# Patient Record
Sex: Female | Born: 1980
Health system: Southern US, Community
[De-identification: ages and names within clinical notes are randomized; demographics above are authoritative.]

## PROBLEM LIST (undated history)

## (undated) DIAGNOSIS — N83202 Unspecified ovarian cyst, left side: Secondary | ICD-10-CM

## (undated) DIAGNOSIS — F32A Depression, unspecified: Secondary | ICD-10-CM

## (undated) DIAGNOSIS — F4310 Post-traumatic stress disorder, unspecified: Secondary | ICD-10-CM

## (undated) DIAGNOSIS — R519 Headache, unspecified: Secondary | ICD-10-CM

## (undated) DIAGNOSIS — N83201 Unspecified ovarian cyst, right side: Secondary | ICD-10-CM

## (undated) DIAGNOSIS — G8929 Other chronic pain: Secondary | ICD-10-CM

## (undated) DIAGNOSIS — F329 Major depressive disorder, single episode, unspecified: Secondary | ICD-10-CM

## (undated) DIAGNOSIS — K859 Acute pancreatitis without necrosis or infection, unspecified: Secondary | ICD-10-CM

## (undated) DIAGNOSIS — R51 Headache: Secondary | ICD-10-CM

## (undated) DIAGNOSIS — F419 Anxiety disorder, unspecified: Secondary | ICD-10-CM

## (undated) DIAGNOSIS — J45909 Unspecified asthma, uncomplicated: Secondary | ICD-10-CM

## (undated) DIAGNOSIS — Z973 Presence of spectacles and contact lenses: Secondary | ICD-10-CM

## (undated) DIAGNOSIS — E039 Hypothyroidism, unspecified: Secondary | ICD-10-CM

## (undated) HISTORY — DX: Hypothyroidism, unspecified: E03.9

## (undated) HISTORY — DX: Anxiety disorder, unspecified: F41.9

## (undated) HISTORY — PX: ABDOMINAL SURGERY: SHX537

## (undated) HISTORY — PX: DECUBITUS ULCER EXCISION: SHX1443

## (undated) HISTORY — DX: Unspecified ovarian cyst, right side: N83.202

## (undated) HISTORY — DX: Headache: R51

## (undated) HISTORY — DX: Unspecified ovarian cyst, right side: N83.201

## (undated) HISTORY — DX: Depression, unspecified: F32.A

## (undated) HISTORY — DX: Acute pancreatitis without necrosis or infection, unspecified: K85.90

## (undated) HISTORY — PX: DILATION AND CURETTAGE OF UTERUS: SHX78

## (undated) HISTORY — DX: Other chronic pain: G89.29

## (undated) HISTORY — PX: REVISION GASTRIC RESTRICTIVE PROCEDURE FOR MORBID OBESITY: SUR1273

## (undated) HISTORY — DX: Post-traumatic stress disorder, unspecified: F43.10

## (undated) HISTORY — PX: GASTRIC BYPASS: SHX52

## (undated) HISTORY — PX: CHOLECYSTECTOMY: SHX55

## (undated) HISTORY — DX: Major depressive disorder, single episode, unspecified: F32.9

## (undated) HISTORY — DX: Headache, unspecified: R51.9

---

## 2011-02-18 DIAGNOSIS — R69 Illness, unspecified: Secondary | ICD-10-CM | POA: Insufficient documentation

## 2011-02-18 DIAGNOSIS — Z833 Family history of diabetes mellitus: Secondary | ICD-10-CM | POA: Insufficient documentation

## 2011-02-18 DIAGNOSIS — E668 Other obesity: Secondary | ICD-10-CM | POA: Insufficient documentation

## 2011-02-18 DIAGNOSIS — Z8249 Family history of ischemic heart disease and other diseases of the circulatory system: Secondary | ICD-10-CM | POA: Insufficient documentation

## 2011-02-18 DIAGNOSIS — N949 Unspecified condition associated with female genital organs and menstrual cycle: Secondary | ICD-10-CM | POA: Insufficient documentation

## 2011-02-18 DIAGNOSIS — I10 Essential (primary) hypertension: Secondary | ICD-10-CM | POA: Insufficient documentation

## 2011-02-18 DIAGNOSIS — F17200 Nicotine dependence, unspecified, uncomplicated: Secondary | ICD-10-CM | POA: Insufficient documentation

## 2011-02-18 DIAGNOSIS — Z803 Family history of malignant neoplasm of breast: Secondary | ICD-10-CM | POA: Insufficient documentation

## 2011-02-18 DIAGNOSIS — F32A Depression, unspecified: Secondary | ICD-10-CM | POA: Insufficient documentation

## 2011-02-18 DIAGNOSIS — Z720 Tobacco use: Secondary | ICD-10-CM | POA: Insufficient documentation

## 2011-02-18 DIAGNOSIS — IMO0002 Reserved for concepts with insufficient information to code with codable children: Secondary | ICD-10-CM | POA: Insufficient documentation

## 2011-02-18 DIAGNOSIS — F329 Major depressive disorder, single episode, unspecified: Secondary | ICD-10-CM | POA: Insufficient documentation

## 2011-06-19 DIAGNOSIS — R509 Fever, unspecified: Secondary | ICD-10-CM | POA: Insufficient documentation

## 2011-06-19 DIAGNOSIS — J159 Unspecified bacterial pneumonia: Secondary | ICD-10-CM | POA: Insufficient documentation

## 2011-10-17 DIAGNOSIS — Z9889 Other specified postprocedural states: Secondary | ICD-10-CM | POA: Insufficient documentation

## 2011-12-23 DIAGNOSIS — K859 Acute pancreatitis without necrosis or infection, unspecified: Secondary | ICD-10-CM | POA: Insufficient documentation

## 2012-01-15 DIAGNOSIS — R110 Nausea: Secondary | ICD-10-CM | POA: Insufficient documentation

## 2012-02-18 DIAGNOSIS — R1013 Epigastric pain: Secondary | ICD-10-CM | POA: Insufficient documentation

## 2012-04-06 DIAGNOSIS — F411 Generalized anxiety disorder: Secondary | ICD-10-CM | POA: Insufficient documentation

## 2012-04-06 DIAGNOSIS — Z9889 Other specified postprocedural states: Secondary | ICD-10-CM | POA: Insufficient documentation

## 2012-04-29 DIAGNOSIS — K289 Gastrojejunal ulcer, unspecified as acute or chronic, without hemorrhage or perforation: Secondary | ICD-10-CM | POA: Insufficient documentation

## 2013-10-13 ENCOUNTER — Emergency Department: Payer: Self-pay | Admitting: Emergency Medicine

## 2013-10-13 LAB — CBC
HCT: 34.5 % — ABNORMAL LOW (ref 35.0–47.0)
HGB: 10.9 g/dL — AB (ref 12.0–16.0)
MCH: 21.8 pg — AB (ref 26.0–34.0)
MCHC: 31.6 g/dL — ABNORMAL LOW (ref 32.0–36.0)
MCV: 69 fL — ABNORMAL LOW (ref 80–100)
Platelet: 193 10*3/uL (ref 150–440)
RBC: 5.01 10*6/uL (ref 3.80–5.20)
RDW: 17.2 % — AB (ref 11.5–14.5)
WBC: 6.7 10*3/uL (ref 3.6–11.0)

## 2013-10-13 LAB — URINALYSIS, COMPLETE
BACTERIA: NONE SEEN
Bilirubin,UR: NEGATIVE
Blood: NEGATIVE
GLUCOSE, UR: NEGATIVE mg/dL (ref 0–75)
Leukocyte Esterase: NEGATIVE
Nitrite: NEGATIVE
Ph: 5 (ref 4.5–8.0)
Protein: 30
RBC,UR: 1 /HPF (ref 0–5)
SPECIFIC GRAVITY: 1.035 (ref 1.003–1.030)
Squamous Epithelial: 6

## 2013-10-13 LAB — COMPREHENSIVE METABOLIC PANEL
ALBUMIN: 4.1 g/dL (ref 3.4–5.0)
ALT: 81 U/L — AB (ref 12–78)
AST: 38 U/L — AB (ref 15–37)
Alkaline Phosphatase: 124 U/L — ABNORMAL HIGH
Anion Gap: 5 — ABNORMAL LOW (ref 7–16)
BILIRUBIN TOTAL: 0.4 mg/dL (ref 0.2–1.0)
BUN: 9 mg/dL (ref 7–18)
CHLORIDE: 107 mmol/L (ref 98–107)
CO2: 28 mmol/L (ref 21–32)
Calcium, Total: 8.9 mg/dL (ref 8.5–10.1)
Creatinine: 0.73 mg/dL (ref 0.60–1.30)
EGFR (Non-African Amer.): >60
GLUCOSE: 81 mg/dL (ref 65–99)
Osmolality: 277 (ref 275–301)
POTASSIUM: 4.1 mmol/L (ref 3.5–5.1)
Sodium: 140 mmol/L (ref 136–145)
Total Protein: 7.5 g/dL (ref 6.4–8.2)

## 2013-10-13 LAB — LIPASE, BLOOD: Lipase: 606 U/L — ABNORMAL HIGH (ref 73–393)

## 2013-10-13 LAB — PREGNANCY, URINE: Pregnancy Test, Urine: NEGATIVE m[IU]/mL

## 2013-10-31 ENCOUNTER — Emergency Department: Payer: Self-pay | Admitting: Emergency Medicine

## 2013-10-31 LAB — URINALYSIS, COMPLETE
BACTERIA: NONE SEEN
Bilirubin,UR: NEGATIVE
Blood: NEGATIVE
GLUCOSE, UR: NEGATIVE mg/dL (ref 0–75)
Ketone: NEGATIVE
Leukocyte Esterase: NEGATIVE
Nitrite: NEGATIVE
Ph: 5 (ref 4.5–8.0)
Protein: NEGATIVE
RBC, UR: NONE SEEN /HPF (ref 0–5)
Specific Gravity: 1.017 (ref 1.003–1.030)
Squamous Epithelial: 8
WBC UR: 2 /HPF (ref 0–5)

## 2013-10-31 LAB — COMPREHENSIVE METABOLIC PANEL
ALK PHOS: 88 U/L
ANION GAP: 4 — AB (ref 7–16)
AST: 26 U/L (ref 15–37)
Albumin: 3.9 g/dL (ref 3.4–5.0)
BILIRUBIN TOTAL: 0.3 mg/dL (ref 0.2–1.0)
BUN: 8 mg/dL (ref 7–18)
CALCIUM: 9.1 mg/dL (ref 8.5–10.1)
CHLORIDE: 108 mmol/L — AB (ref 98–107)
CO2: 25 mmol/L (ref 21–32)
Creatinine: 0.84 mg/dL (ref 0.60–1.30)
EGFR (African American): >60
Glucose: 87 mg/dL (ref 65–99)
Osmolality: 272 (ref 275–301)
Potassium: 4.5 mmol/L (ref 3.5–5.1)
SGPT (ALT): 41 U/L (ref 12–78)
Sodium: 137 mmol/L (ref 136–145)
Total Protein: 7.8 g/dL (ref 6.4–8.2)

## 2013-10-31 LAB — CBC WITH DIFFERENTIAL/PLATELET
Basophil #: 0.1 10*3/uL (ref 0.0–0.1)
Basophil %: 1.2 %
Eosinophil #: 0.1 10*3/uL (ref 0.0–0.7)
Eosinophil %: 1.7 %
HCT: 38.6 % (ref 35.0–47.0)
HGB: 12 g/dL (ref 12.0–16.0)
Lymphocyte #: 2.5 10*3/uL (ref 1.0–3.6)
Lymphocyte %: 37 %
MCH: 21.4 pg — ABNORMAL LOW (ref 26.0–34.0)
MCHC: 31.1 g/dL — ABNORMAL LOW (ref 32.0–36.0)
MCV: 69 fL — ABNORMAL LOW (ref 80–100)
Monocyte #: 0.3 x10 3/mm (ref 0.2–0.9)
Monocyte %: 5 %
Neutrophil #: 3.7 10*3/uL (ref 1.4–6.5)
Neutrophil %: 55.1 %
Platelet: 256 10*3/uL (ref 150–440)
RBC: 5.6 10*6/uL — AB (ref 3.80–5.20)
RDW: 16.8 % — AB (ref 11.5–14.5)
WBC: 6.6 10*3/uL (ref 3.6–11.0)

## 2013-10-31 LAB — LIPASE, BLOOD: LIPASE: 434 U/L — AB (ref 73–393)

## 2013-10-31 LAB — HCG, QUANTITATIVE, PREGNANCY: Beta Hcg, Quant.: <1 m[IU]/mL — ABNORMAL LOW

## 2013-11-30 ENCOUNTER — Ambulatory Visit: Payer: Self-pay | Admitting: Gastroenterology

## 2014-02-13 DIAGNOSIS — K30 Functional dyspepsia: Secondary | ICD-10-CM | POA: Insufficient documentation

## 2014-02-13 DIAGNOSIS — R1084 Generalized abdominal pain: Secondary | ICD-10-CM | POA: Diagnosis not present

## 2014-02-13 DIAGNOSIS — R197 Diarrhea, unspecified: Secondary | ICD-10-CM | POA: Diagnosis not present

## 2014-02-13 DIAGNOSIS — K8689 Other specified diseases of pancreas: Secondary | ICD-10-CM | POA: Diagnosis not present

## 2014-02-13 DIAGNOSIS — R109 Unspecified abdominal pain: Secondary | ICD-10-CM

## 2014-02-13 DIAGNOSIS — K3189 Other diseases of stomach and duodenum: Secondary | ICD-10-CM | POA: Diagnosis not present

## 2014-02-13 DIAGNOSIS — D649 Anemia, unspecified: Secondary | ICD-10-CM | POA: Diagnosis not present

## 2014-02-13 DIAGNOSIS — G8929 Other chronic pain: Secondary | ICD-10-CM | POA: Insufficient documentation

## 2014-02-13 DIAGNOSIS — K529 Noninfective gastroenteritis and colitis, unspecified: Secondary | ICD-10-CM | POA: Insufficient documentation

## 2014-02-13 DIAGNOSIS — K869 Disease of pancreas, unspecified: Secondary | ICD-10-CM | POA: Insufficient documentation

## 2014-02-16 DIAGNOSIS — D649 Anemia, unspecified: Secondary | ICD-10-CM | POA: Diagnosis not present

## 2014-02-16 DIAGNOSIS — K8689 Other specified diseases of pancreas: Secondary | ICD-10-CM | POA: Diagnosis not present

## 2014-02-16 DIAGNOSIS — R1084 Generalized abdominal pain: Secondary | ICD-10-CM | POA: Diagnosis not present

## 2014-02-16 DIAGNOSIS — R197 Diarrhea, unspecified: Secondary | ICD-10-CM | POA: Diagnosis not present

## 2014-02-16 DIAGNOSIS — K3189 Other diseases of stomach and duodenum: Secondary | ICD-10-CM | POA: Diagnosis not present

## 2014-02-23 ENCOUNTER — Ambulatory Visit: Payer: Self-pay | Admitting: Gastroenterology

## 2014-02-23 DIAGNOSIS — R197 Diarrhea, unspecified: Secondary | ICD-10-CM | POA: Diagnosis not present

## 2014-02-23 DIAGNOSIS — E278 Other specified disorders of adrenal gland: Secondary | ICD-10-CM | POA: Diagnosis not present

## 2014-02-23 DIAGNOSIS — R109 Unspecified abdominal pain: Secondary | ICD-10-CM | POA: Diagnosis not present

## 2014-02-23 DIAGNOSIS — D3500 Benign neoplasm of unspecified adrenal gland: Secondary | ICD-10-CM | POA: Diagnosis not present

## 2014-02-27 ENCOUNTER — Ambulatory Visit: Payer: Self-pay | Admitting: Gastroenterology

## 2014-02-27 DIAGNOSIS — Z9884 Bariatric surgery status: Secondary | ICD-10-CM | POA: Diagnosis not present

## 2014-02-27 DIAGNOSIS — F341 Dysthymic disorder: Secondary | ICD-10-CM | POA: Diagnosis not present

## 2014-02-27 DIAGNOSIS — K5730 Diverticulosis of large intestine without perforation or abscess without bleeding: Secondary | ICD-10-CM | POA: Diagnosis not present

## 2014-02-27 DIAGNOSIS — R197 Diarrhea, unspecified: Secondary | ICD-10-CM | POA: Diagnosis not present

## 2014-02-27 DIAGNOSIS — K289 Gastrojejunal ulcer, unspecified as acute or chronic, without hemorrhage or perforation: Secondary | ICD-10-CM | POA: Diagnosis not present

## 2014-02-27 DIAGNOSIS — R1084 Generalized abdominal pain: Secondary | ICD-10-CM | POA: Diagnosis not present

## 2014-02-27 DIAGNOSIS — K208 Other esophagitis without bleeding: Secondary | ICD-10-CM | POA: Diagnosis not present

## 2014-02-27 DIAGNOSIS — Z8711 Personal history of peptic ulcer disease: Secondary | ICD-10-CM | POA: Diagnosis not present

## 2014-02-27 DIAGNOSIS — Q438 Other specified congenital malformations of intestine: Secondary | ICD-10-CM | POA: Diagnosis not present

## 2014-02-27 DIAGNOSIS — K209 Esophagitis, unspecified without bleeding: Secondary | ICD-10-CM | POA: Diagnosis not present

## 2014-02-27 DIAGNOSIS — Z79899 Other long term (current) drug therapy: Secondary | ICD-10-CM | POA: Diagnosis not present

## 2014-02-27 DIAGNOSIS — K3189 Other diseases of stomach and duodenum: Secondary | ICD-10-CM | POA: Diagnosis not present

## 2014-02-27 DIAGNOSIS — Z8371 Family history of colonic polyps: Secondary | ICD-10-CM | POA: Diagnosis not present

## 2014-02-27 DIAGNOSIS — Z87891 Personal history of nicotine dependence: Secondary | ICD-10-CM | POA: Diagnosis not present

## 2014-02-27 DIAGNOSIS — Z885 Allergy status to narcotic agent status: Secondary | ICD-10-CM | POA: Diagnosis not present

## 2014-02-27 DIAGNOSIS — R1013 Epigastric pain: Secondary | ICD-10-CM | POA: Diagnosis not present

## 2014-03-01 LAB — PATHOLOGY REPORT

## 2014-03-15 DIAGNOSIS — M549 Dorsalgia, unspecified: Secondary | ICD-10-CM | POA: Diagnosis not present

## 2014-03-15 DIAGNOSIS — K861 Other chronic pancreatitis: Secondary | ICD-10-CM | POA: Diagnosis not present

## 2014-03-15 DIAGNOSIS — F17200 Nicotine dependence, unspecified, uncomplicated: Secondary | ICD-10-CM | POA: Diagnosis not present

## 2014-03-15 DIAGNOSIS — K279 Peptic ulcer, site unspecified, unspecified as acute or chronic, without hemorrhage or perforation: Secondary | ICD-10-CM | POA: Diagnosis not present

## 2014-03-15 DIAGNOSIS — F4310 Post-traumatic stress disorder, unspecified: Secondary | ICD-10-CM | POA: Diagnosis not present

## 2014-03-15 DIAGNOSIS — R5381 Other malaise: Secondary | ICD-10-CM | POA: Diagnosis not present

## 2014-03-15 DIAGNOSIS — R109 Unspecified abdominal pain: Secondary | ICD-10-CM | POA: Diagnosis not present

## 2014-03-15 DIAGNOSIS — R5383 Other fatigue: Secondary | ICD-10-CM | POA: Diagnosis not present

## 2014-03-17 DIAGNOSIS — D518 Other vitamin B12 deficiency anemias: Secondary | ICD-10-CM | POA: Diagnosis not present

## 2014-03-17 DIAGNOSIS — E559 Vitamin D deficiency, unspecified: Secondary | ICD-10-CM | POA: Diagnosis not present

## 2014-03-17 DIAGNOSIS — R5383 Other fatigue: Secondary | ICD-10-CM | POA: Diagnosis not present

## 2014-03-17 DIAGNOSIS — R5381 Other malaise: Secondary | ICD-10-CM | POA: Diagnosis not present

## 2014-03-17 DIAGNOSIS — Z7721 Contact with and (suspected) exposure to potentially hazardous body fluids: Secondary | ICD-10-CM | POA: Diagnosis not present

## 2014-04-10 DIAGNOSIS — Z7721 Contact with and (suspected) exposure to potentially hazardous body fluids: Secondary | ICD-10-CM | POA: Diagnosis not present

## 2014-04-10 DIAGNOSIS — Z0000 Encounter for general adult medical examination without abnormal findings: Secondary | ICD-10-CM | POA: Diagnosis not present

## 2014-04-10 DIAGNOSIS — R5381 Other malaise: Secondary | ICD-10-CM | POA: Diagnosis not present

## 2014-04-10 DIAGNOSIS — F17200 Nicotine dependence, unspecified, uncomplicated: Secondary | ICD-10-CM | POA: Diagnosis not present

## 2014-04-10 DIAGNOSIS — R300 Dysuria: Secondary | ICD-10-CM | POA: Diagnosis not present

## 2014-04-10 DIAGNOSIS — R5383 Other fatigue: Secondary | ICD-10-CM | POA: Diagnosis not present

## 2014-04-10 DIAGNOSIS — F4310 Post-traumatic stress disorder, unspecified: Secondary | ICD-10-CM | POA: Diagnosis not present

## 2014-04-10 DIAGNOSIS — K279 Peptic ulcer, site unspecified, unspecified as acute or chronic, without hemorrhage or perforation: Secondary | ICD-10-CM | POA: Diagnosis not present

## 2014-04-10 DIAGNOSIS — K861 Other chronic pancreatitis: Secondary | ICD-10-CM | POA: Diagnosis not present

## 2014-04-10 DIAGNOSIS — R109 Unspecified abdominal pain: Secondary | ICD-10-CM | POA: Diagnosis not present

## 2014-04-13 DIAGNOSIS — K283 Acute gastrojejunal ulcer without hemorrhage or perforation: Secondary | ICD-10-CM | POA: Diagnosis not present

## 2014-04-19 ENCOUNTER — Ambulatory Visit: Payer: Self-pay | Admitting: Pain Medicine

## 2014-04-19 DIAGNOSIS — IMO0002 Reserved for concepts with insufficient information to code with codable children: Secondary | ICD-10-CM | POA: Diagnosis not present

## 2014-04-19 DIAGNOSIS — R109 Unspecified abdominal pain: Secondary | ICD-10-CM | POA: Diagnosis not present

## 2014-04-19 DIAGNOSIS — M549 Dorsalgia, unspecified: Secondary | ICD-10-CM | POA: Diagnosis not present

## 2014-04-19 DIAGNOSIS — R079 Chest pain, unspecified: Secondary | ICD-10-CM | POA: Diagnosis not present

## 2014-04-19 DIAGNOSIS — Z79899 Other long term (current) drug therapy: Secondary | ICD-10-CM | POA: Diagnosis not present

## 2014-04-19 DIAGNOSIS — G894 Chronic pain syndrome: Secondary | ICD-10-CM | POA: Diagnosis not present

## 2014-04-25 DIAGNOSIS — K283 Acute gastrojejunal ulcer without hemorrhage or perforation: Secondary | ICD-10-CM | POA: Diagnosis not present

## 2014-05-09 DIAGNOSIS — F4310 Post-traumatic stress disorder, unspecified: Secondary | ICD-10-CM | POA: Diagnosis not present

## 2014-05-10 DIAGNOSIS — Z01818 Encounter for other preprocedural examination: Secondary | ICD-10-CM | POA: Diagnosis not present

## 2014-05-17 DIAGNOSIS — Z8711 Personal history of peptic ulcer disease: Secondary | ICD-10-CM | POA: Diagnosis not present

## 2014-05-17 DIAGNOSIS — K259 Gastric ulcer, unspecified as acute or chronic, without hemorrhage or perforation: Secondary | ICD-10-CM | POA: Diagnosis not present

## 2014-05-17 DIAGNOSIS — K922 Gastrointestinal hemorrhage, unspecified: Secondary | ICD-10-CM | POA: Diagnosis not present

## 2014-05-17 DIAGNOSIS — K287 Chronic gastrojejunal ulcer without hemorrhage or perforation: Secondary | ICD-10-CM | POA: Diagnosis not present

## 2014-05-17 DIAGNOSIS — R000 Tachycardia, unspecified: Secondary | ICD-10-CM | POA: Diagnosis not present

## 2014-05-17 DIAGNOSIS — Z9884 Bariatric surgery status: Secondary | ICD-10-CM | POA: Diagnosis not present

## 2014-05-17 DIAGNOSIS — K909 Intestinal malabsorption, unspecified: Secondary | ICD-10-CM | POA: Diagnosis present

## 2014-05-17 DIAGNOSIS — K284 Chronic or unspecified gastrojejunal ulcer with hemorrhage: Secondary | ICD-10-CM | POA: Diagnosis present

## 2014-05-17 DIAGNOSIS — Z9049 Acquired absence of other specified parts of digestive tract: Secondary | ICD-10-CM | POA: Diagnosis present

## 2014-05-17 DIAGNOSIS — Z8701 Personal history of pneumonia (recurrent): Secondary | ICD-10-CM | POA: Diagnosis not present

## 2014-05-17 DIAGNOSIS — K920 Hematemesis: Secondary | ICD-10-CM | POA: Diagnosis not present

## 2014-05-17 DIAGNOSIS — Z818 Family history of other mental and behavioral disorders: Secondary | ICD-10-CM | POA: Diagnosis not present

## 2014-05-17 DIAGNOSIS — Z87891 Personal history of nicotine dependence: Secondary | ICD-10-CM | POA: Diagnosis not present

## 2014-05-17 DIAGNOSIS — F4310 Post-traumatic stress disorder, unspecified: Secondary | ICD-10-CM | POA: Diagnosis present

## 2014-05-17 DIAGNOSIS — D62 Acute posthemorrhagic anemia: Secondary | ICD-10-CM | POA: Diagnosis not present

## 2014-05-17 DIAGNOSIS — D509 Iron deficiency anemia, unspecified: Secondary | ICD-10-CM | POA: Diagnosis not present

## 2014-05-17 DIAGNOSIS — D500 Iron deficiency anemia secondary to blood loss (chronic): Secondary | ICD-10-CM | POA: Diagnosis not present

## 2014-05-17 DIAGNOSIS — K219 Gastro-esophageal reflux disease without esophagitis: Secondary | ICD-10-CM | POA: Diagnosis not present

## 2014-05-17 DIAGNOSIS — K9161 Intraoperative hemorrhage and hematoma of a digestive system organ or structure complicating a digestive sytem procedure: Secondary | ICD-10-CM | POA: Diagnosis not present

## 2014-05-30 DIAGNOSIS — F4310 Post-traumatic stress disorder, unspecified: Secondary | ICD-10-CM | POA: Diagnosis not present

## 2014-06-13 DIAGNOSIS — F4310 Post-traumatic stress disorder, unspecified: Secondary | ICD-10-CM | POA: Diagnosis not present

## 2014-06-22 DIAGNOSIS — Z9884 Bariatric surgery status: Secondary | ICD-10-CM | POA: Diagnosis not present

## 2014-06-23 DIAGNOSIS — R1084 Generalized abdominal pain: Secondary | ICD-10-CM | POA: Diagnosis not present

## 2014-06-23 DIAGNOSIS — K859 Acute pancreatitis, unspecified: Secondary | ICD-10-CM | POA: Diagnosis not present

## 2014-06-23 DIAGNOSIS — R109 Unspecified abdominal pain: Secondary | ICD-10-CM | POA: Diagnosis not present

## 2014-06-23 DIAGNOSIS — F4310 Post-traumatic stress disorder, unspecified: Secondary | ICD-10-CM | POA: Diagnosis present

## 2014-06-23 DIAGNOSIS — K861 Other chronic pancreatitis: Secondary | ICD-10-CM | POA: Diagnosis not present

## 2014-06-23 DIAGNOSIS — R1310 Dysphagia, unspecified: Secondary | ICD-10-CM | POA: Diagnosis not present

## 2014-06-23 DIAGNOSIS — K219 Gastro-esophageal reflux disease without esophagitis: Secondary | ICD-10-CM | POA: Diagnosis present

## 2014-06-23 DIAGNOSIS — Z87891 Personal history of nicotine dependence: Secondary | ICD-10-CM | POA: Diagnosis not present

## 2014-06-23 DIAGNOSIS — R627 Adult failure to thrive: Secondary | ICD-10-CM | POA: Diagnosis present

## 2014-06-23 DIAGNOSIS — Z9884 Bariatric surgery status: Secondary | ICD-10-CM | POA: Diagnosis not present

## 2014-06-23 DIAGNOSIS — Z8701 Personal history of pneumonia (recurrent): Secondary | ICD-10-CM | POA: Diagnosis not present

## 2014-06-23 DIAGNOSIS — Z6820 Body mass index (BMI) 20.0-20.9, adult: Secondary | ICD-10-CM | POA: Diagnosis not present

## 2014-06-23 DIAGNOSIS — Z9049 Acquired absence of other specified parts of digestive tract: Secondary | ICD-10-CM | POA: Diagnosis present

## 2014-06-23 DIAGNOSIS — E860 Dehydration: Secondary | ICD-10-CM | POA: Diagnosis not present

## 2014-06-23 DIAGNOSIS — Z8711 Personal history of peptic ulcer disease: Secondary | ICD-10-CM | POA: Diagnosis not present

## 2014-06-23 DIAGNOSIS — K9589 Other complications of other bariatric procedure: Secondary | ICD-10-CM | POA: Diagnosis present

## 2014-06-23 DIAGNOSIS — R1012 Left upper quadrant pain: Secondary | ICD-10-CM | POA: Diagnosis not present

## 2014-06-23 DIAGNOSIS — R110 Nausea: Secondary | ICD-10-CM | POA: Diagnosis not present

## 2014-06-23 DIAGNOSIS — E43 Unspecified severe protein-calorie malnutrition: Secondary | ICD-10-CM | POA: Diagnosis present

## 2014-06-23 DIAGNOSIS — R197 Diarrhea, unspecified: Secondary | ICD-10-CM | POA: Diagnosis not present

## 2014-06-23 DIAGNOSIS — R1110 Vomiting, unspecified: Secondary | ICD-10-CM | POA: Diagnosis not present

## 2014-06-23 DIAGNOSIS — K9189 Other postprocedural complications and disorders of digestive system: Secondary | ICD-10-CM | POA: Diagnosis not present

## 2014-06-24 DIAGNOSIS — R1310 Dysphagia, unspecified: Secondary | ICD-10-CM | POA: Diagnosis not present

## 2014-07-07 ENCOUNTER — Emergency Department: Payer: Self-pay | Admitting: Emergency Medicine

## 2014-07-07 DIAGNOSIS — Z0389 Encounter for observation for other suspected diseases and conditions ruled out: Secondary | ICD-10-CM | POA: Diagnosis not present

## 2014-07-07 DIAGNOSIS — R1013 Epigastric pain: Secondary | ICD-10-CM | POA: Diagnosis not present

## 2014-07-07 DIAGNOSIS — R109 Unspecified abdominal pain: Secondary | ICD-10-CM | POA: Diagnosis not present

## 2014-07-07 DIAGNOSIS — Z434 Encounter for attention to other artificial openings of digestive tract: Secondary | ICD-10-CM | POA: Diagnosis not present

## 2014-07-07 DIAGNOSIS — F410 Panic disorder [episodic paroxysmal anxiety] without agoraphobia: Secondary | ICD-10-CM | POA: Diagnosis present

## 2014-07-07 DIAGNOSIS — Z87891 Personal history of nicotine dependence: Secondary | ICD-10-CM | POA: Diagnosis not present

## 2014-07-07 DIAGNOSIS — R112 Nausea with vomiting, unspecified: Secondary | ICD-10-CM | POA: Diagnosis not present

## 2014-07-07 DIAGNOSIS — Z8711 Personal history of peptic ulcer disease: Secondary | ICD-10-CM | POA: Diagnosis not present

## 2014-07-07 DIAGNOSIS — Z9884 Bariatric surgery status: Secondary | ICD-10-CM | POA: Diagnosis not present

## 2014-07-07 DIAGNOSIS — K861 Other chronic pancreatitis: Secondary | ICD-10-CM | POA: Diagnosis present

## 2014-07-07 DIAGNOSIS — I82A11 Acute embolism and thrombosis of right axillary vein: Secondary | ICD-10-CM | POA: Diagnosis not present

## 2014-07-07 DIAGNOSIS — D649 Anemia, unspecified: Secondary | ICD-10-CM | POA: Diagnosis not present

## 2014-07-07 DIAGNOSIS — Z9049 Acquired absence of other specified parts of digestive tract: Secondary | ICD-10-CM | POA: Diagnosis present

## 2014-07-07 DIAGNOSIS — K858 Other acute pancreatitis: Secondary | ICD-10-CM | POA: Diagnosis not present

## 2014-07-07 DIAGNOSIS — I82621 Acute embolism and thrombosis of deep veins of right upper extremity: Secondary | ICD-10-CM | POA: Diagnosis present

## 2014-07-07 DIAGNOSIS — R1012 Left upper quadrant pain: Secondary | ICD-10-CM | POA: Diagnosis not present

## 2014-07-07 DIAGNOSIS — E869 Volume depletion, unspecified: Secondary | ICD-10-CM | POA: Diagnosis not present

## 2014-07-07 DIAGNOSIS — E860 Dehydration: Secondary | ICD-10-CM | POA: Diagnosis present

## 2014-07-07 DIAGNOSIS — R197 Diarrhea, unspecified: Secondary | ICD-10-CM | POA: Diagnosis not present

## 2014-07-07 DIAGNOSIS — K859 Acute pancreatitis, unspecified: Secondary | ICD-10-CM | POA: Diagnosis not present

## 2014-07-07 DIAGNOSIS — Z818 Family history of other mental and behavioral disorders: Secondary | ICD-10-CM | POA: Diagnosis not present

## 2014-07-07 DIAGNOSIS — R1110 Vomiting, unspecified: Secondary | ICD-10-CM | POA: Diagnosis not present

## 2014-07-07 DIAGNOSIS — E43 Unspecified severe protein-calorie malnutrition: Secondary | ICD-10-CM | POA: Diagnosis present

## 2014-07-07 DIAGNOSIS — R1084 Generalized abdominal pain: Secondary | ICD-10-CM | POA: Diagnosis not present

## 2014-07-07 DIAGNOSIS — K219 Gastro-esophageal reflux disease without esophagitis: Secondary | ICD-10-CM | POA: Diagnosis present

## 2014-07-07 DIAGNOSIS — Z8701 Personal history of pneumonia (recurrent): Secondary | ICD-10-CM | POA: Diagnosis not present

## 2014-07-07 DIAGNOSIS — F4310 Post-traumatic stress disorder, unspecified: Secondary | ICD-10-CM | POA: Diagnosis present

## 2014-07-07 DIAGNOSIS — R633 Feeding difficulties: Secondary | ICD-10-CM | POA: Diagnosis not present

## 2014-07-07 LAB — CBC WITH DIFFERENTIAL/PLATELET
Basophil #: 0 10*3/uL (ref 0.0–0.1)
Basophil %: 0.4 %
Eosinophil #: 0 10*3/uL (ref 0.0–0.7)
Eosinophil %: 0 %
HCT: 33.2 % — ABNORMAL LOW (ref 35.0–47.0)
HGB: 10.1 g/dL — ABNORMAL LOW (ref 12.0–16.0)
Lymphocyte #: 0.8 10*3/uL — ABNORMAL LOW (ref 1.0–3.6)
Lymphocyte %: 9.1 %
MCH: 19.7 pg — AB (ref 26.0–34.0)
MCHC: 30.5 g/dL — AB (ref 32.0–36.0)
MCV: 64 fL — ABNORMAL LOW (ref 80–100)
Monocyte #: 0.2 x10 3/mm (ref 0.2–0.9)
Monocyte %: 2.2 %
NEUTROS PCT: 88.3 %
Neutrophil #: 8.2 10*3/uL — ABNORMAL HIGH (ref 1.4–6.5)
Platelet: 249 10*3/uL (ref 150–440)
RBC: 5.15 10*6/uL (ref 3.80–5.20)
RDW: 22 % — AB (ref 11.5–14.5)
WBC: 9.2 10*3/uL (ref 3.6–11.0)

## 2014-07-07 LAB — COMPREHENSIVE METABOLIC PANEL
Albumin: 3.9 g/dL (ref 3.4–5.0)
Alkaline Phosphatase: 113 U/L
Anion Gap: 8 (ref 7–16)
BILIRUBIN TOTAL: 0.5 mg/dL (ref 0.2–1.0)
BUN: 10 mg/dL (ref 7–18)
CALCIUM: 9.5 mg/dL (ref 8.5–10.1)
CO2: 28 mmol/L (ref 21–32)
Chloride: 102 mmol/L (ref 98–107)
Creatinine: 0.72 mg/dL (ref 0.60–1.30)
EGFR (African American): >60
EGFR (Non-African Amer.): >60
Glucose: 156 mg/dL — ABNORMAL HIGH (ref 65–99)
Osmolality: 278 (ref 275–301)
POTASSIUM: 3.6 mmol/L (ref 3.5–5.1)
SGOT(AST): 13 U/L — ABNORMAL LOW (ref 15–37)
SGPT (ALT): 34 U/L
SODIUM: 138 mmol/L (ref 136–145)
Total Protein: 8.4 g/dL — ABNORMAL HIGH (ref 6.4–8.2)

## 2014-07-07 LAB — MAGNESIUM: Magnesium: 1.9 mg/dL

## 2014-07-07 LAB — LIPASE, BLOOD: Lipase: 2499 U/L — ABNORMAL HIGH (ref 73–393)

## 2014-07-07 LAB — HCG, QUANTITATIVE, PREGNANCY: Beta Hcg, Quant.: <1 m[IU]/mL — ABNORMAL LOW

## 2014-07-11 DIAGNOSIS — I82409 Acute embolism and thrombosis of unspecified deep veins of unspecified lower extremity: Secondary | ICD-10-CM

## 2014-07-11 HISTORY — DX: Acute embolism and thrombosis of unspecified deep veins of unspecified lower extremity: I82.409

## 2014-07-18 DIAGNOSIS — F4310 Post-traumatic stress disorder, unspecified: Secondary | ICD-10-CM | POA: Diagnosis not present

## 2014-07-20 DIAGNOSIS — I82401 Acute embolism and thrombosis of unspecified deep veins of right lower extremity: Secondary | ICD-10-CM | POA: Diagnosis not present

## 2014-07-20 DIAGNOSIS — I82409 Acute embolism and thrombosis of unspecified deep veins of unspecified lower extremity: Secondary | ICD-10-CM | POA: Insufficient documentation

## 2014-07-20 DIAGNOSIS — I82621 Acute embolism and thrombosis of deep veins of right upper extremity: Secondary | ICD-10-CM | POA: Diagnosis not present

## 2014-07-24 DIAGNOSIS — I82621 Acute embolism and thrombosis of deep veins of right upper extremity: Secondary | ICD-10-CM | POA: Diagnosis not present

## 2014-07-24 DIAGNOSIS — I82409 Acute embolism and thrombosis of unspecified deep veins of unspecified lower extremity: Secondary | ICD-10-CM | POA: Diagnosis not present

## 2014-07-27 DIAGNOSIS — I82621 Acute embolism and thrombosis of deep veins of right upper extremity: Secondary | ICD-10-CM | POA: Diagnosis not present

## 2014-07-27 DIAGNOSIS — I82409 Acute embolism and thrombosis of unspecified deep veins of unspecified lower extremity: Secondary | ICD-10-CM | POA: Diagnosis not present

## 2014-08-01 DIAGNOSIS — I82409 Acute embolism and thrombosis of unspecified deep veins of unspecified lower extremity: Secondary | ICD-10-CM | POA: Diagnosis not present

## 2014-08-07 DIAGNOSIS — I82409 Acute embolism and thrombosis of unspecified deep veins of unspecified lower extremity: Secondary | ICD-10-CM | POA: Diagnosis not present

## 2014-09-13 DIAGNOSIS — Z9884 Bariatric surgery status: Secondary | ICD-10-CM | POA: Diagnosis not present

## 2014-09-13 DIAGNOSIS — R1013 Epigastric pain: Secondary | ICD-10-CM | POA: Diagnosis not present

## 2014-09-13 DIAGNOSIS — K9413 Enterostomy malfunction: Secondary | ICD-10-CM | POA: Diagnosis not present

## 2014-09-13 DIAGNOSIS — Z87891 Personal history of nicotine dependence: Secondary | ICD-10-CM | POA: Diagnosis not present

## 2014-09-13 DIAGNOSIS — R112 Nausea with vomiting, unspecified: Secondary | ICD-10-CM | POA: Diagnosis not present

## 2014-09-13 DIAGNOSIS — K9403 Colostomy malfunction: Secondary | ICD-10-CM | POA: Diagnosis not present

## 2014-09-13 DIAGNOSIS — K861 Other chronic pancreatitis: Secondary | ICD-10-CM | POA: Diagnosis not present

## 2014-09-13 DIAGNOSIS — R630 Anorexia: Secondary | ICD-10-CM | POA: Diagnosis not present

## 2014-10-09 DIAGNOSIS — R1013 Epigastric pain: Secondary | ICD-10-CM | POA: Diagnosis not present

## 2014-10-09 DIAGNOSIS — M549 Dorsalgia, unspecified: Secondary | ICD-10-CM | POA: Diagnosis not present

## 2014-10-09 DIAGNOSIS — G894 Chronic pain syndrome: Secondary | ICD-10-CM | POA: Diagnosis not present

## 2014-10-09 DIAGNOSIS — K861 Other chronic pancreatitis: Secondary | ICD-10-CM | POA: Diagnosis not present

## 2014-10-18 DIAGNOSIS — M5417 Radiculopathy, lumbosacral region: Secondary | ICD-10-CM | POA: Diagnosis not present

## 2014-10-18 DIAGNOSIS — K861 Other chronic pancreatitis: Secondary | ICD-10-CM | POA: Diagnosis not present

## 2014-10-18 DIAGNOSIS — M5414 Radiculopathy, thoracic region: Secondary | ICD-10-CM | POA: Diagnosis not present

## 2014-10-18 DIAGNOSIS — R109 Unspecified abdominal pain: Secondary | ICD-10-CM | POA: Diagnosis not present

## 2014-10-18 DIAGNOSIS — G8929 Other chronic pain: Secondary | ICD-10-CM | POA: Diagnosis not present

## 2014-10-19 DIAGNOSIS — K861 Other chronic pancreatitis: Secondary | ICD-10-CM | POA: Diagnosis not present

## 2014-11-23 DIAGNOSIS — R1013 Epigastric pain: Secondary | ICD-10-CM | POA: Diagnosis not present

## 2014-11-23 DIAGNOSIS — R112 Nausea with vomiting, unspecified: Secondary | ICD-10-CM | POA: Diagnosis not present

## 2014-11-29 DIAGNOSIS — M5417 Radiculopathy, lumbosacral region: Secondary | ICD-10-CM | POA: Diagnosis not present

## 2014-11-29 DIAGNOSIS — R109 Unspecified abdominal pain: Secondary | ICD-10-CM | POA: Diagnosis not present

## 2014-11-29 DIAGNOSIS — K861 Other chronic pancreatitis: Secondary | ICD-10-CM | POA: Diagnosis not present

## 2014-11-29 DIAGNOSIS — M5414 Radiculopathy, thoracic region: Secondary | ICD-10-CM | POA: Diagnosis not present

## 2014-11-29 DIAGNOSIS — G8929 Other chronic pain: Secondary | ICD-10-CM | POA: Diagnosis not present

## 2014-12-07 DIAGNOSIS — K219 Gastro-esophageal reflux disease without esophagitis: Secondary | ICD-10-CM | POA: Diagnosis not present

## 2014-12-07 DIAGNOSIS — R110 Nausea: Secondary | ICD-10-CM | POA: Diagnosis not present

## 2014-12-07 DIAGNOSIS — R630 Anorexia: Secondary | ICD-10-CM | POA: Diagnosis not present

## 2014-12-07 DIAGNOSIS — R109 Unspecified abdominal pain: Secondary | ICD-10-CM | POA: Diagnosis not present

## 2014-12-07 DIAGNOSIS — K110 Atrophy of salivary gland: Secondary | ICD-10-CM | POA: Diagnosis not present

## 2014-12-28 DIAGNOSIS — K861 Other chronic pancreatitis: Secondary | ICD-10-CM | POA: Diagnosis not present

## 2015-01-01 DIAGNOSIS — G8929 Other chronic pain: Secondary | ICD-10-CM | POA: Diagnosis not present

## 2015-01-01 DIAGNOSIS — F329 Major depressive disorder, single episode, unspecified: Secondary | ICD-10-CM | POA: Diagnosis not present

## 2015-01-01 DIAGNOSIS — F411 Generalized anxiety disorder: Secondary | ICD-10-CM | POA: Diagnosis not present

## 2015-01-01 DIAGNOSIS — R109 Unspecified abdominal pain: Secondary | ICD-10-CM | POA: Diagnosis not present

## 2015-01-01 DIAGNOSIS — K861 Other chronic pancreatitis: Secondary | ICD-10-CM | POA: Diagnosis not present

## 2015-01-01 DIAGNOSIS — Z9884 Bariatric surgery status: Secondary | ICD-10-CM | POA: Diagnosis not present

## 2015-01-01 DIAGNOSIS — K869 Disease of pancreas, unspecified: Secondary | ICD-10-CM | POA: Diagnosis not present

## 2015-01-01 DIAGNOSIS — K529 Noninfective gastroenteritis and colitis, unspecified: Secondary | ICD-10-CM | POA: Diagnosis not present

## 2015-01-18 DIAGNOSIS — G8929 Other chronic pain: Secondary | ICD-10-CM | POA: Diagnosis not present

## 2015-01-18 DIAGNOSIS — R1013 Epigastric pain: Secondary | ICD-10-CM | POA: Diagnosis not present

## 2015-01-25 DIAGNOSIS — R109 Unspecified abdominal pain: Secondary | ICD-10-CM | POA: Diagnosis not present

## 2015-01-25 DIAGNOSIS — K861 Other chronic pancreatitis: Secondary | ICD-10-CM | POA: Diagnosis not present

## 2015-01-25 DIAGNOSIS — G8929 Other chronic pain: Secondary | ICD-10-CM | POA: Diagnosis not present

## 2015-01-25 DIAGNOSIS — Z79899 Other long term (current) drug therapy: Secondary | ICD-10-CM | POA: Diagnosis not present

## 2015-02-06 DIAGNOSIS — D509 Iron deficiency anemia, unspecified: Secondary | ICD-10-CM | POA: Diagnosis not present

## 2015-02-06 DIAGNOSIS — N921 Excessive and frequent menstruation with irregular cycle: Secondary | ICD-10-CM | POA: Diagnosis not present

## 2015-02-06 DIAGNOSIS — R51 Headache: Secondary | ICD-10-CM | POA: Diagnosis not present

## 2015-02-06 DIAGNOSIS — F331 Major depressive disorder, recurrent, moderate: Secondary | ICD-10-CM | POA: Diagnosis not present

## 2015-02-06 DIAGNOSIS — D259 Leiomyoma of uterus, unspecified: Secondary | ICD-10-CM | POA: Diagnosis not present

## 2015-02-08 DIAGNOSIS — N925 Other specified irregular menstruation: Secondary | ICD-10-CM | POA: Diagnosis not present

## 2015-02-08 DIAGNOSIS — N921 Excessive and frequent menstruation with irregular cycle: Secondary | ICD-10-CM | POA: Diagnosis not present

## 2015-02-08 DIAGNOSIS — R5383 Other fatigue: Secondary | ICD-10-CM | POA: Diagnosis not present

## 2015-03-08 DIAGNOSIS — N921 Excessive and frequent menstruation with irregular cycle: Secondary | ICD-10-CM | POA: Diagnosis not present

## 2015-03-09 DIAGNOSIS — D259 Leiomyoma of uterus, unspecified: Secondary | ICD-10-CM | POA: Diagnosis not present

## 2015-03-09 DIAGNOSIS — N921 Excessive and frequent menstruation with irregular cycle: Secondary | ICD-10-CM | POA: Diagnosis not present

## 2015-03-09 DIAGNOSIS — D509 Iron deficiency anemia, unspecified: Secondary | ICD-10-CM | POA: Diagnosis not present

## 2015-03-09 DIAGNOSIS — R5383 Other fatigue: Secondary | ICD-10-CM | POA: Diagnosis not present

## 2015-03-13 DIAGNOSIS — E278 Other specified disorders of adrenal gland: Secondary | ICD-10-CM | POA: Diagnosis not present

## 2015-03-13 DIAGNOSIS — E221 Hyperprolactinemia: Secondary | ICD-10-CM | POA: Diagnosis not present

## 2015-03-13 DIAGNOSIS — N8320 Unspecified ovarian cysts: Secondary | ICD-10-CM | POA: Diagnosis not present

## 2015-03-13 DIAGNOSIS — D509 Iron deficiency anemia, unspecified: Secondary | ICD-10-CM | POA: Diagnosis not present

## 2015-03-13 DIAGNOSIS — R51 Headache: Secondary | ICD-10-CM | POA: Diagnosis not present

## 2015-03-13 DIAGNOSIS — N921 Excessive and frequent menstruation with irregular cycle: Secondary | ICD-10-CM | POA: Diagnosis not present

## 2015-03-13 DIAGNOSIS — D519 Vitamin B12 deficiency anemia, unspecified: Secondary | ICD-10-CM | POA: Diagnosis not present

## 2015-03-15 ENCOUNTER — Other Ambulatory Visit: Payer: Self-pay | Admitting: Nurse Practitioner

## 2015-03-15 DIAGNOSIS — G8929 Other chronic pain: Secondary | ICD-10-CM | POA: Diagnosis not present

## 2015-03-15 DIAGNOSIS — R1084 Generalized abdominal pain: Secondary | ICD-10-CM

## 2015-03-15 DIAGNOSIS — R1013 Epigastric pain: Secondary | ICD-10-CM | POA: Diagnosis not present

## 2015-03-15 DIAGNOSIS — E221 Hyperprolactinemia: Secondary | ICD-10-CM

## 2015-03-23 ENCOUNTER — Ambulatory Visit
Admission: RE | Admit: 2015-03-23 | Discharge: 2015-03-23 | Disposition: A | Discharge status: Home / Self Care | Payer: Medicare Other | Source: Ambulatory Visit | Attending: Nurse Practitioner | Admitting: Nurse Practitioner

## 2015-03-23 DIAGNOSIS — I771 Stricture of artery: Secondary | ICD-10-CM | POA: Diagnosis not present

## 2015-03-23 DIAGNOSIS — E221 Hyperprolactinemia: Secondary | ICD-10-CM

## 2015-03-23 DIAGNOSIS — R51 Headache: Secondary | ICD-10-CM | POA: Insufficient documentation

## 2015-03-23 DIAGNOSIS — R42 Dizziness and giddiness: Secondary | ICD-10-CM | POA: Diagnosis not present

## 2015-03-23 MED ORDER — GADOBENATE DIMEGLUMINE 529 MG/ML IV SOLN
10.0000 mL | Freq: Once | INTRAVENOUS | Status: AC | PRN
  Administered 2015-03-23: 7 mL via INTRAVENOUS

## 2015-03-27 ENCOUNTER — Encounter: Payer: Self-pay | Admitting: Psychiatry

## 2015-03-27 ENCOUNTER — Ambulatory Visit (INDEPENDENT_AMBULATORY_CARE_PROVIDER_SITE_OTHER): Payer: Medicare Other | Admitting: Psychiatry

## 2015-03-27 ENCOUNTER — Ambulatory Visit
Admission: RE | Admit: 2015-03-27 | Discharge: 2015-03-27 | Disposition: A | Discharge status: Home / Self Care | Payer: Medicare Other | Source: Ambulatory Visit | Attending: Nurse Practitioner | Admitting: Nurse Practitioner

## 2015-03-27 DIAGNOSIS — Z9049 Acquired absence of other specified parts of digestive tract: Secondary | ICD-10-CM | POA: Insufficient documentation

## 2015-03-27 DIAGNOSIS — E278 Other specified disorders of adrenal gland: Secondary | ICD-10-CM | POA: Diagnosis not present

## 2015-03-27 DIAGNOSIS — R1084 Generalized abdominal pain: Secondary | ICD-10-CM | POA: Insufficient documentation

## 2015-03-27 DIAGNOSIS — R911 Solitary pulmonary nodule: Secondary | ICD-10-CM | POA: Diagnosis not present

## 2015-03-27 DIAGNOSIS — F4312 Post-traumatic stress disorder, chronic: Secondary | ICD-10-CM

## 2015-03-27 DIAGNOSIS — K6389 Other specified diseases of intestine: Secondary | ICD-10-CM | POA: Diagnosis not present

## 2015-03-27 DIAGNOSIS — F331 Major depressive disorder, recurrent, moderate: Secondary | ICD-10-CM | POA: Diagnosis not present

## 2015-03-27 MED ORDER — ESCITALOPRAM OXALATE 10 MG PO TABS: 10.0000 mg | ORAL_TABLET | Freq: Every day | ORAL | Status: DC

## 2015-03-27 MED ORDER — BUSPIRONE HCL 10 MG PO TABS: 10.0000 mg | ORAL_TABLET | Freq: Two times a day (BID) | ORAL | Status: DC

## 2015-03-27 MED ORDER — IOHEXOL 300 MG/ML  SOLN
100.0000 mL | Freq: Once | INTRAMUSCULAR | Status: AC | PRN
  Administered 2015-03-27: 100 mL via INTRAVENOUS
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
## 2015-03-29 ENCOUNTER — Encounter: Payer: Self-pay | Admitting: Oncology

## 2015-03-29 ENCOUNTER — Inpatient Hospital Stay: Payer: Medicare Other | Attending: Oncology | Admitting: Oncology

## 2015-03-29 VITALS — BP 131/89 | Wt 159.6 lb

## 2015-03-29 DIAGNOSIS — D509 Iron deficiency anemia, unspecified: Secondary | ICD-10-CM | POA: Insufficient documentation

## 2015-03-29 DIAGNOSIS — F418 Other specified anxiety disorders: Secondary | ICD-10-CM | POA: Diagnosis not present

## 2015-03-29 DIAGNOSIS — Z9884 Bariatric surgery status: Secondary | ICD-10-CM | POA: Diagnosis not present

## 2015-03-29 DIAGNOSIS — Z79899 Other long term (current) drug therapy: Secondary | ICD-10-CM

## 2015-03-29 DIAGNOSIS — E221 Hyperprolactinemia: Secondary | ICD-10-CM

## 2015-03-29 DIAGNOSIS — R5383 Other fatigue: Secondary | ICD-10-CM

## 2015-03-29 DIAGNOSIS — R531 Weakness: Secondary | ICD-10-CM | POA: Insufficient documentation

## 2015-03-29 DIAGNOSIS — N920 Excessive and frequent menstruation with regular cycle: Secondary | ICD-10-CM | POA: Insufficient documentation

## 2015-03-29 DIAGNOSIS — R1012 Left upper quadrant pain: Secondary | ICD-10-CM | POA: Diagnosis not present

## 2015-03-29 DIAGNOSIS — R51 Headache: Secondary | ICD-10-CM | POA: Diagnosis not present

## 2015-03-29 DIAGNOSIS — I771 Stricture of artery: Secondary | ICD-10-CM | POA: Insufficient documentation

## 2015-03-29 DIAGNOSIS — F17210 Nicotine dependence, cigarettes, uncomplicated: Secondary | ICD-10-CM | POA: Diagnosis not present

## 2015-03-30 ENCOUNTER — Ambulatory Visit (INDEPENDENT_AMBULATORY_CARE_PROVIDER_SITE_OTHER): Payer: Medicare Other | Admitting: Obstetrics and Gynecology

## 2015-03-30 ENCOUNTER — Other Ambulatory Visit: Payer: Self-pay | Admitting: Obstetrics and Gynecology

## 2015-03-30 ENCOUNTER — Encounter: Payer: Self-pay | Admitting: Obstetrics and Gynecology

## 2015-03-30 VITALS — BP 136/84 | HR 85 | Ht 72.0 in | Wt 161.8 lb

## 2015-03-30 DIAGNOSIS — N946 Dysmenorrhea, unspecified: Secondary | ICD-10-CM | POA: Diagnosis not present

## 2015-03-30 DIAGNOSIS — N921 Excessive and frequent menstruation with irregular cycle: Secondary | ICD-10-CM | POA: Diagnosis not present

## 2015-03-30 DIAGNOSIS — Z720 Tobacco use: Secondary | ICD-10-CM

## 2015-03-30 DIAGNOSIS — N8320 Unspecified ovarian cysts: Secondary | ICD-10-CM

## 2015-03-30 DIAGNOSIS — R102 Pelvic and perineal pain: Secondary | ICD-10-CM

## 2015-03-30 DIAGNOSIS — N83202 Unspecified ovarian cyst, left side: Secondary | ICD-10-CM

## 2015-03-30 DIAGNOSIS — R1010 Upper abdominal pain, unspecified: Secondary | ICD-10-CM | POA: Diagnosis not present

## 2015-03-30 DIAGNOSIS — F1220 Cannabis dependence, uncomplicated: Secondary | ICD-10-CM | POA: Diagnosis not present

## 2015-03-30 DIAGNOSIS — F1290 Cannabis use, unspecified, uncomplicated: Secondary | ICD-10-CM

## 2015-03-30 MED ORDER — MEDROXYPROGESTERONE ACETATE 150 MG/ML IM SUSP
150.0000 mg | Freq: Once | INTRAMUSCULAR | Status: AC
  Administered 2015-03-30: 150 mg via INTRAMUSCULAR

## 2015-03-30 MED ORDER — MEDROXYPROGESTERONE ACETATE 150 MG/ML IM SUSP: 150.0000 mg | INTRAMUSCULAR | Status: DC

## 2015-03-30 NOTE — Progress Notes (Signed)
Subjective:     Patient ID: Stacy Pineda, female   DOB: [birth year], 34 y.o.   MRN: [id number]  HPI Referred by PCP for follow-up and evaluation of left ovarian cyst found on CT scan and pelvic pain with menorrhagia and irregular cycles.  Review of Systems Review of Systems - General ROS: positive for  - fatigue Breast ROS: negative for breast lumps Gastrointestinal ROS: positive for - abdominal pain and gas/bloating Genito-Urinary ROS: positive for - dysmenorrhea, irregular/heavy menses and pelvic pain    Objective:   Physical Exam A&O x4 Well groomed female in mild distress Abdomen soft and nontender with normal BS x 4 quadrants Pelvic exam: normal external genitalia, vulva, vagina, cervix, uterus and adnexa, VULVA: normal appearing vulva with no masses, tenderness or lesions, VAGINA: normal appearing vagina with normal color and discharge, no lesions, CERVIX: normal appearing cervix without discharge or lesions, UTERUS: uterus is normal size, shape, consistency and nontender, ADNEXA: tenderness left, no masses, PAP: Pap smear done today.    Assessment:     Left ovarian cyst Menorrhagia with irregular menses pelvic pain Tobacco abuse     Plan:     Pap obtained- will repeat as findings indicated. Repeat pelvic ultrasound in 5-6 weeks, and to see me at same time.  counseled on all forms of hormonal suppression of ovarian cyst and regulate menstrual bleeding, not a good candidate for estrogen products due to smoking and age >82; Desires Depo- rx given and first injection given today.  RTC 12 weeks for next Depo.  Melody Trudee Kuster, CNM

## 2015-03-30 NOTE — Patient Instructions (Signed)
Thank you for enrolling in [facility]. Please follow the instructions below to securely access your online medical record. MyChart allows you to send messages to your doctor, view your test results, renew your prescriptions, schedule appointments, and more.  How Do I Sign Up? 1. In your Internet browser, go to [email or web address]. 2. Click on the New  User? link in the Sign In box.  3. Enter your MyChart Access Code exactly as it appears below. You will not need to use this code after you have completed the sign-up process. If you do not sign up before the expiration date, you must request a new code. MyChart Access Code: [email or web address] Expires: 05/14/2015  4:31 PM  4. Enter the last four digits of your Social Security Number (xxxx) and Date of Birth (mm/dd/yyyy) as indicated and click Next. You will be taken to the next sign-up page. 5. Create a MyChart ID. This will be your MyChart login ID and cannot be changed, so think of one that is secure and easy to remember. 6. Create a MyChart password. You can change your password at any time. 7. Enter your Password Reset Question and Answer and click Next. This can be used at a later time if you forget your password.  8. Select your communication preference, and if applicable enter your e-mail address. You will receive e-mail notification when new information is available in MyChart by choosing to receive e-mail notifications and filling in your e-mail. 9. Click Sign In. You can now view your medical record.   Additional Information If you have questions, you can email [email or web address] or call [phone] to talk to our [facility] staff. Remember, MyChart is NOT to be used for urgent needs. For medical emergencies, dial 911. Ovarian Cyst An ovarian cyst is a fluid-filled sac that forms on an ovary. The ovaries are small organs that produce eggs in women. Various types of cysts can form on the ovaries. Most are not  cancerous. Many do not cause problems, and they often go away on their own. Some may cause symptoms and require treatment. Common types of ovarian cysts include:  Functional cysts--These cysts may occur every month during the menstrual cycle. This is normal. The cysts usually go away with the next menstrual cycle if the woman does not get pregnant. Usually, there are no symptoms with a functional cyst.  Endometrioma cysts--These cysts form from the tissue that lines the uterus. They are also called "chocolate cysts" because they become filled with blood that turns brown. This type of cyst can cause pain in the lower abdomen during intercourse and with your menstrual period.  Cystadenoma cysts--This type develops from the cells on the outside of the ovary. These cysts can get very big and cause lower abdomen pain and pain with intercourse. This type of cyst can twist on itself, cut off its blood supply, and cause severe pain. It can also easily rupture and cause a lot of pain.  Dermoid cysts--This type of cyst is sometimes found in both ovaries. These cysts may contain different kinds of body tissue, such as skin, teeth, hair, or cartilage. They usually do not cause symptoms unless they get very big.  Theca lutein cysts--These cysts occur when too much of a certain hormone (human chorionic gonadotropin) is produced and overstimulates the ovaries to produce an egg. This is most common after procedures used to assist with the conception of a baby (in vitro fertilization). CAUSES   Fertility drugs can cause a  condition in which multiple large cysts are formed on the ovaries. This is called ovarian hyperstimulation syndrome.  A condition called polycystic ovary syndrome can cause hormonal imbalances that can lead to nonfunctional ovarian cysts. SIGNS AND SYMPTOMS  Many ovarian cysts do not cause symptoms. If symptoms are present, they may include:  Pelvic pain or pressure.  Pain in the lower  abdomen.  Pain during sexual intercourse.  Increasing girth (swelling) of the abdomen.  Abnormal menstrual periods.  Increasing pain with menstrual periods.  Stopping having menstrual periods without being pregnant. DIAGNOSIS  These cysts are commonly found during a routine or annual pelvic exam. Tests may be ordered to find out more about the cyst. These tests may include:  Ultrasound.  X-ray of the pelvis.  CT scan.  MRI.  Blood tests. TREATMENT  Many ovarian cysts go away on their own without treatment. Your health care provider may want to check your cyst regularly for 2-3 months to see if it changes. For women in menopause, it is particularly important to monitor a cyst closely because of the higher rate of ovarian cancer in menopausal women. When treatment is needed, it may include any of the following:  A procedure to drain the cyst (aspiration). This may be done using a long needle and ultrasound. It can also be done through a laparoscopic procedure. This involves using a thin, lighted tube with a tiny camera on the end (laparoscope) inserted through a small incision.  Surgery to remove the whole cyst. This may be done using laparoscopic surgery or an open surgery involving a larger incision in the lower abdomen.  Hormone treatment or birth control pills. These methods are sometimes used to help dissolve a cyst. HOME CARE INSTRUCTIONS   Only take over-the-counter or prescription medicines as directed by your health care provider.  Follow up with your health care provider as directed.  Get regular pelvic exams and Pap tests. SEEK MEDICAL CARE IF:   Your periods are late, irregular, or painful, or they stop.  Your pelvic pain or abdominal pain does not go away.  Your abdomen becomes larger or swollen.  You have pressure on your bladder or trouble emptying your bladder completely.  You have pain during sexual intercourse.  You have feelings of fullness,  pressure, or discomfort in your stomach.  You lose weight for no apparent reason.  You feel generally ill.  You become constipated.  You lose your appetite.  You develop acne.  You have an increase in body and facial hair.  You are gaining weight, without changing your exercise and eating habits.  You think you are pregnant. SEEK IMMEDIATE MEDICAL CARE IF:   You have increasing abdominal pain.  You feel sick to your stomach (nauseous), and you throw up (vomit).  You develop a fever that comes on suddenly.  You have abdominal pain during a bowel movement.  Your menstrual periods become heavier than usual. MAKE SURE YOU:  Understand these instructions.  Will watch your condition.  Will get help right away if you are not doing well or get worse. Document Released: 07/28/2005 Document Revised: 08/02/2013 Document Reviewed: 04/04/2013 [facility] Patient Information 2015 [place], [place]. This information is not intended to replace advice given to you by your health care provider. Make sure you discuss any questions you have with your health care provider.    Medroxyprogesterone injection [Contraceptive] What is this medicine? MEDROXYPROGESTERONE (me DROX ee proe JES te rone) contraceptive injections prevent pregnancy. They provide effective birth  control for 3 months. Depo-subQ Provera 104 is also used for treating pain related to endometriosis. This medicine may be used for other purposes; ask your health care provider or pharmacist if you have questions. COMMON BRAND NAME(S): Depo-Provera, Depo-subQ Provera 104 What should I tell my health care provider before I take this medicine? They need to know if you have any of these conditions: -frequently drink alcohol -asthma -blood vessel disease or a history of a blood clot in the lungs or legs -bone disease such as osteoporosis -breast cancer -diabetes -eating disorder (anorexia nervosa or bulimia) -high blood  pressure -HIV infection or AIDS -kidney disease -liver disease -mental depression -migraine -seizures (convulsions) -stroke -tobacco smoker -vaginal bleeding -an unusual or allergic reaction to medroxyprogesterone, other hormones, medicines, foods, dyes, or preservatives -pregnant or trying to get pregnant -breast-feeding How should I use this medicine? Depo-Provera Contraceptive injection is given into a muscle. Depo-subQ Provera 104 injection is given under the skin. These injections are given by a health care professional. You must not be pregnant before getting an injection. The injection is usually given during the first 5 days after the start of a menstrual period or 6 weeks after delivery of a baby. Talk to your pediatrician regarding the use of this medicine in children. Special care may be needed. These injections have been used in female children who have started having menstrual periods. Overdosage: If you think you have taken too much of this medicine contact a poison control center or emergency room at once. NOTE: This medicine is only for you. Do not share this medicine with others. What if I miss a dose? Try not to miss a dose. You must get an injection once every 3 months to maintain birth control. If you cannot keep an appointment, call and reschedule it. If you wait longer than 13 weeks between Depo-Provera contraceptive injections or longer than 14 weeks between Depo-subQ Provera 104 injections, you could get pregnant. Use another method for birth control if you miss your appointment. You may also need a pregnancy test before receiving another injection. What may interact with this medicine? Do not take this medicine with any of the following medications: -bosentan This medicine may also interact with the following medications: -aminoglutethimide -antibiotics or medicines for infections, especially rifampin, rifabutin, rifapentine, and  griseofulvin -aprepitant -barbiturate medicines such as phenobarbital or primidone -bexarotene -carbamazepine -medicines for seizures like ethotoin, felbamate, oxcarbazepine, phenytoin, topiramate -modafinil -St. John's wort This list may not describe all possible interactions. Give your health care provider a list of all the medicines, herbs, non-prescription drugs, or dietary supplements you use. Also tell them if you smoke, drink alcohol, or use illegal drugs. Some items may interact with your medicine. What should I watch for while using this medicine? This drug does not protect you against HIV infection (AIDS) or other sexually transmitted diseases. Use of this product may cause you to lose calcium from your bones. Loss of calcium may cause weak bones (osteoporosis). Only use this product for more than 2 years if other forms of birth control are not right for you. The longer you use this product for birth control the more likely you will be at risk for weak bones. Ask your health care professional how you can keep strong bones. You may have a change in bleeding pattern or irregular periods. Many females stop having periods while taking this drug. If you have received your injections on time, your chance of being pregnant is very low. If you  think you may be pregnant, see your health care professional as soon as possible. Tell your health care professional if you want to get pregnant within the next year. The effect of this medicine may last a long time after you get your last injection. What side effects may I notice from receiving this medicine? Side effects that you should report to your doctor or health care professional as soon as possible: -allergic reactions like skin rash, itching or hives, swelling of the face, lips, or tongue -breast tenderness or discharge -breathing problems -changes in vision -depression -feeling faint or lightheaded, falls -fever -pain in the abdomen, chest,  groin, or leg -problems with balance, talking, walking -unusually weak or tired -yellowing of the eyes or skin Side effects that usually do not require medical attention (report to your doctor or health care professional if they continue or are bothersome): -acne -fluid retention and swelling -headache -irregular periods, spotting, or absent periods -temporary pain, itching, or skin reaction at site where injected -weight gain This list may not describe all possible side effects. Call your doctor for medical advice about side effects. You may report side effects to FDA at 1-800-FDA-1088. Where should I keep my medicine? This does not apply. The injection will be given to you by a health care professional. NOTE: This sheet is a summary. It may not cover all possible information. If you have questions about this medicine, talk to your doctor, pharmacist, or health care provider.  2015, [employer]vier/[employer] Standard. (2008-08-18 18:37:56)

## 2015-04-03 LAB — CYTOLOGY - PAP

## 2015-04-03 NOTE — Progress Notes (Signed)
[place]  [phone] [phone]  ID: Stacy Pineda OB: Aug 13, 1980  MR#: [id number]  YCX#:[id number]  Patient Care Team: Ronnell Freshwater, NP as PCP - General (Family Medicine)  CHIEF COMPLAINT:  Chief Complaint  Patient presents with  . Anemia    INTERVAL HISTORY: Patient is a 34 year old female who complained of heavy extensive menses lasting up to a week and was found to have a decreased hemoglobin and iron stores. She feels weak and fatigued, but otherwise well. She has no neurologic complaints. She has a good appetite and denies weight loss. She denies any fevers. She denies any chest pain or shortness of breath. She denies any nausea, vomiting, constipation, or diarrhea. She has no urinary complaints. Patient otherwise feels well and offers no further specific complaints.  REVIEW OF SYSTEMS:   Review of Systems  Constitutional: Negative for fever.  Respiratory: Negative.   Cardiovascular: Negative.   Gastrointestinal: Negative.   Neurological: Positive for weakness.    As per HPI. Otherwise, a complete review of systems is negatve.  PAST MEDICAL HISTORY: Past Medical History  Diagnosis Date  . Depression   . PTSD (post-traumatic stress disorder)   . Anxiety   . Pancreatitis   . Chronic headaches     PAST SURGICAL HISTORY: Past Surgical History  Procedure Laterality Date  . Abdominal surgery    . Cholecystectomy    . Decubitus ulcer excision    . Gastric bypass      FAMILY HISTORY Family History  Problem Relation Age of Onset  . Graves' disease Mother   . Hypertension Mother   . Hyperlipidemia Mother   . Anxiety disorder Father   . Prostate cancer Father   . Depression Brother   . Anxiety disorder Brother   . Anxiety disorder Maternal Aunt   . Depression Maternal Aunt   . Anxiety disorder Paternal Aunt   . Depression Paternal Aunt   . Anxiety disorder Maternal Uncle   . Depression Maternal Uncle   . Anxiety  disorder Paternal Uncle   . Depression Paternal Uncle   . Anxiety disorder Maternal Grandfather   . Depression Maternal Grandfather   . Anxiety disorder Maternal Grandmother   . Depression Maternal Grandmother        ADVANCED DIRECTIVES:    HEALTH MAINTENANCE: Social History  Substance Use Topics  . Smoking status: Current Some Day Smoker    Types: Cigarettes, Cigars, E-cigarettes    Start date: 03/26/2005  . Smokeless tobacco: Never Used  . Alcohol Use: 0.0 oz/week    0 Standard drinks or equivalent per week     Comment: social     Colonoscopy:  PAP:  Bone density:  Lipid panel:  Allergies  Allergen Reactions  . Fentanyl Itching  . Foeniculum Vulgare   . Morphine And Related Itching    Itchy.   . Vicodin [Hydrocodone-Acetaminophen] Itching    Current Outpatient Prescriptions  Medication Sig Dispense Refill  . busPIRone (BUSPAR) 10 MG tablet Take 1 tablet (10 mg total) by mouth 2 (two) times daily. 60 tablet 1  . butalbital-acetaminophen-caffeine (FIORICET WITH CODEINE) 50-325-40-30 MG per capsule     . escitalopram (LEXAPRO) 10 MG tablet Take 1 tablet (10 mg total) by mouth daily. 30 tablet 1  . ondansetron (ZOFRAN) 4 MG tablet Take 4 mg by mouth.    . pregabalin (LYRICA) 100 MG capsule Take 100 mg by mouth.    . medroxyPROGESTERone (DEPO-PROVERA) 150 MG/ML injection Inject 1 mL (150 mg  total) into the muscle every 3 (three) months. 1 mL 0   No current facility-administered medications for this visit.    OBJECTIVE: Filed Vitals:   03/29/15 1045  BP: 131/89     There is no height on file to calculate BMI.    ECOG FS:0 - Asymptomatic  General: Well-developed, well-nourished, no acute distress. Eyes: Pink conjunctiva, anicteric sclera. HEENT: Normocephalic, moist mucous membranes, clear oropharnyx. Lungs: Clear to auscultation bilaterally. Heart: Regular rate and rhythm. No rubs, murmurs, or gallops. Abdomen: Soft, nontender, nondistended. No organomegaly  noted, normoactive bowel sounds. Musculoskeletal: No edema, cyanosis, or clubbing. Neuro: Alert, answering all questions appropriately. Cranial nerves grossly intact. Skin: No rashes or petechiae noted. Psych: Normal affect. Lymphatics: No cervical, calvicular, axillary or inguinal LAD.   LAB RESULTS:  Lab Results  Component Value Date   NA 138 07/07/2014   K 3.6 07/07/2014   CL 102 07/07/2014   CO2 28 07/07/2014   GLUCOSE 156* 07/07/2014   BUN 10 07/07/2014   CREATININE 0.72 07/07/2014   CALCIUM 9.5 07/07/2014   PROT 8.4* 07/07/2014   ALBUMIN 3.9 07/07/2014   AST 13* 07/07/2014   ALT 34 07/07/2014   ALKPHOS 113 07/07/2014   BILITOT 0.5 07/07/2014   GFRNONAA >60 07/07/2014   GFRAA >60 07/07/2014    Lab Results  Component Value Date   WBC 9.2 07/07/2014   NEUTROABS 8.2* 07/07/2014   HGB 10.1* 07/07/2014   HCT 33.2* 07/07/2014   MCV 64* 07/07/2014   PLT 249 07/07/2014     STUDIES: Mr Jeri Cos Wo Contrast  20-Apr-2015   CLINICAL DATA:  34 year old female with a pulsating headache, dizziness and fatigue. Hyperprolactinemia. Initial encounter.  EXAM: MRI HEAD WITHOUT AND WITH CONTRAST  TECHNIQUE: Multiplanar, multiecho pulse sequences of the brain and surrounding structures were obtained without and with intravenous contrast.  CONTRAST:  75mL MULTIHANCE GADOBENATE DIMEGLUMINE 529 MG/ML IV SOLN  COMPARISON:  None.  FINDINGS: No restricted diffusion to suggest acute infarction. No midline shift, mass effect, evidence of mass lesion, ventriculomegaly, extra-axial collection or acute intracranial hemorrhage. Cervicomedullary junction within normal limits. Cerebral volume is normal.  Major intracranial vascular flow voids are preserved. There is moderate to severe tortuosity of the distal vertebral arteries and vertebrobasilar junction in the ventral basilar cisterns. There is comparatively mild tortuosity of the ICA siphons.  Pearline Cables and white matter signal is within normal limits  throughout the brain. No abnormal gray or white matter enhancement.  Negative visualized cervical spine. Visualized bone marrow signal is within normal limits. Orbits soft tissues appear normal. Visible internal auditory structures appear normal. Trace paranasal sinus mucosal thickening. Mastoids are clear. Scalp soft tissues appear normal.  Dedicated pituitary imaging. Overall pituitary size is normal for age and gender. The infundibulum appears within normal limits. There is no suprasellar mass or mass effect. The cavernous sinuses appear within normal limits. The hypothalamus appears normal. Pre and post-contrast T1 signal within the pituitary gland is homogeneous. No abnormal pituitary enhancement or discrete lesion identified. No abnormal enhancement elsewhere.  IMPRESSION: 1. MRI appearance of the pituitary is within normal limits. 2. Tortuous intracranial arteries, especially the distal vertebral arteries, is uncommon in a patient this age. Query chronic hypertension. 3. Otherwise normal MRI appearance of the brain.   Electronically Signed   By: Genevie Ann M.D.   On: 04/20/2015 16:11   Ct Abdomen Pelvis W Contrast  03/27/2015   CLINICAL DATA:  Diffuse abdominal pain, history of prior gastric bypass, pancreatic stent  placed and removed in 2014, now with gastric sleeve, left upper quadrant pain  EXAM: CT ABDOMEN AND PELVIS WITH CONTRAST  TECHNIQUE: Multidetector CT imaging of the abdomen and pelvis was performed using the standard protocol following bolus administration of intravenous contrast.  CONTRAST:  186mL OMNIPAQUE IOHEXOL 300 MG/ML  SOLN  COMPARISON:  Abdomen films of 07/07/2014 and CT abdomen pelvis of 10/13/2013  FINDINGS: On lung window images, there is a 4 mm noncalcified nodule in the left lower lobe posterior laterally that is not seen with certainty on images through the lung bases on the prior CT. If the patient is considered high risk, then CT of the chest may be warranted.  On soft tissue  window images, the liver enhances with no focal abnormality and no ductal dilatation is seen. Surgical clips are present from prior cholecystectomy. The pancreas is normal in size and the pancreatic duct is not dilated. The right adrenal gland is unremarkable. There is a left adrenal mass of 2.5 x 2.3 cm with attenuation of 29 HU. This was present previously and is unchanged. An unenhanced CT of the abdomen may be helpful to assess the probable benign nature of this left adrenal lesion. The spleen is unremarkable. Multiple sutures and clips are present in the left upper quadrant in this patient who has undergone prior gastric bypass and subsequent gastric sleeve surgery. The kidneys enhance with no calculus or mass and no hydronephrosis is seen. The abdominal aorta is normal in caliber.  The urinary bladder is decompressed. The uterus is normal in size. There is a septated low-attenuation left adrenal lesion most consistent with left ovarian cysts measuring 4.4 cm. No free fluid is seen within the pelvis. However, although the colon is largely decompressed, there is suspicion of diffuse colonic edema and pericolonic strandiness. These findings may indicate diffuse colitis. Clinical correlation is recommended. The lumbar vertebrae are in normal alignment and intervertebral disc spaces appear normal.  IMPRESSION: 1. Although the colon is largely decompressed, there is suspicion of diffuse colonic edema and pericolonic strandiness, which may indicate diffuse colitis. Clinical correlation is recommended. 2. 2.5 cm left adrenal lesion most likely benign, being stable compared to the prior CT from March of 2015. 3. Probable septated left ovarian cyst. Pelvic ultrasound may be helpful if warranted clinically. 4. 4 mm noncalcified left lower lobe lung nodule, not seen on images through the lung bases on the prior CT of 2015. If the patient is considered high risk, CT of the chest would be recommended.   Electronically  Signed   By: Ivar Drape M.D.   On: 03/27/2015 10:02    ASSESSMENT: Iron deficiency anemia.  PLAN:    1. Iron deficiency anemia: Patient's hemoglobin is 9.6. Total iron 21, iron session 5%, ferritin 6. The remainder of her laboratory work was either negative or within normal limits. She will benefit from IV iron. Return to clinic in 1 and then again in 2 weeks to receive 510 mg IV Feraheme. Patient will then return to clinic in 3 months with repeat laboratory work and further evaluation. 2. Heavy menses: Patient was given a referral to gynecology by her primary care physician.   Patient expressed understanding and was in agreement with this plan. She also understands that She can call clinic at any time with any questions, concerns, or complaints.   Lloyd Huger, MD   04/03/2015 8:58 AM

## 2015-04-05 ENCOUNTER — Inpatient Hospital Stay: Payer: Medicare Other

## 2015-04-05 DIAGNOSIS — Z9884 Bariatric surgery status: Secondary | ICD-10-CM | POA: Diagnosis not present

## 2015-04-05 DIAGNOSIS — R51 Headache: Secondary | ICD-10-CM | POA: Diagnosis not present

## 2015-04-05 DIAGNOSIS — N920 Excessive and frequent menstruation with regular cycle: Secondary | ICD-10-CM | POA: Diagnosis not present

## 2015-04-05 DIAGNOSIS — I771 Stricture of artery: Secondary | ICD-10-CM | POA: Diagnosis not present

## 2015-04-05 DIAGNOSIS — D509 Iron deficiency anemia, unspecified: Secondary | ICD-10-CM | POA: Diagnosis not present

## 2015-04-05 DIAGNOSIS — E221 Hyperprolactinemia: Secondary | ICD-10-CM | POA: Diagnosis not present

## 2015-04-05 MED ORDER — FERUMOXYTOL INJECTION 510 MG/17 ML
510.0000 mg | Freq: Once | INTRAVENOUS | Status: AC
  Administered 2015-04-05: 510 mg via INTRAVENOUS
  Filled 2015-04-05: qty 17

## 2015-04-05 MED ORDER — SODIUM CHLORIDE 0.9 % IV SOLN
Freq: Once | INTRAVENOUS | Status: AC
  Administered 2015-04-05: 10 mL/h via INTRAVENOUS
  Filled 2015-04-05: qty 1000

## 2015-04-12 ENCOUNTER — Inpatient Hospital Stay: Payer: Medicare Other | Attending: Oncology

## 2015-04-12 VITALS — BP 116/75 | HR 84 | Resp 20

## 2015-04-12 DIAGNOSIS — Z9884 Bariatric surgery status: Secondary | ICD-10-CM | POA: Diagnosis not present

## 2015-04-12 DIAGNOSIS — N920 Excessive and frequent menstruation with regular cycle: Secondary | ICD-10-CM | POA: Insufficient documentation

## 2015-04-12 DIAGNOSIS — D509 Iron deficiency anemia, unspecified: Secondary | ICD-10-CM

## 2015-04-12 MED ORDER — SODIUM CHLORIDE 0.9 % IV SOLN
Freq: Once | INTRAVENOUS | Status: AC
  Administered 2015-04-12: 10 mL/h via INTRAVENOUS
  Filled 2015-04-12: qty 1000

## 2015-04-12 MED ORDER — SODIUM CHLORIDE 0.9 % IV SOLN
510.0000 mg | Freq: Once | INTRAVENOUS | Status: AC
  Administered 2015-04-12: 510 mg via INTRAVENOUS
  Filled 2015-04-12: qty 17

## 2015-04-17 ENCOUNTER — Other Ambulatory Visit: Payer: Self-pay | Admitting: Nurse Practitioner

## 2015-04-17 DIAGNOSIS — D381 Neoplasm of uncertain behavior of trachea, bronchus and lung: Secondary | ICD-10-CM | POA: Diagnosis not present

## 2015-04-17 DIAGNOSIS — R911 Solitary pulmonary nodule: Secondary | ICD-10-CM

## 2015-04-17 DIAGNOSIS — D509 Iron deficiency anemia, unspecified: Secondary | ICD-10-CM | POA: Diagnosis not present

## 2015-04-17 DIAGNOSIS — R109 Unspecified abdominal pain: Secondary | ICD-10-CM | POA: Diagnosis not present

## 2015-04-17 DIAGNOSIS — N8320 Unspecified ovarian cysts: Secondary | ICD-10-CM | POA: Diagnosis not present

## 2015-04-17 DIAGNOSIS — R51 Headache: Secondary | ICD-10-CM | POA: Diagnosis not present

## 2015-04-17 DIAGNOSIS — E221 Hyperprolactinemia: Secondary | ICD-10-CM | POA: Diagnosis not present

## 2015-04-20 ENCOUNTER — Ambulatory Visit
Admission: RE | Admit: 2015-04-20 | Discharge: 2015-04-20 | Disposition: A | Discharge status: Home / Self Care | Payer: Medicare Other | Source: Ambulatory Visit | Attending: Nurse Practitioner | Admitting: Nurse Practitioner

## 2015-04-20 DIAGNOSIS — R0789 Other chest pain: Secondary | ICD-10-CM | POA: Diagnosis not present

## 2015-04-20 DIAGNOSIS — R911 Solitary pulmonary nodule: Secondary | ICD-10-CM | POA: Diagnosis not present

## 2015-04-20 MED ORDER — IOHEXOL 350 MG/ML SOLN
75.0000 mL | Freq: Once | INTRAVENOUS | Status: AC | PRN
  Administered 2015-04-20: 75 mL via INTRAVENOUS

## 2015-04-25 ENCOUNTER — Encounter: Payer: Self-pay | Admitting: Internal Medicine

## 2015-04-30 ENCOUNTER — Ambulatory Visit: Payer: Self-pay | Admitting: Psychiatry

## 2015-05-03 ENCOUNTER — Ambulatory Visit (INDEPENDENT_AMBULATORY_CARE_PROVIDER_SITE_OTHER): Payer: Medicare Other | Admitting: Psychiatry

## 2015-05-03 ENCOUNTER — Encounter: Payer: Self-pay | Admitting: Psychiatry

## 2015-05-03 VITALS — BP 118/70 | HR 80 | Temp 97.9°F | Ht 72.0 in | Wt 154.4 lb

## 2015-05-03 DIAGNOSIS — F450 Somatization disorder: Secondary | ICD-10-CM | POA: Diagnosis not present

## 2015-05-03 DIAGNOSIS — F331 Major depressive disorder, recurrent, moderate: Secondary | ICD-10-CM

## 2015-05-03 MED ORDER — HYDROXYZINE PAMOATE 50 MG PO CAPS: 50.0000 mg | ORAL_CAPSULE | Freq: Every day | ORAL | Status: DC

## 2015-05-03 MED ORDER — ESCITALOPRAM OXALATE 10 MG PO TABS: 10.0000 mg | ORAL_TABLET | Freq: Every day | ORAL | Status: DC

## 2015-05-03 MED ORDER — BUSPIRONE HCL 15 MG PO TABS: 15.0000 mg | ORAL_TABLET | Freq: Two times a day (BID) | ORAL | Status: DC
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
## 2015-05-07 ENCOUNTER — Other Ambulatory Visit: Payer: Self-pay | Admitting: Internal Medicine

## 2015-05-07 DIAGNOSIS — F17210 Nicotine dependence, cigarettes, uncomplicated: Secondary | ICD-10-CM | POA: Diagnosis not present

## 2015-05-07 DIAGNOSIS — R911 Solitary pulmonary nodule: Secondary | ICD-10-CM

## 2015-05-07 DIAGNOSIS — R0602 Shortness of breath: Secondary | ICD-10-CM | POA: Diagnosis not present

## 2015-05-09 ENCOUNTER — Other Ambulatory Visit: Payer: Self-pay | Admitting: Obstetrics and Gynecology

## 2015-05-09 ENCOUNTER — Ambulatory Visit: Payer: Self-pay | Admitting: Obstetrics and Gynecology

## 2015-05-09 ENCOUNTER — Ambulatory Visit: Payer: Medicare Other

## 2015-05-09 DIAGNOSIS — N8320 Unspecified ovarian cysts: Secondary | ICD-10-CM

## 2015-05-09 DIAGNOSIS — N83202 Unspecified ovarian cyst, left side: Secondary | ICD-10-CM

## 2015-05-14 DIAGNOSIS — F331 Major depressive disorder, recurrent, moderate: Secondary | ICD-10-CM | POA: Diagnosis not present

## 2015-05-14 DIAGNOSIS — F4310 Post-traumatic stress disorder, unspecified: Secondary | ICD-10-CM | POA: Diagnosis not present

## 2015-05-22 DIAGNOSIS — R51 Headache: Secondary | ICD-10-CM | POA: Diagnosis not present

## 2015-05-22 DIAGNOSIS — N921 Excessive and frequent menstruation with irregular cycle: Secondary | ICD-10-CM | POA: Diagnosis not present

## 2015-05-22 DIAGNOSIS — D509 Iron deficiency anemia, unspecified: Secondary | ICD-10-CM | POA: Diagnosis not present

## 2015-05-22 DIAGNOSIS — Z0001 Encounter for general adult medical examination with abnormal findings: Secondary | ICD-10-CM | POA: Diagnosis not present

## 2015-05-22 DIAGNOSIS — F331 Major depressive disorder, recurrent, moderate: Secondary | ICD-10-CM | POA: Diagnosis not present

## 2015-05-22 DIAGNOSIS — E221 Hyperprolactinemia: Secondary | ICD-10-CM | POA: Diagnosis not present

## 2015-06-01 DIAGNOSIS — E538 Deficiency of other specified B group vitamins: Secondary | ICD-10-CM | POA: Diagnosis not present

## 2015-06-01 DIAGNOSIS — K861 Other chronic pancreatitis: Secondary | ICD-10-CM | POA: Diagnosis not present

## 2015-06-01 DIAGNOSIS — E221 Hyperprolactinemia: Secondary | ICD-10-CM | POA: Diagnosis not present

## 2015-06-01 DIAGNOSIS — Z202 Contact with and (suspected) exposure to infections with a predominantly sexual mode of transmission: Secondary | ICD-10-CM | POA: Diagnosis not present

## 2015-06-01 DIAGNOSIS — D509 Iron deficiency anemia, unspecified: Secondary | ICD-10-CM | POA: Diagnosis not present

## 2015-06-01 DIAGNOSIS — T8069XS Other serum reaction due to other serum, sequela: Secondary | ICD-10-CM | POA: Diagnosis not present

## 2015-06-06 DIAGNOSIS — R0602 Shortness of breath: Secondary | ICD-10-CM | POA: Diagnosis not present

## 2015-06-18 DIAGNOSIS — J4530 Mild persistent asthma, uncomplicated: Secondary | ICD-10-CM | POA: Diagnosis not present

## 2015-06-18 DIAGNOSIS — R911 Solitary pulmonary nodule: Secondary | ICD-10-CM | POA: Diagnosis not present

## 2015-06-18 DIAGNOSIS — F17210 Nicotine dependence, cigarettes, uncomplicated: Secondary | ICD-10-CM | POA: Diagnosis not present

## 2015-06-18 DIAGNOSIS — Z23 Encounter for immunization: Secondary | ICD-10-CM | POA: Diagnosis not present

## 2015-06-19 ENCOUNTER — Other Ambulatory Visit: Payer: Self-pay | Admitting: *Deleted

## 2015-06-19 ENCOUNTER — Telehealth: Payer: Self-pay | Admitting: Obstetrics and Gynecology

## 2015-06-19 DIAGNOSIS — K861 Other chronic pancreatitis: Secondary | ICD-10-CM | POA: Diagnosis not present

## 2015-06-19 DIAGNOSIS — R109 Unspecified abdominal pain: Secondary | ICD-10-CM | POA: Diagnosis not present

## 2015-06-19 DIAGNOSIS — G8929 Other chronic pain: Secondary | ICD-10-CM | POA: Diagnosis not present

## 2015-06-19 DIAGNOSIS — R197 Diarrhea, unspecified: Secondary | ICD-10-CM | POA: Diagnosis not present

## 2015-06-19 MED ORDER — MEDROXYPROGESTERONE ACETATE 150 MG/ML IM SUSP: 150.0000 mg | INTRAMUSCULAR | Status: DC

## 2015-06-19 NOTE — Telephone Encounter (Signed)
Done-ac 

## 2015-06-19 NOTE — Telephone Encounter (Signed)
Patient needs a refill on depo sent to [facility] on [address]. She has an appointment tomorrow.Thanks

## 2015-06-20 ENCOUNTER — Ambulatory Visit (INDEPENDENT_AMBULATORY_CARE_PROVIDER_SITE_OTHER): Payer: Medicare Other

## 2015-06-20 VITALS — BP 122/86 | HR 76 | Ht 72.0 in | Wt 156.3 lb

## 2015-06-20 DIAGNOSIS — N921 Excessive and frequent menstruation with irregular cycle: Secondary | ICD-10-CM

## 2015-06-20 MED ORDER — MEDROXYPROGESTERONE ACETATE 150 MG/ML IM SUSP
150.0000 mg | Freq: Once | INTRAMUSCULAR | Status: AC
  Administered 2015-06-20: 150 mg via INTRAMUSCULAR

## 2015-06-20 NOTE — Progress Notes (Signed)
Patient ID: Stacy Pineda, female   DOB: [birth year], 34 y.o.   MRN: [id number] Pt present for Depo-Provera injection for menorrhagia with irreg. Cycle. Pt states although bleeding is not as heavy, it continues and menstral cramps have not improved. Was to have a f/u appt after her ultrasound in Sept but this was canceled by office and was supposed to be called and rescheduled but pt states she did not receive call. Pt to get the next available f/u appt with [facility]. Pt has questions about plan of care.

## 2015-06-22 ENCOUNTER — Ambulatory Visit (INDEPENDENT_AMBULATORY_CARE_PROVIDER_SITE_OTHER): Payer: Medicare Other | Admitting: Obstetrics and Gynecology

## 2015-06-22 ENCOUNTER — Encounter: Payer: Self-pay | Admitting: Obstetrics and Gynecology

## 2015-06-22 VITALS — BP 113/78 | HR 87 | Ht 72.0 in | Wt 159.3 lb

## 2015-06-22 DIAGNOSIS — N921 Excessive and frequent menstruation with irregular cycle: Secondary | ICD-10-CM | POA: Diagnosis not present

## 2015-06-22 DIAGNOSIS — N926 Irregular menstruation, unspecified: Secondary | ICD-10-CM | POA: Diagnosis not present

## 2015-06-22 DIAGNOSIS — R102 Pelvic and perineal pain: Secondary | ICD-10-CM | POA: Diagnosis not present

## 2015-06-22 NOTE — Progress Notes (Signed)
Patient ID: Stacy Pineda, female   DOB: [birth year], 34 y.o.   MRN: [id number]  S: reports continued daily bleeding since first depo injection, but less flow and even spotting most days, cramping has continued at same intensity most of the time. Otherwise happy with depo results.  O: A&Ox4 Well groomed female in no distress Pelvic exam: normal external genitalia, vulva, vagina, cervix, uterus and adnexa.  A: menorrhagia improved with depo, but dysmenorrhea persist  P: desires to continue with depo as had second shot this week, hope for full resolution of symptoms.  RTC prn  Brizza Nathanson Trudee Kuster, CNM

## 2015-06-28 ENCOUNTER — Inpatient Hospital Stay: Payer: Medicare Other

## 2015-06-28 ENCOUNTER — Inpatient Hospital Stay: Payer: Medicare Other | Attending: Oncology

## 2015-06-28 ENCOUNTER — Inpatient Hospital Stay (HOSPITAL_BASED_OUTPATIENT_CLINIC_OR_DEPARTMENT_OTHER): Payer: Medicare Other | Admitting: Oncology

## 2015-06-28 DIAGNOSIS — N920 Excessive and frequent menstruation with regular cycle: Secondary | ICD-10-CM | POA: Insufficient documentation

## 2015-06-28 DIAGNOSIS — F418 Other specified anxiety disorders: Secondary | ICD-10-CM

## 2015-06-28 DIAGNOSIS — F17210 Nicotine dependence, cigarettes, uncomplicated: Secondary | ICD-10-CM

## 2015-06-28 DIAGNOSIS — R51 Headache: Secondary | ICD-10-CM | POA: Diagnosis not present

## 2015-06-28 DIAGNOSIS — E538 Deficiency of other specified B group vitamins: Secondary | ICD-10-CM

## 2015-06-28 DIAGNOSIS — F4310 Post-traumatic stress disorder, unspecified: Secondary | ICD-10-CM | POA: Diagnosis not present

## 2015-06-28 DIAGNOSIS — Z79899 Other long term (current) drug therapy: Secondary | ICD-10-CM | POA: Diagnosis not present

## 2015-06-28 DIAGNOSIS — D509 Iron deficiency anemia, unspecified: Secondary | ICD-10-CM | POA: Diagnosis not present

## 2015-06-28 LAB — CBC
HCT: 32.7 % — ABNORMAL LOW (ref 35.0–47.0)
Hemoglobin: 10.4 g/dL — ABNORMAL LOW (ref 12.0–16.0)
MCH: 22.1 pg — AB (ref 26.0–34.0)
MCHC: 31.9 g/dL — AB (ref 32.0–36.0)
MCV: 69.1 fL — AB (ref 80.0–100.0)
PLATELETS: 242 10*3/uL (ref 150–440)
RBC: 4.74 MIL/uL (ref 3.80–5.20)
RDW: 22.3 % — ABNORMAL HIGH (ref 11.5–14.5)
WBC: 6.7 10*3/uL (ref 3.6–11.0)

## 2015-06-28 LAB — FOLATE: Folate: 3 ng/mL — ABNORMAL LOW (ref >5.9)

## 2015-06-28 LAB — VITAMIN B12: VITAMIN B 12: 154 pg/mL — AB (ref 180–914)

## 2015-06-28 LAB — IRON AND TIBC
Iron: 44 ug/dL (ref 28–170)
SATURATION RATIOS: 14 % (ref 10.4–31.8)
TIBC: 305 ug/dL (ref 250–450)
UIBC: 261 ug/dL

## 2015-06-28 LAB — LACTATE DEHYDROGENASE: LDH: 136 U/L (ref 98–192)

## 2015-06-28 LAB — FERRITIN: FERRITIN: 31 ng/mL (ref 11–307)

## 2015-06-28 LAB — DAT, POLYSPECIFIC AHG (ARMC ONLY): POLYSPECIFIC AHG TEST: NEGATIVE

## 2015-06-29 ENCOUNTER — Encounter: Payer: Self-pay | Admitting: Nurse Practitioner

## 2015-06-29 LAB — HAPTOGLOBIN: Haptoglobin: 168 mg/dL (ref 34–200)

## 2015-06-29 LAB — ANA W/REFLEX: Anti Nuclear Antibody(ANA): NEGATIVE

## 2015-07-10 ENCOUNTER — Encounter: Payer: Self-pay | Admitting: Psychiatry

## 2015-07-10 ENCOUNTER — Ambulatory Visit (INDEPENDENT_AMBULATORY_CARE_PROVIDER_SITE_OTHER): Payer: Medicare Other | Admitting: Psychiatry

## 2015-07-10 VITALS — BP 122/78 | HR 83 | Temp 99.1°F | Ht 72.0 in | Wt 156.2 lb

## 2015-07-10 DIAGNOSIS — F331 Major depressive disorder, recurrent, moderate: Secondary | ICD-10-CM | POA: Diagnosis not present

## 2015-07-10 MED ORDER — ESCITALOPRAM OXALATE 10 MG PO TABS: 10.0000 mg | ORAL_TABLET | Freq: Every day | ORAL | Status: DC

## 2015-07-10 MED ORDER — BUSPIRONE HCL 10 MG PO TABS: 10.0000 mg | ORAL_TABLET | Freq: Two times a day (BID) | ORAL | Status: DC

## 2015-07-10 MED ORDER — AMITRIPTYLINE HCL 25 MG PO TABS: 25.0000 mg | ORAL_TABLET | Freq: Every day | ORAL | Status: DC

## 2015-07-10 NOTE — Progress Notes (Signed)
Psychiatric MD Progress Note  Patient Identification: Stacy Pineda MRN:  [id number] Date of Evaluation:  07/10/2015 Referral Source: [facility]  Chief Complaint:   Chief Complaint    Follow-up; Medication Refill; Medication Problem; Insomnia; Anxiety     Visit Diagnosis:    ICD-9-CM ICD-10-CM   1. MDD (major depressive disorder), recurrent episode, moderate (HCC) 296.32 F33.1    Diagnosis:   Patient Active Problem List   Diagnosis Date Noted  . Iron deficiency anemia [D50.9] 03/29/2015  . Deep vein thrombosis ([place]) [I82.409] 07/20/2014  . Deep vein thrombosis (DVT) ([place]) [I82.409] 07/20/2014  . 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 Chronic abdominal pain [R10.9, G89.29] 02/13/2014  . Chronic diarrhea [K52.9] 02/13/2014  . Acid indigestion [R10.13] 02/13/2014  . Anastomotic ulcer [K28.9] 04/29/2012  . Anxiety state [F41.1] 04/06/2012  . H/O gastrostomy ([place]) [Z93.4] 04/06/2012  . History of gastrostomy ([place]) [Z93.4] 04/06/2012  . Abdominal pain, epigastric [R10.13] 02/18/2012  . Feeling bilious [R11.0] 01/15/2012  . Acute inflammation of the pancreas [K85.9] 12/23/2011  . H/O surgical procedure [Z98.890] 10/17/2011  . History of surgical procedure [Z98.890] 10/17/2011  . Bacterial pneumonia [J15.9] 06/19/2011  . Febrile [R50.9] 06/19/2011  . Clinical depression [F32.9] 02/18/2011  . Disease of female genital organs [N94.9] 02/18/2011  . Essential (primary) hypertension [I10] 02/18/2011  . Family history of cardiovascular disease [Z82.49] 02/18/2011  . Family history of diabetes mellitus [Z83.3] 02/18/2011  . Family history of breast cancer [Z80.3] 02/18/2011  . Morbidity or mortality, unknown cause [R69] 02/18/2011  . Compulsive tobacco user syndrome [F17.200] 02/18/2011  . Extreme obesity ([place]) [E66.01] 02/18/2011  . Morbid obesity ([place]) [E66.01] 02/18/2011  . Current tobacco use [Z72.0] 02/18/2011  . Illness [R69] 02/18/2011   History of  Present Illness:   Pt is a 34 yo homosexual female who has recently relocated to [place] almost a year ago went to [place] to spend time with her family members. Patient reported that she continues to have problems with sleep and is not sleeping well with the help of Vistaril. Patient reported that she smokes marijuana on a regular basis as it helps her relax and has been taking Vistaril but it is not effective. She has tried trazodone in the past but she really hates the medication is makes her too sedated and groggy. She reported that she has also tried Seroquel in the past she does not agree with the medication as well. She reported that she wants to be prescribed "benzodiazepines".  She will also went to the pain clinic but they did not prescribe her any pain medication and located her chart and advised her that she need to discuss her medications with her other practitioners. They continue to give her liver, for chronic pain management. She is also getting Topamax from her general practitioner for chronic migraine headaches. Patient stated that she is getting iron transfusions every 3 weeks due to anemia. She stated that she has poor appetite as she cannot tolerate several different types of food.  She reported that she is trying to eat healthy and feels that she is anxious most of the time She remains focused on her medications during the interview   Past Medical History:  Past Medical History  Diagnosis Date  . Depression   . PTSD (post-traumatic stress disorder)   . Anxiety   . Pancreatitis   . Chronic headaches     Past Surgical History  Procedure Laterality Date  . Abdominal surgery    . Cholecystectomy    .  Decubitus ulcer excision    . Gastric bypass     Family History:  Family History  Problem Relation Age of Onset  . Graves' disease Mother   . Hypertension Mother   . Hyperlipidemia Mother   . Anxiety disorder Father   . Prostate cancer Father   . Depression Brother    . Anxiety disorder Brother   . Anxiety disorder Maternal Aunt   . Depression Maternal Aunt   . Anxiety disorder Paternal Aunt   . Depression Paternal Aunt   . Anxiety disorder Maternal Uncle   . Depression Maternal Uncle   . Anxiety disorder Paternal Uncle   . Depression Paternal Uncle   . Anxiety disorder Maternal Grandfather   . Depression Maternal Grandfather   . Anxiety disorder Maternal Grandmother   . Depression Maternal Grandmother    Social History:   Social History   Social History  . Marital Status: Single    Spouse Name: N/A  . Number of Children: N/A  . Years of Education: N/A   Social History Main Topics  . Smoking status: Current Some Day Smoker    Types: Cigarettes, Cigars, E-cigarettes    Start date: 03/26/2005  . Smokeless tobacco: Never Used  . Alcohol Use: No     Comment: social  . Drug Use: 3.00 per week    Special: Marijuana     Comment: last used about a week ago0  . Sexual Activity: Not Currently   Other Topics Concern  . None   Social History Narrative   Additional Social History:  Patient reported that she has never been married and does not have any children. She dropped out of school and is now trying to finish her classes.  Musculoskeletal: Strength & Muscle Tone: within normal limits Gait & Station: normal Patient leans: N/A  Psychiatric Specialty Exam: Insomnia PMH includes: depression.  Anxiety Symptoms include insomnia.            Associated symptoms include insomnia.  Past medical history includes anxiety.     Review of Systems  Psychiatric/Behavioral: Positive for depression. The patient has insomnia.     Blood pressure 122/78, pulse 83, temperature 99.1 F (37.3 C), temperature source Tympanic, height 6' (1.829 m), weight 156 lb 3.2 oz (70.852 kg), last menstrual period 05/14/2015, SpO2 96 %.Body mass index is 21.18 kg/(m^2).  General Appearance: Casual  Eye Contact:  Fair  Speech:  Clear and Coherent  Volume:   Normal  Mood:  Anxious and Depressed  Affect:  Congruent  Thought Process:  Coherent  Orientation:  Full (Time, Place, and Person)  Thought Content:  WDL  Suicidal Thoughts:  No  Homicidal Thoughts:  No  Memory:  Immediate;   Fair  Judgement:  Fair  Insight:  Fair  Psychomotor Activity:  Decreased  Concentration:  Fair  Recall:  [employer] of Knowledge:Fair  Language: Fair  Akathisia:  No  Handed:  Right  AIMS (if indicated):    Assets:  Communication Skills Desire for Improvement Physical Health Social Support  ADL's:  Intact  Cognition: WNL  Sleep:     Is the patient at risk to self?  No. Has the patient been a risk to self in the past 6 months?  No. Has the patient been a risk to self within the distant past?  No. Is the patient a risk to others?  No. Has the patient been a risk to others in the past 6 months?  No. Has the patient been  a risk to others within the distant past?  No.  Allergies:   Allergies  Allergen Reactions  . Fentanyl Itching  . Foeniculum Vulgare   . Morphine And Related Itching    Itchy.   . Vicodin [Hydrocodone-Acetaminophen] Itching  . Buprenorphine Hcl Itching    Itchy.    Current Medications: Current Outpatient Prescriptions  Medication Sig Dispense Refill  . albuterol (VENTOLIN HFA) 108 (90 BASE) MCG/ACT inhaler     . ASMANEX HFA 100 MCG/ACT AERO     . busPIRone (BUSPAR) 15 MG tablet Take 1 tablet (15 mg total) by mouth 2 (two) times daily. qam and q1 pm 60 tablet 1  . butalbital-acetaminophen-caffeine (FIORICET WITH CODEINE) 50-325-40-30 MG capsule     . CREON 36000 UNITS CPEP capsule     . escitalopram (LEXAPRO) 10 MG tablet     . hydrOXYzine (VISTARIL) 50 MG capsule Take 1 capsule (50 mg total) by mouth at bedtime. 30 capsule 1  . medroxyPROGESTERone (DEPO-PROVERA) 150 MG/ML injection Inject 1 mL (150 mg total) into the muscle every 3 (three) months. 1 mL 3  . ondansetron (ZOFRAN) 4 MG tablet Take 4 mg by mouth.    . pregabalin  (LYRICA) 100 MG capsule Take 100 mg by mouth.    . topiramate (TOPAMAX) 25 MG tablet     . MedroxyPROGESTERone Acetate 150 MG/ML SUSY      No current facility-administered medications for this visit.    Previous Psychotropic Medications:  She has tried Klonopin alprazolam Xanax Prozac Celexa and Abilify trazodone and Latuda in the past.   Substance Abuse History in the last 12 months:  No.  Consequences of Substance Abuse: Negative NA  Medical Decision Making:  Review of Psycho-Social Stressors (1) and Review or order medicine tests (1)  Treatment Plan Summary: Medication management   Anxiety She will continue on buspirone 10mg  BID  Depression She will continue on Lexapro 10 mg daily  Insomnia Discussed with patient about several medication options and she does not want to take trazodone or Seroquel. I advised her about Elavil and discussed with her about the adverse effects including the cardiac side effects in detail and she agreed to a trial of Elavil 25 mg at bedtime. She reported that she does not have any cardiac problems. She continues to smoke marijuana on a daily basis.   Follow-up 2 months or earlier depending on her symptoms  Recommended her for therapy with Elmyra Ricks and she agreed with the plan   .    More than 50% of the time spent in psychoeducation, counseling and coordination of care.  Time spent with the patient 25 minutes   This note was generated in part or whole with voice recognition software. Voice regonition is usually quite accurate but there are transcription errors that can and very often do occur. I apologize for any typographical errors that were not detected and corrected.     Rainey Pines 11/29/20169:44 AM

## 2015-07-11 NOTE — Progress Notes (Signed)
Spoke with Ebony Hail and asked her to discontinue the vistaril hydroxyzine 50mg . Pt states it not working.    The lexapro was refilled on  07-10-15

## 2015-07-13 NOTE — Progress Notes (Signed)
Stacy Pineda  Telephone:([phone]) [phone] Fax:(336) [phone]  ID: Arwilda Martinique OB: 1981-02-22  MR#: [id number]  [id number]  Patient Care Team: Ronnell Freshwater, NP as PCP - General (Family Medicine)  CHIEF COMPLAINT:  Chief Complaint  Patient presents with  . Follow-up    IDA    INTERVAL HISTORY: Patient returns to clinic today for repeat laboratory work and further evaluation. She feels improved since receiving IV Feraheme and does not complain of weakness or fatigue today. She has no neurologic complaints. She has a good appetite and denies weight loss. She denies any fevers. She denies any chest pain or shortness of breath. She denies any nausea, vomiting, constipation, or diarrhea. She has no urinary complaints. Patient offers no specific complaints today.  REVIEW OF SYSTEMS:   Review of Systems  Constitutional: Negative for fever and malaise/fatigue.  Respiratory: Negative.   Cardiovascular: Negative.   Gastrointestinal: Negative.   Musculoskeletal: Negative.   Neurological: Negative.  Negative for weakness.    As per HPI. Otherwise, a complete review of systems is negatve.  PAST MEDICAL HISTORY: Past Medical History  Diagnosis Date  . Depression   . PTSD (post-traumatic stress disorder)   . Anxiety   . Pancreatitis   . Chronic headaches     PAST SURGICAL HISTORY: Past Surgical History  Procedure Laterality Date  . Abdominal surgery    . Cholecystectomy    . Decubitus ulcer excision    . Gastric bypass      FAMILY HISTORY Family History  Problem Relation Age of Onset  . Graves' disease Mother   . Hypertension Mother   . Hyperlipidemia Mother   . Anxiety disorder Father   . Prostate cancer Father   . Depression Brother   . Anxiety disorder Brother   . Anxiety disorder Maternal Aunt   . Depression Maternal Aunt   . Anxiety disorder Paternal Aunt   . Depression Paternal Aunt   . Anxiety disorder Maternal Uncle   . Depression Maternal  Uncle   . Anxiety disorder Paternal Uncle   . Depression Paternal Uncle   . Anxiety disorder Maternal Grandfather   . Depression Maternal Grandfather   . Anxiety disorder Maternal Grandmother   . Depression Maternal Grandmother        ADVANCED DIRECTIVES:    HEALTH MAINTENANCE: Social History  Substance Use Topics  . Smoking status: Current Some Day Smoker    Types: Cigarettes, Cigars, E-cigarettes    Start date: 03/26/2005  . Smokeless tobacco: Never Used  . Alcohol Use: No     Comment: social     Colonoscopy:  PAP:  Bone density:  Lipid panel:  Allergies  Allergen Reactions  . Fentanyl Itching  . Foeniculum Vulgare   . Morphine And Related Itching    Itchy.   . Vicodin [Hydrocodone-Acetaminophen] Itching  . Buprenorphine Hcl Itching    Itchy.     Current Outpatient Prescriptions  Medication Sig Dispense Refill  . albuterol (VENTOLIN HFA) 108 (90 BASE) MCG/ACT inhaler     . ASMANEX HFA 100 MCG/ACT AERO     . butalbital-acetaminophen-caffeine (FIORICET WITH CODEINE) 50-325-40-30 MG capsule     . CREON 36000 UNITS CPEP capsule     . medroxyPROGESTERone (DEPO-PROVERA) 150 MG/ML injection Inject 1 mL (150 mg total) into the muscle every 3 (three) months. 1 mL 3  . MedroxyPROGESTERone Acetate 150 MG/ML SUSY     . ondansetron (ZOFRAN) 4 MG tablet Take 4 mg by mouth.    Marland Kitchen  pregabalin (LYRICA) 100 MG capsule Take 100 mg by mouth.    . topiramate (TOPAMAX) 25 MG tablet     . amitriptyline (ELAVIL) 25 MG tablet Take 1 tablet (25 mg total) by mouth at bedtime. 30 tablet 1  . busPIRone (BUSPAR) 10 MG tablet Take 1 tablet (10 mg total) by mouth 2 (two) times daily. qam and q1 pm 60 tablet 1  . escitalopram (LEXAPRO) 10 MG tablet Take 1 tablet (10 mg total) by mouth daily. 30 tablet 1   No current facility-administered medications for this visit.    OBJECTIVE: There were no vitals filed for this visit.   There is no weight on file to calculate BMI.    ECOG FS:0 -  Asymptomatic  General: Well-developed, well-nourished, no acute distress. Eyes: Pink conjunctiva, anicteric sclera. Lungs: Clear to auscultation bilaterally. Heart: Regular rate and rhythm. No rubs, murmurs, or gallops. Abdomen: Soft, nontender, nondistended. No organomegaly noted, normoactive bowel sounds. Musculoskeletal: No edema, cyanosis, or clubbing. Neuro: Alert, answering all questions appropriately. Cranial nerves grossly intact. Skin: No rashes or petechiae noted. Psych: Normal affect.   LAB RESULTS:  Lab Results  Component Value Date   NA 138 07/07/2014   K 3.6 07/07/2014   CL 102 07/07/2014   CO2 28 07/07/2014   GLUCOSE 156* 07/07/2014   BUN 10 07/07/2014   CREATININE 0.72 07/07/2014   CALCIUM 9.5 07/07/2014   PROT 8.4* 07/07/2014   ALBUMIN 3.9 07/07/2014   AST 13* 07/07/2014   ALT 34 07/07/2014   ALKPHOS 113 07/07/2014   BILITOT 0.5 07/07/2014   GFRNONAA >60 07/07/2014   GFRAA >60 07/07/2014    Lab Results  Component Value Date   WBC 6.7 06/28/2015   NEUTROABS 8.2* 07/07/2014   HGB 10.4* 06/28/2015   HCT 32.7* 06/28/2015   MCV 69.1* 06/28/2015   PLT 242 06/28/2015     STUDIES: No results found.  ASSESSMENT: Iron deficiency anemia.  PLAN:    1. Iron deficiency anemia: Patient's hemoglobin has improved to 10.4 and her iron stores are now within normal limits. She has a mildly decreased folate and B-12, but the remainder of her laboratory work is within normal limits. She does not require IV iron at this time. Return to clinic in 3 months with repeat laboratory work and further evaluation.  2. Heavy menses: Patient was previously given a referral to gynecology by her primary care physician.  Patient expressed understanding and was in agreement with this plan. She also understands that She can call clinic at any time with any questions, concerns, or complaints.   Lloyd Huger, MD   07/13/2015 5:55 PM

## 2015-08-08 DIAGNOSIS — K253 Acute gastric ulcer without hemorrhage or perforation: Secondary | ICD-10-CM | POA: Diagnosis not present

## 2015-08-08 DIAGNOSIS — R1013 Epigastric pain: Secondary | ICD-10-CM | POA: Diagnosis not present

## 2015-08-08 DIAGNOSIS — Z9884 Bariatric surgery status: Secondary | ICD-10-CM | POA: Diagnosis not present

## 2015-08-16 DIAGNOSIS — K861 Other chronic pancreatitis: Secondary | ICD-10-CM | POA: Diagnosis not present

## 2015-08-16 DIAGNOSIS — R51 Headache: Secondary | ICD-10-CM | POA: Diagnosis not present

## 2015-08-16 DIAGNOSIS — Z9884 Bariatric surgery status: Secondary | ICD-10-CM | POA: Diagnosis not present

## 2015-08-16 DIAGNOSIS — G8929 Other chronic pain: Secondary | ICD-10-CM | POA: Diagnosis not present

## 2015-08-16 DIAGNOSIS — F411 Generalized anxiety disorder: Secondary | ICD-10-CM | POA: Diagnosis not present

## 2015-08-16 DIAGNOSIS — Z9889 Other specified postprocedural states: Secondary | ICD-10-CM | POA: Diagnosis not present

## 2015-08-16 DIAGNOSIS — R1013 Epigastric pain: Secondary | ICD-10-CM | POA: Diagnosis not present

## 2015-08-16 DIAGNOSIS — R109 Unspecified abdominal pain: Secondary | ICD-10-CM | POA: Diagnosis not present

## 2015-08-28 DIAGNOSIS — F331 Major depressive disorder, recurrent, moderate: Secondary | ICD-10-CM | POA: Diagnosis not present

## 2015-08-28 DIAGNOSIS — D519 Vitamin B12 deficiency anemia, unspecified: Secondary | ICD-10-CM | POA: Diagnosis not present

## 2015-08-28 DIAGNOSIS — D529 Folate deficiency anemia, unspecified: Secondary | ICD-10-CM | POA: Diagnosis not present

## 2015-08-28 DIAGNOSIS — R51 Headache: Secondary | ICD-10-CM | POA: Diagnosis not present

## 2015-08-28 DIAGNOSIS — D509 Iron deficiency anemia, unspecified: Secondary | ICD-10-CM | POA: Diagnosis not present

## 2015-08-29 DIAGNOSIS — M792 Neuralgia and neuritis, unspecified: Secondary | ICD-10-CM | POA: Diagnosis not present

## 2015-08-29 DIAGNOSIS — K861 Other chronic pancreatitis: Secondary | ICD-10-CM | POA: Diagnosis not present

## 2015-08-29 DIAGNOSIS — R109 Unspecified abdominal pain: Secondary | ICD-10-CM | POA: Diagnosis not present

## 2015-08-29 DIAGNOSIS — G8929 Other chronic pain: Secondary | ICD-10-CM | POA: Diagnosis not present

## 2015-09-05 ENCOUNTER — Ambulatory Visit: Payer: Self-pay

## 2015-09-05 ENCOUNTER — Ambulatory Visit (INDEPENDENT_AMBULATORY_CARE_PROVIDER_SITE_OTHER): Payer: Medicare Other | Admitting: Obstetrics and Gynecology

## 2015-09-05 VITALS — BP 118/78 | HR 86 | Wt 165.1 lb

## 2015-09-05 DIAGNOSIS — N926 Irregular menstruation, unspecified: Secondary | ICD-10-CM

## 2015-09-05 MED ORDER — MEDROXYPROGESTERONE ACETATE 150 MG/ML IM SUSP
150.0000 mg | Freq: Once | INTRAMUSCULAR | Status: AC
  Administered 2015-09-05: 150 mg via INTRAMUSCULAR

## 2015-09-05 NOTE — Progress Notes (Cosign Needed)
Pt is here for her depo provera inj Denies any s/e, she is doing well

## 2015-09-06 ENCOUNTER — Encounter: Payer: Self-pay | Admitting: Psychiatry

## 2015-09-06 ENCOUNTER — Ambulatory Visit (INDEPENDENT_AMBULATORY_CARE_PROVIDER_SITE_OTHER): Payer: Medicare Other | Admitting: Psychiatry

## 2015-09-06 VITALS — BP 122/84 | HR 90 | Temp 98.3°F | Ht 72.0 in | Wt 165.4 lb

## 2015-09-06 DIAGNOSIS — F450 Somatization disorder: Secondary | ICD-10-CM

## 2015-09-06 DIAGNOSIS — R519 Headache, unspecified: Secondary | ICD-10-CM | POA: Insufficient documentation

## 2015-09-06 DIAGNOSIS — J4520 Mild intermittent asthma, uncomplicated: Secondary | ICD-10-CM | POA: Insufficient documentation

## 2015-09-06 DIAGNOSIS — F331 Major depressive disorder, recurrent, moderate: Secondary | ICD-10-CM | POA: Diagnosis not present

## 2015-09-06 DIAGNOSIS — R51 Headache: Secondary | ICD-10-CM

## 2015-09-06 DIAGNOSIS — K861 Other chronic pancreatitis: Secondary | ICD-10-CM | POA: Insufficient documentation

## 2015-09-06 MED ORDER — ESCITALOPRAM OXALATE 10 MG PO TABS: 10.0000 mg | ORAL_TABLET | Freq: Every day | ORAL | Status: DC

## 2015-09-06 MED ORDER — AMITRIPTYLINE HCL 25 MG PO TABS
25.0000 mg | ORAL_TABLET | Freq: Every day | ORAL | Status: DC
Start: 2015-09-06 — End: 2015-10-30

## 2015-09-06 MED ORDER — PRAZOSIN HCL 1 MG PO CAPS: 1.0000 mg | ORAL_CAPSULE | Freq: Every day | ORAL | Status: DC

## 2015-09-06 MED ORDER — BUSPIRONE HCL 10 MG PO TABS: 10.0000 mg | ORAL_TABLET | Freq: Three times a day (TID) | ORAL | Status: DC

## 2015-09-06 NOTE — Progress Notes (Signed)
Psychiatric MD Progress Note  Patient Identification: Stacy Pineda MRN:  [id number] Date of Evaluation:  09/06/2015 Referral Source: [facility]  Chief Complaint:   Chief Complaint    Follow-up; Medication Refill; Drug Problem     Visit Diagnosis:    ICD-9-CM ICD-10-CM   1. MDD (major depressive disorder), recurrent episode, moderate (HCC) 296.32 F33.1   2. Somatization disorder 300.81 F45.0    Diagnosis:   Patient Active Problem List   Diagnosis Date Noted  . Chronic pancreatitis ([place]) [K86.1] 09/06/2015  . Headache [R51] 09/06/2015  . Asthma, mild intermittent [J45.20] 09/06/2015  . Iron deficiency anemia [D50.9] 03/29/2015  . Deep vein thrombosis ([place]) [I82.409] 07/20/2014  . Deep vein thrombosis (DVT) ([place]) [I82.409] 07/20/2014  . 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 Chronic abdominal pain [R10.9, G89.29] 02/13/2014  . Chronic diarrhea [K52.9] 02/13/2014  . Acid indigestion [R10.13] 02/13/2014  . Anastomotic ulcer [K28.9] 04/29/2012  . Anxiety state [F41.1] 04/06/2012  . H/O gastrostomy ([place]) [Z93.4] 04/06/2012  . History of gastrostomy ([place]) [Z93.4] 04/06/2012  . Abdominal pain, epigastric [R10.13] 02/18/2012  . Feeling bilious [R11.0] 01/15/2012  . Acute inflammation of the pancreas [K85.9] 12/23/2011  . H/O surgical procedure [Z98.890] 10/17/2011  . History of surgical procedure [Z98.890] 10/17/2011  . Bacterial pneumonia [J15.9] 06/19/2011  . Febrile [R50.9] 06/19/2011  . Clinical depression [F32.9] 02/18/2011  . Disease of female genital organs [N94.9] 02/18/2011  . Essential (primary) hypertension [I10] 02/18/2011  . Family history of cardiovascular disease [Z82.49] 02/18/2011  . Family history of diabetes mellitus [Z83.3] 02/18/2011  . Family history of breast cancer [Z80.3] 02/18/2011  . Morbidity or mortality, unknown cause [R69] 02/18/2011  . Compulsive tobacco user syndrome [F17.200] 02/18/2011  . Extreme obesity ([place]) [E66.01]  02/18/2011  . Morbid obesity ([place]) [E66.01] 02/18/2011  . Current tobacco use [Z72.0] 02/18/2011  . Illness [R69] 02/18/2011   History of Present Illness:   Pt is a 35 yo  Female cousin did for follow-up appointment. She reported that she spent the holidays with her family who lives in [place]. She reported that she continues to have problems with sleep and is not sleeping well. She reported that she wakes up often at night. She has been taking Elavil at bedtime. Patient reported that she has frequent nightmares and she wakes up due to the same. Patient has tried prazosin in the past and would like to try the same medication. She reported that she has been on Topamax to help with her migraine headaches. She is interested in trying the prazosin again. She reported that she is anxious about her mother who will ask her about her anxiety symptoms and she does not like going to [place] on a regular basis. Patient currently denied having any depressive symptoms. She reported that she also takes Lyrica on a regular basis. She is attending online classes and stays at home. She reported that it is difficult for her to leave the house on a regular basis.We discusses at length about the different techniques for her anxiety including going to [facility] on a regular basis and she demonstrated understanding.    Past Medical History:  Past Medical History  Diagnosis Date  . Depression   . PTSD (post-traumatic stress disorder)   . Anxiety   . Pancreatitis   . Chronic headaches     Past Surgical History  Procedure Laterality Date  . Abdominal surgery    . Cholecystectomy    . Decubitus ulcer excision    .  Gastric bypass     Family History:  Family History  Problem Relation Age of Onset  . Graves' disease Mother   . Hypertension Mother   . Hyperlipidemia Mother   . Anxiety disorder Father   . Prostate cancer Father   . Depression Brother   . Anxiety disorder Brother   . Anxiety disorder Maternal  Aunt   . Depression Maternal Aunt   . Anxiety disorder Paternal Aunt   . Depression Paternal Aunt   . Anxiety disorder Maternal Uncle   . Depression Maternal Uncle   . Anxiety disorder Paternal Uncle   . Depression Paternal Uncle   . Anxiety disorder Maternal Grandfather   . Depression Maternal Grandfather   . Anxiety disorder Maternal Grandmother   . Depression Maternal Grandmother    Social History:   Social History   Social History  . Marital Status: Single    Spouse Name: N/A  . Number of Children: N/A  . Years of Education: N/A   Social History Main Topics  . Smoking status: Current Some Day Smoker    Types: Cigarettes, Cigars, E-cigarettes    Start date: 03/26/2005  . Smokeless tobacco: Never Used  . Alcohol Use: No     Comment: social  . Drug Use: 3.00 per week    Special: Marijuana     Comment: last used about 3 days ago  . Sexual Activity: Not Currently   Other Topics Concern  . None   Social History Narrative   Additional Social History:  Patient reported that she has never been married and does not have any children. She dropped out of school and is now trying to finish her classes.  Musculoskeletal: Strength & Muscle Tone: within normal limits Gait & Station: normal Patient leans: N/A  Psychiatric Specialty Exam: Drug Problem  Insomnia PMH includes: depression.  Anxiety Symptoms include insomnia.            Associated symptoms include insomnia.  Past medical history includes anxiety.     Review of Systems  Psychiatric/Behavioral: Positive for depression. The patient has insomnia.     Blood pressure 122/84, pulse 90, temperature 98.3 F (36.8 C), temperature source Tympanic, height 6' (1.829 m), weight 165 lb 6.4 oz (75.025 kg), last menstrual period 08/21/2015, SpO2 94 %.Body mass index is 22.43 kg/(m^2).  General Appearance: Casual  Eye Contact:  Fair  Speech:  Clear and Coherent  Volume:  Normal  Mood:  Anxious and Depressed  Affect:   Congruent  Thought Process:  Coherent  Orientation:  Full (Time, Place, and Person)  Thought Content:  WDL  Suicidal Thoughts:  No  Homicidal Thoughts:  No  Memory:  Immediate;   Fair  Judgement:  Fair  Insight:  Fair  Psychomotor Activity:  Decreased  Concentration:  Fair  Recall:  [employer] of Knowledge:Fair  Language: Fair  Akathisia:  No  Handed:  Right  AIMS (if indicated):    Assets:  Communication Skills Desire for Improvement Physical Health Social Support  ADL's:  Intact  Cognition: WNL  Sleep:     Is the patient at risk to self?  No. Has the patient been a risk to self in the past 6 months?  No. Has the patient been a risk to self within the distant past?  No. Is the patient a risk to others?  No. Has the patient been a risk to others in the past 6 months?  No. Has the patient been a risk to others  within the distant past?  No.  Allergies:   Allergies  Allergen Reactions  . Fentanyl Itching  . Foeniculum Vulgare   . Morphine And Related Itching    Itchy.   . Vicodin [Hydrocodone-Acetaminophen] Itching  . Buprenorphine Hcl Itching    Itchy.    Current Medications: Current Outpatient Prescriptions  Medication Sig Dispense Refill  . albuterol (VENTOLIN HFA) 108 (90 BASE) MCG/ACT inhaler     . amitriptyline (ELAVIL) 25 MG tablet Take 1 tablet (25 mg total) by mouth at bedtime. 30 tablet 1  . ASMANEX HFA 100 MCG/ACT AERO     . busPIRone (BUSPAR) 10 MG tablet Take 1 tablet (10 mg total) by mouth 2 (two) times daily. qam and q1 pm 60 tablet 1  . butalbital-acetaminophen-caffeine (FIORICET WITH CODEINE) 50-325-40-30 MG capsule     . CREON 36000 UNITS CPEP capsule     . escitalopram (LEXAPRO) 10 MG tablet Take 1 tablet (10 mg total) by mouth daily. 30 tablet 1  . LYRICA 100 MG capsule Take 100 mg by mouth daily.    Marland Kitchen LYRICA 25 MG capsule Take 25 mg by mouth daily.    . medroxyPROGESTERone (DEPO-PROVERA) 150 MG/ML injection Inject 1 mL (150 mg total) into the  muscle every 3 (three) months. 1 mL 3  . MedroxyPROGESTERone Acetate 150 MG/ML SUSY     . ondansetron (ZOFRAN) 4 MG tablet Take 4 mg by mouth.    . topiramate (TOPAMAX) 25 MG tablet Take 25 mg by mouth.    . pregabalin (LYRICA) 100 MG capsule Take 100 mg by mouth.     No current facility-administered medications for this visit.    Previous Psychotropic Medications:  She has tried Klonopin alprazolam Xanax Prozac Celexa and Abilify trazodone and Latuda in the past.   Substance Abuse History in the last 12 months:  No.  Consequences of Substance Abuse: Negative NA  Medical Decision Making:  Review of Psycho-Social Stressors (1) and Review or order medicine tests (1)  Treatment Plan Summary: Medication management   Anxiety She will continue on buspirone 10mg  TID help with her anxiety.  Depression She will continue on Lexapro 10 mg daily  Insomnia She will continue on Elavil 25 mg at bedtime. She is also on Topamax 25 mg in the morning and 50 mg at bedtime.  Follow-up 2 months or earlier depending on her symptoms  Recommended her for therapy with Elmyra Ricks and she agreed with the plan   .    More than 50% of the time spent in psychoeducation, counseling and coordination of care.  Time spent with the patient 25 minutes   This note was generated in part or whole with voice recognition software. Voice regonition is usually quite accurate but there are transcription errors that can and very often do occur. I apologize for any typographical errors that were not detected and corrected.     Rainey Pines, MD    1/26/201710:13 AM

## 2015-09-21 DIAGNOSIS — R109 Unspecified abdominal pain: Secondary | ICD-10-CM | POA: Diagnosis not present

## 2015-09-21 DIAGNOSIS — M792 Neuralgia and neuritis, unspecified: Secondary | ICD-10-CM | POA: Diagnosis not present

## 2015-09-21 DIAGNOSIS — K861 Other chronic pancreatitis: Secondary | ICD-10-CM | POA: Diagnosis not present

## 2015-09-21 DIAGNOSIS — G8929 Other chronic pain: Secondary | ICD-10-CM | POA: Diagnosis not present

## 2015-09-28 DIAGNOSIS — D519 Vitamin B12 deficiency anemia, unspecified: Secondary | ICD-10-CM | POA: Diagnosis not present

## 2015-10-01 ENCOUNTER — Inpatient Hospital Stay: Payer: Medicare Other

## 2015-10-01 ENCOUNTER — Inpatient Hospital Stay: Payer: Medicare Other | Admitting: Oncology

## 2015-10-01 DIAGNOSIS — D509 Iron deficiency anemia, unspecified: Secondary | ICD-10-CM | POA: Diagnosis not present

## 2015-10-01 DIAGNOSIS — E538 Deficiency of other specified B group vitamins: Secondary | ICD-10-CM | POA: Diagnosis not present

## 2015-10-01 LAB — FERRITIN: Ferritin: 17 ng/mL (ref 11–307)

## 2015-10-01 LAB — CBC WITH DIFFERENTIAL/PLATELET
Basophils Relative: 1 %
EOS PCT: 1 %
HEMATOCRIT: 36.5 % (ref 35.0–47.0)
Hemoglobin: 11.7 g/dL — ABNORMAL LOW (ref 12.0–16.0)
LYMPHS PCT: 40 %
MCH: 23.5 pg — ABNORMAL LOW (ref 26.0–34.0)
MCHC: 32.2 g/dL (ref 32.0–36.0)
MCV: 73 fL — AB (ref 80.0–100.0)
Monocytes Relative: 4 %
NEUTROS PCT: 54 %
Platelets: 276 10*3/uL (ref 150–440)
RBC: 5 MIL/uL (ref 3.80–5.20)
RDW: 16.6 % — AB (ref 11.5–14.5)
WBC: 7.9 10*3/uL (ref 3.6–11.0)

## 2015-10-01 LAB — IRON AND TIBC
Iron: 77 ug/dL (ref 28–170)
Saturation Ratios: 22 % (ref 10.4–31.8)
TIBC: 358 ug/dL (ref 250–450)
UIBC: 281 ug/dL

## 2015-10-02 DIAGNOSIS — E538 Deficiency of other specified B group vitamins: Secondary | ICD-10-CM | POA: Insufficient documentation

## 2015-10-02 DIAGNOSIS — D509 Iron deficiency anemia, unspecified: Secondary | ICD-10-CM | POA: Insufficient documentation

## 2015-10-08 ENCOUNTER — Other Ambulatory Visit: Payer: Medicare Other

## 2015-10-08 ENCOUNTER — Ambulatory Visit: Payer: Medicare Other | Admitting: Oncology

## 2015-10-08 ENCOUNTER — Ambulatory Visit: Payer: Medicare Other

## 2015-10-15 ENCOUNTER — Other Ambulatory Visit: Payer: Self-pay | Admitting: Physician Assistant

## 2015-10-15 DIAGNOSIS — R911 Solitary pulmonary nodule: Secondary | ICD-10-CM

## 2015-10-16 ENCOUNTER — Ambulatory Visit
Admission: RE | Admit: 2015-10-16 | Discharge: 2015-10-16 | Disposition: A | Discharge status: Home / Self Care | Payer: Medicare Other | Source: Ambulatory Visit | Attending: Internal Medicine | Admitting: Internal Medicine

## 2015-10-16 DIAGNOSIS — R911 Solitary pulmonary nodule: Secondary | ICD-10-CM | POA: Diagnosis not present

## 2015-10-16 HISTORY — DX: Unspecified asthma, uncomplicated: J45.909

## 2015-10-16 MED ORDER — IOHEXOL 350 MG/ML SOLN
75.0000 mL | Freq: Once | INTRAVENOUS | Status: AC | PRN
  Administered 2015-10-16: 75 mL via INTRAVENOUS

## 2015-10-18 DIAGNOSIS — F17210 Nicotine dependence, cigarettes, uncomplicated: Secondary | ICD-10-CM | POA: Diagnosis not present

## 2015-10-18 DIAGNOSIS — J4530 Mild persistent asthma, uncomplicated: Secondary | ICD-10-CM | POA: Diagnosis not present

## 2015-10-18 DIAGNOSIS — R0602 Shortness of breath: Secondary | ICD-10-CM | POA: Diagnosis not present

## 2015-10-18 DIAGNOSIS — D381 Neoplasm of uncertain behavior of trachea, bronchus and lung: Secondary | ICD-10-CM | POA: Diagnosis not present

## 2015-10-30 ENCOUNTER — Encounter: Payer: Self-pay | Admitting: Psychiatry

## 2015-10-30 ENCOUNTER — Ambulatory Visit (INDEPENDENT_AMBULATORY_CARE_PROVIDER_SITE_OTHER): Payer: Medicare Other | Admitting: Psychiatry

## 2015-10-30 VITALS — BP 118/82 | HR 93 | Temp 98.2°F | Ht 72.0 in | Wt 159.6 lb

## 2015-10-30 DIAGNOSIS — F450 Somatization disorder: Secondary | ICD-10-CM

## 2015-10-30 DIAGNOSIS — F331 Major depressive disorder, recurrent, moderate: Secondary | ICD-10-CM

## 2015-10-30 MED ORDER — TOPIRAMATE 25 MG PO TABS: 75.0000 mg | ORAL_TABLET | Freq: Every day | ORAL | Status: DC

## 2015-10-30 MED ORDER — ESCITALOPRAM OXALATE 20 MG PO TABS
20.0000 mg | ORAL_TABLET | Freq: Every day | ORAL | Status: DC
Start: 2015-10-30 — End: 2016-01-01

## 2015-10-30 MED ORDER — PRAZOSIN HCL 1 MG PO CAPS: 1.0000 mg | ORAL_CAPSULE | Freq: Every day | ORAL | Status: DC

## 2015-10-30 NOTE — Progress Notes (Signed)
Psychiatric MD Progress Note  Patient Identification: Stacy Pineda MRN:  [id number] Date of Evaluation:  10/30/2015 Referral Source: [facility]  Chief Complaint:   Chief Complaint    Follow-up; Medication Refill; Anxiety     Visit Diagnosis:    ICD-9-CM ICD-10-CM   1. MDD (major depressive disorder), recurrent episode, moderate (HCC) 296.32 F33.1   2. Somatization disorder 300.81 F45.0    Diagnosis:   Patient Active Problem List   Diagnosis Date Noted  . Chronic pancreatitis ([place]) [K86.1] 09/06/2015  . Headache [R51] 09/06/2015  . Asthma, mild intermittent [J45.20] 09/06/2015  . Iron deficiency anemia [D50.9] 03/29/2015  . Deep vein thrombosis ([place]) [I82.409] 07/20/2014  . Deep vein thrombosis (DVT) ([place]) [I82.409] 07/20/2014  . 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 Chronic abdominal pain [R10.9, G89.29] 02/13/2014  . Chronic diarrhea [K52.9] 02/13/2014  . Acid indigestion [R10.13] 02/13/2014  . Anastomotic ulcer [K28.9] 04/29/2012  . Anxiety state [F41.1] 04/06/2012  . H/O gastrostomy ([place]) [Z93.4] 04/06/2012  . History of gastrostomy ([place]) [Z93.4] 04/06/2012  . Abdominal pain, epigastric [R10.13] 02/18/2012  . Feeling bilious [R11.0] 01/15/2012  . Acute inflammation of the pancreas [K85.9] 12/23/2011  . H/O surgical procedure [Z98.890] 10/17/2011  . History of surgical procedure [Z98.890] 10/17/2011  . Bacterial pneumonia [J15.9] 06/19/2011  . Febrile [R50.9] 06/19/2011  . Clinical depression [F32.9] 02/18/2011  . Disease of female genital organs [N94.9] 02/18/2011  . Essential (primary) hypertension [I10] 02/18/2011  . Family history of cardiovascular disease [Z82.49] 02/18/2011  . Family history of diabetes mellitus [Z83.3] 02/18/2011  . Family history of breast cancer [Z80.3] 02/18/2011  . Morbidity or mortality, unknown cause [R69] 02/18/2011  . Compulsive tobacco user syndrome [F17.200] 02/18/2011  . Extreme obesity ([place]) [E66.01]  02/18/2011  . Morbid obesity ([place]) [E66.01] 02/18/2011  . Current tobacco use [Z72.0] 02/18/2011  . Illness [R69] 02/18/2011   History of Present Illness:   Pt is a 35 yo  Female Who presented for  follow-up appointment. She reported that she does not like the BuSpar and stated that it is not helping with her anxiety symptoms. She reported that she is currently attending school on a regular basis and has been following with a therapist. Patient reported that she is trying to find a job with the help of vocational rehabilitation as she wants to work during the summer. Patient reported that her medications are helping her. She is taking Topamax to help with her migraine headaches and it is more effective than the Elavil. She is interested in stopping the Elavil at this time. Patient stated that she is sleeping well at night. She currently denied having any nightmares. She appeared calm and cooperative during the interview. She currently denied having any suicidal homicidal ideations or plans.      Past Medical History:  Past Medical History  Diagnosis Date  . Depression   . PTSD (post-traumatic stress disorder)   . Anxiety   . Pancreatitis   . Chronic headaches   . Asthma     Past Surgical History  Procedure Laterality Date  . Abdominal surgery    . Cholecystectomy    . Decubitus ulcer excision    . Gastric bypass     Family History:  Family History  Problem Relation Age of Onset  . Graves' disease Mother   . Hypertension Mother   . Hyperlipidemia Mother   . Anxiety disorder Father   . Prostate cancer Father   . Depression Brother   . Anxiety  disorder Brother   . Anxiety disorder Maternal Aunt   . Depression Maternal Aunt   . Anxiety disorder Paternal Aunt   . Depression Paternal Aunt   . Anxiety disorder Maternal Uncle   . Depression Maternal Uncle   . Anxiety disorder Paternal Uncle   . Depression Paternal Uncle   . Anxiety disorder Maternal Grandfather   . Depression  Maternal Grandfather   . Anxiety disorder Maternal Grandmother   . Depression Maternal Grandmother    Social History:   Social History   Social History  . Marital Status: Single    Spouse Name: N/A  . Number of Children: N/A  . Years of Education: N/A   Social History Main Topics  . Smoking status: Current Some Day Smoker    Types: Cigarettes, Cigars, E-cigarettes    Start date: 03/26/2005  . Smokeless tobacco: Never Used  . Alcohol Use: No     Comment: social  . Drug Use: 3.00 per week    Special: Marijuana     Comment: last used about 3 days ago  . Sexual Activity: Not Currently   Other Topics Concern  . None   Social History Narrative   Additional Social History:  Patient reported that she has never been married and does not have any children. She dropped out of school and is now trying to finish her classes.  Musculoskeletal: Strength & Muscle Tone: within normal limits Gait & Station: normal Patient leans: N/A  Psychiatric Specialty Exam: Anxiety Symptoms include insomnia.    Drug Problem  Insomnia PMH includes: depression.          Associated symptoms include insomnia.  Past medical history includes anxiety.     Review of Systems  Psychiatric/Behavioral: Positive for depression. The patient has insomnia.     Blood pressure 118/82, pulse 93, temperature 98.2 F (36.8 C), temperature source Tympanic, height 6' (1.829 m), weight 159 lb 9.6 oz (72.394 kg), last menstrual period 10/23/2015, SpO2 98 %.Body mass index is 21.64 kg/(m^2).  General Appearance: Casual  Eye Contact:  Fair  Speech:  Clear and Coherent  Volume:  Normal  Mood:  Anxious  Affect:  Congruent  Thought Process:  Coherent  Orientation:  Full (Time, Place, and Person)  Thought Content:  WDL  Suicidal Thoughts:  No  Homicidal Thoughts:  No  Memory:  Immediate;   Fair  Judgement:  Fair  Insight:  Fair  Psychomotor Activity:  Normal  Concentration:  Fair  Recall:  [employer] of  Knowledge:Fair  Language: Fair  Akathisia:  No  Handed:  Right  AIMS (if indicated):    Assets:  Communication Skills Desire for Improvement Physical Health Social Support  ADL's:  Intact  Cognition: WNL  Sleep:     Is the patient at risk to self?  No. Has the patient been a risk to self in the past 6 months?  No. Has the patient been a risk to self within the distant past?  No. Is the patient a risk to others?  No. Has the patient been a risk to others in the past 6 months?  No. Has the patient been a risk to others within the distant past?  No.  Allergies:   Allergies  Allergen Reactions  . Fentanyl Itching  . Foeniculum Vulgare   . Morphine And Related Itching    Itchy.   . Vicodin [Hydrocodone-Acetaminophen] Itching  . Buprenorphine Hcl Itching    Itchy.    Current Medications: Current Outpatient  Prescriptions  Medication Sig Dispense Refill  . albuterol (VENTOLIN HFA) 108 (90 BASE) MCG/ACT inhaler     . amitriptyline (ELAVIL) 25 MG tablet Take 1 tablet (25 mg total) by mouth at bedtime. 30 tablet 1  . ASMANEX HFA 100 MCG/ACT AERO     . busPIRone (BUSPAR) 10 MG tablet Take 1 tablet (10 mg total) by mouth 3 (three) times daily. qam and q1 pm 90 tablet 1  . butalbital-acetaminophen-caffeine (FIORICET WITH CODEINE) 50-325-40-30 MG capsule     . CREON 36000 UNITS CPEP capsule     . escitalopram (LEXAPRO) 10 MG tablet Take 1 tablet (10 mg total) by mouth daily. 30 tablet 1  . LYRICA 100 MG capsule Take 100 mg by mouth daily.    Marland Kitchen LYRICA 25 MG capsule Take 25 mg by mouth daily.    . medroxyPROGESTERone (DEPO-PROVERA) 150 MG/ML injection Inject 1 mL (150 mg total) into the muscle every 3 (three) months. 1 mL 3  . MedroxyPROGESTERone Acetate 150 MG/ML SUSY     . ondansetron (ZOFRAN) 4 MG tablet Take 4 mg by mouth.    . prazosin (MINIPRESS) 1 MG capsule Take 1 capsule (1 mg total) by mouth at bedtime. 30 capsule 1  . topiramate (TOPAMAX) 25 MG tablet Take 25 mg by mouth. 1  pill in am and 2 pills at night     . pregabalin (LYRICA) 100 MG capsule Take 100 mg by mouth.     No current facility-administered medications for this visit.    Previous Psychotropic Medications:  She has tried Klonopin alprazolam Xanax Prozac Celexa and Abilify trazodone and Latuda in the past.   Substance Abuse History in the last 12 months:  No.  Consequences of Substance Abuse: Negative NA  Medical Decision Making:  Review of Psycho-Social Stressors (1) and Review or order medicine tests (1)  Treatment Plan Summary: Medication management     Depression She will continue on Lexapro 20 mg and will take half a pill twice daily.  Discontinue Elavil. She is also on Topamax 25 mg in the morning and 50 mg at bedtime. Discontinue BuSpar  Follow-up 2 months or earlier depending on her symptoms     More than 50% of the time spent in psychoeducation, counseling and coordination of care.  Time spent with the patient 25 minutes   This note was generated in part or whole with voice recognition software. Voice regonition is usually quite accurate but there are transcription errors that can and very often do occur. I apologize for any typographical errors that were not detected and corrected.     Rainey Pines, MD    3/21/201710:14 AM

## 2015-11-02 ENCOUNTER — Ambulatory Visit: Payer: Medicare Other

## 2015-11-05 DIAGNOSIS — G894 Chronic pain syndrome: Secondary | ICD-10-CM | POA: Diagnosis not present

## 2015-11-05 DIAGNOSIS — M792 Neuralgia and neuritis, unspecified: Secondary | ICD-10-CM | POA: Diagnosis not present

## 2015-11-05 DIAGNOSIS — R109 Unspecified abdominal pain: Secondary | ICD-10-CM | POA: Diagnosis not present

## 2015-11-05 DIAGNOSIS — R51 Headache: Secondary | ICD-10-CM | POA: Diagnosis not present

## 2015-11-05 DIAGNOSIS — G8929 Other chronic pain: Secondary | ICD-10-CM | POA: Diagnosis not present

## 2015-11-06 ENCOUNTER — Ambulatory Visit: Payer: Medicare Other | Admitting: Psychiatry

## 2015-11-07 DIAGNOSIS — K529 Noninfective gastroenteritis and colitis, unspecified: Secondary | ICD-10-CM | POA: Diagnosis not present

## 2015-11-21 ENCOUNTER — Ambulatory Visit: Payer: Self-pay

## 2015-11-27 DIAGNOSIS — R197 Diarrhea, unspecified: Secondary | ICD-10-CM | POA: Diagnosis not present

## 2015-11-27 DIAGNOSIS — K861 Other chronic pancreatitis: Secondary | ICD-10-CM | POA: Diagnosis not present

## 2015-12-11 DIAGNOSIS — R05 Cough: Secondary | ICD-10-CM | POA: Diagnosis not present

## 2015-12-11 DIAGNOSIS — R062 Wheezing: Secondary | ICD-10-CM | POA: Diagnosis not present

## 2015-12-11 DIAGNOSIS — J4530 Mild persistent asthma, uncomplicated: Secondary | ICD-10-CM | POA: Diagnosis not present

## 2015-12-11 DIAGNOSIS — J069 Acute upper respiratory infection, unspecified: Secondary | ICD-10-CM | POA: Diagnosis not present

## 2015-12-11 DIAGNOSIS — K580 Irritable bowel syndrome with diarrhea: Secondary | ICD-10-CM | POA: Diagnosis not present

## 2015-12-11 DIAGNOSIS — D519 Vitamin B12 deficiency anemia, unspecified: Secondary | ICD-10-CM | POA: Diagnosis not present

## 2015-12-11 DIAGNOSIS — R634 Abnormal weight loss: Secondary | ICD-10-CM | POA: Diagnosis not present

## 2015-12-26 ENCOUNTER — Ambulatory Visit (INDEPENDENT_AMBULATORY_CARE_PROVIDER_SITE_OTHER): Payer: Medicare Other | Admitting: Obstetrics and Gynecology

## 2015-12-26 VITALS — BP 109/71 | HR 75 | Wt 147.1 lb

## 2015-12-26 DIAGNOSIS — Z79899 Other long term (current) drug therapy: Secondary | ICD-10-CM | POA: Diagnosis not present

## 2015-12-26 DIAGNOSIS — N926 Irregular menstruation, unspecified: Secondary | ICD-10-CM | POA: Diagnosis not present

## 2015-12-26 LAB — POCT URINE PREGNANCY: Preg Test, Ur: NEGATIVE

## 2015-12-26 MED ORDER — MEDROXYPROGESTERONE ACETATE 150 MG/ML IM SUSP
150.0000 mg | Freq: Once | INTRAMUSCULAR | Status: AC
  Administered 2015-12-26: 150 mg via INTRAMUSCULAR

## 2015-12-26 NOTE — Progress Notes (Signed)
Patient ID: Stacy Pineda, female   DOB: 10-Dec-1980, 35 y.o.   MRN: [id number] Pt presents for depo-provera injection. States she is still having btb on medication and is making an appt with [facility] upon check out. Pt late for medication, it was due by 12/05/15.  UPT results: negative.

## 2015-12-28 ENCOUNTER — Ambulatory Visit: Payer: Medicare Other | Admitting: Psychiatry

## 2015-12-28 ENCOUNTER — Other Ambulatory Visit: Payer: Self-pay | Admitting: *Deleted

## 2015-12-28 DIAGNOSIS — D509 Iron deficiency anemia, unspecified: Secondary | ICD-10-CM

## 2015-12-31 ENCOUNTER — Inpatient Hospital Stay: Payer: Medicare Other | Attending: Oncology | Admitting: Oncology

## 2015-12-31 ENCOUNTER — Inpatient Hospital Stay: Payer: Medicare Other

## 2015-12-31 VITALS — BP 115/75 | HR 73 | Temp 98.3°F | Resp 14 | Wt 148.6 lb

## 2015-12-31 DIAGNOSIS — R5383 Other fatigue: Secondary | ICD-10-CM | POA: Diagnosis not present

## 2015-12-31 DIAGNOSIS — R7989 Other specified abnormal findings of blood chemistry: Secondary | ICD-10-CM | POA: Diagnosis not present

## 2015-12-31 DIAGNOSIS — Z9884 Bariatric surgery status: Secondary | ICD-10-CM | POA: Diagnosis not present

## 2015-12-31 DIAGNOSIS — R5381 Other malaise: Secondary | ICD-10-CM

## 2015-12-31 DIAGNOSIS — D509 Iron deficiency anemia, unspecified: Secondary | ICD-10-CM

## 2015-12-31 DIAGNOSIS — F17210 Nicotine dependence, cigarettes, uncomplicated: Secondary | ICD-10-CM | POA: Diagnosis not present

## 2015-12-31 DIAGNOSIS — Z79899 Other long term (current) drug therapy: Secondary | ICD-10-CM | POA: Diagnosis not present

## 2015-12-31 DIAGNOSIS — R51 Headache: Secondary | ICD-10-CM

## 2015-12-31 DIAGNOSIS — N920 Excessive and frequent menstruation with regular cycle: Secondary | ICD-10-CM | POA: Diagnosis not present

## 2015-12-31 DIAGNOSIS — F4310 Post-traumatic stress disorder, unspecified: Secondary | ICD-10-CM

## 2015-12-31 LAB — CBC WITH DIFFERENTIAL/PLATELET
Basophils Absolute: 0.1 10*3/uL (ref 0–0.1)
Basophils Relative: 1 %
EOS ABS: 0.3 10*3/uL (ref 0–0.7)
Eosinophils Relative: 4 %
HCT: 33.4 % — ABNORMAL LOW (ref 35.0–47.0)
Hemoglobin: 10.6 g/dL — ABNORMAL LOW (ref 12.0–16.0)
LYMPHS ABS: 2.8 10*3/uL (ref 1.0–3.6)
Lymphocytes Relative: 43 %
MCH: 23 pg — ABNORMAL LOW (ref 26.0–34.0)
MCHC: 31.7 g/dL — AB (ref 32.0–36.0)
MCV: 72.5 fL — ABNORMAL LOW (ref 80.0–100.0)
Monocytes Absolute: 0.1 10*3/uL — ABNORMAL LOW (ref 0.2–0.9)
Monocytes Relative: 2 %
NEUTROS ABS: 3.2 10*3/uL (ref 1.4–6.5)
Neutrophils Relative %: 50 %
Platelets: 255 10*3/uL (ref 150–440)
RBC: 4.6 MIL/uL (ref 3.80–5.20)
RDW: 17.4 % — AB (ref 11.5–14.5)
WBC: 6.5 10*3/uL (ref 3.6–11.0)
nRBC: 1 /100 WBC — ABNORMAL HIGH

## 2015-12-31 LAB — FERRITIN: Ferritin: 8 ng/mL — ABNORMAL LOW (ref 11–307)

## 2015-12-31 LAB — IRON AND TIBC
IRON: 57 ug/dL (ref 28–170)
SATURATION RATIOS: 15 % (ref 10.4–31.8)
TIBC: 377 ug/dL (ref 250–450)
UIBC: 320 ug/dL

## 2015-12-31 MED ORDER — SODIUM CHLORIDE 0.9 % IV SOLN
Freq: Once | INTRAVENOUS | Status: AC
  Administered 2015-12-31: 15:00:00 via INTRAVENOUS
  Filled 2015-12-31: qty 1000

## 2015-12-31 MED ORDER — SODIUM CHLORIDE 0.9 % IV SOLN
510.0000 mg | Freq: Once | INTRAVENOUS | Status: AC
  Administered 2015-12-31: 510 mg via INTRAVENOUS
  Filled 2015-12-31: qty 17

## 2015-12-31 NOTE — Progress Notes (Signed)
Patient is feeling fatigued.  Does feel better after receiving iron infusions.

## 2015-12-31 NOTE — Progress Notes (Signed)
[name]  [phone] [phone]) [phone]  ID: Stacy Pineda OB: 1981-04-13  MR#: [id number]  [id number]  Patient Care Team: Ronnell Freshwater, NP as PCP - General (Family Medicine)  CHIEF COMPLAINT:  Chief Complaint  Patient presents with  . Anemia    INTERVAL HISTORY: Patient returns to clinic today for repeat laboratory work and further evaluation. She feels tired but otherwise well. She has no neurologic complaints. She has a good appetite and denies weight loss. She denies any fevers. She denies any chest pain or shortness of breath. She denies any nausea, vomiting, constipation, or diarrhea. She has no urinary complaints. Patient offers no specific complaints today.  REVIEW OF SYSTEMS:   Review of Systems  Constitutional: Positive for malaise/fatigue. Negative for fever.  Respiratory: Negative.   Cardiovascular: Negative.   Gastrointestinal: Negative.   Musculoskeletal: Negative.   Neurological: Positive for weakness.    As per HPI. Otherwise, a complete review of systems is negatve.  PAST MEDICAL HISTORY: Past Medical History  Diagnosis Date  . Depression   . PTSD (post-traumatic stress disorder)   . Anxiety   . Pancreatitis   . Chronic headaches   . Asthma     PAST SURGICAL HISTORY: Past Surgical History  Procedure Laterality Date  . Abdominal surgery    . Cholecystectomy    . Decubitus ulcer excision    . Gastric bypass      FAMILY HISTORY Family History  Problem Relation Age of Onset  . Graves' disease Mother   . Hypertension Mother   . Hyperlipidemia Mother   . Anxiety disorder Father   . Prostate cancer Father   . Depression Brother   . Anxiety disorder Brother   . Anxiety disorder Maternal Aunt   . Depression Maternal Aunt   . Anxiety disorder Paternal Aunt   . Depression Paternal Aunt   . Anxiety disorder Maternal Uncle   . Depression Maternal Uncle   . Anxiety disorder Paternal Uncle   . Depression  Paternal Uncle   . Anxiety disorder Maternal Grandfather   . Depression Maternal Grandfather   . Anxiety disorder Maternal Grandmother   . Depression Maternal Grandmother        ADVANCED DIRECTIVES:    HEALTH MAINTENANCE: Social History  Substance Use Topics  . Smoking status: Current Some Day Smoker    Types: Cigarettes, Cigars, E-cigarettes    Start date: 03/26/2005  . Smokeless tobacco: Never Used  . Alcohol Use: No     Comment: social     Allergies  Allergen Reactions  . Fentanyl Itching  . Foeniculum Vulgare   . Morphine And Related Itching    Itchy.   . Vicodin [Hydrocodone-Acetaminophen] Itching  . Buprenorphine Hcl Itching    Itchy.     Current Outpatient Prescriptions  Medication Sig Dispense Refill  . albuterol (VENTOLIN HFA) 108 (90 BASE) MCG/ACT inhaler     . ASMANEX HFA 100 MCG/ACT AERO     . butalbital-acetaminophen-caffeine (FIORICET WITH CODEINE) 50-325-40-30 MG capsule     . CREON 36000 UNITS CPEP capsule     . escitalopram (LEXAPRO) 20 MG tablet Take 1 tablet (20 mg total) by mouth daily. 1/2 pill twice daily 30 tablet 1  . medroxyPROGESTERone (DEPO-PROVERA) 150 MG/ML injection Inject 1 mL (150 mg total) into the muscle every 3 (three) months. 1 mL 3  . ondansetron (ZOFRAN) 4 MG tablet Take 4 mg by mouth.    . pregabalin (LYRICA) 150 MG capsule Take 150 mg  by mouth. Reported on 12/31/2015    . topiramate (TOPAMAX) 25 MG tablet Take 3 tablets (75 mg total) by mouth daily. 1 pill in am and 2 pills at night 90 tablet 1  . pregabalin (LYRICA) 100 MG capsule Take 100 mg by mouth.     No current facility-administered medications for this visit.    OBJECTIVE: Filed Vitals:   12/31/15 1413  BP: 115/75  Pulse: 73  Temp: 98.3 F (36.8 C)  Resp: 14     Body mass index is 20.15 kg/(m^2).    ECOG FS:0 - Asymptomatic  General: Well-developed, well-nourished, no acute distress. Eyes: Pink conjunctiva, anicteric sclera. Lungs: Clear to auscultation  bilaterally. Heart: Regular rate and rhythm. No rubs, murmurs, or gallops. Abdomen: Soft, nontender, nondistended. No organomegaly noted, normoactive bowel sounds. Musculoskeletal: No edema, cyanosis, or clubbing. Neuro: Alert, answering all questions appropriately. Cranial nerves grossly intact. Skin: No rashes or petechiae noted. Psych: Normal affect.   LAB RESULTS:  Lab Results  Component Value Date   NA 138 07/07/2014   K 3.6 07/07/2014   CL 102 07/07/2014   CO2 28 07/07/2014   GLUCOSE 156* 07/07/2014   BUN 10 07/07/2014   CREATININE 0.72 07/07/2014   CALCIUM 9.5 07/07/2014   PROT 8.4* 07/07/2014   ALBUMIN 3.9 07/07/2014   AST 13* 07/07/2014   ALT 34 07/07/2014   ALKPHOS 113 07/07/2014   BILITOT 0.5 07/07/2014   GFRNONAA >60 07/07/2014   GFRAA >60 07/07/2014    Lab Results  Component Value Date   WBC 6.5 12/31/2015   NEUTROABS PENDING 12/31/2015   HGB 10.6* 12/31/2015   HCT 33.4* 12/31/2015   MCV 72.5* 12/31/2015   PLT 255 12/31/2015   Lab Results  Component Value Date   IRON 57 12/31/2015   TIBC 377 12/31/2015   IRONPCTSAT 15 12/31/2015    Lab Results  Component Value Date   FERRITIN 8* 12/31/2015     STUDIES: No results found.  ASSESSMENT: Iron deficiency anemia.  PLAN:    1. Iron deficiency anemia: Patient's hemoglobin is 10.6 today and her iron stores are decreased. She has a mildly decreased folate and B-12, but the remainder of her laboratory work is within normal limits. Proceed with 510 mg IV feraheme today and next week. Return to clinic in 3 months with repeat laboratory work and further evaluation.  2. Heavy menses: Patient was previously given a referral to gynecology by her primary care physician.  Patient expressed understanding and was in agreement with this plan. She also understands that She can call clinic at any time with any questions, concerns, or complaints.   Mayra Reel, NP   12/31/2015 3:12 PM  Patient was seen and  evaluated independently and I agree with the assessment and plan as dictated above.  Lloyd Huger, MD 01/02/2016 10:14 AM

## 2016-01-01 ENCOUNTER — Encounter: Payer: Self-pay | Admitting: Psychiatry

## 2016-01-01 ENCOUNTER — Ambulatory Visit (INDEPENDENT_AMBULATORY_CARE_PROVIDER_SITE_OTHER): Payer: Medicare Other | Admitting: Psychiatry

## 2016-01-01 VITALS — BP 108/64 | HR 84 | Temp 97.9°F | Ht 72.0 in | Wt 151.0 lb

## 2016-01-01 DIAGNOSIS — F450 Somatization disorder: Secondary | ICD-10-CM

## 2016-01-01 DIAGNOSIS — F331 Major depressive disorder, recurrent, moderate: Secondary | ICD-10-CM

## 2016-01-01 MED ORDER — TOPIRAMATE 100 MG PO TABS: 100.0000 mg | ORAL_TABLET | Freq: Every day | ORAL | Status: DC

## 2016-01-01 MED ORDER — ESCITALOPRAM OXALATE 20 MG PO TABS: 20.0000 mg | ORAL_TABLET | Freq: Every day | ORAL | Status: DC

## 2016-01-01 MED ORDER — PRAZOSIN HCL 5 MG PO CAPS: 5.0000 mg | ORAL_CAPSULE | Freq: Every day | ORAL | Status: DC

## 2016-01-01 NOTE — Progress Notes (Signed)
Psychiatric MD Progress Note  Patient Identification: Stacy Pineda MRN:  [id number] Date of Evaluation:  01/01/2016 Referral Source: [facility]  Chief Complaint:   Chief Complaint    Follow-up; Medication Refill     Visit Diagnosis:    ICD-9-CM ICD-10-CM   1. MDD (major depressive disorder), recurrent episode, moderate (HCC) 296.32 F33.1   2. Somatization disorder 300.81 F45.0    Diagnosis:   Patient Active Problem List   Diagnosis Date Noted  . Chronic pancreatitis ([place]) [K86.1] 09/06/2015  . Headache [R51] 09/06/2015  . Asthma, mild intermittent [J45.20] 09/06/2015  . Iron deficiency anemia [D50.9] 03/29/2015  . Deep vein thrombosis ([place]) [I82.409] 07/20/2014  . Deep vein thrombosis (DVT) ([place]) [I82.409] 07/20/2014  . 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 Chronic abdominal pain [R10.9, G89.29] 02/13/2014  . Chronic diarrhea [K52.9] 02/13/2014  . Acid indigestion [R10.13] 02/13/2014  . Anastomotic ulcer [K28.9] 04/29/2012  . Anxiety state [F41.1] 04/06/2012  . H/O gastrostomy ([place]) [Z93.4] 04/06/2012  . History of gastrostomy ([place]) [Z93.4] 04/06/2012  . Abdominal pain, epigastric [R10.13] 02/18/2012  . Feeling bilious [R11.0] 01/15/2012  . Acute inflammation of the pancreas [K85.9] 12/23/2011  . H/O surgical procedure [Z98.890] 10/17/2011  . History of surgical procedure [Z98.890] 10/17/2011  . Bacterial pneumonia [J15.9] 06/19/2011  . Febrile [R50.9] 06/19/2011  . Clinical depression [F32.9] 02/18/2011  . Disease of female genital organs [N94.9] 02/18/2011  . Essential (primary) hypertension [I10] 02/18/2011  . Family history of cardiovascular disease [Z82.49] 02/18/2011  . Family history of diabetes mellitus [Z83.3] 02/18/2011  . Family history of breast cancer [Z80.3] 02/18/2011  . Morbidity or mortality, unknown cause [R69] 02/18/2011  . Compulsive tobacco user syndrome [F17.200] 02/18/2011  . Extreme obesity ([place]) [E66.01] 02/18/2011  .  Morbid obesity ([place]) [E66.01] 02/18/2011  . Current tobacco use [Z72.0] 02/18/2011  . Illness [R69] 02/18/2011   History of Present Illness:   Pt is a 35 yo  AA female who presented for  follow-up appointment. She reported that she Continues to have anxiety and was shaking her leg during the interview. Patient reported that she has completed her graduation from [employer] and is going to start school at [employer]. Her major is in psychology. She is very excited about the same but reported that she has been very anxious as she has to reintegrate into the society. She was taking online classes for the past 5 years and has been living at the house. Patient reported that she is concerned about going out into the world again. She reported that her medications are helping her and she has been taking Lexapro twice daily. It has been helpful with her anxiety symptoms. Patient reported that the Topamax is also helpful. She is not experiencing any side effects. We discussed at length about her medications. She reported that the Prazosin was helpful in the past but she ran out of the medication. She wants to restart it at a higher dose as the previous dose was not effective.  Patient appeared calm and cooperative during the interview. She denied having any suicidal ideations or plans. She denied having any perceptual disturbances. She is not having any migraine headaches at this time.     Past Medical History:  Past Medical History  Diagnosis Date  . Depression   . PTSD (post-traumatic stress disorder)   . Anxiety   . Pancreatitis   . Chronic headaches   . Asthma     Past Surgical History  Procedure Laterality Date  .  Abdominal surgery    . Cholecystectomy    . Decubitus ulcer excision    . Gastric bypass     Family History:  Family History  Problem Relation Age of Onset  . Graves' disease Mother   . Hypertension Mother   . Hyperlipidemia Mother   . Anxiety disorder Father   . Prostate cancer  Father   . Depression Brother   . Anxiety disorder Brother   . Anxiety disorder Maternal Aunt   . Depression Maternal Aunt   . Anxiety disorder Paternal Aunt   . Depression Paternal Aunt   . Anxiety disorder Maternal Uncle   . Depression Maternal Uncle   . Anxiety disorder Paternal Uncle   . Depression Paternal Uncle   . Anxiety disorder Maternal Grandfather   . Depression Maternal Grandfather   . Anxiety disorder Maternal Grandmother   . Depression Maternal Grandmother    Social History:   Social History   Social History  . Marital Status: Single    Spouse Name: N/A  . Number of Children: N/A  . Years of Education: N/A   Social History Main Topics  . Smoking status: Current Some Day Smoker    Types: Cigarettes, Cigars, E-cigarettes    Start date: 03/26/2005  . Smokeless tobacco: Never Used  . Alcohol Use: No     Comment: social  . Drug Use: 3.00 per week    Special: Marijuana     Comment: last used about 3 days ago  . Sexual Activity: Not Currently   Other Topics Concern  . None   Social History Narrative   Additional Social History:  Patient reported that she has never been married and does not have any children. She dropped out of school and is now trying to finish her classes.  Musculoskeletal: Strength & Muscle Tone: within normal limits Gait & Station: normal Patient leans: N/A  Psychiatric Specialty Exam: Anxiety Symptoms include insomnia.    Drug Problem  Insomnia PMH includes: depression.          Associated symptoms include insomnia.  Past medical history includes anxiety.     Review of Systems  Psychiatric/Behavioral: Positive for depression. The patient has insomnia.     Blood pressure 108/64, pulse 84, temperature 97.9 F (36.6 C), temperature source Tympanic, height 6' (1.829 m), weight 151 lb (68.493 kg), SpO2 99 %.Body mass index is 20.47 kg/(m^2).  General Appearance: Casual  Eye Contact:  Fair  Speech:  Clear and Coherent   Volume:  Normal  Mood:  Anxious  Affect:  Congruent  Thought Process:  Coherent  Orientation:  Full (Time, Place, and Person)  Thought Content:  WDL  Suicidal Thoughts:  No  Homicidal Thoughts:  No  Memory:  Immediate;   Fair  Judgement:  Fair  Insight:  Fair  Psychomotor Activity:  Normal  Concentration:  Fair  Recall:  [employer] of Knowledge:Fair  Language: Fair  Akathisia:  No  Handed:  Right  AIMS (if indicated):    Assets:  Communication Skills Desire for Improvement Physical Health Social Support  ADL's:  Intact  Cognition: WNL  Sleep:     Is the patient at risk to self?  No. Has the patient been a risk to self in the past 6 months?  No. Has the patient been a risk to self within the distant past?  No. Is the patient a risk to others?  No. Has the patient been a risk to others in the past 6 months?  No. Has the patient been a risk to others within the distant past?  No.  Allergies:   Allergies  Allergen Reactions  . Fentanyl Itching  . Foeniculum Vulgare   . Morphine And Related Itching    Itchy.   . Vicodin [Hydrocodone-Acetaminophen] Itching  . Buprenorphine Hcl Itching    Itchy.    Current Medications: Current Outpatient Prescriptions  Medication Sig Dispense Refill  . albuterol (VENTOLIN HFA) 108 (90 BASE) MCG/ACT inhaler     . ASMANEX HFA 100 MCG/ACT AERO     . butalbital-acetaminophen-caffeine (FIORICET WITH CODEINE) 50-325-40-30 MG capsule     . CREON 36000 UNITS CPEP capsule     . escitalopram (LEXAPRO) 20 MG tablet Take 1 tablet (20 mg total) by mouth daily. 1/2 pill twice daily 30 tablet 1  . medroxyPROGESTERone (DEPO-PROVERA) 150 MG/ML injection Inject 1 mL (150 mg total) into the muscle every 3 (three) months. 1 mL 3  . ondansetron (ZOFRAN) 4 MG tablet Take 4 mg by mouth.    . pregabalin (LYRICA) 150 MG capsule Take 150 mg by mouth. Reported on 12/31/2015    . topiramate (TOPAMAX) 25 MG tablet Take 3 tablets (75 mg total) by mouth daily. 1  pill in am and 2 pills at night 90 tablet 1  . pregabalin (LYRICA) 100 MG capsule Take 100 mg by mouth.     No current facility-administered medications for this visit.    Previous Psychotropic Medications:  She has tried Klonopin alprazolam Xanax Prozac Celexa and Abilify trazodone and Latuda in the past.   Substance Abuse History in the last 12 months:  No.  Consequences of Substance Abuse: Negative NA  Medical Decision Making:  Review of Psycho-Social Stressors (1) and Review or order medicine tests (1)  Treatment Plan Summary: Medication management     Depression She will continue on Lexapro 20 mg and will take half a pill twice daily.  Continue Topamax 100 mg daily and she can divide the dose into half pill twice a day  Restart prazosin 5 mg by mouth daily at bedtime and discussed with her about the side effects in detail and she demonstrated understanding.  Advised her to start  start therapy with Elmyra Ricks on a weekly basis and she agreed with the plan.  Follow-up 1 months or earlier depending on her symptoms     More than 50% of the time spent in psychoeducation, counseling and coordination of care.  Time spent with the patient 25 minutes   This note was generated in part or whole with voice recognition software. Voice regonition is usually quite accurate but there are transcription errors that can and very often do occur. I apologize for any typographical errors that were not detected and corrected.     Rainey Pines, MD    5/23/201710:39 AM

## 2016-01-09 DIAGNOSIS — D509 Iron deficiency anemia, unspecified: Secondary | ICD-10-CM | POA: Diagnosis not present

## 2016-01-09 DIAGNOSIS — R109 Unspecified abdominal pain: Secondary | ICD-10-CM | POA: Diagnosis not present

## 2016-01-09 DIAGNOSIS — R634 Abnormal weight loss: Secondary | ICD-10-CM | POA: Diagnosis not present

## 2016-01-10 ENCOUNTER — Ambulatory Visit (INDEPENDENT_AMBULATORY_CARE_PROVIDER_SITE_OTHER): Payer: Medicare Other | Admitting: Licensed Clinical Social Worker

## 2016-01-10 ENCOUNTER — Inpatient Hospital Stay: Payer: Medicare Other | Attending: Oncology

## 2016-01-10 VITALS — BP 114/73 | HR 84 | Temp 97.6°F

## 2016-01-10 DIAGNOSIS — N920 Excessive and frequent menstruation with regular cycle: Secondary | ICD-10-CM | POA: Diagnosis not present

## 2016-01-10 DIAGNOSIS — F331 Major depressive disorder, recurrent, moderate: Secondary | ICD-10-CM | POA: Diagnosis not present

## 2016-01-10 DIAGNOSIS — Z9884 Bariatric surgery status: Secondary | ICD-10-CM | POA: Insufficient documentation

## 2016-01-10 DIAGNOSIS — F4312 Post-traumatic stress disorder, chronic: Secondary | ICD-10-CM | POA: Diagnosis not present

## 2016-01-10 DIAGNOSIS — D509 Iron deficiency anemia, unspecified: Secondary | ICD-10-CM

## 2016-01-10 DIAGNOSIS — R7989 Other specified abnormal findings of blood chemistry: Secondary | ICD-10-CM | POA: Diagnosis not present

## 2016-01-10 MED ORDER — SODIUM CHLORIDE 0.9 % IV SOLN
Freq: Once | INTRAVENOUS | Status: AC
Start: 2016-01-10 — End: 2016-01-10
  Administered 2016-01-10: 15:00:00 via INTRAVENOUS
  Filled 2016-01-10: qty 1000

## 2016-01-10 MED ORDER — SODIUM CHLORIDE 0.9 % IV SOLN
510.0000 mg | Freq: Once | INTRAVENOUS | Status: AC
  Administered 2016-01-10: 510 mg via INTRAVENOUS
  Filled 2016-01-10: qty 17

## 2016-01-11 ENCOUNTER — Ambulatory Visit (INDEPENDENT_AMBULATORY_CARE_PROVIDER_SITE_OTHER): Payer: Medicare Other | Admitting: Obstetrics and Gynecology

## 2016-01-11 ENCOUNTER — Encounter: Payer: Self-pay | Admitting: Obstetrics and Gynecology

## 2016-01-11 VITALS — BP 106/68 | HR 85 | Ht 72.0 in | Wt 146.6 lb

## 2016-01-11 DIAGNOSIS — N760 Acute vaginitis: Secondary | ICD-10-CM

## 2016-01-11 DIAGNOSIS — A499 Bacterial infection, unspecified: Secondary | ICD-10-CM

## 2016-01-11 DIAGNOSIS — B9689 Other specified bacterial agents as the cause of diseases classified elsewhere: Secondary | ICD-10-CM

## 2016-01-11 DIAGNOSIS — N921 Excessive and frequent menstruation with irregular cycle: Secondary | ICD-10-CM

## 2016-01-11 MED ORDER — NORGESTIMATE-ETH ESTRADIOL 0.25-35 MG-MCG PO TABS: 1.0000 | ORAL_TABLET | Freq: Every day | ORAL | Status: DC

## 2016-01-11 MED ORDER — METRONIDAZOLE 0.75 % VA GEL: 1.0000 | Freq: Every day | VAGINAL | Status: DC

## 2016-01-11 NOTE — Patient Instructions (Signed)

## 2016-01-11 NOTE — Progress Notes (Signed)
Subjective:     Patient ID: Stacy Pineda, female   DOB: 1981-04-29, 35 y.o.   MRN: [id number]  HPI Initially seen for pelvic pain- ovarian cysts and DUB- started on Depo and bleeding settle down, cysts resolved, but now is spotting or bleeding daily for about 2 months. Was 2 weeks late getting last Depo injection. Is still having cramping.  Review of Systems See above    Objective:   Physical Exam A&O x4  well groomed female Blood pressure 106/68, pulse 85, height 6' (1.829 m), weight 146 lb 9.6 oz (66.497 kg), last menstrual period 11/10/2015. Thyroid not enlarged Abdomen soft and non-tender Pelvic exam: normal external genitalia, vulva, vagina, cervix, uterus and adnexa, CERVIX: cervical discharge present - copious, bloody and malodorous, WET MOUNT done - results: negative for pathogens, normal epithelial cells, clue cells, excessive bacteria.    Assessment:     BTB on depo BV      Plan:     RX for flagyl sent in and instructed on use Counseled on options to treat BTB- will try OCPs daily until bleeding stops as a prn use. RTC as needed.  Zuhayr Deeney Hughes, CNM

## 2016-01-15 DIAGNOSIS — R05 Cough: Secondary | ICD-10-CM | POA: Diagnosis not present

## 2016-01-15 DIAGNOSIS — D529 Folate deficiency anemia, unspecified: Secondary | ICD-10-CM | POA: Diagnosis not present

## 2016-01-15 DIAGNOSIS — D509 Iron deficiency anemia, unspecified: Secondary | ICD-10-CM | POA: Diagnosis not present

## 2016-01-15 DIAGNOSIS — J069 Acute upper respiratory infection, unspecified: Secondary | ICD-10-CM | POA: Diagnosis not present

## 2016-01-15 DIAGNOSIS — J4530 Mild persistent asthma, uncomplicated: Secondary | ICD-10-CM | POA: Diagnosis not present
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
## 2016-01-24 DIAGNOSIS — R110 Nausea: Secondary | ICD-10-CM | POA: Diagnosis not present

## 2016-01-24 DIAGNOSIS — Z9884 Bariatric surgery status: Secondary | ICD-10-CM | POA: Diagnosis not present

## 2016-01-24 DIAGNOSIS — R634 Abnormal weight loss: Secondary | ICD-10-CM | POA: Diagnosis not present

## 2016-01-28 ENCOUNTER — Ambulatory Visit (INDEPENDENT_AMBULATORY_CARE_PROVIDER_SITE_OTHER): Payer: Medicare Other | Admitting: Licensed Clinical Social Worker

## 2016-01-28 DIAGNOSIS — F4312 Post-traumatic stress disorder, chronic: Secondary | ICD-10-CM | POA: Diagnosis not present

## 2016-01-29 DIAGNOSIS — E669 Obesity, unspecified: Secondary | ICD-10-CM | POA: Insufficient documentation

## 2016-01-29 DIAGNOSIS — K861 Other chronic pancreatitis: Secondary | ICD-10-CM | POA: Diagnosis not present

## 2016-01-29 DIAGNOSIS — F411 Generalized anxiety disorder: Secondary | ICD-10-CM | POA: Diagnosis not present

## 2016-01-29 DIAGNOSIS — Z9889 Other specified postprocedural states: Secondary | ICD-10-CM | POA: Diagnosis not present

## 2016-01-29 DIAGNOSIS — R109 Unspecified abdominal pain: Secondary | ICD-10-CM | POA: Diagnosis not present

## 2016-01-29 DIAGNOSIS — G8929 Other chronic pain: Secondary | ICD-10-CM | POA: Diagnosis not present

## 2016-01-29 DIAGNOSIS — D509 Iron deficiency anemia, unspecified: Secondary | ICD-10-CM | POA: Diagnosis not present

## 2016-01-29 DIAGNOSIS — R143 Flatulence: Secondary | ICD-10-CM

## 2016-01-29 DIAGNOSIS — R142 Eructation: Secondary | ICD-10-CM

## 2016-01-29 DIAGNOSIS — R51 Headache: Secondary | ICD-10-CM | POA: Diagnosis not present

## 2016-01-29 DIAGNOSIS — Z9884 Bariatric surgery status: Secondary | ICD-10-CM | POA: Diagnosis not present

## 2016-01-29 DIAGNOSIS — Z01818 Encounter for other preprocedural examination: Secondary | ICD-10-CM | POA: Insufficient documentation

## 2016-01-29 DIAGNOSIS — R1013 Epigastric pain: Secondary | ICD-10-CM | POA: Diagnosis not present

## 2016-01-29 DIAGNOSIS — F329 Major depressive disorder, single episode, unspecified: Secondary | ICD-10-CM | POA: Diagnosis not present

## 2016-01-29 DIAGNOSIS — K869 Disease of pancreas, unspecified: Secondary | ICD-10-CM | POA: Diagnosis not present

## 2016-01-29 DIAGNOSIS — K529 Noninfective gastroenteritis and colitis, unspecified: Secondary | ICD-10-CM | POA: Diagnosis not present

## 2016-01-29 DIAGNOSIS — R141 Gas pain: Secondary | ICD-10-CM | POA: Insufficient documentation

## 2016-01-29 DIAGNOSIS — K289 Gastrojejunal ulcer, unspecified as acute or chronic, without hemorrhage or perforation: Secondary | ICD-10-CM | POA: Diagnosis not present

## 2016-01-31 NOTE — Progress Notes (Signed)
   THERAPIST PROGRESS NOTE  Session Time: 46  Participation Level: Active  Behavioral Response: CasualAlertAnxious  Type of Therapy: Individual Therapy  Treatment Goals addressed: Coping  Interventions: CBT, Motivational Interviewing, Solution Focused, Strength-based, Supportive and Reframing  Summary: Stacy Pineda is a 35 y.o. female who presents with continued symptoms of her diagnosis.  Discussion on her current stress and worry and the effects it has on her body.  Currently she is having a flare up with her pancreas and she may have to receive a feeding tube.  Worries about others being able to notice her feeding tube.  Discussion of her admission into [facility] [employer] due to her not disclosing her grades at [employer] when she was a young adult.  Discussion on her fighting and not giving up.  Factors that contribute to client's ongoing depressive symptoms were discussed and include real and perceived feelings of isolation, criticism, rejection, shame and guilt.    Suicidal/Homicidal: Nowithout intent/plan  Therapist Response: LCSW provided Patient with ongoing emotional support and encouragement.  Normalized her feelings.  Commended Patient on her progress and reinforced the importance of client staying focused on her own strengths and resources and resiliency. Processed various strategies for dealing with stressors.    Plan: Return again in 1 weeks.  Diagnosis: Axis I: Post Traumatic Stress Disorder    Axis II: No diagnosis    Lubertha South, LCSW 01/29/2016

## 2016-02-05 DIAGNOSIS — Z9884 Bariatric surgery status: Secondary | ICD-10-CM | POA: Diagnosis not present

## 2016-02-05 DIAGNOSIS — Z79899 Other long term (current) drug therapy: Secondary | ICD-10-CM | POA: Diagnosis not present

## 2016-02-06 ENCOUNTER — Ambulatory Visit (INDEPENDENT_AMBULATORY_CARE_PROVIDER_SITE_OTHER): Payer: Medicare Other | Admitting: Licensed Clinical Social Worker

## 2016-02-06 DIAGNOSIS — F4312 Post-traumatic stress disorder, chronic: Secondary | ICD-10-CM | POA: Diagnosis not present

## 2016-02-06 DIAGNOSIS — F331 Major depressive disorder, recurrent, moderate: Secondary | ICD-10-CM

## 2016-02-06 NOTE — Progress Notes (Signed)
   THERAPIST PROGRESS NOTE  Session Time: 56  Participation Level: Active  Behavioral Response: CasualAlertDepressed  Type of Therapy: Individual Therapy  Treatment Goals addressed: Coping and Diagnosis: PTSD  Interventions: Motivational Interviewing, Solution Focused, Strength-based, Supportive, Family Systems and Reframing  Summary: Stacy Pineda is a 35 y.o. female who presents with continued symptoms of her diagnosis.  Explored stressors the past week.  Discussion of her thoughts and feelings regarding her possible feeding tube and how it may impact her life while attending college on site.  Discussion on her feelings toward not knowing if she will be able to enroll at [employer].  Discussion on her trust issues with regards to men.   Suicidal/Homicidal: Nowithout intent/plan  Therapist Response: LCSW provided Patient with ongoing emotional support and encouragement.  Normalized her feelings.  Commended Patient on her progress and reinforced the importance of client staying focused on her own strengths and resources and resiliency. Processed various strategies for dealing with stressors.   Plan: Return again in 1 weeks.  Diagnosis: Axis I: Post Traumatic Stress Disorder    Axis II: No diagnosis    Lubertha South, LCSW 02/06/2016

## 2016-02-07 DIAGNOSIS — Z9884 Bariatric surgery status: Secondary | ICD-10-CM | POA: Diagnosis not present

## 2016-02-13 ENCOUNTER — Ambulatory Visit (INDEPENDENT_AMBULATORY_CARE_PROVIDER_SITE_OTHER): Payer: Medicare Other | Admitting: Licensed Clinical Social Worker

## 2016-02-13 DIAGNOSIS — F331 Major depressive disorder, recurrent, moderate: Secondary | ICD-10-CM | POA: Diagnosis not present

## 2016-02-13 DIAGNOSIS — F4312 Post-traumatic stress disorder, chronic: Secondary | ICD-10-CM

## 2016-02-20 ENCOUNTER — Ambulatory Visit (INDEPENDENT_AMBULATORY_CARE_PROVIDER_SITE_OTHER): Payer: Medicare Other | Admitting: Licensed Clinical Social Worker

## 2016-02-20 DIAGNOSIS — F331 Major depressive disorder, recurrent, moderate: Secondary | ICD-10-CM | POA: Diagnosis not present

## 2016-02-20 DIAGNOSIS — F4312 Post-traumatic stress disorder, chronic: Secondary | ICD-10-CM | POA: Diagnosis not present

## 2016-02-21 DIAGNOSIS — Z9884 Bariatric surgery status: Secondary | ICD-10-CM | POA: Diagnosis not present

## 2016-02-21 DIAGNOSIS — K861 Other chronic pancreatitis: Secondary | ICD-10-CM | POA: Diagnosis not present

## 2016-02-21 DIAGNOSIS — R109 Unspecified abdominal pain: Secondary | ICD-10-CM | POA: Diagnosis not present

## 2016-02-21 DIAGNOSIS — R110 Nausea: Secondary | ICD-10-CM | POA: Diagnosis not present

## 2016-02-28 ENCOUNTER — Ambulatory Visit (INDEPENDENT_AMBULATORY_CARE_PROVIDER_SITE_OTHER): Payer: Medicare Other | Admitting: Licensed Clinical Social Worker

## 2016-02-28 ENCOUNTER — Encounter: Payer: Self-pay | Admitting: Psychiatry

## 2016-02-28 ENCOUNTER — Ambulatory Visit (INDEPENDENT_AMBULATORY_CARE_PROVIDER_SITE_OTHER): Payer: Medicare Other | Admitting: Psychiatry

## 2016-02-28 VITALS — BP 118/75 | HR 83 | Temp 97.7°F | Ht 72.0 in | Wt 148.2 lb

## 2016-02-28 DIAGNOSIS — F450 Somatization disorder: Secondary | ICD-10-CM

## 2016-02-28 DIAGNOSIS — F4312 Post-traumatic stress disorder, chronic: Secondary | ICD-10-CM

## 2016-02-28 MED ORDER — HYDROXYZINE HCL 10 MG PO TABS: 10.0000 mg | ORAL_TABLET | Freq: Two times a day (BID) | ORAL | Status: DC | PRN

## 2016-02-28 MED ORDER — TOPIRAMATE 100 MG PO TABS: 100.0000 mg | ORAL_TABLET | Freq: Every day | ORAL | Status: DC

## 2016-02-28 MED ORDER — ESCITALOPRAM OXALATE 20 MG PO TABS: 20.0000 mg | ORAL_TABLET | Freq: Every day | ORAL | Status: DC

## 2016-02-28 NOTE — Progress Notes (Signed)
Psychiatric MD Progress Note  Patient Identification: Stacy Pineda MRN:  [id number] Date of Evaluation:  02/28/2016 Referral Source: [facility]  Chief Complaint:   Chief Complaint    Follow-up; Medication Refill     Visit Diagnosis:  No diagnosis found. Diagnosis:   Patient Active Problem List   Diagnosis Date Noted  . Flatulence, eructation and gas pain [R14.3, R14.1, R14.2] 01/29/2016  . Adiposity [E66.9] 01/29/2016  . Encounter for other preprocedural examination [id number] 01/29/2016  . Chronic pancreatitis ([place]) [K86.1] 09/06/2015  . Headache [R51] 09/06/2015  . Asthma, mild intermittent [J45.20] 09/06/2015  . Iron deficiency anemia [D50.9] 03/29/2015  . Deep vein thrombosis ([place]) [I82.409] 07/20/2014  . Deep vein thrombosis (DVT) ([place]) [I82.409] 07/20/2014  . 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 Chronic abdominal pain [R10.9, G89.29] 02/13/2014  . Chronic diarrhea [K52.9] 02/13/2014  . Acid indigestion [R10.13] 02/13/2014  . Anastomotic ulcer [K28.9] 04/29/2012  . Anxiety state [F41.1] 04/06/2012  . H/O gastrostomy ([place]) [Z93.4] 04/06/2012  . History of gastrostomy ([place]) [Z93.4] 04/06/2012  . Abdominal pain, epigastric [R10.13] 02/18/2012  . Feeling bilious [R11.0] 01/15/2012  . Acute inflammation of the pancreas [K85.9] 12/23/2011  . H/O surgical procedure [Z98.890] 10/17/2011  . History of surgical procedure [Z98.890] 10/17/2011  . Bacterial pneumonia [J15.9] 06/19/2011  . Febrile [R50.9] 06/19/2011  . Clinical depression [F32.9] 02/18/2011  . Disease of female genital organs [N94.9] 02/18/2011  . Essential (primary) hypertension [I10] 02/18/2011  . Family history of cardiovascular disease [Z82.49] 02/18/2011  . Family history of diabetes mellitus [Z83.3] 02/18/2011  . Family history of breast cancer [Z80.3] 02/18/2011  . Morbidity or mortality, unknown cause [R69] 02/18/2011  . Compulsive tobacco user syndrome [F17.200] 02/18/2011  .  Extreme obesity ([place]) [E66.01] 02/18/2011  . Morbid obesity ([place]) [E66.01] 02/18/2011  . Current tobacco use [Z72.0] 02/18/2011  . Illness [R69] 02/18/2011   History of Present Illness:   Pt is a 35 yo  AA female who presented for  follow-up appointment. She reported that she Is looking forward to start her school at [employer] in a month. She has been doing therapy with Elmyra Ricks and is feeling better. She reported that she has never been in the school for the past 20 years and is excited about the same. Patient reported that she has noticed some improvement in her anxiety symptoms with the combination of the medications and the therapy. She has stopped taking the past as son as she reported that it was not helping with her nightmares. She is compliant with her medications and takes Lexapro twice daily. She currently denied having any suicidal homicidal ideations or plans. We discussed at length about her school preparation and she demonstrated understanding. She smells strongly of cannabis.   Patient appeared calm and cooperative during the interview.    Past Medical History:  Past Medical History  Diagnosis Date  . Depression   . PTSD (post-traumatic stress disorder)   . Anxiety   . Pancreatitis   . Chronic headaches   . Asthma     Past Surgical History  Procedure Laterality Date  . Abdominal surgery    . Cholecystectomy    . Decubitus ulcer excision    . Gastric bypass     Family History:  Family History  Problem Relation Age of Onset  . Graves' disease Mother   . Hypertension Mother   . Hyperlipidemia Mother   . Anxiety disorder Father   . Prostate cancer Father   . Depression  Brother   . Anxiety disorder Brother   . Anxiety disorder Maternal Aunt   . Depression Maternal Aunt   . Anxiety disorder Paternal Aunt   . Depression Paternal Aunt   . Anxiety disorder Maternal Uncle   . Depression Maternal Uncle   . Anxiety disorder Paternal Uncle   . Depression Paternal  Uncle   . Anxiety disorder Maternal Grandfather   . Depression Maternal Grandfather   . Anxiety disorder Maternal Grandmother   . Depression Maternal Grandmother    Social History:   Social History   Social History  . Marital Status: Single    Spouse Name: N/A  . Number of Children: N/A  . Years of Education: N/A   Social History Main Topics  . Smoking status: Current Some Day Smoker    Types: Cigarettes, Cigars, E-cigarettes    Start date: 03/26/2005  . Smokeless tobacco: Never Used  . Alcohol Use: No     Comment: social  . Drug Use: 3.00 per week    Special: Marijuana     Comment: last used about 3 days ago  . Sexual Activity: Not Currently   Other Topics Concern  . None   Social History Narrative   Additional Social History:  Patient reported that she has never been married and does not have any children. She dropped out of school and is now trying to finish her classes.  Musculoskeletal: Strength & Muscle Tone: within normal limits Gait & Station: normal Patient leans: N/A  Psychiatric Specialty Exam: Anxiety Symptoms include insomnia.    Drug Problem  Insomnia PMH includes: depression.          Associated symptoms include insomnia.  Past medical history includes anxiety.     Review of Systems  Psychiatric/Behavioral: Positive for depression. The patient has insomnia.     Blood pressure 118/75, pulse 83, temperature 97.7 F (36.5 C), temperature source Tympanic, height 6' (1.829 m), weight 148 lb 3.2 oz (67.223 kg), last menstrual period 12/29/2015, SpO2 98 %.Body mass index is 20.1 kg/(m^2).  General Appearance: Casual  Eye Contact:  Fair  Speech:  Clear and Coherent  Volume:  Normal  Mood:  Anxious  Affect:  Congruent  Thought Process:  Coherent  Orientation:  Full (Time, Place, and Person)  Thought Content:  WDL  Suicidal Thoughts:  No  Homicidal Thoughts:  No  Memory:  Immediate;   Fair  Judgement:  Fair  Insight:  Fair  Psychomotor  Activity:  Normal  Concentration:  Fair  Recall:  [employer] of Knowledge:Fair  Language: Fair  Akathisia:  No  Handed:  Right  AIMS (if indicated):    Assets:  Communication Skills Desire for Improvement Physical Health Social Support  ADL's:  Intact  Cognition: WNL  Sleep:     Is the patient at risk to self?  No. Has the patient been a risk to self in the past 6 months?  No. Has the patient been a risk to self within the distant past?  No. Is the patient a risk to others?  No. Has the patient been a risk to others in the past 6 months?  No. Has the patient been a risk to others within the distant past?  No.  Allergies:   Allergies  Allergen Reactions  . Fentanyl Itching  . Foeniculum Vulgare   . Morphine And Related Itching    Itchy.   . Vicodin [Hydrocodone-Acetaminophen] Itching  . Buprenorphine Hcl Itching    Itchy.  Current Medications: Current Outpatient Prescriptions  Medication Sig Dispense Refill  . albuterol (VENTOLIN HFA) 108 (90 BASE) MCG/ACT inhaler     . ASMANEX HFA 100 MCG/ACT AERO     . butalbital-acetaminophen-caffeine (FIORICET WITH CODEINE) 50-325-40-30 MG capsule     . CREON 36000 UNITS CPEP capsule     . escitalopram (LEXAPRO) 20 MG tablet Take 1 tablet (20 mg total) by mouth daily. 1/2 pill twice daily 30 tablet 1  . medroxyPROGESTERone (DEPO-PROVERA) 150 MG/ML injection Inject 1 mL (150 mg total) into the muscle every 3 (three) months. 1 mL 3  . metroNIDAZOLE (METROGEL) 0.75 % vaginal gel Place 1 Applicatorful vaginally at bedtime. Apply one applicatorful to vagina at bedtime for 5 days 70 g 1  . norgestimate-ethinyl estradiol (ORTHO-CYCLEN,SPRINTEC,PREVIFEM) 0.25-35 MG-MCG tablet Take 1 tablet by mouth daily. 1 Package 11  . ondansetron (ZOFRAN) 4 MG tablet Take 4 mg by mouth.    . pregabalin (LYRICA) 200 MG capsule Take 200 mg by mouth.    . promethazine (PHENERGAN) 25 MG tablet Take 25 mg by mouth.    . topiramate (TOPAMAX) 100 MG tablet  Take 1 tablet (100 mg total) by mouth daily. 30 tablet 1  . pregabalin (LYRICA) 100 MG capsule Take 100 mg by mouth.     No current facility-administered medications for this visit.    Previous Psychotropic Medications:  She has tried Klonopin alprazolam Xanax Prozac Celexa and Abilify trazodone and Latuda in the past.   Substance Abuse History in the last 12 months:  No.  Consequences of Substance Abuse: Negative NA  Medical Decision Making:  Review of Psycho-Social Stressors (1) and Review or order medicine tests (1)  Treatment Plan Summary: Medication management     Depression She will continue on Lexapro 20 mg and will take half a pill twice daily.  Continue Topamax 100 mg daily .   Follow-up 1 months or earlier depending on her symptoms     More than 50% of the time spent in psychoeducation, counseling and coordination of care.    This note was generated in part or whole with voice recognition software. Voice regonition is usually quite accurate but there are transcription errors that can and very often do occur. I apologize for any typographical errors that were not detected and corrected.     Rainey Pines, MD    7/20/201710:28 AM

## 2016-02-29 NOTE — Progress Notes (Signed)
   THERAPIST PROGRESS NOTE  Session Time: 65min  Participation Level: Active  Behavioral Response: CasualAlertAnxious  Type of Therapy: Individual Therapy  Treatment Goals addressed: Anxiety and Coping  Interventions: CBT, Motivational Interviewing, Solution Focused, Supportive and Reframing  Summary: Stacy Pineda is a 35 y.o. female who presents with continued symptoms of her diagnosis. Patient was excited to discuss that she was accepted into [employer] after a lengthy appeal process.  Patient reports that she has a list of things to accomplish before classes begin toward the end of August.  Patient reports being overwhelmed with the list.  Patient reports she is willing to take this piece by piece and accomplish at least 1 or 2 task per day.  Patient reports completing to tasks per day does not seem to be overwhelming.  Patient reports that she continues to have difficulty with gaining weight due to her having pancreatitis.  Patient reports that she does not want to have a feeding tube or bag while attending college.  Patient reports she still has nightmares and thoughts of her past trauma.  Patient continues to struggle with the relationship that she has with her parents.  Patient wants to increase her self-esteem and self-confidence.  Suicidal/Homicidal: Nowithout intent/plan  Therapist Response: LCSW provided Patient with ongoing emotional support and encouragement.  Normalized her feelings.  Commended Patient on her progress and reinforced the importance of client staying focused on her own strengths and resources and resiliency. Processed various strategies for dealing with stressors.   Plan: Return again in [place].  Diagnosis: Axis I: Post Traumatic Stress Disorder and Depression    Axis II: No diagnosis    Lubertha South, LCSW 02/20/2016

## 2016-02-29 NOTE — Progress Notes (Signed)
   THERAPIST PROGRESS NOTE  Session Time: 52min  Participation Level: Active  Behavioral Response: CasualAlertDepressed  Type of Therapy: Individual Therapy  Treatment Goals addressed: Coping and Diagnosis: Depression  Interventions: CBT, Motivational Interviewing, Solution Focused, Strength-based, Supportive, Family Systems and Reframing  Summary: Stacy Pineda is a 35 y.o. female who presents with continued symptoms of her diagnosis.  Patient was open and receptive to working on how to decrease symptoms.  Patient states that she wants to do more self affirmations and meditation.  Patient continues to have nightmares sadness and lack of motivation.  Patient continues to have medical concerns with the pancreas which is causing her to isolate herself.  Patient was accepted into [employer].  Patient is apprehensive about attending college.     Suicidal/Homicidal: Nowithout intent/plan  Therapist Response: LCSW provided Patient with ongoing emotional support and encouragement.  Normalized her feelings.  Commended Patient on her progress and reinforced the importance of client staying focused on her own strengths and resources and resiliency. Processed various strategies for dealing with stressors.    Plan: Return again in 1 weeks.  Diagnosis: Axis I: Post Traumatic Stress Disorder and Depression    Axis II: No diagnosis    Lubertha South, LCSW 02/13/2016

## 2016-03-04 NOTE — Progress Notes (Signed)
   THERAPIST PROGRESS NOTE  Session Time: 40min  Participation Level: Active  Behavioral Response: Casual and NeatAlertDepressed  Type of Therapy: Individual Therapy  Treatment Goals addressed: Coping  Interventions: CBT, Motivational Interviewing, Solution Focused and Supportive  Summary: Stacy Pineda is a 35 y.o. female who presents with continued symptoms of her diagnosis. Prompted patient to rate her symptoms on a scale of 1-10.  Patient rated her symptoms a 6.  Patient continues to be joyed about being accepted into [employer].  Although she is excited about attending a University she is overwhelmed about the task that she has to accomplished in 4 weeks.  Discussion of her interaction/relationship with her parents.  Assisted her with understanding why she and her parents have a strained relationship.  Discussed how to communicate effectively with her parents to vent her frustration about her childhood and their support for her.  Discussion of the trauma that she has experienced while growing up.  Verbal discussion of coping skills that may be useful    Suicidal/Homicidal: Nowithout intent/plan  Therapist Response: LCSW provided Patient with ongoing emotional support and encouragement.  Normalized her feelings.  Commended Patient on her progress and reinforced the importance of client staying focused on her own strengths and resources and resiliency. Processed various strategies for dealing with stressors.   Plan: Return again in 1 weeks.  Diagnosis: Axis I: Post Traumatic Stress Disorder    Axis II: No diagnosis    Lubertha South, LCSW 02/28/2016

## 2016-03-12 ENCOUNTER — Ambulatory Visit (INDEPENDENT_AMBULATORY_CARE_PROVIDER_SITE_OTHER): Payer: Medicare Other | Admitting: Obstetrics and Gynecology

## 2016-03-12 VITALS — BP 122/84 | HR 72 | Wt 149.3 lb

## 2016-03-12 DIAGNOSIS — N926 Irregular menstruation, unspecified: Secondary | ICD-10-CM | POA: Diagnosis not present

## 2016-03-12 MED ORDER — MEDROXYPROGESTERONE ACETATE 150 MG/ML IM SUSP
150.0000 mg | Freq: Once | INTRAMUSCULAR | Status: AC
  Administered 2016-03-12: 150 mg via INTRAMUSCULAR

## 2016-03-12 NOTE — Progress Notes (Signed)
Patient ID: Stacy Pineda, female   DOB: Jan 02, 1981, 35 y.o.   MRN: [id number] Pt presents for depo-provera injection for irregular menses. No c/o side effects.

## 2016-03-13 DIAGNOSIS — Z9884 Bariatric surgery status: Secondary | ICD-10-CM | POA: Diagnosis not present

## 2016-03-19 DIAGNOSIS — Z9884 Bariatric surgery status: Secondary | ICD-10-CM | POA: Diagnosis not present

## 2016-03-19 DIAGNOSIS — R1110 Vomiting, unspecified: Secondary | ICD-10-CM | POA: Diagnosis not present

## 2016-03-19 DIAGNOSIS — R112 Nausea with vomiting, unspecified: Secondary | ICD-10-CM | POA: Diagnosis not present

## 2016-03-28 DIAGNOSIS — R933 Abnormal findings on diagnostic imaging of other parts of digestive tract: Secondary | ICD-10-CM | POA: Diagnosis not present

## 2016-03-28 DIAGNOSIS — J45909 Unspecified asthma, uncomplicated: Secondary | ICD-10-CM | POA: Diagnosis not present

## 2016-03-28 DIAGNOSIS — K311 Adult hypertrophic pyloric stenosis: Secondary | ICD-10-CM | POA: Diagnosis not present

## 2016-03-28 DIAGNOSIS — I10 Essential (primary) hypertension: Secondary | ICD-10-CM | POA: Diagnosis not present

## 2016-03-28 DIAGNOSIS — R109 Unspecified abdominal pain: Secondary | ICD-10-CM | POA: Diagnosis not present

## 2016-03-28 DIAGNOSIS — K219 Gastro-esophageal reflux disease without esophagitis: Secondary | ICD-10-CM | POA: Diagnosis not present

## 2016-03-28 DIAGNOSIS — Z87891 Personal history of nicotine dependence: Secondary | ICD-10-CM | POA: Diagnosis not present

## 2016-03-28 DIAGNOSIS — R112 Nausea with vomiting, unspecified: Secondary | ICD-10-CM | POA: Diagnosis not present

## 2016-03-28 DIAGNOSIS — Z9884 Bariatric surgery status: Secondary | ICD-10-CM | POA: Diagnosis not present

## 2016-03-28 DIAGNOSIS — R634 Abnormal weight loss: Secondary | ICD-10-CM | POA: Diagnosis not present

## 2016-03-28 DIAGNOSIS — R627 Adult failure to thrive: Secondary | ICD-10-CM | POA: Diagnosis not present

## 2016-03-31 ENCOUNTER — Other Ambulatory Visit: Payer: Self-pay | Admitting: *Deleted

## 2016-03-31 DIAGNOSIS — Z23 Encounter for immunization: Secondary | ICD-10-CM | POA: Diagnosis not present

## 2016-03-31 DIAGNOSIS — D509 Iron deficiency anemia, unspecified: Secondary | ICD-10-CM

## 2016-03-31 NOTE — Progress Notes (Deleted)
[place]  Telephone:(336) [phone] Fax:(336) [phone]  ID: Yared Martinique OB: 1981-01-20  MR#: [id number]  [id number]  Patient Care Team: Ronnell Freshwater, NP as PCP - General (Family Medicine)  CHIEF COMPLAINT: Iron deficiency anemia.  INTERVAL HISTORY: Patient returns to clinic today for repeat laboratory work and further evaluation. She feels tired but otherwise well. She has no neurologic complaints. She has a good appetite and denies weight loss. She denies any fevers. She denies any chest pain or shortness of breath. She denies any nausea, vomiting, constipation, or diarrhea. She has no urinary complaints. Patient offers no specific complaints today.  REVIEW OF SYSTEMS:   Review of Systems  Constitutional: Positive for malaise/fatigue. Negative for fever.  Respiratory: Negative.   Cardiovascular: Negative.   Gastrointestinal: Negative.   Musculoskeletal: Negative.   Neurological: Positive for weakness.    As per HPI. Otherwise, a complete review of systems is negatve.  PAST MEDICAL HISTORY: Past Medical History:  Diagnosis Date  . Anxiety   . Asthma   . Chronic headaches   . Depression   . Pancreatitis   . PTSD (post-traumatic stress disorder)     PAST SURGICAL HISTORY: Past Surgical History:  Procedure Laterality Date  . ABDOMINAL SURGERY    . CHOLECYSTECTOMY    . DECUBITUS ULCER EXCISION    . GASTRIC BYPASS      FAMILY HISTORY Family History  Problem Relation Age of Onset  . Graves' disease Mother   . Hypertension Mother   . Hyperlipidemia Mother   . Anxiety disorder Father   . Prostate cancer Father   . Depression Brother   . Anxiety disorder Brother   . Anxiety disorder Maternal Aunt   . Depression Maternal Aunt   . Anxiety disorder Paternal Aunt   . Depression Paternal Aunt   . Anxiety disorder Maternal Uncle   . Depression Maternal Uncle   . Anxiety disorder Paternal Uncle   . Depression Paternal Uncle   . Anxiety  disorder Maternal Grandfather   . Depression Maternal Grandfather   . Anxiety disorder Maternal Grandmother   . Depression Maternal Grandmother        ADVANCED DIRECTIVES:    HEALTH MAINTENANCE: Social History  Substance Use Topics  . Smoking status: Current Some Day Smoker    Types: Cigarettes, Cigars, E-cigarettes    Start date: 03/26/2005  . Smokeless tobacco: Never Used  . Alcohol use No     Comment: social     Allergies  Allergen Reactions  . Fentanyl Itching  . Foeniculum Vulgare   . Morphine And Related Itching    Itchy.   . Vicodin [Hydrocodone-Acetaminophen] Itching  . Buprenorphine Hcl Itching    Itchy.     Current Outpatient Prescriptions  Medication Sig Dispense Refill  . albuterol (VENTOLIN HFA) 108 (90 BASE) MCG/ACT inhaler     . ASMANEX HFA 100 MCG/ACT AERO     . butalbital-acetaminophen-caffeine (FIORICET WITH CODEINE) 50-325-40-30 MG capsule     . CREON 36000 UNITS CPEP capsule     . escitalopram (LEXAPRO) 20 MG tablet Take 1 tablet (20 mg total) by mouth daily. 1/2 pill twice daily 30 tablet 1  . hydrOXYzine (ATARAX/VISTARIL) 10 MG tablet Take 1 tablet (10 mg total) by mouth 2 (two) times daily as needed. 60 tablet 1  . medroxyPROGESTERone (DEPO-PROVERA) 150 MG/ML injection Inject 1 mL (150 mg total) into the muscle every 3 (three) months. 1 mL 3  . metroNIDAZOLE (METROGEL) 0.75 % vaginal gel Place  1 Applicatorful vaginally at bedtime. Apply one applicatorful to vagina at bedtime for 5 days 70 g 1  . norgestimate-ethinyl estradiol (ORTHO-CYCLEN,SPRINTEC,PREVIFEM) 0.25-35 MG-MCG tablet Take 1 tablet by mouth daily. 1 Package 11  . ondansetron (ZOFRAN) 4 MG tablet Take 4 mg by mouth.    . pregabalin (LYRICA) 100 MG capsule Take 100 mg by mouth.    . pregabalin (LYRICA) 200 MG capsule Take 200 mg by mouth.    . promethazine (PHENERGAN) 25 MG tablet Take 25 mg by mouth.    . topiramate (TOPAMAX) 100 MG tablet Take 1 tablet (100 mg total) by mouth daily. 30  tablet 1   No current facility-administered medications for this visit.     OBJECTIVE: There were no vitals filed for this visit.   There is no height or weight on file to calculate BMI.    ECOG FS:0 - Asymptomatic  General: Well-developed, well-nourished, no acute distress. Eyes: Pink conjunctiva, anicteric sclera. Lungs: Clear to auscultation bilaterally. Heart: Regular rate and rhythm. No rubs, murmurs, or gallops. Abdomen: Soft, nontender, nondistended. No organomegaly noted, normoactive bowel sounds. Musculoskeletal: No edema, cyanosis, or clubbing. Neuro: Alert, answering all questions appropriately. Cranial nerves grossly intact. Skin: No rashes or petechiae noted. Psych: Normal affect.   LAB RESULTS:  Lab Results  Component Value Date   NA 138 07/07/2014   K 3.6 07/07/2014   CL 102 07/07/2014   CO2 28 07/07/2014   GLUCOSE 156 (H) 07/07/2014   BUN 10 07/07/2014   CREATININE 0.72 07/07/2014   CALCIUM 9.5 07/07/2014   PROT 8.4 (H) 07/07/2014   ALBUMIN 3.9 07/07/2014   AST 13 (L) 07/07/2014   ALT 34 07/07/2014   ALKPHOS 113 07/07/2014   BILITOT 0.5 07/07/2014   GFRNONAA >60 07/07/2014   GFRAA >60 07/07/2014    Lab Results  Component Value Date   WBC 6.5 12/31/2015   NEUTROABS 3.2 12/31/2015   HGB 10.6 (L) 12/31/2015   HCT 33.4 (L) 12/31/2015   MCV 72.5 (L) 12/31/2015   PLT 255 12/31/2015   Lab Results  Component Value Date   IRON 57 12/31/2015   TIBC 377 12/31/2015   IRONPCTSAT 15 12/31/2015    Lab Results  Component Value Date   FERRITIN 8 (L) 12/31/2015     STUDIES: No results found.  ASSESSMENT: Iron deficiency anemia.  PLAN:    1. Iron deficiency anemia: Patient's hemoglobin is 10.6 today and her iron stores are decreased. She has a mildly decreased folate and B-12, but the remainder of her laboratory work is within normal limits. Proceed with 510 mg IV feraheme today and next week. Return to clinic in 3 months with repeat laboratory work  and further evaluation.  2. Heavy menses: Patient was previously given a referral to gynecology by her primary care physician.  Patient expressed understanding and was in agreement with this plan. She also understands that She can call clinic at any time with any questions, concerns, or complaints.   Lloyd Huger, MD   03/31/2016 12:42 AM

## 2016-04-01 ENCOUNTER — Inpatient Hospital Stay: Payer: Medicare Other

## 2016-04-01 ENCOUNTER — Inpatient Hospital Stay: Payer: Medicare Other | Admitting: Oncology

## 2016-04-07 ENCOUNTER — Emergency Department
Admission: EM | Admit: 2016-04-07 | Discharge: 2016-04-07 | Disposition: A | Discharge status: Home / Self Care | Payer: Medicare Other | Attending: Emergency Medicine | Admitting: Emergency Medicine

## 2016-04-07 ENCOUNTER — Encounter: Payer: Self-pay | Admitting: Emergency Medicine

## 2016-04-07 DIAGNOSIS — R519 Headache, unspecified: Secondary | ICD-10-CM

## 2016-04-07 DIAGNOSIS — J4520 Mild intermittent asthma, uncomplicated: Secondary | ICD-10-CM | POA: Insufficient documentation

## 2016-04-07 DIAGNOSIS — D649 Anemia, unspecified: Secondary | ICD-10-CM | POA: Insufficient documentation

## 2016-04-07 DIAGNOSIS — F17210 Nicotine dependence, cigarettes, uncomplicated: Secondary | ICD-10-CM | POA: Insufficient documentation

## 2016-04-07 DIAGNOSIS — I10 Essential (primary) hypertension: Secondary | ICD-10-CM | POA: Insufficient documentation

## 2016-04-07 DIAGNOSIS — G43909 Migraine, unspecified, not intractable, without status migrainosus: Secondary | ICD-10-CM | POA: Diagnosis present

## 2016-04-07 DIAGNOSIS — D500 Iron deficiency anemia secondary to blood loss (chronic): Secondary | ICD-10-CM | POA: Diagnosis not present

## 2016-04-07 DIAGNOSIS — R51 Headache: Secondary | ICD-10-CM | POA: Diagnosis not present

## 2016-04-07 LAB — CBC WITH DIFFERENTIAL/PLATELET
Basophils Absolute: 0.1 10*3/uL (ref 0–0.1)
Basophils Relative: 1 %
EOS PCT: 3 %
Eosinophils Absolute: 0.2 10*3/uL (ref 0–0.7)
HEMATOCRIT: 30.7 % — AB (ref 35.0–47.0)
Hemoglobin: 9.9 g/dL — ABNORMAL LOW (ref 12.0–16.0)
LYMPHS ABS: 2.8 10*3/uL (ref 1.0–3.6)
LYMPHS PCT: 43 %
MCH: 23.8 pg — AB (ref 26.0–34.0)
MCHC: 32.3 g/dL (ref 32.0–36.0)
MCV: 73.8 fL — AB (ref 80.0–100.0)
Monocytes Absolute: 0.3 10*3/uL (ref 0.2–0.9)
Monocytes Relative: 5 %
NEUTROS ABS: 3.1 10*3/uL (ref 1.4–6.5)
Neutrophils Relative %: 48 %
PLATELETS: 223 10*3/uL (ref 150–440)
RBC: 4.15 MIL/uL (ref 3.80–5.20)
RDW: 16.2 % — ABNORMAL HIGH (ref 11.5–14.5)
WBC: 6.4 10*3/uL (ref 3.6–11.0)

## 2016-04-07 LAB — COMPREHENSIVE METABOLIC PANEL
ALK PHOS: 79 U/L (ref 38–126)
ALT: 14 U/L (ref 14–54)
AST: 16 U/L (ref 15–41)
Albumin: 3.6 g/dL (ref 3.5–5.0)
Anion gap: 6 (ref 5–15)
BILIRUBIN TOTAL: 0.4 mg/dL (ref 0.3–1.2)
BUN: 13 mg/dL (ref 6–20)
CALCIUM: 8.5 mg/dL — AB (ref 8.9–10.3)
CHLORIDE: 118 mmol/L — AB (ref 101–111)
CO2: 18 mmol/L — ABNORMAL LOW (ref 22–32)
CREATININE: 0.46 mg/dL (ref 0.44–1.00)
Glucose, Bld: 86 mg/dL (ref 65–99)
Potassium: 3.1 mmol/L — ABNORMAL LOW (ref 3.5–5.1)
Sodium: 142 mmol/L (ref 135–145)
TOTAL PROTEIN: 6.3 g/dL — AB (ref 6.5–8.1)

## 2016-04-07 MED ORDER — METOCLOPRAMIDE HCL 5 MG/ML IJ SOLN
10.0000 mg | Freq: Once | INTRAMUSCULAR | Status: AC
  Administered 2016-04-07: 10 mg via INTRAVENOUS
  Filled 2016-04-07: qty 2

## 2016-04-07 MED ORDER — BUTALBITAL-APAP-CAFFEINE 50-325-40 MG PO TABS: 1.0000 | ORAL_TABLET | Freq: Four times a day (QID) | ORAL | 0 refills | Status: DC | PRN

## 2016-04-07 MED ORDER — KETOROLAC TROMETHAMINE 30 MG/ML IJ SOLN
30.0000 mg | Freq: Once | INTRAMUSCULAR | Status: AC
  Administered 2016-04-07: 30 mg via INTRAVENOUS
  Filled 2016-04-07: qty 1

## 2016-04-07 MED ORDER — DIPHENHYDRAMINE HCL 50 MG/ML IJ SOLN
25.0000 mg | Freq: Once | INTRAMUSCULAR | Status: AC
  Administered 2016-04-07: 25 mg via INTRAVENOUS
  Filled 2016-04-07: qty 1

## 2016-04-07 MED ORDER — SODIUM CHLORIDE 0.9 % IV SOLN
Freq: Once | INTRAVENOUS | Status: AC
  Administered 2016-04-07: 15:00:00 via INTRAVENOUS

## 2016-04-07 MED ORDER — HYDROMORPHONE HCL 1 MG/ML IJ SOLN
1.0000 mg | Freq: Once | INTRAMUSCULAR | Status: AC
  Administered 2016-04-07: 1 mg via INTRAVENOUS
  Filled 2016-04-07: qty 1

## 2016-04-07 NOTE — ED Notes (Signed)
D/c inst to pt.   Pt signed  E signature.  Iv removed.

## 2016-04-07 NOTE — ED Notes (Signed)
States she developed a general headache last Tuesday  min relief fiorcet  Positive n/v and photosenstivity  Placed in darkened room

## 2016-04-07 NOTE — ED Provider Notes (Signed)
[facility] Emergency Department Provider Note        Time seen: ----------------------------------------- 2:30 PM on 04/07/2016 -----------------------------------------    I have reviewed the triage vital signs and the nursing notes.   HISTORY  Chief Complaint Migraine    HPI Stacy Pineda is a 35 y.o. female who presents ER being brought by private vehicle for migraine that has been going on for a week. Patient states the pain is all over her head, nothing makes it better, lights seems to make it worse. She describes as a pounding sensation. She's been avoiding NSAIDs due to history of ulcers. Pain is unrelieved with over-the-counter medicines.   Past Medical History:  Diagnosis Date  . Anxiety   . Asthma   . Chronic headaches   . Depression   . Pancreatitis   . PTSD (post-traumatic stress disorder)     Patient Active Problem List   Diagnosis Date Noted  . Flatulence, eructation and gas pain 01/29/2016  . Adiposity 01/29/2016  . Encounter for other preprocedural examination 01/29/2016  . Chronic pancreatitis ([place]) 09/06/2015  . Headache 09/06/2015  . Asthma, mild intermittent 09/06/2015  . Iron deficiency anemia 03/29/2015  . Deep vein thrombosis ([place]) 07/20/2014  . Deep vein thrombosis (DVT) ([place]) 07/20/2014  . Disorder of pancreatic duct 02/13/2014  . Chronic abdominal pain 02/13/2014  . Chronic diarrhea 02/13/2014  . Acid indigestion 02/13/2014  . Anastomotic ulcer 04/29/2012  . Anxiety state 04/06/2012  . H/O gastrostomy ([place]) 04/06/2012  . History of gastrostomy ([place]) 04/06/2012  . Abdominal pain, epigastric 02/18/2012  . Feeling bilious 01/15/2012  . Acute inflammation of the pancreas 12/23/2011  . H/O surgical procedure 10/17/2011  . History of surgical procedure 10/17/2011  . Bacterial pneumonia 06/19/2011  . Febrile 06/19/2011  . Clinical depression 02/18/2011  . Disease of female genital organs 02/18/2011  . Essential  (primary) hypertension 02/18/2011  . Family history of cardiovascular disease 02/18/2011  . Family history of diabetes mellitus 02/18/2011  . Family history of breast cancer 02/18/2011  . Morbidity or mortality, unknown cause 02/18/2011  . Compulsive tobacco user syndrome 02/18/2011  . Extreme obesity ([place]) 02/18/2011  . Morbid obesity ([place]) 02/18/2011  . Current tobacco use 02/18/2011  . Illness 02/18/2011    Past Surgical History:  Procedure Laterality Date  . ABDOMINAL SURGERY    . CHOLECYSTECTOMY    . DECUBITUS ULCER EXCISION    . GASTRIC BYPASS      Allergies Fentanyl; Foeniculum vulgare; Morphine and related; Vicodin [hydrocodone-acetaminophen]; and Buprenorphine hcl  Social History Social History  Substance Use Topics  . Smoking status: Current Some Day Smoker    Types: Cigarettes, Cigars, E-cigarettes    Start date: 03/26/2005  . Smokeless tobacco: Never Used  . Alcohol use No     Comment: social    Review of Systems Constitutional: Negative for fever. Cardiovascular: Negative for chest pain. Respiratory: Negative for shortness of breath. Gastrointestinal: Negative for abdominal pain, vomiting and diarrhea. Genitourinary: Negative for dysuria. Musculoskeletal: Negative for back pain. Skin: Negative for rash. Neurological: Positive for headache  10-point ROS otherwise negative.  ____________________________________________   PHYSICAL EXAM:  VITAL SIGNS: ED Triage Vitals [04/07/16 1207]  Enc Vitals Group     BP 120/80     Pulse Rate 73     Resp 16     Temp 98.2 F (36.8 C)     Temp Source Oral     SpO2 100 %     Weight 140 lb (  63.5 kg)     Height 6' (1.829 m)     Head Circumference      Peak Flow      Pain Score 10     Pain Loc      Pain Edu?      Excl. in [place]?     Constitutional: Alert and oriented. Well appearing and in no distress. Eyes: Conjunctivae are normal. PERRL. Normal extraocular movements. ENT   Head: Normocephalic and  atraumatic.   Nose: No congestion/rhinnorhea.   Mouth/Throat: Mucous membranes are moist.   Neck: No stridor. Cardiovascular: Normal rate, regular rhythm. No murmurs, rubs, or gallops. Respiratory: Normal respiratory effort without tachypnea nor retractions. Breath sounds are clear and equal bilaterally. No wheezes/rales/rhonchi. Gastrointestinal: Soft and nontender. Normal bowel sounds Musculoskeletal: Nontender with normal range of motion in all extremities. No lower extremity tenderness nor edema. Neurologic:  Normal speech and language. No gross focal neurologic deficits are appreciated.  Skin:  Skin is warm, dry and intact. No rash noted. Psychiatric: Mood and affect are normal. Speech and behavior are normal.  ____________________________________________  ED COURSE:  Pertinent labs & imaging results that were available during my care of the patient were reviewed by me and considered in my medical decision making (see chart for details). Clinical Course  Patient's no acute distress, we will provide IV headache cocktail and reevaluate.  Procedures ____________________________________________   LABS (pertinent positives/negatives)  Labs Reviewed  CBC WITH DIFFERENTIAL/PLATELET - Abnormal; Notable for the following:       Result Value   Hemoglobin 9.9 (*)    HCT 30.7 (*)    MCV 73.8 (*)    MCH 23.8 (*)    RDW 16.2 (*)    All other components within normal limits  COMPREHENSIVE METABOLIC PANEL - Abnormal; Notable for the following:    Potassium 3.1 (*)    Chloride 118 (*)    CO2 18 (*)    Calcium 8.5 (*)    Total Protein 6.3 (*)    All other components within normal limits  POC URINE PREG, ED    ____________________________________________  FINAL ASSESSMENT AND PLAN  Headache, Chronic anemia  Plan: Patient with labs as dictated above. Patient has been advised she is slightly more anemic than normal. She will have close outpatient follow-up with hematology.  We'll prescribe Fioricet for headaches, she is stable for outpatient follow-up.   Earleen Newport, MD   Note: This dictation was prepared with Dragon dictation. Any transcriptional errors that result from this process are unintentional    Earleen Newport, MD 04/07/16 1729

## 2016-04-07 NOTE — ED Triage Notes (Signed)
Pt comes into the ED via POV c/o migraine that has been going on since last  Tuesday.  Patient explains that the pain is all over.  PCP prescribed medication to help and it has relieved the "pounding" sensation but still has not taken all the pain away.  Patient has been avoiding NSAID due to h/o of ulcers.  Pain is being unrelieved by all methods at home.  Patient has photosensitivity and sensitive to noise.  Patient in NAD at this time with even and unlabored respirations and ambulatory to triage.  Denies chest pain, shortness of breath, or vision changes.

## 2016-04-17 DIAGNOSIS — E876 Hypokalemia: Secondary | ICD-10-CM | POA: Diagnosis not present

## 2016-04-17 DIAGNOSIS — D509 Iron deficiency anemia, unspecified: Secondary | ICD-10-CM | POA: Diagnosis not present

## 2016-04-17 DIAGNOSIS — G43011 Migraine without aura, intractable, with status migrainosus: Secondary | ICD-10-CM | POA: Diagnosis not present

## 2016-04-17 DIAGNOSIS — J4530 Mild persistent asthma, uncomplicated: Secondary | ICD-10-CM | POA: Diagnosis not present

## 2016-04-23 IMAGING — CT CT CHEST W/ CM
2 of 3 series · 15 of 36 positions shown, 18 images · IV contrast (omnipaque)
Comparison: 03/27/2015, 02/23/2014 and 10/13/2013

CLINICAL DATA: Unexplained wheezing and hoarseness with chest
tightness. History of left adrenal adenoma and small left lung
nodule.

EXAM:
CT CHEST WITH CONTRAST
TECHNIQUE: Multidetector CT imaging of the chest was performed during
intravenous contrast administration.
CONTRAST:  75mL OMNIPAQUE IOHEXOL 350 MG/ML SOLN

[Series 2: routine chest with · axial · 0.64mm/px · z∈[-567,-332]mm · 12 of 57 slices shown, 15 images]
[im 5/57  mediastinal]
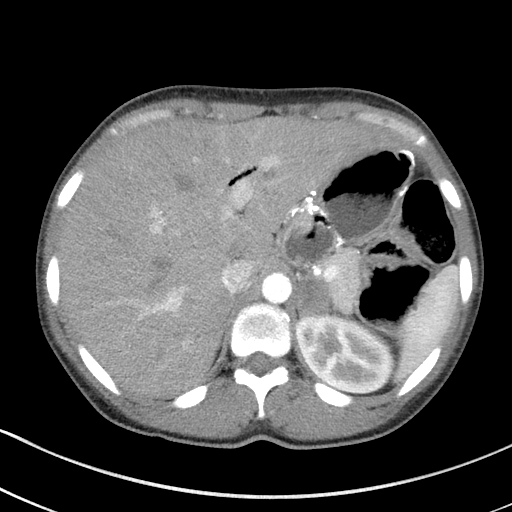
[im 5/57  lung]
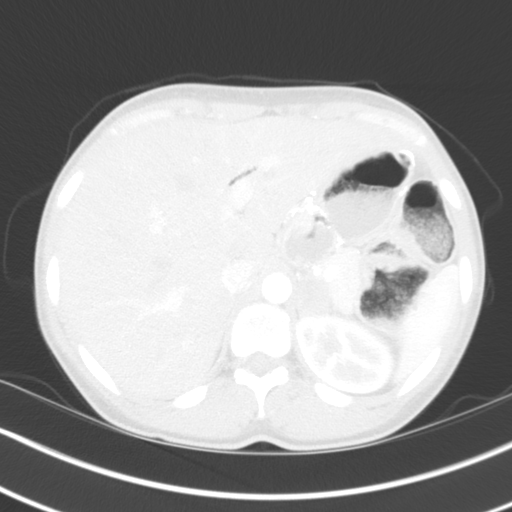
[im 9/57  lung]
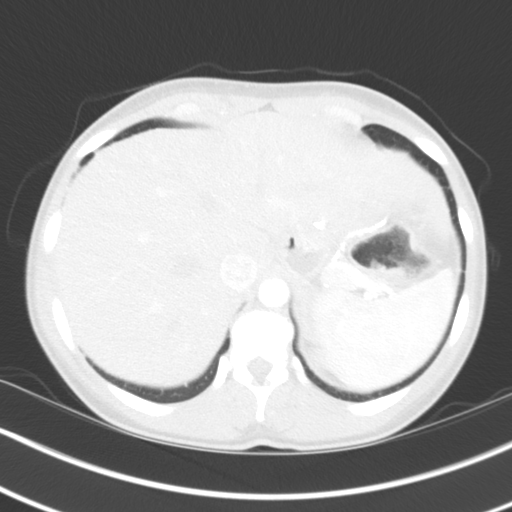
[im 13/57  lung]
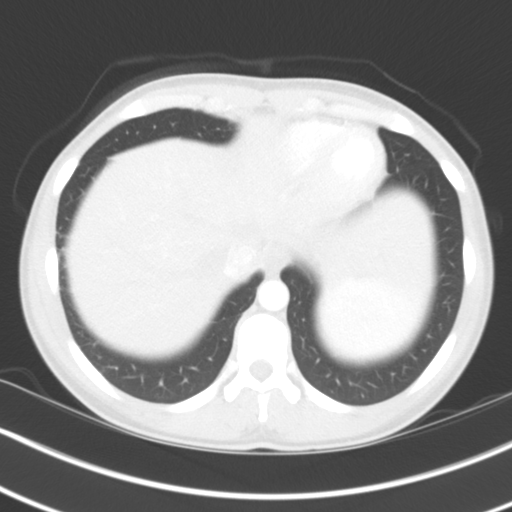
[im 17/57  lung]
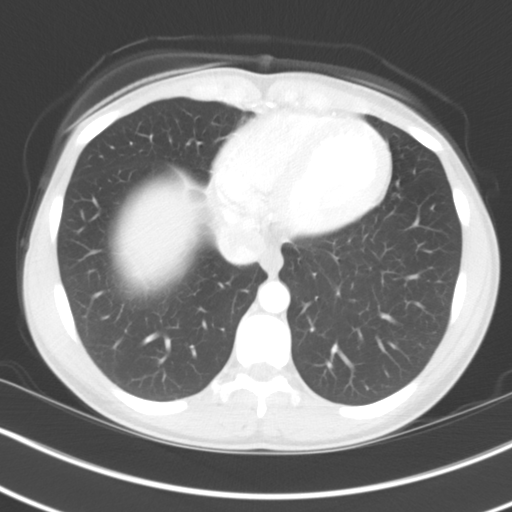
[im 21/57  mediastinal]
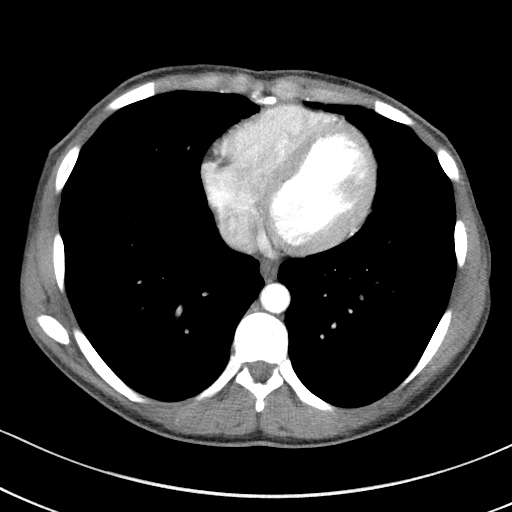
[im 21/57  lung]
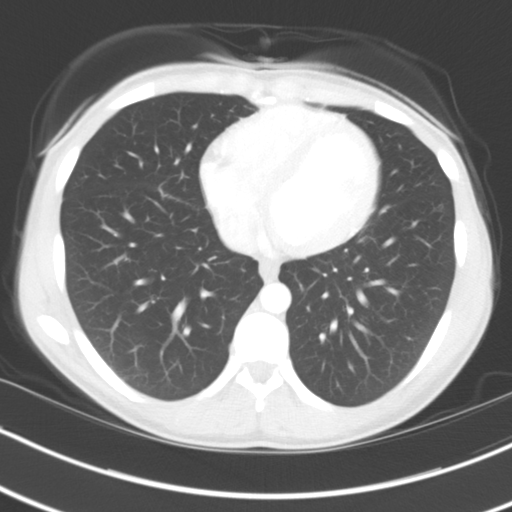
[im 25/57  lung]
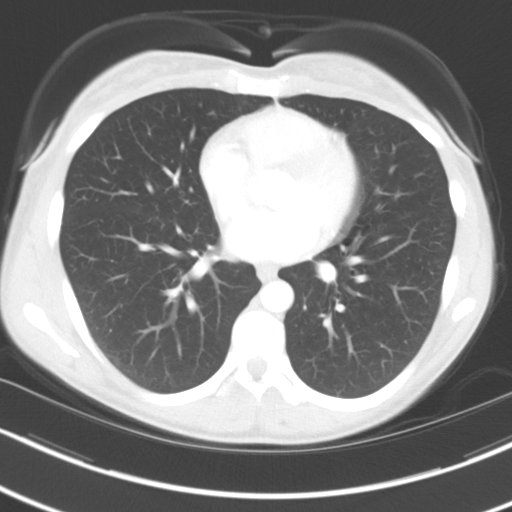
[im 32/57  lung]
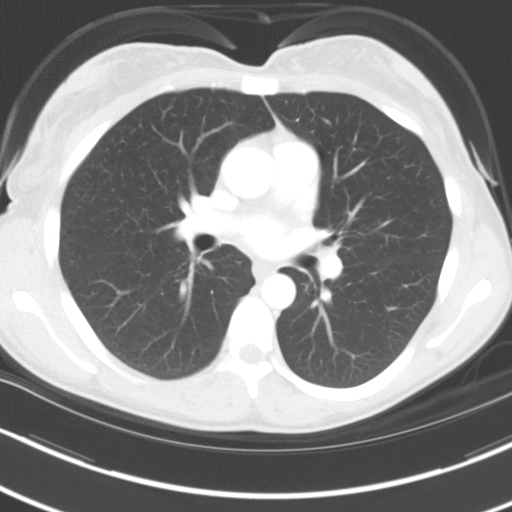
[im 36/57  lung]
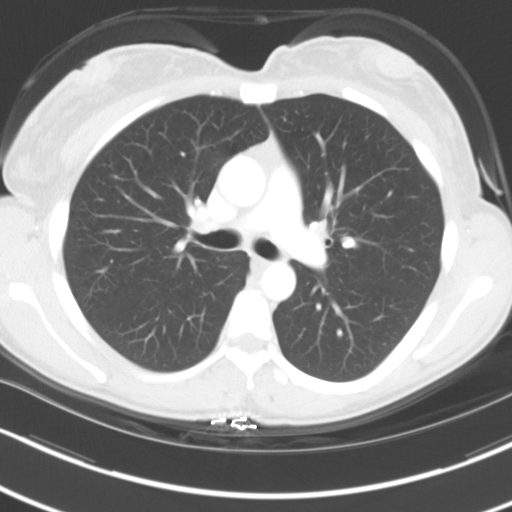
[im 40/57  mediastinal]
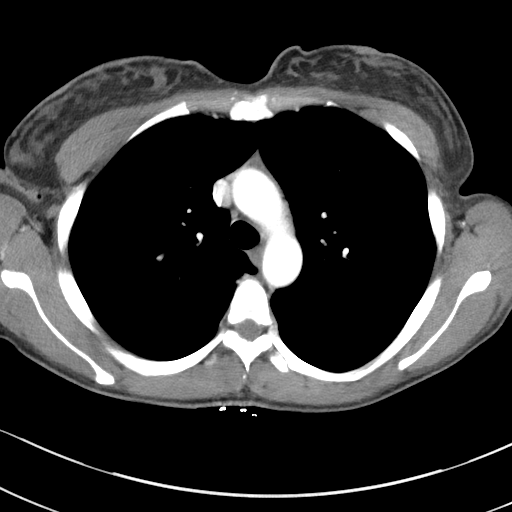
[im 40/57  lung]
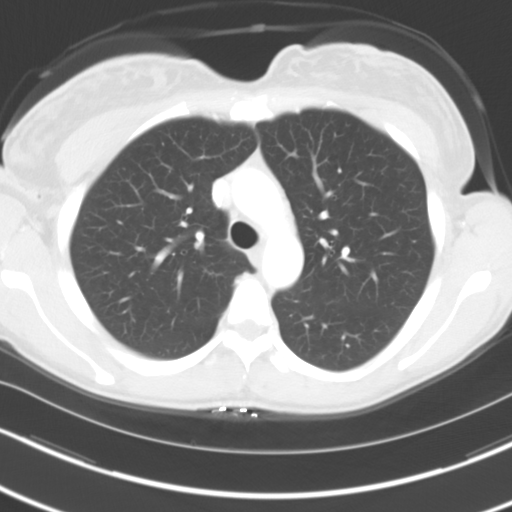
[im 44/57  lung]
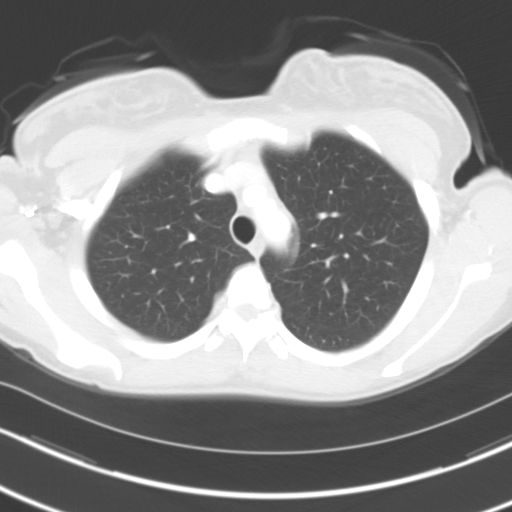
[im 48/57  lung]
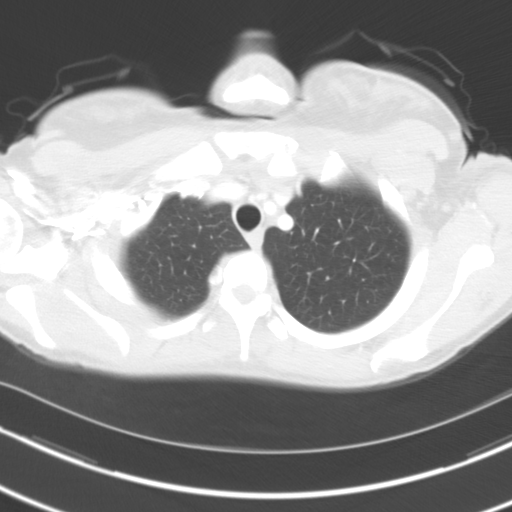
[im 52/57  lung]
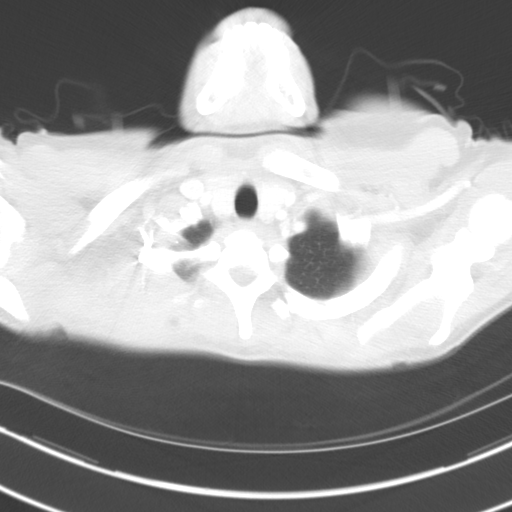

[Series 5: cor routine chest with · coronal · 0.57mm/px · 3 of 138 slices shown]
[im 28/138  lung]
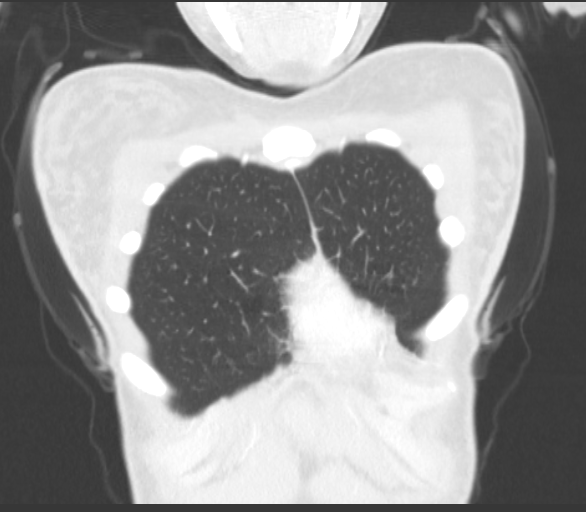
[im 55/138  lung]
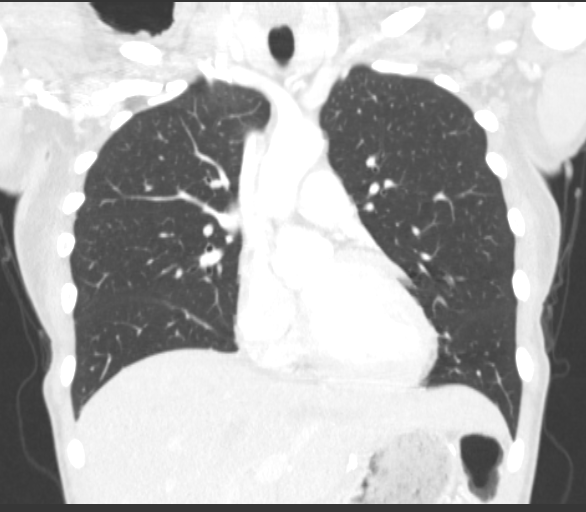
[im 83/138  lung]
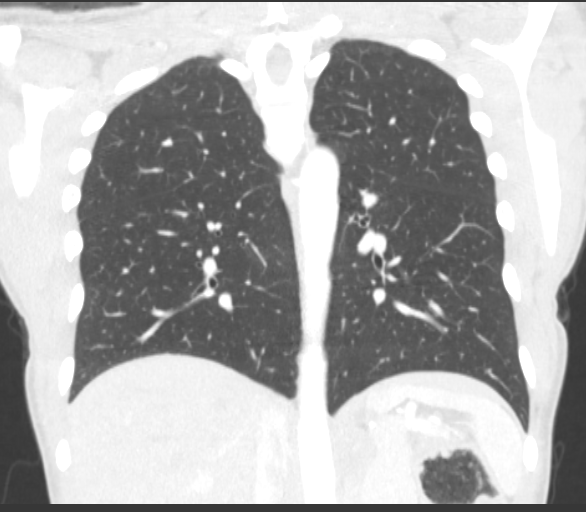

[15 of 36 positions shown; findings below may reference images not displayed]

FINDINGS: Negative for chest lymphadenopathy. No significant pericardial or
pleural fluid. Again noted is small amount of pneumobilia.
Postsurgical changes in the stomach suggestive for a bypass
procedure. Again noted is a left adrenal lesion which has been
previously described as an adenoma on MRI and measures roughly
cm. No acute abnormality in the upper abdomen.

The trachea and mainstem bronchi are patent. Again noted is a subtle
4 mm nodule in the left lower lobe on sequence 3, image 36. This
nodule may have been present on the examination from 8962 but that
examination was limited due to breathing artifact in this area. No
other pulmonary nodules. The lungs are clear bilaterally. No acute
bone abnormality.
IMPRESSION: No acute chest abnormality.

Small amount of pneumobilia. This could be related to prior biliary
intervention, such as a sphincterotomy. Recommend clinical
correlation.

4 mm nodule in the left lower lobe. Questionable stability since
8962 as described. If the patient is at high risk for bronchogenic
carcinoma, follow-up chest CT at 1 year is recommended. If the
patient is at low risk, no follow-up is needed. This recommendation
follows the consensus statement: Guidelines for Management of Small
Pulmonary Nodules Detected on CT Scans: A Statement from the

## 2016-04-25 DIAGNOSIS — R51 Headache: Secondary | ICD-10-CM | POA: Diagnosis not present

## 2016-04-25 DIAGNOSIS — G8929 Other chronic pain: Secondary | ICD-10-CM | POA: Diagnosis not present

## 2016-04-25 DIAGNOSIS — K861 Other chronic pancreatitis: Secondary | ICD-10-CM | POA: Diagnosis not present

## 2016-04-25 DIAGNOSIS — R109 Unspecified abdominal pain: Secondary | ICD-10-CM | POA: Diagnosis not present

## 2016-04-28 ENCOUNTER — Ambulatory Visit: Payer: Medicare Other | Admitting: Licensed Clinical Social Worker

## 2016-05-04 NOTE — Progress Notes (Signed)
[place]  Telephone:(336) [phone] Fax:(336) [phone]  ID: Stacy Pineda OB: 12-30-1980  MR#: [id number]  [id number]  Patient Care Team: Ronnell Freshwater, NP as PCP - General (Family Medicine)  CHIEF COMPLAINT:  Iron deficiency anemia.  INTERVAL HISTORY: Patient returns to clinic today for repeat laboratory work and further evaluation. She currently feels well and is asymptomatic. She does not complain of weakness or fatigue today. She has no neurologic complaints. She has a good appetite and denies weight loss. She denies any fevers. She denies any chest pain or shortness of breath. She denies any nausea, vomiting, constipation, or diarrhea. She has no urinary complaints. She previously had heavy menses, but these are now well controlled. Patient offers no specific complaints today.  REVIEW OF SYSTEMS:   Review of Systems  Constitutional: Negative for fever, malaise/fatigue and weight loss.  Respiratory: Negative.  Negative for cough and shortness of breath.   Cardiovascular: Negative.  Negative for chest pain and leg swelling.  Gastrointestinal: Negative for abdominal pain, blood in stool and melena.  Musculoskeletal: Negative.   Neurological: Negative.  Negative for weakness.  Psychiatric/Behavioral: Negative.  The patient is not nervous/anxious.     As per HPI. Otherwise, a complete review of systems is negative.  PAST MEDICAL HISTORY: Past Medical History:  Diagnosis Date  . Anxiety   . Asthma   . Chronic headaches   . Depression   . Pancreatitis   . PTSD (post-traumatic stress disorder)     PAST SURGICAL HISTORY: Past Surgical History:  Procedure Laterality Date  . ABDOMINAL SURGERY    . CHOLECYSTECTOMY    . DECUBITUS ULCER EXCISION    . GASTRIC BYPASS      FAMILY HISTORY Family History  Problem Relation Age of Onset  . Graves' disease Mother   . Hypertension Mother   . Hyperlipidemia Mother   . Anxiety disorder Father   . Prostate  cancer Father   . Depression Brother   . Anxiety disorder Brother   . Anxiety disorder Maternal Aunt   . Depression Maternal Aunt   . Anxiety disorder Paternal Aunt   . Depression Paternal Aunt   . Anxiety disorder Maternal Uncle   . Depression Maternal Uncle   . Anxiety disorder Paternal Uncle   . Depression Paternal Uncle   . Anxiety disorder Maternal Grandfather   . Depression Maternal Grandfather   . Anxiety disorder Maternal Grandmother   . Depression Maternal Grandmother        ADVANCED DIRECTIVES:    HEALTH MAINTENANCE: Social History  Substance Use Topics  . Smoking status: Current Some Day Smoker    Types: Cigarettes, Cigars, E-cigarettes    Start date: 03/26/2005  . Smokeless tobacco: Never Used  . Alcohol use No     Comment: social     Allergies  Allergen Reactions  . Fentanyl Itching  . Foeniculum Vulgare   . Morphine And Related Itching    Itchy.   . Vicodin [Hydrocodone-Acetaminophen] Itching  . Buprenorphine Hcl Itching    Itchy.     Current Outpatient Prescriptions  Medication Sig Dispense Refill  . albuterol (VENTOLIN HFA) 108 (90 Base) MCG/ACT inhaler     . ASMANEX HFA 100 MCG/ACT AERO     . butalbital-acetaminophen-caffeine (FIORICET) 50-325-40 MG tablet Take 1-2 tablets by mouth every 6 (six) hours as needed for headache. 20 tablet 0  . CREON 36000 UNITS CPEP capsule     . escitalopram (LEXAPRO) 20 MG tablet Take 1 tablet (20  mg total) by mouth daily. 1/2 pill twice daily 30 tablet 1  . hydrOXYzine (ATARAX/VISTARIL) 10 MG tablet Take 1 tablet (10 mg total) by mouth 2 (two) times daily as needed. 60 tablet 1  . medroxyPROGESTERone (DEPO-PROVERA) 150 MG/ML injection Inject 1 mL (150 mg total) into the muscle every 3 (three) months. 1 mL 3  . metroNIDAZOLE (METROGEL) 0.75 % vaginal gel Place 1 Applicatorful vaginally at bedtime. Apply one applicatorful to vagina at bedtime for 5 days 70 g 1  . norgestimate-ethinyl estradiol  (ORTHO-CYCLEN,SPRINTEC,PREVIFEM) 0.25-35 MG-MCG tablet Take 1 tablet by mouth daily. 1 Package 11  . ondansetron (ZOFRAN) 4 MG tablet Take 4 mg by mouth.    . pregabalin (LYRICA) 200 MG capsule Take 200 mg by mouth 3 (three) times daily.     . promethazine (PHENERGAN) 25 MG tablet Take 25 mg by mouth.    . topiramate (TOPAMAX) 100 MG tablet Take 1 tablet (100 mg total) by mouth daily. (Patient taking differently: Take 100 mg by mouth 2 (two) times daily. ) 30 tablet 1   No current facility-administered medications for this visit.     OBJECTIVE: Vitals:   05/05/16 1418  BP: 116/72  Pulse: 80  Resp: 18  Temp: 98.5 F (36.9 C)     Body mass index is 19.38 kg/m.    ECOG FS:0 - Asymptomatic  General: Well-developed, well-nourished, no acute distress. Eyes: Pink conjunctiva, anicteric sclera. Lungs: Clear to auscultation bilaterally. Heart: Regular rate and rhythm. No rubs, murmurs, or gallops. Abdomen: Soft, nontender, nondistended. No organomegaly noted, normoactive bowel sounds. Musculoskeletal: No edema, cyanosis, or clubbing. Neuro: Alert, answering all questions appropriately. Cranial nerves grossly intact. Skin: No rashes or petechiae noted. Psych: Normal affect.   LAB RESULTS:  Lab Results  Component Value Date   NA 142 04/07/2016   K 3.1 (L) 04/07/2016   CL 118 (H) 04/07/2016   CO2 18 (L) 04/07/2016   GLUCOSE 86 04/07/2016   BUN 13 04/07/2016   CREATININE 0.46 04/07/2016   CALCIUM 8.5 (L) 04/07/2016   PROT 6.3 (L) 04/07/2016   ALBUMIN 3.6 04/07/2016   AST 16 04/07/2016   ALT 14 04/07/2016   ALKPHOS 79 04/07/2016   BILITOT 0.4 04/07/2016   GFRNONAA >60 04/07/2016   GFRAA >60 04/07/2016    Lab Results  Component Value Date   WBC 8.3 05/05/2016   NEUTROABS 4.0 05/05/2016   HGB 10.8 (L) 05/05/2016   HCT 33.8 (L) 05/05/2016   MCV 74.3 (L) 05/05/2016   PLT 248 05/05/2016   Lab Results  Component Value Date   IRON 62 05/05/2016   TIBC 285 05/05/2016    IRONPCTSAT 22 05/05/2016    Lab Results  Component Value Date   FERRITIN 40 05/05/2016     STUDIES: No results found.  ASSESSMENT: Iron deficiency anemia.  PLAN:    1. Iron deficiency anemia: Patient's hemoglobin is essentially unchanged at 10.8, but her iron stores have significantly improved and she is asymptomatic. Previously, she had a mildly decreased folate and B-12, but the remainder of her laboratory work is within normal limits. She does not require additional Feraheme today. Patient last received IV Feraheme in June 2017. Return to clinic in 3 months with repeat laboratory work and further evaluation.  2. Heavy menses: Improving. Now that patient's menses are better controlled, she may require less iron infusions. Continue treatment and monitoring per gynecology.   Patient expressed understanding and was in agreement with this plan. She also understands that  She can call clinic at any time with any questions, concerns, or complaints.   Lloyd Huger, MD   05/07/2016 9:51 PM

## 2016-05-05 ENCOUNTER — Inpatient Hospital Stay: Payer: Medicare Other

## 2016-05-05 ENCOUNTER — Inpatient Hospital Stay: Payer: Medicare Other | Attending: Oncology | Admitting: Oncology

## 2016-05-05 VITALS — BP 116/72 | HR 80 | Temp 98.5°F | Resp 18 | Wt 142.9 lb

## 2016-05-05 DIAGNOSIS — D509 Iron deficiency anemia, unspecified: Secondary | ICD-10-CM

## 2016-05-05 DIAGNOSIS — Z79899 Other long term (current) drug therapy: Secondary | ICD-10-CM | POA: Diagnosis not present

## 2016-05-05 DIAGNOSIS — F17210 Nicotine dependence, cigarettes, uncomplicated: Secondary | ICD-10-CM | POA: Diagnosis not present

## 2016-05-05 DIAGNOSIS — N920 Excessive and frequent menstruation with regular cycle: Secondary | ICD-10-CM | POA: Diagnosis not present

## 2016-05-05 LAB — IRON AND TIBC
IRON: 62 ug/dL (ref 28–170)
SATURATION RATIOS: 22 % (ref 10.4–31.8)
TIBC: 285 ug/dL (ref 250–450)
UIBC: 223 ug/dL

## 2016-05-05 LAB — CBC WITH DIFFERENTIAL/PLATELET
BASOS ABS: 0.2 10*3/uL — AB (ref 0–0.1)
Basophils Relative: 2 %
EOS ABS: 0.3 10*3/uL (ref 0–0.7)
Eosinophils Relative: 3 %
HEMATOCRIT: 33.8 % — AB (ref 35.0–47.0)
HEMOGLOBIN: 10.8 g/dL — AB (ref 12.0–16.0)
LYMPHS PCT: 42 %
Lymphs Abs: 3.5 10*3/uL (ref 1.0–3.6)
MCH: 23.7 pg — ABNORMAL LOW (ref 26.0–34.0)
MCHC: 31.9 g/dL — AB (ref 32.0–36.0)
MCV: 74.3 fL — ABNORMAL LOW (ref 80.0–100.0)
MONOS PCT: 4 %
Monocytes Absolute: 0.3 10*3/uL (ref 0.2–0.9)
NEUTROS PCT: 49 %
NRBC: 3 /100{WBCs} — AB
Neutro Abs: 4 10*3/uL (ref 1.4–6.5)
Platelets: 248 10*3/uL (ref 150–440)
RBC: 4.55 MIL/uL (ref 3.80–5.20)
RDW: 17.3 % — ABNORMAL HIGH (ref 11.5–14.5)
WBC: 8.3 10*3/uL (ref 3.6–11.0)

## 2016-05-05 LAB — FERRITIN: Ferritin: 40 ng/mL (ref 11–307)

## 2016-05-05 NOTE — Progress Notes (Signed)
States is feeling well. Offers no complaints. 

## 2016-05-18 ENCOUNTER — Other Ambulatory Visit: Payer: Self-pay | Admitting: Psychiatry

## 2016-05-22 ENCOUNTER — Other Ambulatory Visit: Payer: Self-pay | Admitting: Psychiatry

## 2016-05-22 ENCOUNTER — Telehealth: Payer: Self-pay

## 2016-05-22 NOTE — Telephone Encounter (Signed)
pt states that she was told that dr. Gretel Acre was leaving so she has an appt for nov 3 with new person.  pt just needs enough medication called in until she is seen.

## 2016-05-22 NOTE — Telephone Encounter (Signed)
left message that it was ok to refill lexapro, hydroxyzine, topamax with no additional refills.

## 2016-05-27 ENCOUNTER — Other Ambulatory Visit: Payer: Self-pay | Admitting: Obstetrics and Gynecology

## 2016-05-28 ENCOUNTER — Ambulatory Visit: Payer: Self-pay

## 2016-05-30 ENCOUNTER — Ambulatory Visit (INDEPENDENT_AMBULATORY_CARE_PROVIDER_SITE_OTHER): Payer: Medicare Other | Admitting: Obstetrics and Gynecology

## 2016-05-30 VITALS — BP 95/58 | HR 91 | Ht 72.0 in | Wt 145.2 lb

## 2016-05-30 DIAGNOSIS — Z3042 Encounter for surveillance of injectable contraceptive: Secondary | ICD-10-CM | POA: Diagnosis not present

## 2016-05-30 MED ORDER — MEDROXYPROGESTERONE ACETATE 150 MG/ML IM SUSP
150.0000 mg | Freq: Once | INTRAMUSCULAR | Status: AC
  Administered 2016-05-30: 150 mg via INTRAMUSCULAR

## 2016-05-30 NOTE — Patient Instructions (Signed)

## 2016-05-30 NOTE — Progress Notes (Signed)
Date last pap: N/A Last Depo-Provera: 03/12/2016 Side Effects if any: None Serum HCG indicated? No pt within allotted window Depo-Provera 150 mg IM given by: Corliss Blacker, CMA Pt tolerated well Next appointment due: 08/05/16-08/19/16

## 2016-06-04 ENCOUNTER — Ambulatory Visit: Payer: Medicare Other | Admitting: Licensed Clinical Social Worker

## 2016-06-09 ENCOUNTER — Ambulatory Visit (HOSPITAL_COMMUNITY): Payer: Medicare Other | Admitting: Psychiatry
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
## 2016-06-23 ENCOUNTER — Encounter (HOSPITAL_COMMUNITY): Payer: Self-pay | Admitting: Psychiatry

## 2016-06-23 ENCOUNTER — Ambulatory Visit (INDEPENDENT_AMBULATORY_CARE_PROVIDER_SITE_OTHER): Payer: Medicare Other | Admitting: Psychiatry

## 2016-06-23 VITALS — BP 110/76 | HR 84 | Ht 72.0 in | Wt 146.4 lb

## 2016-06-23 DIAGNOSIS — Z8249 Family history of ischemic heart disease and other diseases of the circulatory system: Secondary | ICD-10-CM | POA: Diagnosis not present

## 2016-06-23 DIAGNOSIS — F4310 Post-traumatic stress disorder, unspecified: Secondary | ICD-10-CM

## 2016-06-23 DIAGNOSIS — F411 Generalized anxiety disorder: Secondary | ICD-10-CM

## 2016-06-23 DIAGNOSIS — Z8042 Family history of malignant neoplasm of prostate: Secondary | ICD-10-CM

## 2016-06-23 DIAGNOSIS — Z79899 Other long term (current) drug therapy: Secondary | ICD-10-CM

## 2016-06-23 DIAGNOSIS — F331 Major depressive disorder, recurrent, moderate: Secondary | ICD-10-CM | POA: Insufficient documentation

## 2016-06-23 DIAGNOSIS — F17210 Nicotine dependence, cigarettes, uncomplicated: Secondary | ICD-10-CM

## 2016-06-23 DIAGNOSIS — Z818 Family history of other mental and behavioral disorders: Secondary | ICD-10-CM

## 2016-06-23 MED ORDER — MIRTAZAPINE 7.5 MG PO TABS: ORAL_TABLET | ORAL | 0 refills | Status: DC

## 2016-06-23 MED ORDER — ESCITALOPRAM OXALATE 20 MG PO TABS: 20.0000 mg | ORAL_TABLET | Freq: Every day | ORAL | 0 refills | Status: DC

## 2016-06-23 MED ORDER — MIRTAZAPINE 15 MG PO TBDP: ORAL_TABLET | ORAL | 0 refills | Status: DC

## 2016-06-23 NOTE — Patient Instructions (Signed)
1. Continue Lexapro 20 mg daily 2. Start Mirtazapine 7.5 mg at night for two weeks, then 15 mg at night 3. Return to clinic in one month 4. Continue to see your therapist 5. Try mindfulness exercise

## 2016-07-21 DIAGNOSIS — R51 Headache: Secondary | ICD-10-CM | POA: Diagnosis not present

## 2016-07-21 DIAGNOSIS — K861 Other chronic pancreatitis: Secondary | ICD-10-CM | POA: Diagnosis not present

## 2016-07-21 DIAGNOSIS — G8929 Other chronic pain: Secondary | ICD-10-CM | POA: Diagnosis not present

## 2016-07-21 DIAGNOSIS — R109 Unspecified abdominal pain: Secondary | ICD-10-CM | POA: Diagnosis not present

## 2016-07-24 ENCOUNTER — Other Ambulatory Visit (HOSPITAL_COMMUNITY): Payer: Self-pay

## 2016-07-24 MED ORDER — ESCITALOPRAM OXALATE 20 MG PO TABS: 20.0000 mg | ORAL_TABLET | Freq: Every day | ORAL | 0 refills | Status: DC
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
## 2016-07-28 ENCOUNTER — Ambulatory Visit (INDEPENDENT_AMBULATORY_CARE_PROVIDER_SITE_OTHER): Payer: Medicare Other | Admitting: Psychiatry

## 2016-07-28 ENCOUNTER — Encounter (HOSPITAL_COMMUNITY): Payer: Self-pay | Admitting: Psychiatry

## 2016-07-28 VITALS — BP 118/68 | HR 78 | Ht 72.0 in | Wt 157.6 lb

## 2016-07-28 DIAGNOSIS — Z8249 Family history of ischemic heart disease and other diseases of the circulatory system: Secondary | ICD-10-CM

## 2016-07-28 DIAGNOSIS — F331 Major depressive disorder, recurrent, moderate: Secondary | ICD-10-CM

## 2016-07-28 DIAGNOSIS — F411 Generalized anxiety disorder: Secondary | ICD-10-CM

## 2016-07-28 DIAGNOSIS — Z888 Allergy status to other drugs, medicaments and biological substances status: Secondary | ICD-10-CM

## 2016-07-28 DIAGNOSIS — Z8042 Family history of malignant neoplasm of prostate: Secondary | ICD-10-CM

## 2016-07-28 DIAGNOSIS — F4310 Post-traumatic stress disorder, unspecified: Secondary | ICD-10-CM

## 2016-07-28 DIAGNOSIS — F17210 Nicotine dependence, cigarettes, uncomplicated: Secondary | ICD-10-CM

## 2016-07-28 DIAGNOSIS — Z79899 Other long term (current) drug therapy: Secondary | ICD-10-CM

## 2016-07-28 DIAGNOSIS — Z8489 Family history of other specified conditions: Secondary | ICD-10-CM

## 2016-07-28 DIAGNOSIS — Z818 Family history of other mental and behavioral disorders: Secondary | ICD-10-CM

## 2016-07-28 MED ORDER — ESCITALOPRAM OXALATE 20 MG PO TABS: 20.0000 mg | ORAL_TABLET | Freq: Every day | ORAL | 1 refills | Status: DC

## 2016-07-28 MED ORDER — MIRTAZAPINE 30 MG PO TABS: 30.0000 mg | ORAL_TABLET | Freq: Every day | ORAL | 1 refills | Status: DC

## 2016-07-28 NOTE — Patient Instructions (Signed)
1. Continue Lexapro 20 mg daily 2. Increase mirtazapine 30 mg at night 3. Return to clinic in February

## 2016-08-06 NOTE — Progress Notes (Signed)
[name]  Telephone:(336) [phone] [phone]) [phone]  ID: Stacy Pineda OB: 1981/03/31  MR#: [id number]  [id number]  Patient Care Team: Ronnell Freshwater, NP as PCP - General (Family Medicine)  CHIEF COMPLAINT:  Iron deficiency anemia.  INTERVAL HISTORY: Patient returns to clinic today for repeat laboratory work and further evaluation. She complains of mild fatigue but states she is a full time student and she stays tired. She also states that she is no longer having heavy menstrual cycles since she began depo provera. She also has a family history of thalassemia and would like to be screened for this. She does not complain of weakness today. She has no neurologic complaints. She has a good appetite and denies weight loss. She denies any fevers. She denies any chest pain or shortness of breath. She denies any nausea, vomiting, constipation, or diarrhea. She has no urinary complaints. She previously had heavy menses, but these are now well controlled. Patient offers no specific complaints today.  REVIEW OF SYSTEMS:   Review of Systems  Constitutional: Negative for fever, malaise/fatigue and weight loss.  Respiratory: Negative.  Negative for cough and shortness of breath.   Cardiovascular: Negative.  Negative for chest pain and leg swelling.  Gastrointestinal: Negative for abdominal pain, blood in stool and melena.  Musculoskeletal: Negative.   Neurological: Negative.  Negative for weakness.  Psychiatric/Behavioral: Negative.  The patient is not nervous/anxious.     As per HPI. Otherwise, a complete review of systems is negative.  PAST MEDICAL HISTORY: Past Medical History:  Diagnosis Date  . Anxiety   . Asthma   . Chronic headaches   . Depression   . Pancreatitis   . PTSD (post-traumatic stress disorder)     PAST SURGICAL HISTORY: Past Surgical History:  Procedure Laterality Date  . ABDOMINAL SURGERY    . CHOLECYSTECTOMY    . DECUBITUS ULCER EXCISION     . GASTRIC BYPASS      FAMILY HISTORY Family History  Problem Relation Age of Onset  . Graves' disease Mother   . Hypertension Mother   . Hyperlipidemia Mother   . Anxiety disorder Father   . Prostate cancer Father   . Depression Brother   . Anxiety disorder Brother   . Anxiety disorder Maternal Aunt   . Depression Maternal Aunt   . Anxiety disorder Paternal Aunt   . Depression Paternal Aunt   . Anxiety disorder Maternal Uncle   . Depression Maternal Uncle   . Anxiety disorder Paternal Uncle   . Depression Paternal Uncle   . Anxiety disorder Maternal Grandfather   . Depression Maternal Grandfather   . Anxiety disorder Maternal Grandmother   . Depression Maternal Grandmother        ADVANCED DIRECTIVES:    HEALTH MAINTENANCE: Social History  Substance Use Topics  . Smoking status: Current Some Day Smoker    Types: Cigarettes, Cigars, E-cigarettes    Start date: 03/26/2005  . Smokeless tobacco: Never Used  . Alcohol use No     Comment: social     Allergies  Allergen Reactions  . Fentanyl Itching  . Morphine And Related Itching    Itchy.   . Vicodin [Hydrocodone-Acetaminophen] Itching  . Buprenorphine Hcl Itching    Itchy.     Current Outpatient Prescriptions  Medication Sig Dispense Refill  . albuterol (VENTOLIN HFA) 108 (90 Base) MCG/ACT inhaler     . ASMANEX HFA 100 MCG/ACT AERO     . butalbital-acetaminophen-caffeine (FIORICET) 50-325-40 MG tablet Take  1-2 tablets by mouth every 6 (six) hours as needed for headache. 20 tablet 0  . CREON 36000 UNITS CPEP capsule     . escitalopram (LEXAPRO) 20 MG tablet Take 1 tablet (20 mg total) by mouth daily. 30 tablet 1  . medroxyPROGESTERone (DEPO-PROVERA) 150 MG/ML injection INJECT 1 ML (150 MG TOTAL) INTO THE MUSCLE EVERY 3 MONTHS 1 mL 3  . metroNIDAZOLE (METROGEL) 0.75 % vaginal gel Place 1 Applicatorful vaginally at bedtime. Apply one applicatorful to vagina at bedtime for 5 days 70 g 1  . mirtazapine (REMERON)  30 MG tablet Take 1 tablet (30 mg total) by mouth at bedtime. 30 tablet 1  . ondansetron (ZOFRAN) 4 MG tablet Take 4 mg by mouth.    . pregabalin (LYRICA) 200 MG capsule Take 200 mg by mouth 3 (three) times daily.     . promethazine (PHENERGAN) 25 MG tablet Take 25 mg by mouth.    . topiramate (TOPAMAX) 100 MG tablet Take 1 tablet (100 mg total) by mouth daily. (Patient taking differently: Take 100 mg by mouth 2 (two) times daily. ) 30 tablet 1   No current facility-administered medications for this visit.     OBJECTIVE: Vitals:   08/07/16 1427  BP: 104/70  Pulse: 74  Resp: 18  Temp: 98.7 F (37.1 C)     Body mass index is 20.93 kg/m.    ECOG FS:0 - Asymptomatic  General: Well-developed, well-nourished, no acute distress. Eyes: Pink conjunctiva, anicteric sclera. Lungs: Clear to auscultation bilaterally. Heart: Regular rate and rhythm. No rubs, murmurs, or gallops. Abdomen: Soft, nontender, nondistended. No organomegaly noted, normoactive bowel sounds. Musculoskeletal: No edema, cyanosis, or clubbing. Neuro: Alert, answering all questions appropriately. Cranial nerves grossly intact. Skin: No rashes or petechiae noted. Psych: Normal affect.   LAB RESULTS:  Lab Results  Component Value Date   NA 142 04/07/2016   K 3.1 (L) 04/07/2016   CL 118 (H) 04/07/2016   CO2 18 (L) 04/07/2016   GLUCOSE 86 04/07/2016   BUN 13 04/07/2016   CREATININE 0.46 04/07/2016   CALCIUM 8.5 (L) 04/07/2016   PROT 6.3 (L) 04/07/2016   ALBUMIN 3.6 04/07/2016   AST 16 04/07/2016   ALT 14 04/07/2016   ALKPHOS 79 04/07/2016   BILITOT 0.4 04/07/2016   GFRNONAA >60 04/07/2016   GFRAA >60 04/07/2016    Lab Results  Component Value Date   WBC 9.4 08/07/2016   NEUTROABS 4.7 08/07/2016   HGB 10.7 (L) 08/07/2016   HCT 34.1 (L) 08/07/2016   MCV 75.0 (L) 08/07/2016   PLT 211 08/07/2016   Lab Results  Component Value Date   IRON 27 (L) 08/07/2016   TIBC 352 08/07/2016   IRONPCTSAT 8 (L)  08/07/2016    Lab Results  Component Value Date   FERRITIN 24 08/07/2016     STUDIES: No results found.  ASSESSMENT: Iron deficiency anemia.  PLAN:    1. Iron deficiency anemia: Patient's hemoglobin is essentially unchanged at 10.7, and ferritin is 24.  Previously, she had a mildly decreased folate and B-12, but the remainder of her laboratory work is within normal limits. She does not require additional Feraheme today. Patient last received IV Feraheme in June 2017. Return to clinic in 3 months with repeat laboratory work and further evaluation.  2. Heavy menses: Improving. She began Depro Provera a few months ago and it seems to make her menses lighter. Now that patient's menses are better controlled, she may require less iron infusions.  Continue treatment and monitoring per gynecology.  3. Thalassemia: Hemoglobin electrophoresis ordered.   Patient expressed understanding and was in agreement with this plan. She also understands that She can call clinic at any time with any questions, concerns, or complaints.   Faythe Casa, NP 08/07/2016  Patient was seen and evaluated independently and I agree with the assessment and plan as dictated above. Although patient's hemoglobin is unchanged, her iron stores have trended down and she likely will require IV Feraheme at her next visit.  Lloyd Huger, MD 08/08/16 4:09 PM

## 2016-08-07 ENCOUNTER — Inpatient Hospital Stay: Payer: Medicare Other

## 2016-08-07 ENCOUNTER — Inpatient Hospital Stay (HOSPITAL_BASED_OUTPATIENT_CLINIC_OR_DEPARTMENT_OTHER): Payer: Medicare Other | Admitting: Oncology

## 2016-08-07 ENCOUNTER — Inpatient Hospital Stay: Payer: Medicare Other | Attending: Oncology

## 2016-08-07 VITALS — BP 104/70 | HR 74 | Temp 98.7°F | Resp 18 | Wt 154.3 lb

## 2016-08-07 DIAGNOSIS — Z9884 Bariatric surgery status: Secondary | ICD-10-CM | POA: Diagnosis not present

## 2016-08-07 DIAGNOSIS — Z79899 Other long term (current) drug therapy: Secondary | ICD-10-CM | POA: Insufficient documentation

## 2016-08-07 DIAGNOSIS — Z793 Long term (current) use of hormonal contraceptives: Secondary | ICD-10-CM | POA: Insufficient documentation

## 2016-08-07 DIAGNOSIS — D509 Iron deficiency anemia, unspecified: Secondary | ICD-10-CM

## 2016-08-07 DIAGNOSIS — E538 Deficiency of other specified B group vitamins: Secondary | ICD-10-CM | POA: Insufficient documentation

## 2016-08-07 DIAGNOSIS — D569 Thalassemia, unspecified: Secondary | ICD-10-CM | POA: Insufficient documentation

## 2016-08-07 DIAGNOSIS — R5383 Other fatigue: Secondary | ICD-10-CM

## 2016-08-07 DIAGNOSIS — F17210 Nicotine dependence, cigarettes, uncomplicated: Secondary | ICD-10-CM | POA: Diagnosis not present

## 2016-08-07 DIAGNOSIS — N920 Excessive and frequent menstruation with regular cycle: Secondary | ICD-10-CM | POA: Diagnosis not present

## 2016-08-07 DIAGNOSIS — D508 Other iron deficiency anemias: Secondary | ICD-10-CM

## 2016-08-07 LAB — CBC WITH DIFFERENTIAL/PLATELET
Band Neutrophils: 1 %
Basophils Absolute: 0.1 10*3/uL (ref 0–0.1)
Basophils Relative: 1 %
EOS ABS: 0.4 10*3/uL (ref 0–0.7)
Eosinophils Relative: 4 %
HCT: 34.1 % — ABNORMAL LOW (ref 35.0–47.0)
HEMOGLOBIN: 10.7 g/dL — AB (ref 12.0–16.0)
LYMPHS PCT: 40 %
Lymphs Abs: 3.8 10*3/uL — ABNORMAL HIGH (ref 1.0–3.6)
MCH: 23.5 pg — ABNORMAL LOW (ref 26.0–34.0)
MCHC: 31.3 g/dL — ABNORMAL LOW (ref 32.0–36.0)
MCV: 75 fL — ABNORMAL LOW (ref 80.0–100.0)
MONOS PCT: 4 %
Monocytes Absolute: 0.4 10*3/uL (ref 0.2–0.9)
Neutro Abs: 4.7 10*3/uL (ref 1.4–6.5)
Neutrophils Relative %: 50 %
Platelets: 211 10*3/uL (ref 150–440)
RBC: 4.55 MIL/uL (ref 3.80–5.20)
RDW: 18.6 % — ABNORMAL HIGH (ref 11.5–14.5)
WBC: 9.4 10*3/uL (ref 3.6–11.0)

## 2016-08-07 LAB — FERRITIN: FERRITIN: 24 ng/mL (ref 11–307)

## 2016-08-07 LAB — IRON AND TIBC
Iron: 27 ug/dL — ABNORMAL LOW (ref 28–170)
Saturation Ratios: 8 % — ABNORMAL LOW (ref 10.4–31.8)
TIBC: 352 ug/dL (ref 250–450)
UIBC: 325 ug/dL

## 2016-08-07 NOTE — Progress Notes (Signed)
Complains of feeling tired today. 

## 2016-08-18 ENCOUNTER — Ambulatory Visit (INDEPENDENT_AMBULATORY_CARE_PROVIDER_SITE_OTHER): Payer: Medicare Other | Admitting: Obstetrics and Gynecology

## 2016-08-18 VITALS — BP 115/76 | HR 76 | Ht 72.0 in | Wt 156.2 lb

## 2016-08-18 DIAGNOSIS — N926 Irregular menstruation, unspecified: Secondary | ICD-10-CM

## 2016-08-18 MED ORDER — MEDROXYPROGESTERONE ACETATE 150 MG/ML IM SUSP
150.0000 mg | Freq: Once | INTRAMUSCULAR | Status: AC
  Administered 2016-08-18: 150 mg via INTRAMUSCULAR

## 2016-08-18 NOTE — Progress Notes (Signed)
Pt is here for a depo inj. No c/o

## 2016-09-17 NOTE — Progress Notes (Signed)
[facility] MD/PA/NP OP Progress Note  09/22/2016 3:34 PM Stacy Pineda  MRN:  [id number]  Chief Complaint:  Chief Complaint    Anxiety; Depression; Follow-up     Subjective:  "I am not able to control my anxiety" HPI:  Stacy Pineda is a 36 year old female with depression, anxiety, PTSD, migraine, asthma, s/p gastric bypass, s/p chronic pancreatitis who is transferred from Dr. Gretel Acre.   Patient states that she discontinued mirtazapine as she didn't find any benefit from that medication. She feels that her anxiety is uncontrollable. She has panic attacks at school and has had difficulty in concentration. She talks about financial strain as her rent increased and is not on medicaid anymore. She is concerned that she is not able to afford seeing a therapist. She tends to isolate herself. Patient reports insomnia with racing thought. She feels more restless and has started to smoke cigar which she had been abstinent for many years. She has not used marijuana anymore, as she "don't care." She denies alcohol sue. She has passive SI but denies any plans/intent.   Visit Diagnosis:    ICD-9-CM ICD-10-CM   1. MDD (major depressive disorder), recurrent episode, moderate (HCC) 296.32 F33.1   2. PTSD (post-traumatic stress disorder) 309.81 F43.10   3. GAD (generalized anxiety disorder) 300.02 F41.1     Past Psychiatric History:  Outpatient: Used to see Dr. Gretel Acre, reports she was diagnosed depression since age 37. She sees a therapist, [facility], Elmyra Ricks.  Psychiatry admission: twice, day program, in 2009, and 2012 for depression Previous suicide attempt: denies suicide attempt, denies SIB Past trials of medication:Prozac Celexa, Buspar, mirtazapine ("stopped working"), hydroxyzine, Klonopin, alprazolam, prazosin (limited benefit), Abilify, Seroquel (lethargic), trazodone (down, depressed)  Past Medical History:  Past Medical History:  Diagnosis Date  . Anxiety   . Asthma   . Chronic  headaches   . Depression   . Pancreatitis   . PTSD (post-traumatic stress disorder)     Past Surgical History:  Procedure Laterality Date  . ABDOMINAL SURGERY    . CHOLECYSTECTOMY    . DECUBITUS ULCER EXCISION    . GASTRIC BYPASS      Family Psychiatric History: mother- (physically abused), father- anxiety,   Family History:  Family History  Problem Relation Age of Onset  . Graves' disease Mother   . Hypertension Mother   . Hyperlipidemia Mother   . Anxiety disorder Father   . Prostate cancer Father   . Depression Brother   . Anxiety disorder Brother   . Anxiety disorder Maternal Aunt   . Depression Maternal Aunt   . Anxiety disorder Paternal Aunt   . Depression Paternal Aunt   . Anxiety disorder Maternal Uncle   . Depression Maternal Uncle   . Anxiety disorder Paternal Uncle   . Depression Paternal Uncle   . Anxiety disorder Maternal Grandfather   . Depression Maternal Grandfather   . Anxiety disorder Maternal Grandmother   . Depression Maternal Grandmother     Social History:  Social History   Social History  . Marital status: Single    Spouse name: N/A  . Number of children: N/A  . Years of education: N/A   Social History Main Topics  . Smoking status: Current Some Day Smoker    Types: Cigarettes, Cigars, E-cigarettes    Start date: 03/26/2005  . Smokeless tobacco: Never Used  . Alcohol use No     Comment: social  . Drug use: Yes    Frequency: 3.0 times  per week    Types: Marijuana     Comment: last used about 3 days ago  . Sexual activity: Not Currently    Birth control/ protection: Injection   Other Topics Concern  . None   Social History Narrative  . None    Allergies:  Allergies  Allergen Reactions  . Fentanyl Itching  . Morphine And Related Itching    Itchy.   . Vicodin [Hydrocodone-Acetaminophen] Itching  . Buprenorphine Hcl Itching    Itchy.     Metabolic Disorder Labs: No results found for: HGBA1C, MPG No results found for:  PROLACTIN No results found for: CHOL, TRIG, HDL, CHOLHDL, VLDL, LDLCALC   Current Medications: Current Outpatient Prescriptions  Medication Sig Dispense Refill  . albuterol (VENTOLIN HFA) 108 (90 Base) MCG/ACT inhaler     . ASMANEX HFA 100 MCG/ACT AERO     . butalbital-acetaminophen-caffeine (FIORICET) 50-325-40 MG tablet Take 1-2 tablets by mouth every 6 (six) hours as needed for headache. 20 tablet 0  . CREON 36000 UNITS CPEP capsule     . escitalopram (LEXAPRO) 20 MG tablet Take 1 tablet (20 mg total) by mouth daily. 30 tablet 1  . medroxyPROGESTERone (DEPO-PROVERA) 150 MG/ML injection INJECT 1 ML (150 MG TOTAL) INTO THE MUSCLE EVERY 3 MONTHS 1 mL 3  . metroNIDAZOLE (METROGEL) 0.75 % vaginal gel Place 1 Applicatorful vaginally at bedtime. Apply one applicatorful to vagina at bedtime for 5 days 70 g 1  . ondansetron (ZOFRAN) 4 MG tablet Take 4 mg by mouth.    . pregabalin (LYRICA) 200 MG capsule Take 200 mg by mouth 3 (three) times daily.     Marland Kitchen topiramate (TOPAMAX) 100 MG tablet Take 1 tablet (100 mg total) by mouth daily. (Patient taking differently: Take 100 mg by mouth 2 (two) times daily. ) 30 tablet 1  . clonazePAM (KLONOPIN) 0.5 MG tablet Take 0.5 tablets (0.25 mg total) by mouth 2 (two) times daily as needed for anxiety. 60 tablet 0  . promethazine (PHENERGAN) 25 MG tablet Take 25 mg by mouth.    . ziprasidone (GEODON) 20 MG capsule Take 1 capsule (20 mg total) by mouth 2 (two) times daily with a meal. 60 capsule 1   No current facility-administered medications for this visit.     Neurologic: Headache: No Seizure: No Paresthesias: No  Musculoskeletal: Strength & Muscle Tone: within normal limits Gait & Station: normal Patient leans: N/A  Psychiatric Specialty Exam: Review of Systems  Neurological: Positive for headaches.  Psychiatric/Behavioral: Positive for depression. Negative for hallucinations, substance abuse and suicidal ideas. The patient is nervous/anxious and has  insomnia.   All other systems reviewed and are negative.   Blood pressure 122/68, pulse 78, height 6' (1.829 m), weight 156 lb 3.2 oz (70.9 kg).Body mass index is 21.18 kg/m.  General Appearance: Well Groomed  Eye Contact:  Good  Speech:  Clear and Coherent  Volume:  Normal  Mood:  Depressed  Affect:  Restricted and anxious  Thought Process:  Coherent and Goal Directed  Orientation:  Full (Time, Place, and Person)  Thought Content: Logical  Perceptions: denies AH/VH  Suicidal Thoughts:  No  Homicidal Thoughts:  No  Memory:  Immediate;   Good Recent;   Good Remote;   Good  Judgement:  Good  Insight:  Fair  Psychomotor Activity:  slightly increased, restless  Concentration:  Concentration: Good and Attention Span: Good  Recall:  Good  Fund of Knowledge: Good  Language: Good  Akathisia:  NA  Handed:  Right  AIMS (if indicated):  N/A  Assets:  Communication Skills Desire for Improvement  ADL's:  Intact  Cognition: WNL  Sleep:  insomnia   Assessment Stacy Pineda is a 36 year old female with depression, anxiety, PTSD, iron deficiency anemia, migraine, asthma, s/p gastric bypass, s/p chronic pancreatitis who is transferred from Dr. Gretel Acre. Psychosocial stressors including history of trauma and school.   # PTSD # MDD # GAD Exam is notable for her worsening significant anxiety and patient also endorses neurovegetative symptoms. Although she responded some to mirtazapine, she prefers not to be on this medication anymore, claiming that it did not help. Will add ziprasidone to target her mood. Will have clonazepam prn given her severity of anxiety. Discussed side effect of physical dependence and somnolence. Discussed cognitive distortion of mind filtering and catastrophizing; patient will greatly benefit from continuing to see her therapist as her financial issues resolve.   # r/o Marijuana use disorder Patient has been abstinent from marijuana use for the past month. Will continue  motivational interview.  Plan 1. Continue Lexapro 20 mg daily 2. Start ziprasidone 20 mg twice a day with meal 3. Start clonazepam 0.25 mg twice a day as needed for anxiety 4. Return to clinic in one month (patient is on pregabalin 200 mg TID)  The patient demonstrates the following risk factors for suicide: Chronic risk factors for suicide include: psychiatric disorder of depression, anxiety, PTSD and chronic pain. Acute risk factors for suicide include: N/A. Protective factors for this patient include: positive therapeutic relationship, coping skills and hope for the future. Considering these factors, the overall suicide risk at this point appears to be low. Patient is appropriate for outpatient follow up.  Treatment Plan Summary:Plan as above]  Norman Clay, MD 09/22/2016, 3:34 PM

## 2016-09-22 ENCOUNTER — Ambulatory Visit (INDEPENDENT_AMBULATORY_CARE_PROVIDER_SITE_OTHER): Payer: Medicare Other | Admitting: Psychiatry

## 2016-09-22 ENCOUNTER — Encounter (HOSPITAL_COMMUNITY): Payer: Self-pay | Admitting: Psychiatry

## 2016-09-22 VITALS — BP 122/68 | HR 78 | Ht 72.0 in | Wt 156.2 lb

## 2016-09-22 DIAGNOSIS — Z79899 Other long term (current) drug therapy: Secondary | ICD-10-CM

## 2016-09-22 DIAGNOSIS — Z818 Family history of other mental and behavioral disorders: Secondary | ICD-10-CM

## 2016-09-22 DIAGNOSIS — F411 Generalized anxiety disorder: Secondary | ICD-10-CM | POA: Diagnosis not present

## 2016-09-22 DIAGNOSIS — F17210 Nicotine dependence, cigarettes, uncomplicated: Secondary | ICD-10-CM

## 2016-09-22 DIAGNOSIS — Z9889 Other specified postprocedural states: Secondary | ICD-10-CM

## 2016-09-22 DIAGNOSIS — F1290 Cannabis use, unspecified, uncomplicated: Secondary | ICD-10-CM

## 2016-09-22 DIAGNOSIS — Z9049 Acquired absence of other specified parts of digestive tract: Secondary | ICD-10-CM

## 2016-09-22 DIAGNOSIS — Z8042 Family history of malignant neoplasm of prostate: Secondary | ICD-10-CM

## 2016-09-22 DIAGNOSIS — Z888 Allergy status to other drugs, medicaments and biological substances status: Secondary | ICD-10-CM

## 2016-09-22 DIAGNOSIS — Z8249 Family history of ischemic heart disease and other diseases of the circulatory system: Secondary | ICD-10-CM

## 2016-09-22 DIAGNOSIS — F4310 Post-traumatic stress disorder, unspecified: Secondary | ICD-10-CM

## 2016-09-22 DIAGNOSIS — F331 Major depressive disorder, recurrent, moderate: Secondary | ICD-10-CM

## 2016-09-22 MED ORDER — ESCITALOPRAM OXALATE 20 MG PO TABS: 20.0000 mg | ORAL_TABLET | Freq: Every day | ORAL | 1 refills | Status: DC

## 2016-09-22 MED ORDER — CLONAZEPAM 0.5 MG PO TABS: 0.2500 mg | ORAL_TABLET | Freq: Two times a day (BID) | ORAL | 0 refills | Status: DC | PRN

## 2016-09-22 MED ORDER — ZIPRASIDONE HCL 20 MG PO CAPS: 20.0000 mg | ORAL_CAPSULE | Freq: Two times a day (BID) | ORAL | 1 refills | Status: DC

## 2016-09-22 NOTE — Patient Instructions (Addendum)
1. Continue Lexapro 20 mg daily 2. Start ziprasidone 20 mg twice a day with meal 3. Start clonazepam 0.25 mg twice a day as needed for anxiety 4. Return to clinic in one month

## 2016-10-06 DIAGNOSIS — K529 Noninfective gastroenteritis and colitis, unspecified: Secondary | ICD-10-CM | POA: Diagnosis not present

## 2016-10-06 DIAGNOSIS — Z9889 Other specified postprocedural states: Secondary | ICD-10-CM | POA: Diagnosis not present

## 2016-10-06 DIAGNOSIS — G8929 Other chronic pain: Secondary | ICD-10-CM | POA: Diagnosis not present

## 2016-10-06 DIAGNOSIS — R109 Unspecified abdominal pain: Secondary | ICD-10-CM | POA: Diagnosis not present

## 2016-10-06 DIAGNOSIS — F329 Major depressive disorder, single episode, unspecified: Secondary | ICD-10-CM | POA: Diagnosis not present

## 2016-10-06 DIAGNOSIS — Z9884 Bariatric surgery status: Secondary | ICD-10-CM | POA: Diagnosis not present

## 2016-10-06 DIAGNOSIS — K861 Other chronic pancreatitis: Secondary | ICD-10-CM | POA: Diagnosis not present

## 2016-10-06 DIAGNOSIS — G894 Chronic pain syndrome: Secondary | ICD-10-CM | POA: Diagnosis not present

## 2016-10-06 DIAGNOSIS — R1013 Epigastric pain: Secondary | ICD-10-CM | POA: Diagnosis not present

## 2016-10-06 DIAGNOSIS — M792 Neuralgia and neuritis, unspecified: Secondary | ICD-10-CM | POA: Diagnosis not present

## 2016-10-06 DIAGNOSIS — F411 Generalized anxiety disorder: Secondary | ICD-10-CM | POA: Diagnosis not present

## 2016-10-06 DIAGNOSIS — F4310 Post-traumatic stress disorder, unspecified: Secondary | ICD-10-CM | POA: Diagnosis not present

## 2016-10-06 DIAGNOSIS — K289 Gastrojejunal ulcer, unspecified as acute or chronic, without hemorrhage or perforation: Secondary | ICD-10-CM | POA: Diagnosis not present

## 2016-10-06 DIAGNOSIS — R51 Headache: Secondary | ICD-10-CM | POA: Diagnosis not present

## 2016-10-06 DIAGNOSIS — Z934 Other artificial openings of gastrointestinal tract status: Secondary | ICD-10-CM | POA: Diagnosis not present

## 2016-10-24 ENCOUNTER — Ambulatory Visit: Payer: Self-pay

## 2016-10-24 ENCOUNTER — Telehealth: Payer: Self-pay | Admitting: Obstetrics and Gynecology

## 2016-10-24 NOTE — Telephone Encounter (Signed)
Patient called wondering if Stacy Pineda could call in a refill on the vaginal irritation cream that she got one time before. She uses the [facility] on [address]. Please advise.

## 2016-10-25 DIAGNOSIS — K529 Noninfective gastroenteritis and colitis, unspecified: Secondary | ICD-10-CM | POA: Diagnosis not present

## 2016-10-25 DIAGNOSIS — R112 Nausea with vomiting, unspecified: Secondary | ICD-10-CM | POA: Diagnosis not present

## 2016-10-25 DIAGNOSIS — R197 Diarrhea, unspecified: Secondary | ICD-10-CM | POA: Diagnosis not present

## 2016-10-25 DIAGNOSIS — K859 Acute pancreatitis without necrosis or infection, unspecified: Secondary | ICD-10-CM | POA: Diagnosis not present

## 2016-10-25 DIAGNOSIS — R140 Abdominal distension (gaseous): Secondary | ICD-10-CM | POA: Diagnosis not present

## 2016-10-25 DIAGNOSIS — E860 Dehydration: Secondary | ICD-10-CM | POA: Diagnosis not present

## 2016-10-25 DIAGNOSIS — R109 Unspecified abdominal pain: Secondary | ICD-10-CM | POA: Diagnosis not present

## 2016-10-25 DIAGNOSIS — K861 Other chronic pancreatitis: Secondary | ICD-10-CM | POA: Diagnosis not present

## 2016-10-25 DIAGNOSIS — G8929 Other chronic pain: Secondary | ICD-10-CM | POA: Diagnosis not present

## 2016-10-26 DIAGNOSIS — K859 Acute pancreatitis without necrosis or infection, unspecified: Secondary | ICD-10-CM | POA: Diagnosis not present

## 2016-10-26 DIAGNOSIS — K861 Other chronic pancreatitis: Secondary | ICD-10-CM | POA: Diagnosis not present

## 2016-10-26 DIAGNOSIS — R109 Unspecified abdominal pain: Secondary | ICD-10-CM | POA: Diagnosis not present

## 2016-10-26 DIAGNOSIS — I10 Essential (primary) hypertension: Secondary | ICD-10-CM | POA: Diagnosis not present

## 2016-10-26 DIAGNOSIS — G8929 Other chronic pain: Secondary | ICD-10-CM | POA: Diagnosis not present

## 2016-10-26 DIAGNOSIS — K529 Noninfective gastroenteritis and colitis, unspecified: Secondary | ICD-10-CM | POA: Diagnosis not present

## 2016-10-27 ENCOUNTER — Other Ambulatory Visit: Payer: Self-pay | Admitting: *Deleted

## 2016-10-27 ENCOUNTER — Ambulatory Visit: Payer: Self-pay

## 2016-10-27 DIAGNOSIS — G43011 Migraine without aura, intractable, with status migrainosus: Secondary | ICD-10-CM | POA: Diagnosis not present

## 2016-10-27 DIAGNOSIS — R109 Unspecified abdominal pain: Secondary | ICD-10-CM | POA: Diagnosis not present

## 2016-10-27 DIAGNOSIS — Z9884 Bariatric surgery status: Secondary | ICD-10-CM | POA: Diagnosis not present

## 2016-10-27 DIAGNOSIS — K859 Acute pancreatitis without necrosis or infection, unspecified: Secondary | ICD-10-CM | POA: Diagnosis not present

## 2016-10-27 DIAGNOSIS — R933 Abnormal findings on diagnostic imaging of other parts of digestive tract: Secondary | ICD-10-CM | POA: Diagnosis not present

## 2016-10-27 DIAGNOSIS — G8929 Other chronic pain: Secondary | ICD-10-CM | POA: Diagnosis not present

## 2016-10-27 DIAGNOSIS — R51 Headache: Secondary | ICD-10-CM | POA: Diagnosis not present

## 2016-10-27 DIAGNOSIS — I10 Essential (primary) hypertension: Secondary | ICD-10-CM | POA: Diagnosis not present

## 2016-10-27 DIAGNOSIS — K861 Other chronic pancreatitis: Secondary | ICD-10-CM | POA: Diagnosis not present

## 2016-10-27 DIAGNOSIS — K529 Noninfective gastroenteritis and colitis, unspecified: Secondary | ICD-10-CM | POA: Diagnosis not present

## 2016-10-27 MED ORDER — METRONIDAZOLE 0.75 % VA GEL: 1.0000 | Freq: Every day | VAGINAL | 1 refills | Status: DC

## 2016-10-27 NOTE — Telephone Encounter (Signed)
meds sent

## 2016-10-27 NOTE — Telephone Encounter (Signed)
Done-ac 

## 2016-10-28 ENCOUNTER — Ambulatory Visit: Payer: Self-pay

## 2016-10-28 DIAGNOSIS — I10 Essential (primary) hypertension: Secondary | ICD-10-CM | POA: Diagnosis not present

## 2016-10-28 DIAGNOSIS — R109 Unspecified abdominal pain: Secondary | ICD-10-CM | POA: Diagnosis not present

## 2016-10-28 DIAGNOSIS — K859 Acute pancreatitis without necrosis or infection, unspecified: Secondary | ICD-10-CM | POA: Diagnosis not present

## 2016-10-28 DIAGNOSIS — R1013 Epigastric pain: Secondary | ICD-10-CM | POA: Diagnosis not present

## 2016-10-28 DIAGNOSIS — G8929 Other chronic pain: Secondary | ICD-10-CM | POA: Diagnosis not present

## 2016-10-28 DIAGNOSIS — G43011 Migraine without aura, intractable, with status migrainosus: Secondary | ICD-10-CM | POA: Diagnosis not present

## 2016-10-28 DIAGNOSIS — R740 Nonspecific elevation of levels of transaminase and lactic acid dehydrogenase [LDH]: Secondary | ICD-10-CM | POA: Diagnosis not present

## 2016-10-28 DIAGNOSIS — K861 Other chronic pancreatitis: Secondary | ICD-10-CM | POA: Diagnosis not present

## 2016-10-28 DIAGNOSIS — K529 Noninfective gastroenteritis and colitis, unspecified: Secondary | ICD-10-CM | POA: Diagnosis not present

## 2016-10-29 ENCOUNTER — Ambulatory Visit (HOSPITAL_COMMUNITY): Payer: Self-pay | Admitting: Psychiatry

## 2016-10-29 DIAGNOSIS — K859 Acute pancreatitis without necrosis or infection, unspecified: Secondary | ICD-10-CM | POA: Diagnosis not present

## 2016-10-29 DIAGNOSIS — I10 Essential (primary) hypertension: Secondary | ICD-10-CM | POA: Diagnosis not present

## 2016-10-29 DIAGNOSIS — R109 Unspecified abdominal pain: Secondary | ICD-10-CM | POA: Diagnosis not present

## 2016-10-29 DIAGNOSIS — K529 Noninfective gastroenteritis and colitis, unspecified: Secondary | ICD-10-CM | POA: Diagnosis not present

## 2016-10-29 DIAGNOSIS — K861 Other chronic pancreatitis: Secondary | ICD-10-CM | POA: Diagnosis not present

## 2016-10-29 DIAGNOSIS — G8929 Other chronic pain: Secondary | ICD-10-CM | POA: Diagnosis not present

## 2016-10-30 DIAGNOSIS — K859 Acute pancreatitis without necrosis or infection, unspecified: Secondary | ICD-10-CM | POA: Diagnosis not present

## 2016-10-30 DIAGNOSIS — R109 Unspecified abdominal pain: Secondary | ICD-10-CM | POA: Diagnosis not present

## 2016-10-30 DIAGNOSIS — I10 Essential (primary) hypertension: Secondary | ICD-10-CM | POA: Diagnosis not present

## 2016-10-30 DIAGNOSIS — K861 Other chronic pancreatitis: Secondary | ICD-10-CM | POA: Diagnosis not present

## 2016-10-30 DIAGNOSIS — G8929 Other chronic pain: Secondary | ICD-10-CM | POA: Diagnosis not present

## 2016-10-30 DIAGNOSIS — K529 Noninfective gastroenteritis and colitis, unspecified: Secondary | ICD-10-CM | POA: Diagnosis not present

## 2016-11-04 ENCOUNTER — Ambulatory Visit (INDEPENDENT_AMBULATORY_CARE_PROVIDER_SITE_OTHER): Payer: Medicare Other | Admitting: Obstetrics and Gynecology

## 2016-11-04 VITALS — BP 127/80 | HR 86 | Wt 153.0 lb

## 2016-11-04 DIAGNOSIS — Z3042 Encounter for surveillance of injectable contraceptive: Secondary | ICD-10-CM | POA: Diagnosis not present

## 2016-11-04 MED ORDER — MEDROXYPROGESTERONE ACETATE 150 MG/ML IM SUSP
150.0000 mg | Freq: Once | INTRAMUSCULAR | Status: AC
  Administered 2016-11-04: 150 mg via INTRAMUSCULAR

## 2016-11-04 NOTE — Progress Notes (Signed)
Pt is here for her depo provera inj, she is doing well Denies any s/e

## 2016-11-06 DIAGNOSIS — G4452 New daily persistent headache (NDPH): Secondary | ICD-10-CM | POA: Diagnosis not present

## 2016-11-09 NOTE — Progress Notes (Deleted)
[facility]  Telephone:(336) [phone] [phone]) [phone]  ID: Stacy Pineda OB: Apr 21, 1981  MR#: [id number]  VFI#:[id number]  Patient Care Team: Ronnell Freshwater, NP as PCP - General (Family Medicine)  CHIEF COMPLAINT:  Iron deficiency anemia.  INTERVAL HISTORY: Patient returns to clinic today for repeat laboratory work and further evaluation. She complains of mild fatigue but states she is a full time student and she stays tired. She also states that she is no longer having heavy menstrual cycles since she began depo provera. She also has a family history of thalassemia and would like to be screened for this. She does not complain of weakness today. She has no neurologic complaints. She has a good appetite and denies weight loss. She denies any fevers. She denies any chest pain or shortness of breath. She denies any nausea, vomiting, constipation, or diarrhea. She has no urinary complaints. She previously had heavy menses, but these are now well controlled. Patient offers no specific complaints today.  REVIEW OF SYSTEMS:   Review of Systems  Constitutional: Negative for fever, malaise/fatigue and weight loss.  Respiratory: Negative.  Negative for cough and shortness of breath.   Cardiovascular: Negative.  Negative for chest pain and leg swelling.  Gastrointestinal: Negative for abdominal pain, blood in stool and melena.  Musculoskeletal: Negative.   Neurological: Negative.  Negative for weakness.  Psychiatric/Behavioral: Negative.  The patient is not nervous/anxious.     As per HPI. Otherwise, a complete review of systems is negative.  PAST MEDICAL HISTORY: Past Medical History:  Diagnosis Date  . Anxiety   . Asthma   . Chronic headaches   . Depression   . Pancreatitis   . PTSD (post-traumatic stress disorder)     PAST SURGICAL HISTORY: Past Surgical History:  Procedure Laterality Date  . ABDOMINAL SURGERY    . CHOLECYSTECTOMY    . DECUBITUS ULCER EXCISION     . GASTRIC BYPASS      FAMILY HISTORY Family History  Problem Relation Age of Onset  . Graves' disease Mother   . Hypertension Mother   . Hyperlipidemia Mother   . Anxiety disorder Father   . Prostate cancer Father   . Depression Brother   . Anxiety disorder Brother   . Anxiety disorder Maternal Aunt   . Depression Maternal Aunt   . Anxiety disorder Paternal Aunt   . Depression Paternal Aunt   . Anxiety disorder Maternal Uncle   . Depression Maternal Uncle   . Anxiety disorder Paternal Uncle   . Depression Paternal Uncle   . Anxiety disorder Maternal Grandfather   . Depression Maternal Grandfather   . Anxiety disorder Maternal Grandmother   . Depression Maternal Grandmother        ADVANCED DIRECTIVES:    HEALTH MAINTENANCE: Social History  Substance Use Topics  . Smoking status: Current Some Day Smoker    Types: Cigarettes, Cigars, E-cigarettes    Start date: 03/26/2005  . Smokeless tobacco: Never Used  . Alcohol use No     Comment: social     Allergies  Allergen Reactions  . Fentanyl Itching  . Morphine And Related Itching    Itchy.   . Vicodin [Hydrocodone-Acetaminophen] Itching  . Buprenorphine Hcl Itching    Itchy.     Current Outpatient Prescriptions  Medication Sig Dispense Refill  . albuterol (VENTOLIN HFA) 108 (90 Base) MCG/ACT inhaler     . ASMANEX HFA 100 MCG/ACT AERO     . clonazePAM (KLONOPIN) 0.5 MG tablet Take  0.5 tablets (0.25 mg total) by mouth 2 (two) times daily as needed for anxiety. 60 tablet 0  . CREON 36000 UNITS CPEP capsule     . escitalopram (LEXAPRO) 20 MG tablet Take 1 tablet (20 mg total) by mouth daily. 30 tablet 1  . medroxyPROGESTERone (DEPO-PROVERA) 150 MG/ML injection INJECT 1 ML (150 MG TOTAL) INTO THE MUSCLE EVERY 3 MONTHS 1 mL 3  . metroNIDAZOLE (METROGEL) 0.75 % vaginal gel Place 1 Applicatorful vaginally at bedtime. Apply one applicatorful to vagina at bedtime for 5 days (Patient not taking: Reported on 11/04/2016) 70 g  1  . ondansetron (ZOFRAN) 4 MG tablet Take 4 mg by mouth.    . pregabalin (LYRICA) 200 MG capsule Take 200 mg by mouth 3 (three) times daily.     . promethazine (PHENERGAN) 25 MG tablet Take 25 mg by mouth.    . topiramate (TOPAMAX) 100 MG tablet Take 1 tablet (100 mg total) by mouth daily. (Patient taking differently: Take 100 mg by mouth 2 (two) times daily. ) 30 tablet 1  . ziprasidone (GEODON) 20 MG capsule Take 1 capsule (20 mg total) by mouth 2 (two) times daily with a meal. 60 capsule 1   No current facility-administered medications for this visit.     OBJECTIVE: There were no vitals filed for this visit.   There is no height or weight on file to calculate BMI.    ECOG FS:0 - Asymptomatic  General: Well-developed, well-nourished, no acute distress. Eyes: Pink conjunctiva, anicteric sclera. Lungs: Clear to auscultation bilaterally. Heart: Regular rate and rhythm. No rubs, murmurs, or gallops. Abdomen: Soft, nontender, nondistended. No organomegaly noted, normoactive bowel sounds. Musculoskeletal: No edema, cyanosis, or clubbing. Neuro: Alert, answering all questions appropriately. Cranial nerves grossly intact. Skin: No rashes or petechiae noted. Psych: Normal affect.   LAB RESULTS:  Lab Results  Component Value Date   NA 142 04/07/2016   K 3.1 (L) 04/07/2016   CL 118 (H) 04/07/2016   CO2 18 (L) 04/07/2016   GLUCOSE 86 04/07/2016   BUN 13 04/07/2016   CREATININE 0.46 04/07/2016   CALCIUM 8.5 (L) 04/07/2016   PROT 6.3 (L) 04/07/2016   ALBUMIN 3.6 04/07/2016   AST 16 04/07/2016   ALT 14 04/07/2016   ALKPHOS 79 04/07/2016   BILITOT 0.4 04/07/2016   GFRNONAA >60 04/07/2016   GFRAA >60 04/07/2016    Lab Results  Component Value Date   WBC 9.4 08/07/2016   NEUTROABS 4.7 08/07/2016   HGB 10.7 (L) 08/07/2016   HCT 34.1 (L) 08/07/2016   MCV 75.0 (L) 08/07/2016   PLT 211 08/07/2016   Lab Results  Component Value Date   IRON 27 (L) 08/07/2016   TIBC 352 08/07/2016    IRONPCTSAT 8 (L) 08/07/2016    Lab Results  Component Value Date   FERRITIN 24 08/07/2016     STUDIES: No results found.  ASSESSMENT: Iron deficiency anemia.  PLAN:    1. Iron deficiency anemia: Patient's hemoglobin is essentially unchanged at 10.7, and ferritin is 24.  Previously, she had a mildly decreased folate and B-12, but the remainder of her laboratory work is within normal limits. She does not require additional Feraheme today. Patient last received IV Feraheme in June 2017. Return to clinic in 3 months with repeat laboratory work and further evaluation.  2. Heavy menses: Improving. She began Depro Provera a few months ago and it seems to make her menses lighter. Now that patient's menses are better controlled, she  may require less iron infusions. Continue treatment and monitoring per gynecology.  3. Thalassemia: Hemoglobin electrophoresis ordered.   Patient expressed understanding and was in agreement with this plan. She also understands that She can call clinic at any time with any questions, concerns, or complaints.   Faythe Casa, NP 08/07/2016  Patient was seen and evaluated independently and I agree with the assessment and plan as dictated above. Although patient's hemoglobin is unchanged, her iron stores have trended down and she likely will require IV Feraheme at her next visit.  Lloyd Huger, MD 11/09/16 10:53 PM

## 2016-11-10 ENCOUNTER — Inpatient Hospital Stay: Payer: Medicare Other

## 2016-11-10 ENCOUNTER — Inpatient Hospital Stay: Payer: Medicare Other | Admitting: Oncology

## 2016-11-11 ENCOUNTER — Encounter (HOSPITAL_COMMUNITY): Payer: Self-pay | Admitting: Psychiatry

## 2016-11-11 ENCOUNTER — Ambulatory Visit (INDEPENDENT_AMBULATORY_CARE_PROVIDER_SITE_OTHER): Payer: Medicare Other | Admitting: Psychiatry

## 2016-11-11 VITALS — BP 118/68 | HR 78 | Ht 72.0 in | Wt 150.4 lb

## 2016-11-11 DIAGNOSIS — F4001 Agoraphobia with panic disorder: Secondary | ICD-10-CM

## 2016-11-11 DIAGNOSIS — F17210 Nicotine dependence, cigarettes, uncomplicated: Secondary | ICD-10-CM

## 2016-11-11 DIAGNOSIS — F4312 Post-traumatic stress disorder, chronic: Secondary | ICD-10-CM

## 2016-11-11 DIAGNOSIS — F1290 Cannabis use, unspecified, uncomplicated: Secondary | ICD-10-CM | POA: Diagnosis not present

## 2016-11-11 DIAGNOSIS — Z818 Family history of other mental and behavioral disorders: Secondary | ICD-10-CM

## 2016-11-11 DIAGNOSIS — Z79899 Other long term (current) drug therapy: Secondary | ICD-10-CM

## 2016-11-11 MED ORDER — CLONAZEPAM 0.5 MG PO TABS: 0.2500 mg | ORAL_TABLET | Freq: Every day | ORAL | 0 refills | Status: DC

## 2016-11-11 MED ORDER — PAROXETINE HCL 20 MG PO TABS: 20.0000 mg | ORAL_TABLET | Freq: Every day | ORAL | 2 refills | Status: DC

## 2016-11-11 MED ORDER — ALPRAZOLAM 1 MG PO TABS: 1.0000 mg | ORAL_TABLET | Freq: Three times a day (TID) | ORAL | 1 refills | Status: DC | PRN

## 2016-11-11 MED ORDER — ZIPRASIDONE HCL 20 MG PO CAPS: 20.0000 mg | ORAL_CAPSULE | Freq: Every day | ORAL | 0 refills | Status: DC

## 2016-11-11 NOTE — Patient Instructions (Addendum)
STOP Lexapro  Start Paxil 10 mg for 1 week, then increase to 20 mg  Decrease clonazepam 0.25 (1/2 tablet) at night only, and in about 2 weeks, STOP the clonazepam and use as needed at night only  Decrease geodon to 20 mg at night only, and in about 2 weeks, stop geodon  Use Vistaril 50 mg or 100 mg ( 1 or 2 capsules) about 1-2 hours before bedtime for sleep  Use Xanax 1 mg 1-2 times a day as needed for panic attacks but try to use coping skills FIRST
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
## 2016-11-26 NOTE — Progress Notes (Signed)
[place]  [phone] [phone]  ID: Stacy Pineda OB: Oct 25, 1980  MR#: [id number]  YQM#:[id number]  Patient Care Team: Ronnell Freshwater, NP as PCP - General (Family Medicine)  CHIEF COMPLAINT:  Iron deficiency anemia.  INTERVAL HISTORY: Patient returns to clinic today for repeat laboratory work and further evaluation. She currently feels well and is asymptomatic. She does not complain of any weakness or fatigue. She is no longer no longer having heavy menstrual cycles since she began depo provera. She has no neurologic complaints. She has a good appetite and denies weight loss. She denies any fevers. She denies any chest pain or shortness of breath. She denies any nausea, vomiting, constipation, or diarrhea. She has no urinary complaints.  Patient offers no specific complaints today.  REVIEW OF SYSTEMS:   Review of Systems  Constitutional: Negative for fever, malaise/fatigue and weight loss.  Respiratory: Negative.  Negative for cough and shortness of breath.   Cardiovascular: Negative.  Negative for chest pain and leg swelling.  Gastrointestinal: Negative for abdominal pain, blood in stool and melena.  Genitourinary: Negative.   Musculoskeletal: Negative.   Neurological: Negative.  Negative for weakness.  Psychiatric/Behavioral: Negative.  The patient is not nervous/anxious.     As per HPI. Otherwise, a complete review of systems is negative.  PAST MEDICAL HISTORY: Past Medical History:  Diagnosis Date  . Anxiety   . Asthma   . Chronic headaches   . Depression   . Pancreatitis   . PTSD (post-traumatic stress disorder)     PAST SURGICAL HISTORY: Past Surgical History:  Procedure Laterality Date  . ABDOMINAL SURGERY    . CHOLECYSTECTOMY    . DECUBITUS ULCER EXCISION    . GASTRIC BYPASS      FAMILY HISTORY Family History  Problem Relation Age of Onset  . Graves' disease Mother   . Hypertension Mother   . Hyperlipidemia Mother    . Anxiety disorder Father   . Prostate cancer Father   . Depression Brother   . Anxiety disorder Brother   . Anxiety disorder Maternal Aunt   . Depression Maternal Aunt   . Anxiety disorder Paternal Aunt   . Depression Paternal Aunt   . Anxiety disorder Maternal Uncle   . Depression Maternal Uncle   . Anxiety disorder Paternal Uncle   . Depression Paternal Uncle   . Anxiety disorder Maternal Grandfather   . Depression Maternal Grandfather   . Anxiety disorder Maternal Grandmother   . Depression Maternal Grandmother        ADVANCED DIRECTIVES:    HEALTH MAINTENANCE: Social History  Substance Use Topics  . Smoking status: Current Some Day Smoker    Types: Cigarettes, Cigars, E-cigarettes    Start date: 03/26/2005  . Smokeless tobacco: Never Used  . Alcohol use No     Comment: social     Allergies  Allergen Reactions  . Fentanyl Itching  . Morphine And Related Itching    Itchy.   . Vicodin [Hydrocodone-Acetaminophen] Itching  . Buprenorphine Hcl Itching    Itchy.     Current Outpatient Prescriptions  Medication Sig Dispense Refill  . albuterol (VENTOLIN HFA) 108 (90 Base) MCG/ACT inhaler     . ALPRAZolam (XANAX) 1 MG tablet Take 1 tablet (1 mg total) by mouth 3 (three) times daily as needed for anxiety. 30 tablet 1  . ASMANEX HFA 100 MCG/ACT AERO     . CREON 36000 UNITS CPEP capsule     . medroxyPROGESTERone (DEPO-PROVERA)  150 MG/ML injection INJECT 1 ML (150 MG TOTAL) INTO THE MUSCLE EVERY 3 MONTHS 1 mL 3  . metroNIDAZOLE (METROGEL) 0.75 % vaginal gel Place 1 Applicatorful vaginally at bedtime. Apply one applicatorful to vagina at bedtime for 5 days 70 g 1  . ondansetron (ZOFRAN) 4 MG tablet Take 4 mg by mouth.    Marland Kitchen PARoxetine (PAXIL) 20 MG tablet Take 1 tablet (20 mg total) by mouth daily. 1/2 tablet for 1 week, then increase to the whole tablet 30 tablet 2  . pregabalin (LYRICA) 200 MG capsule Take 200 mg by mouth 3 (three) times daily.     . promethazine  (PHENERGAN) 25 MG tablet Take 25 mg by mouth.    . topiramate (TOPAMAX) 100 MG tablet Take 1 tablet (100 mg total) by mouth daily. (Patient taking differently: Take 100 mg by mouth 2 (two) times daily. ) 30 tablet 1  . traMADol (ULTRAM) 50 MG tablet Take 50 mg by mouth every 6 (six) hours as needed.     No current facility-administered medications for this visit.     OBJECTIVE: Vitals:   11/27/16 1500  BP: 110/73  Pulse: 74  Resp: 18  Temp: (!) 96.7 F (35.9 C)     Body mass index is 19.9 kg/m.    ECOG FS:0 - Asymptomatic  General: Well-developed, well-nourished, no acute distress. Eyes: Pink conjunctiva, anicteric sclera. Lungs: Clear to auscultation bilaterally. Heart: Regular rate and rhythm. No rubs, murmurs, or gallops. Abdomen: Soft, nontender, nondistended. No organomegaly noted, normoactive bowel sounds. Musculoskeletal: No edema, cyanosis, or clubbing. Neuro: Alert, answering all questions appropriately. Cranial nerves grossly intact. Skin: No rashes or petechiae noted. Psych: Normal affect.   LAB RESULTS:  Lab Results  Component Value Date   NA 142 04/07/2016   K 3.1 (L) 04/07/2016   CL 118 (H) 04/07/2016   CO2 18 (L) 04/07/2016   GLUCOSE 86 04/07/2016   BUN 13 04/07/2016   CREATININE 0.46 04/07/2016   CALCIUM 8.5 (L) 04/07/2016   PROT 6.3 (L) 04/07/2016   ALBUMIN 3.6 04/07/2016   AST 16 04/07/2016   ALT 14 04/07/2016   ALKPHOS 79 04/07/2016   BILITOT 0.4 04/07/2016   GFRNONAA >60 04/07/2016   GFRAA >60 04/07/2016    Lab Results  Component Value Date   WBC 6.9 11/27/2016   NEUTROABS 2.6 11/27/2016   HGB 11.2 (L) 11/27/2016   HCT 34.8 (L) 11/27/2016   MCV 73.0 (L) 11/27/2016   PLT 266 11/27/2016   Lab Results  Component Value Date   IRON 80 11/27/2016   TIBC 345 11/27/2016   IRONPCTSAT 23 11/27/2016    Lab Results  Component Value Date   FERRITIN 12 11/27/2016     STUDIES: No results found.  ASSESSMENT: Iron deficiency  anemia.  PLAN:    1. Iron deficiency anemia: Patient's hemoglobin is decreased, but mildly improved. Her iron stores continue to be within normal limits. Previously, the remainder of her laboratory work was within normal limits. She does not require additional Feraheme today. Patient last received IV Feraheme in June 2017. Return to clinic in 3 months with repeat laboratory work and further evaluation. If patient does not require IV iron at this time, she could possibly be discharged from clinic. 2. Heavy menses: Resolved. Continue Depo Provera per gynecology.  3. Thalassemia: Hemoglobin electrophoresis consistent with beta thalassemia minor. This may be contributing to her baseline anemia. 4. Smudge cells: Noted on peripheral smear. Will send for flow cytometry at next  clinic visit.  Patient expressed understanding and was in agreement with this plan. She also understands that She can call clinic at any time with any questions, concerns, or complaints.    Lloyd Huger, MD 11/29/16 9:21 AM

## 2016-11-27 ENCOUNTER — Inpatient Hospital Stay (HOSPITAL_BASED_OUTPATIENT_CLINIC_OR_DEPARTMENT_OTHER): Payer: Medicare Other | Admitting: Oncology

## 2016-11-27 ENCOUNTER — Inpatient Hospital Stay: Payer: Medicare Other | Attending: Oncology | Admitting: *Deleted

## 2016-11-27 ENCOUNTER — Inpatient Hospital Stay: Payer: Medicare Other

## 2016-11-27 VITALS — BP 110/73 | HR 74 | Temp 96.7°F | Resp 18 | Wt 146.7 lb

## 2016-11-27 DIAGNOSIS — D509 Iron deficiency anemia, unspecified: Secondary | ICD-10-CM

## 2016-11-27 DIAGNOSIS — D569 Thalassemia, unspecified: Secondary | ICD-10-CM

## 2016-11-27 DIAGNOSIS — F17210 Nicotine dependence, cigarettes, uncomplicated: Secondary | ICD-10-CM | POA: Insufficient documentation

## 2016-11-27 DIAGNOSIS — Z793 Long term (current) use of hormonal contraceptives: Secondary | ICD-10-CM | POA: Insufficient documentation

## 2016-11-27 DIAGNOSIS — Z885 Allergy status to narcotic agent status: Secondary | ICD-10-CM | POA: Diagnosis not present

## 2016-11-27 DIAGNOSIS — Z79899 Other long term (current) drug therapy: Secondary | ICD-10-CM

## 2016-11-27 DIAGNOSIS — G4452 New daily persistent headache (NDPH): Secondary | ICD-10-CM | POA: Diagnosis not present

## 2016-11-27 DIAGNOSIS — F418 Other specified anxiety disorders: Secondary | ICD-10-CM | POA: Diagnosis not present

## 2016-11-27 DIAGNOSIS — Z8042 Family history of malignant neoplasm of prostate: Secondary | ICD-10-CM | POA: Diagnosis not present

## 2016-11-27 DIAGNOSIS — F4310 Post-traumatic stress disorder, unspecified: Secondary | ICD-10-CM | POA: Insufficient documentation

## 2016-11-27 DIAGNOSIS — R42 Dizziness and giddiness: Secondary | ICD-10-CM | POA: Diagnosis not present

## 2016-11-27 DIAGNOSIS — E538 Deficiency of other specified B group vitamins: Secondary | ICD-10-CM

## 2016-11-27 DIAGNOSIS — D508 Other iron deficiency anemias: Secondary | ICD-10-CM

## 2016-11-27 DIAGNOSIS — J323 Chronic sphenoidal sinusitis: Secondary | ICD-10-CM | POA: Diagnosis not present

## 2016-11-27 DIAGNOSIS — G43909 Migraine, unspecified, not intractable, without status migrainosus: Secondary | ICD-10-CM | POA: Diagnosis not present

## 2016-11-27 LAB — CBC WITH DIFFERENTIAL/PLATELET
BASOS PCT: 1 %
Basophils Absolute: 0.1 10*3/uL (ref 0–0.1)
EOS PCT: 5 %
Eosinophils Absolute: 0.3 10*3/uL (ref 0–0.7)
HEMATOCRIT: 34.8 % — AB (ref 35.0–47.0)
Hemoglobin: 11.2 g/dL — ABNORMAL LOW (ref 12.0–16.0)
Lymphocytes Relative: 52 %
Lymphs Abs: 3.6 10*3/uL (ref 1.0–3.6)
MCH: 23.6 pg — ABNORMAL LOW (ref 26.0–34.0)
MCHC: 32.3 g/dL (ref 32.0–36.0)
MCV: 73 fL — AB (ref 80.0–100.0)
MONOS PCT: 5 %
Monocytes Absolute: 0.3 10*3/uL (ref 0.2–0.9)
NRBC: 1 /100{WBCs} — AB
Neutro Abs: 2.6 10*3/uL (ref 1.4–6.5)
Neutrophils Relative %: 37 %
Platelets: 266 10*3/uL (ref 150–440)
RBC: 4.77 MIL/uL (ref 3.80–5.20)
RDW: 18.4 % — ABNORMAL HIGH (ref 11.5–14.5)
SMEAR REVIEW: ADEQUATE
WBC: 6.9 10*3/uL (ref 3.6–11.0)

## 2016-11-27 LAB — IRON AND TIBC
Iron: 80 ug/dL (ref 28–170)
SATURATION RATIOS: 23 % (ref 10.4–31.8)
TIBC: 345 ug/dL (ref 250–450)
UIBC: 265 ug/dL

## 2016-11-27 LAB — FERRITIN: Ferritin: 12 ng/mL (ref 11–307)

## 2016-11-27 LAB — VITAMIN B12: VITAMIN B 12: 161 pg/mL — AB (ref 180–914)

## 2016-11-27 NOTE — Progress Notes (Signed)
Complains of chronic pain in back and upper left abdomen. Feels fatigued with decreased energy today.

## 2016-11-28 LAB — HEMOGLOBINOPATHY EVALUATION
HGB VARIANT: 0 %
Hgb A2 Quant: 4.1 % — ABNORMAL HIGH (ref 1.8–3.2)
Hgb A: 94.5 % — ABNORMAL LOW (ref 96.4–98.8)
Hgb C: 0 %
Hgb F Quant: 1.4 % (ref 0.0–2.0)
Hgb S Quant: 0 %

## 2016-12-12 ENCOUNTER — Other Ambulatory Visit (HOSPITAL_COMMUNITY): Payer: Self-pay | Admitting: Psychiatry

## 2016-12-12 DIAGNOSIS — F4312 Post-traumatic stress disorder, chronic: Secondary | ICD-10-CM

## 2016-12-29 ENCOUNTER — Encounter (HOSPITAL_COMMUNITY): Payer: Self-pay | Admitting: Psychiatry

## 2016-12-29 ENCOUNTER — Ambulatory Visit (INDEPENDENT_AMBULATORY_CARE_PROVIDER_SITE_OTHER): Payer: Medicare Other | Admitting: Psychiatry

## 2016-12-29 DIAGNOSIS — Z818 Family history of other mental and behavioral disorders: Secondary | ICD-10-CM | POA: Diagnosis not present

## 2016-12-29 DIAGNOSIS — F17210 Nicotine dependence, cigarettes, uncomplicated: Secondary | ICD-10-CM | POA: Diagnosis not present

## 2016-12-29 DIAGNOSIS — F4312 Post-traumatic stress disorder, chronic: Secondary | ICD-10-CM | POA: Diagnosis not present

## 2016-12-29 DIAGNOSIS — F4001 Agoraphobia with panic disorder: Secondary | ICD-10-CM

## 2016-12-29 MED ORDER — ALPRAZOLAM 0.5 MG PO TABS: 1.0000 mg | ORAL_TABLET | Freq: Three times a day (TID) | ORAL | 1 refills | Status: DC | PRN

## 2016-12-29 MED ORDER — PAROXETINE HCL 40 MG PO TABS
40.0000 mg | ORAL_TABLET | Freq: Every day | ORAL | 0 refills | Status: DC
Start: 2016-12-29 — End: 2017-04-02

## 2016-12-29 NOTE — Progress Notes (Signed)
[place] MD/PA/NP OP Progress Note  12/29/2016 10:19 AM Stacy Pineda  MRN:  [id number]  Chief Complaint: anxiety, panic, depression  Subjective:  Stacy Pineda reports that Paxil has been very well tolerated area she confirms that she is off of Geodon, Topamax, Lexapro, clonazepam.  She continues to have some panic episodes, approximately 1 per day. She uses Xanax about once a day, and it does the job of reducing anxiety and panic. We discussed increasing Paxil to 40 mg, and using Xanax 0.5 mg twice a day as needed.  No acute safety issues, and she agrees to follow-up in 3 months.  Spent time discussing some of the behavioral strategies she has been using, including breathing and going for walks.  Visit Diagnosis:    ICD-9-CM ICD-10-CM   1. Chronic post-traumatic stress disorder (PTSD) 309.81 F43.12 ALPRAZolam (XANAX) 0.5 MG tablet     PARoxetine (PAXIL) 40 MG tablet  2. Panic disorder with agoraphobia 300.21 F40.01 ALPRAZolam (XANAX) 0.5 MG tablet    Past Psychiatric History: She has a psychiatric history of intensive outpatient hospitalization, but no inpatient psychiatric hospitalizations. She has been tried on Prozac, Celexa, BuSpar, Remeron, Vistaril, clonazepam, Abilify, Seroquel, trazodone, Xanax, Zoloft. She presents with features of characterologic issues as well as PTSD. She has no history of suicide attempts or self-injurious behaviors  Past Medical History:  Past Medical History:  Diagnosis Date  . Anxiety   . Asthma   . Chronic headaches   . Depression   . Pancreatitis   . PTSD (post-traumatic stress disorder)     Past Surgical History:  Procedure Laterality Date  . ABDOMINAL SURGERY    . CHOLECYSTECTOMY    . DECUBITUS ULCER EXCISION    . GASTRIC BYPASS      Family Psychiatric History: Her mother has a history of PTSD, and her father has a history of anxiety  Family History:  Family History  Problem Relation Age of Onset  . Graves' disease Mother   . Hypertension  Mother   . Hyperlipidemia Mother   . Anxiety disorder Father   . Prostate cancer Father   . Depression Brother   . Anxiety disorder Brother   . Anxiety disorder Maternal Aunt   . Depression Maternal Aunt   . Anxiety disorder Paternal Aunt   . Depression Paternal Aunt   . Anxiety disorder Maternal Uncle   . Depression Maternal Uncle   . Anxiety disorder Paternal Uncle   . Depression Paternal Uncle   . Anxiety disorder Maternal Grandfather   . Depression Maternal Grandfather   . Anxiety disorder Maternal Grandmother   . Depression Maternal Grandmother     Social History:  Social History   Social History  . Marital status: Single    Spouse name: N/A  . Number of children: N/A  . Years of education: N/A   Social History Main Topics  . Smoking status: Current Some Day Smoker    Types: Cigarettes, Cigars, E-cigarettes    Start date: 03/26/2005  . Smokeless tobacco: Never Used  . Alcohol use No     Comment: social  . Drug use: Yes    Frequency: 3.0 times per week    Types: Marijuana     Comment: last used about 3 days ago  . Sexual activity: Not Currently    Birth control/ protection: Injection   Other Topics Concern  . None   Social History Narrative  . None    Allergies:  Allergies  Allergen Reactions  . Fentanyl Itching  .  Morphine And Related Itching    Itchy.   . Vicodin [Hydrocodone-Acetaminophen] Itching  . Buprenorphine Hcl Itching    Itchy.     Metabolic Disorder Labs: No results found for: HGBA1C, MPG No results found for: PROLACTIN No results found for: CHOL, TRIG, HDL, CHOLHDL, VLDL, LDLCALC   Current Medications: Current Outpatient Prescriptions  Medication Sig Dispense Refill  . albuterol (VENTOLIN HFA) 108 (90 Base) MCG/ACT inhaler     . ALPRAZolam (XANAX) 0.5 MG tablet Take 2 tablets (1 mg total) by mouth 3 (three) times daily as needed for anxiety. 90 tablet 1  . ASMANEX HFA 100 MCG/ACT AERO     . CREON 36000 UNITS CPEP capsule     .  medroxyPROGESTERone (DEPO-PROVERA) 150 MG/ML injection INJECT 1 ML (150 MG TOTAL) INTO THE MUSCLE EVERY 3 MONTHS 1 mL 3  . metroNIDAZOLE (METROGEL) 0.75 % vaginal gel Place 1 Applicatorful vaginally at bedtime. Apply one applicatorful to vagina at bedtime for 5 days 70 g 1  . ondansetron (ZOFRAN) 4 MG tablet Take 4 mg by mouth.    Marland Kitchen PARoxetine (PAXIL) 40 MG tablet Take 1 tablet (40 mg total) by mouth daily. 90 tablet 0  . pregabalin (LYRICA) 200 MG capsule Take 200 mg by mouth 3 (three) times daily.     . promethazine (PHENERGAN) 25 MG tablet Take 25 mg by mouth.    . traMADol (ULTRAM) 50 MG tablet Take 50 mg by mouth every 6 (six) hours as needed.     No current facility-administered medications for this visit.     Neurologic: Headache: Negative Seizure: Negative Paresthesias: Negative  Musculoskeletal: Strength & Muscle Tone: within normal limits Gait & Station: normal Patient leans: N/A  Psychiatric Specialty Exam: Review of Systems  Constitutional: Negative.   HENT: Negative.   Eyes: Negative.   Respiratory: Negative.   Gastrointestinal: Positive for heartburn.  Musculoskeletal: Negative.   Neurological: Negative.   Psychiatric/Behavioral: Positive for depression and memory loss. The patient is nervous/anxious.     Blood pressure 120/68, pulse 72, height 6' (1.829 m), weight 156 lb 9.6 oz (71 kg).Body mass index is 21.24 kg/m.  General Appearance: Casual and Fairly Groomed  Eye Contact:  Good  Speech:  Clear and Coherent  Volume:  Normal  Mood:  Anxious  Affect:  Appropriate  Thought Process:  Coherent  Orientation:  Full (Time, Place, and Person)  Thought Content: Logical   Suicidal Thoughts:  No  Homicidal Thoughts:  No  Memory:  Immediate;   Good  Judgement:  Fair  Insight:  Fair  Psychomotor Activity:  Normal  Concentration:  Concentration: Fair  Recall:  NA  Fund of Knowledge: Good  Language: Good  Akathisia:  Negative  Handed:  Right  AIMS (if  indicated):  na  Assets:  Communication Skills Desire for Improvement Housing Intimacy Leisure Time Social Support Transportation Vocational/Educational  ADL's:  Intact  Cognition: WNL  Sleep:  5-7 hours   Treatment Plan Summary: Stacy Pineda is a 36 year old female with a psychiatric history most consistent with PTSD and panic disorder who presents today for psychiatric follow-up care.  She has some features of personality disorder and often presents an external locus of control.  1. Chronic post-traumatic stress disorder (PTSD)   2. Panic disorder with agoraphobia    Increase Paxil to 40 mg daily alprazolam 0.5 mg twice a day for anxiety, and when necessary for sleep Return to clinic in 6-8 weeks  Aundra Dubin, MD 12/29/2016, 10:19 AM

## 2017-01-02 DIAGNOSIS — K861 Other chronic pancreatitis: Secondary | ICD-10-CM | POA: Diagnosis not present

## 2017-01-02 DIAGNOSIS — G894 Chronic pain syndrome: Secondary | ICD-10-CM | POA: Diagnosis not present

## 2017-01-02 DIAGNOSIS — G8929 Other chronic pain: Secondary | ICD-10-CM | POA: Diagnosis not present

## 2017-01-02 DIAGNOSIS — R51 Headache: Secondary | ICD-10-CM | POA: Diagnosis not present

## 2017-01-02 DIAGNOSIS — R109 Unspecified abdominal pain: Secondary | ICD-10-CM | POA: Diagnosis not present

## 2017-01-23 ENCOUNTER — Ambulatory Visit (INDEPENDENT_AMBULATORY_CARE_PROVIDER_SITE_OTHER): Payer: Medicare Other | Admitting: Obstetrics and Gynecology

## 2017-01-23 VITALS — BP 119/89 | HR 74 | Wt 163.8 lb

## 2017-01-23 DIAGNOSIS — Z3042 Encounter for surveillance of injectable contraceptive: Secondary | ICD-10-CM

## 2017-01-23 DIAGNOSIS — N926 Irregular menstruation, unspecified: Secondary | ICD-10-CM | POA: Diagnosis not present

## 2017-01-23 MED ORDER — MEDROXYPROGESTERONE ACETATE 150 MG/ML IM SUSP
150.0000 mg | Freq: Once | INTRAMUSCULAR | Status: AC
  Administered 2017-01-23: 150 mg via INTRAMUSCULAR

## 2017-01-23 NOTE — Progress Notes (Signed)
Patient ID: Stacy Pineda, female   DOB: 1980-12-02, 36 y.o.   MRN: [id number] Pt presents for depo-provera injection without any undesirable side effects.

## 2017-02-25 NOTE — Progress Notes (Signed)
[facility]  [phone] [phone]  ID: Stacy Pineda OB: 07-19-1981  MR#: [id number]  AGT#:[id number]  Patient Care Team: Ronnell Freshwater, NP as PCP - General (Family Medicine)  CHIEF COMPLAINT:  Iron deficiency anemia.  INTERVAL HISTORY: Patient returns to clinic today for repeat laboratory work and further evaluation. She currently feels well and is asymptomatic. She does not complain of any weakness or fatigue. She is no longer no longer having heavy menstrual cycles since she began depo provera. She has no neurologic complaints. She has a good appetite and denies weight loss. She denies any fevers. She denies any chest pain or shortness of breath. She denies any nausea, vomiting, constipation, or diarrhea. She has no urinary complaints.  Patient offers no specific complaints today.  REVIEW OF SYSTEMS:   Review of Systems  Constitutional: Negative for fever, malaise/fatigue and weight loss.  Respiratory: Negative.  Negative for cough and shortness of breath.   Cardiovascular: Negative.  Negative for chest pain and leg swelling.  Gastrointestinal: Negative for abdominal pain, blood in stool and melena.  Genitourinary: Negative.   Musculoskeletal: Negative.   Skin: Negative.  Negative for rash.  Neurological: Negative.  Negative for weakness.  Psychiatric/Behavioral: Negative.  The patient is not nervous/anxious.     As per HPI. Otherwise, a complete review of systems is negative.  PAST MEDICAL HISTORY: Past Medical History:  Diagnosis Date  . Anxiety   . Asthma   . Chronic headaches   . Depression   . Pancreatitis   . PTSD (post-traumatic stress disorder)     PAST SURGICAL HISTORY: Past Surgical History:  Procedure Laterality Date  . ABDOMINAL SURGERY    . CHOLECYSTECTOMY    . DECUBITUS ULCER EXCISION    . GASTRIC BYPASS      FAMILY HISTORY Family History  Problem Relation Age of Onset  . Graves' disease Mother   .  Hypertension Mother   . Hyperlipidemia Mother   . Anxiety disorder Father   . Prostate cancer Father   . Depression Brother   . Anxiety disorder Brother   . Anxiety disorder Maternal Aunt   . Depression Maternal Aunt   . Anxiety disorder Paternal Aunt   . Depression Paternal Aunt   . Anxiety disorder Maternal Uncle   . Depression Maternal Uncle   . Anxiety disorder Paternal Uncle   . Depression Paternal Uncle   . Anxiety disorder Maternal Grandfather   . Depression Maternal Grandfather   . Anxiety disorder Maternal Grandmother   . Depression Maternal Grandmother        ADVANCED DIRECTIVES:    HEALTH MAINTENANCE: Social History  Substance Use Topics  . Smoking status: Current Some Day Smoker    Types: Cigarettes, Cigars, E-cigarettes    Start date: 03/26/2005  . Smokeless tobacco: Never Used  . Alcohol use No     Comment: social     Allergies  Allergen Reactions  . Fentanyl Itching  . Morphine And Related Itching    Itchy.   . Vicodin [Hydrocodone-Acetaminophen] Itching    Current Outpatient Prescriptions  Medication Sig Dispense Refill  . albuterol (VENTOLIN HFA) 108 (90 Base) MCG/ACT inhaler     . ALPRAZolam (XANAX) 0.5 MG tablet Take 2 tablets (1 mg total) by mouth 3 (three) times daily as needed for anxiety. 90 tablet 1  . CREON 36000 UNITS CPEP capsule     . medroxyPROGESTERone (DEPO-PROVERA) 150 MG/ML injection INJECT 1 ML (150 MG TOTAL) INTO THE MUSCLE EVERY  3 MONTHS 1 mL 3  . PARoxetine (PAXIL) 40 MG tablet Take 1 tablet (40 mg total) by mouth daily. 90 tablet 0  . pregabalin (LYRICA) 200 MG capsule Take 200 mg by mouth 3 (three) times daily.     . traMADol (ULTRAM) 50 MG tablet Take 50 mg by mouth every 6 (six) hours as needed.    Darlin Priestly HFA 100 MCG/ACT AERO     . metroNIDAZOLE (METROGEL) 0.75 % vaginal gel Place 1 Applicatorful vaginally at bedtime. Apply one applicatorful to vagina at bedtime for 5 days (Patient not taking: Reported on 02/26/2017) 70 g  1  . ondansetron (ZOFRAN) 4 MG tablet Take 4 mg by mouth.    . promethazine (PHENERGAN) 25 MG tablet Take 25 mg by mouth.     No current facility-administered medications for this visit.     OBJECTIVE: Vitals:   02/26/17 1427  BP: 127/83  Pulse: 70  Resp: 18  Temp: (!) 97.2 F (36.2 C)     Body mass index is 22.05 kg/m.    ECOG FS:0 - Asymptomatic  General: Well-developed, well-nourished, no acute distress. Eyes: Pink conjunctiva, anicteric sclera. Lungs: Clear to auscultation bilaterally. Heart: Regular rate and rhythm. No rubs, murmurs, or gallops. Abdomen: Soft, nontender, nondistended. No organomegaly noted, normoactive bowel sounds. Musculoskeletal: No edema, cyanosis, or clubbing. Neuro: Alert, answering all questions appropriately. Cranial nerves grossly intact. Skin: No rashes or petechiae noted. Psych: Normal affect.   LAB RESULTS:  Lab Results  Component Value Date   NA 142 04/07/2016   K 3.1 (L) 04/07/2016   CL 118 (H) 04/07/2016   CO2 18 (L) 04/07/2016   GLUCOSE 86 04/07/2016   BUN 13 04/07/2016   CREATININE 0.46 04/07/2016   CALCIUM 8.5 (L) 04/07/2016   PROT 6.3 (L) 04/07/2016   ALBUMIN 3.6 04/07/2016   AST 16 04/07/2016   ALT 14 04/07/2016   ALKPHOS 79 04/07/2016   BILITOT 0.4 04/07/2016   GFRNONAA >60 04/07/2016   GFRAA >60 04/07/2016    Lab Results  Component Value Date   WBC 5.9 02/26/2017   NEUTROABS 3.6 02/26/2017   HGB 10.3 (L) 02/26/2017   HCT 32.0 (L) 02/26/2017   MCV 72.2 (L) 02/26/2017   PLT 240 02/26/2017   Lab Results  Component Value Date   IRON 44 02/26/2017   TIBC 406 02/26/2017   IRONPCTSAT 11 02/26/2017    Lab Results  Component Value Date   FERRITIN 19 02/26/2017     STUDIES: No results found.  ASSESSMENT: Iron deficiency anemia.  PLAN:    1. Iron deficiency anemia: Patient's hemoglobin is decreased, but her iron stores continue to be within normal limits. She is also asymptomatic. Previously, the  remainder of her laboratory work was within normal limits. She does not require additional Feraheme today. Patient last received IV Feraheme in June 2017. After discussion with the patient, it was agreed upon that no further follow-up is necessary. Please refer patient back if there are any questions or concerns.  2. Heavy menses: Resolved. Continue Depo Provera per gynecology.  3. Thalassemia: Hemoglobin electrophoresis consistent with beta thalassemia minor. This may be contributing to her baseline anemia. 4. Smudge cells: Noted on peripheral smear last clinic visit. Peripheral blood flow cytometry pending.  Patient expressed understanding and was in agreement with this plan. She also understands that She can call clinic at any time with any questions, concerns, or complaints.    Lloyd Huger, MD 03/02/17 6:50 AM

## 2017-02-26 ENCOUNTER — Inpatient Hospital Stay (HOSPITAL_BASED_OUTPATIENT_CLINIC_OR_DEPARTMENT_OTHER): Payer: Medicare Other | Admitting: Oncology

## 2017-02-26 ENCOUNTER — Inpatient Hospital Stay: Payer: Medicare Other

## 2017-02-26 ENCOUNTER — Inpatient Hospital Stay: Payer: Medicare Other | Attending: Oncology

## 2017-02-26 VITALS — BP 127/83 | HR 70 | Temp 97.2°F | Resp 18 | Wt 162.6 lb

## 2017-02-26 DIAGNOSIS — F418 Other specified anxiety disorders: Secondary | ICD-10-CM | POA: Diagnosis not present

## 2017-02-26 DIAGNOSIS — Z79899 Other long term (current) drug therapy: Secondary | ICD-10-CM | POA: Insufficient documentation

## 2017-02-26 DIAGNOSIS — D509 Iron deficiency anemia, unspecified: Secondary | ICD-10-CM

## 2017-02-26 DIAGNOSIS — D569 Thalassemia, unspecified: Secondary | ICD-10-CM | POA: Diagnosis not present

## 2017-02-26 DIAGNOSIS — Z885 Allergy status to narcotic agent status: Secondary | ICD-10-CM | POA: Diagnosis not present

## 2017-02-26 DIAGNOSIS — D508 Other iron deficiency anemias: Secondary | ICD-10-CM

## 2017-02-26 DIAGNOSIS — Z800 Family history of malignant neoplasm of digestive organs: Secondary | ICD-10-CM

## 2017-02-26 DIAGNOSIS — F17210 Nicotine dependence, cigarettes, uncomplicated: Secondary | ICD-10-CM | POA: Diagnosis not present

## 2017-02-26 LAB — CBC WITH DIFFERENTIAL/PLATELET
BASOS ABS: 0.1 10*3/uL (ref 0–0.1)
BASOS PCT: 2 %
EOS PCT: 4 %
Eosinophils Absolute: 0.2 10*3/uL (ref 0–0.7)
HEMATOCRIT: 32 % — AB (ref 35.0–47.0)
Hemoglobin: 10.3 g/dL — ABNORMAL LOW (ref 12.0–16.0)
Lymphocytes Relative: 26 %
Lymphs Abs: 1.5 10*3/uL (ref 1.0–3.6)
MCH: 23.3 pg — ABNORMAL LOW (ref 26.0–34.0)
MCHC: 32.3 g/dL (ref 32.0–36.0)
MCV: 72.2 fL — ABNORMAL LOW (ref 80.0–100.0)
MONO ABS: 0.4 10*3/uL (ref 0.2–0.9)
MONOS PCT: 6 %
Neutro Abs: 3.6 10*3/uL (ref 1.4–6.5)
Neutrophils Relative %: 62 %
PLATELETS: 240 10*3/uL (ref 150–440)
RBC: 4.43 MIL/uL (ref 3.80–5.20)
RDW: 17.8 % — AB (ref 11.5–14.5)
WBC: 5.9 10*3/uL (ref 3.6–11.0)

## 2017-02-26 LAB — IRON AND TIBC
IRON: 44 ug/dL (ref 28–170)
SATURATION RATIOS: 11 % (ref 10.4–31.8)
TIBC: 406 ug/dL (ref 250–450)
UIBC: 362 ug/dL

## 2017-02-26 LAB — FERRITIN: FERRITIN: 19 ng/mL (ref 11–307)

## 2017-03-02 ENCOUNTER — Ambulatory Visit (INDEPENDENT_AMBULATORY_CARE_PROVIDER_SITE_OTHER): Payer: Medicare Other | Admitting: Psychiatry

## 2017-03-02 ENCOUNTER — Encounter (HOSPITAL_COMMUNITY): Payer: Self-pay | Admitting: Psychiatry

## 2017-03-02 VITALS — BP 122/74 | HR 71 | Ht 72.0 in | Wt 168.4 lb

## 2017-03-02 DIAGNOSIS — F5104 Psychophysiologic insomnia: Secondary | ICD-10-CM | POA: Diagnosis not present

## 2017-03-02 DIAGNOSIS — Z818 Family history of other mental and behavioral disorders: Secondary | ICD-10-CM | POA: Diagnosis not present

## 2017-03-02 DIAGNOSIS — F1290 Cannabis use, unspecified, uncomplicated: Secondary | ICD-10-CM | POA: Diagnosis not present

## 2017-03-02 DIAGNOSIS — F410 Panic disorder [episodic paroxysmal anxiety] without agoraphobia: Secondary | ICD-10-CM

## 2017-03-02 DIAGNOSIS — F4001 Agoraphobia with panic disorder: Secondary | ICD-10-CM

## 2017-03-02 DIAGNOSIS — F17210 Nicotine dependence, cigarettes, uncomplicated: Secondary | ICD-10-CM

## 2017-03-02 DIAGNOSIS — F419 Anxiety disorder, unspecified: Secondary | ICD-10-CM | POA: Diagnosis not present

## 2017-03-02 DIAGNOSIS — F4312 Post-traumatic stress disorder, chronic: Secondary | ICD-10-CM | POA: Diagnosis not present

## 2017-03-02 MED ORDER — ZOLPIDEM TARTRATE 5 MG PO TABS: 5.0000 mg | ORAL_TABLET | Freq: Every evening | ORAL | 1 refills | Status: DC | PRN

## 2017-03-02 MED ORDER — ALPRAZOLAM 0.5 MG PO TABS: 1.0000 mg | ORAL_TABLET | Freq: Two times a day (BID) | ORAL | 0 refills | Status: DC | PRN

## 2017-03-02 NOTE — Progress Notes (Signed)
[facility] MD/PA/NP OP Progress Note  03/02/2017 10:55 AM Stacy Pineda  MRN:  [id number]  Chief Complaint: med management follow-up Subjective:  Stacy Pineda presents today for med management follow-up. She feels much more calm and her anxiety has been under good control with Paxil 40 mg. She uses Xanax about 3-5 times a week, and shows writer the rest of her bottle. She is quite proud that she has been able to avoid using Xanax daily. She reports that her sleep improved a little bit with Paxil, but she continues to wake up in the middle the night, and continues to have difficulty with sleep onset.  Spent time discussing the risks and benefits of Ambien, and the potential for parasomnia. Discussed that if she was to have any disturbance such as parasomnia or worsening mood, to discontinue the medicine. Discussed the potential for liver toxicity at doses above 5 mg and recommended that she use 2.5-5 mg nightly.  She denies any acute safety issues and agrees to follow-up in 3 months.  She reports that she is excited to be starting school in about 2 weeks, and she is also considering adopting a puppy. She reports that she had a pet fish but this passed away, and she realizes that having companionship at home is quite helpful and beneficial for her.  I expressed my support for her adopting an animal, especially considering therapy or support animal.  Visit Diagnosis:    ICD-10-CM   1. Psychophysiological insomnia F51.04 zolpidem (AMBIEN) 5 MG tablet    DISCONTINUED: zolpidem (AMBIEN) 5 MG tablet  2. Chronic post-traumatic stress disorder (PTSD) F43.12 ALPRAZolam (XANAX) 0.5 MG tablet  3. Panic disorder with agoraphobia F40.01 ALPRAZolam (XANAX) 0.5 MG tablet    Past Psychiatric History: See intake H&P for full details. Reviewed, with no updates at this time.  Past Medical History:  Past Medical History:  Diagnosis Date  . Anxiety   . Asthma   . Chronic headaches   . Depression   . Pancreatitis   . PTSD  (post-traumatic stress disorder)     Past Surgical History:  Procedure Laterality Date  . ABDOMINAL SURGERY    . CHOLECYSTECTOMY    . DECUBITUS ULCER EXCISION    . GASTRIC BYPASS      Family Psychiatric History: See intake H&P for full details. Reviewed, with no updates at this time.   Family History:  Family History  Problem Relation Age of Onset  . Graves' disease Mother   . Hypertension Mother   . Hyperlipidemia Mother   . Anxiety disorder Father   . Prostate cancer Father   . Depression Brother   . Anxiety disorder Brother   . Anxiety disorder Maternal Aunt   . Depression Maternal Aunt   . Anxiety disorder Paternal Aunt   . Depression Paternal Aunt   . Anxiety disorder Maternal Uncle   . Depression Maternal Uncle   . Anxiety disorder Paternal Uncle   . Depression Paternal Uncle   . Anxiety disorder Maternal Grandfather   . Depression Maternal Grandfather   . Anxiety disorder Maternal Grandmother   . Depression Maternal Grandmother     Social History:  Social History   Social History  . Marital status: Single    Spouse name: N/A  . Number of children: N/A  . Years of education: N/A   Social History Main Topics  . Smoking status: Current Some Day Smoker    Types: Cigarettes, Cigars, E-cigarettes    Start date: 03/26/2005  . Smokeless  tobacco: Never Used  . Alcohol use No     Comment: social  . Drug use: Yes    Frequency: 3.0 times per week    Types: Marijuana     Comment: last used about 3 days ago  . Sexual activity: Not Currently    Birth control/ protection: Injection   Other Topics Concern  . None   Social History Narrative  . None    Allergies:  Allergies  Allergen Reactions  . Fentanyl Itching  . Morphine And Related Itching    Itchy.   . Vicodin [Hydrocodone-Acetaminophen] Itching    Metabolic Disorder Labs: No results found for: HGBA1C, MPG No results found for: PROLACTIN No results found for: CHOL, TRIG, HDL, CHOLHDL, VLDL,  LDLCALC   Current Medications: Current Outpatient Prescriptions  Medication Sig Dispense Refill  . albuterol (VENTOLIN HFA) 108 (90 Base) MCG/ACT inhaler     . ALPRAZolam (XANAX) 0.5 MG tablet Take 2 tablets (1 mg total) by mouth 2 (two) times daily as needed for anxiety. 90 tablet 0  . ASMANEX HFA 100 MCG/ACT AERO     . CREON 36000 UNITS CPEP capsule     . medroxyPROGESTERone (DEPO-PROVERA) 150 MG/ML injection INJECT 1 ML (150 MG TOTAL) INTO THE MUSCLE EVERY 3 MONTHS 1 mL 3  . metroNIDAZOLE (METROGEL) 0.75 % vaginal gel Place 1 Applicatorful vaginally at bedtime. Apply one applicatorful to vagina at bedtime for 5 days (Patient not taking: Reported on 02/26/2017) 70 g 1  . ondansetron (ZOFRAN) 4 MG tablet Take 4 mg by mouth.    Marland Kitchen PARoxetine (PAXIL) 40 MG tablet Take 1 tablet (40 mg total) by mouth daily. 90 tablet 0  . pregabalin (LYRICA) 200 MG capsule Take 200 mg by mouth 3 (three) times daily.     . promethazine (PHENERGAN) 25 MG tablet Take 25 mg by mouth.    . traMADol (ULTRAM) 50 MG tablet Take 50 mg by mouth every 6 (six) hours as needed.    . zolpidem (AMBIEN) 5 MG tablet Take 1 tablet (5 mg total) by mouth at bedtime as needed for sleep. 30 tablet 1   No current facility-administered medications for this visit.     Neurologic: Headache: Negative Seizure: Negative Paresthesias: Negative  Musculoskeletal: Strength & Muscle Tone: within normal limits Gait & Station: normal Patient leans: N/A  Psychiatric Specialty Exam: ROS  Blood pressure 122/74, pulse 71, height 6' (1.829 m), weight 168 lb 6.4 oz (76.4 kg).Body mass index is 22.84 kg/m.  General Appearance: Casual and Well Groomed  Eye Contact:  Good  Speech:  Clear and Coherent  Volume:  Normal  Mood:  Euthymic  Affect:  Appropriate and Congruent  Thought Process:  Coherent and Goal Directed  Orientation:  Full (Time, Place, and Person)  Thought Content: Logical   Suicidal Thoughts:  No  Homicidal Thoughts:  No   Memory:  Immediate;   Good  Judgement:  Good  Insight:  Fair  Psychomotor Activity:  Normal  Concentration:  Concentration: Good  Recall:  Good  Fund of Knowledge: Good  Language: Good  Akathisia:  Negative  Handed:  Right  AIMS (if indicated):  0  Assets:  Communication Skills Desire for Improvement Financial Resources/Insurance Housing Leisure Time Transportation Vocational/Educational  ADL's:  Intact  Cognition: WNL  Sleep:  4-5 hours     Treatment Plan Summary: Stacy Pineda is a 36 year old female with chronic PTSD who presents for med management follow-up. She has had a excellent response to Paxil  40 mg daily, with a significant reduction in her need for Xanax.  The one area that she continues to struggle is related to psychophysiologic insomnia, with difficulties in sleep onset and maintenance. We agreed to initiate Ambien nightly, and follow-up in 2-3 months.  1. Psychophysiological insomnia   2. Chronic post-traumatic stress disorder (PTSD)   3. Panic disorder with agoraphobia    Initiate Ambien 5 mg nightly Continue Paxil 40 mg daily Xanax 0.5 mg tablet daily as needed for anxiety or panic; currently using about 3-5 times a week Return to clinic in 3 months  Aundra Dubin, MD 03/02/2017, 10:55 AM

## 2017-03-05 LAB — COMP PANEL: LEUKEMIA/LYMPHOMA

## 2017-04-02 ENCOUNTER — Other Ambulatory Visit (HOSPITAL_COMMUNITY): Payer: Self-pay | Admitting: Psychiatry

## 2017-04-02 ENCOUNTER — Other Ambulatory Visit: Payer: Self-pay | Admitting: Obstetrics and Gynecology

## 2017-04-02 DIAGNOSIS — F4312 Post-traumatic stress disorder, chronic: Secondary | ICD-10-CM

## 2017-04-03 ENCOUNTER — Ambulatory Visit (INDEPENDENT_AMBULATORY_CARE_PROVIDER_SITE_OTHER): Payer: Medicare Other | Admitting: Obstetrics and Gynecology

## 2017-04-03 VITALS — BP 121/85 | HR 86 | Wt 166.2 lb

## 2017-04-03 DIAGNOSIS — Z3042 Encounter for surveillance of injectable contraceptive: Secondary | ICD-10-CM | POA: Diagnosis not present

## 2017-04-03 MED ORDER — MEDROXYPROGESTERONE ACETATE 150 MG/ML IM SUSP
150.0000 mg | Freq: Once | INTRAMUSCULAR | Status: AC
  Administered 2017-04-03: 150 mg via INTRAMUSCULAR

## 2017-04-03 NOTE — Progress Notes (Signed)
Patient ID: Stacy Pineda, female   DOB: [birth year], 36 y.o.   MRN: [id number] Pt presents for depo for irregular menses. Pt states she continues to feel cramping but no vaginal bleeding.

## 2017-05-06 DIAGNOSIS — R109 Unspecified abdominal pain: Secondary | ICD-10-CM | POA: Diagnosis not present

## 2017-05-06 DIAGNOSIS — Z79899 Other long term (current) drug therapy: Secondary | ICD-10-CM | POA: Diagnosis not present

## 2017-05-06 DIAGNOSIS — M549 Dorsalgia, unspecified: Secondary | ICD-10-CM | POA: Diagnosis not present

## 2017-05-06 DIAGNOSIS — R6882 Decreased libido: Secondary | ICD-10-CM | POA: Diagnosis not present

## 2017-05-06 DIAGNOSIS — R631 Polydipsia: Secondary | ICD-10-CM | POA: Diagnosis not present

## 2017-05-06 DIAGNOSIS — K861 Other chronic pancreatitis: Secondary | ICD-10-CM | POA: Diagnosis not present

## 2017-05-06 DIAGNOSIS — F329 Major depressive disorder, single episode, unspecified: Secondary | ICD-10-CM | POA: Diagnosis not present

## 2017-05-06 DIAGNOSIS — R61 Generalized hyperhidrosis: Secondary | ICD-10-CM | POA: Diagnosis not present

## 2017-05-06 DIAGNOSIS — G8929 Other chronic pain: Secondary | ICD-10-CM | POA: Diagnosis not present

## 2017-05-06 DIAGNOSIS — R350 Frequency of micturition: Secondary | ICD-10-CM | POA: Diagnosis not present

## 2017-05-06 DIAGNOSIS — F410 Panic disorder [episodic paroxysmal anxiety] without agoraphobia: Secondary | ICD-10-CM | POA: Diagnosis not present

## 2017-05-06 DIAGNOSIS — Z885 Allergy status to narcotic agent status: Secondary | ICD-10-CM | POA: Diagnosis not present

## 2017-06-02 ENCOUNTER — Ambulatory Visit (HOSPITAL_COMMUNITY): Payer: Self-pay | Admitting: Psychiatry

## 2017-06-09 DIAGNOSIS — D509 Iron deficiency anemia, unspecified: Secondary | ICD-10-CM | POA: Diagnosis not present

## 2017-06-09 DIAGNOSIS — K861 Other chronic pancreatitis: Secondary | ICD-10-CM | POA: Diagnosis not present

## 2017-06-09 DIAGNOSIS — Z124 Encounter for screening for malignant neoplasm of cervix: Secondary | ICD-10-CM | POA: Diagnosis not present

## 2017-06-09 DIAGNOSIS — Z0001 Encounter for general adult medical examination with abnormal findings: Secondary | ICD-10-CM | POA: Diagnosis not present

## 2017-06-09 DIAGNOSIS — R87612 Low grade squamous intraepithelial lesion on cytologic smear of cervix (LGSIL): Secondary | ICD-10-CM | POA: Diagnosis not present

## 2017-06-09 DIAGNOSIS — R1084 Generalized abdominal pain: Secondary | ICD-10-CM | POA: Diagnosis not present

## 2017-06-12 ENCOUNTER — Ambulatory Visit: Payer: Self-pay

## 2017-06-22 ENCOUNTER — Other Ambulatory Visit: Payer: Self-pay | Admitting: *Deleted

## 2017-06-22 ENCOUNTER — Telehealth: Payer: Self-pay | Admitting: Obstetrics and Gynecology

## 2017-06-22 ENCOUNTER — Ambulatory Visit: Payer: Self-pay

## 2017-06-22 MED ORDER — MEDROXYPROGESTERONE ACETATE 150 MG/ML IM SUSY: 1.0000 mL | PREFILLED_SYRINGE | INTRAMUSCULAR | 3 refills | Status: DC

## 2017-06-22 NOTE — Telephone Encounter (Signed)
Patient has lost or misplaced her injection - she may have thrown it away and now she's not sure what to do   Please call asap

## 2017-08-03 DIAGNOSIS — R109 Unspecified abdominal pain: Secondary | ICD-10-CM | POA: Diagnosis not present

## 2017-08-03 DIAGNOSIS — G894 Chronic pain syndrome: Secondary | ICD-10-CM | POA: Diagnosis not present

## 2017-08-03 DIAGNOSIS — G8929 Other chronic pain: Secondary | ICD-10-CM | POA: Diagnosis not present

## 2017-08-25 ENCOUNTER — Ambulatory Visit (HOSPITAL_COMMUNITY): Payer: Self-pay | Admitting: Psychiatry

## 2017-08-31 ENCOUNTER — Ambulatory Visit (INDEPENDENT_AMBULATORY_CARE_PROVIDER_SITE_OTHER): Payer: Medicare Other | Admitting: Licensed Clinical Social Worker

## 2017-08-31 DIAGNOSIS — F4312 Post-traumatic stress disorder, chronic: Secondary | ICD-10-CM

## 2017-09-01 ENCOUNTER — Ambulatory Visit (HOSPITAL_COMMUNITY): Payer: Self-pay | Admitting: Psychiatry

## 2017-09-04 ENCOUNTER — Ambulatory Visit (INDEPENDENT_AMBULATORY_CARE_PROVIDER_SITE_OTHER): Payer: Medicare Other | Admitting: Psychiatry

## 2017-09-04 ENCOUNTER — Encounter (HOSPITAL_COMMUNITY): Payer: Self-pay | Admitting: Psychiatry

## 2017-09-04 DIAGNOSIS — F17290 Nicotine dependence, other tobacco product, uncomplicated: Secondary | ICD-10-CM

## 2017-09-04 DIAGNOSIS — Z79899 Other long term (current) drug therapy: Secondary | ICD-10-CM | POA: Diagnosis not present

## 2017-09-04 DIAGNOSIS — F4312 Post-traumatic stress disorder, chronic: Secondary | ICD-10-CM | POA: Diagnosis not present

## 2017-09-04 DIAGNOSIS — F4001 Agoraphobia with panic disorder: Secondary | ICD-10-CM | POA: Diagnosis not present

## 2017-09-04 DIAGNOSIS — Z818 Family history of other mental and behavioral disorders: Secondary | ICD-10-CM

## 2017-09-04 DIAGNOSIS — F5104 Psychophysiologic insomnia: Secondary | ICD-10-CM

## 2017-09-04 MED ORDER — PAROXETINE HCL 40 MG PO TABS: 40.0000 mg | ORAL_TABLET | Freq: Every day | ORAL | 1 refills | Status: DC

## 2017-09-04 MED ORDER — HYDROXYZINE PAMOATE 25 MG PO CAPS: 25.0000 mg | ORAL_CAPSULE | Freq: Three times a day (TID) | ORAL | 1 refills | Status: DC | PRN

## 2017-09-04 MED ORDER — ALPRAZOLAM 0.5 MG PO TABS: 1.0000 mg | ORAL_TABLET | Freq: Two times a day (BID) | ORAL | 1 refills | Status: DC | PRN
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
## 2017-09-07 NOTE — Progress Notes (Signed)
   THERAPIST PROGRESS NOTE  Session Time: 65min  Participation Level: Active  Behavioral Response: Neat and Well GroomedAlertAnxious  Type of Therapy: Individual Therapy  Treatment Goals addressed: Anxiety and Coping  Interventions: CBT, Motivational Interviewing and Solution Focused  Summary: Stacy Pineda is a 37 y.o. female who presents with continued symptoms of her diagnosis.  Patient reports that she has been "[place]."  She reports her struggles with attending college and having anxiety/trauma. She reports that she had to drop out one semester due to pancreatitis flare up.  She reports that she feels isolated and that she has no one.  SHe reports that her parents want her to suck it up and deal with it.  Patient currently struggling with boundaries and setting limits.    Suicidal/Homicidal: No  Therapist Response: LCSW provided Patient with ongoing emotional support and encouragement.  Normalized her feelings.  Commended Patient on her progress and reinforced the importance of client staying focused on her own strengths and resources and resiliency. Processed various strategies for dealing with stressors.    Plan: Return again in 2 weeks.  Diagnosis: Axis I: Anxiety Disorder NOS and Post Traumatic Stress Disorder    Axis II: No diagnosis    Lubertha South, LCSW 09/01/2017

## 2017-09-08 ENCOUNTER — Other Ambulatory Visit (HOSPITAL_COMMUNITY): Payer: Self-pay

## 2017-09-08 DIAGNOSIS — F5104 Psychophysiologic insomnia: Secondary | ICD-10-CM

## 2017-09-08 DIAGNOSIS — F4001 Agoraphobia with panic disorder: Secondary | ICD-10-CM

## 2017-09-08 DIAGNOSIS — F4312 Post-traumatic stress disorder, chronic: Secondary | ICD-10-CM

## 2017-09-08 MED ORDER — HYDROXYZINE PAMOATE 25 MG PO CAPS: ORAL_CAPSULE | ORAL | 1 refills | Status: DC

## 2017-09-14 ENCOUNTER — Ambulatory Visit: Payer: Medicare Other | Admitting: Licensed Clinical Social Worker

## 2017-09-28 ENCOUNTER — Ambulatory Visit (INDEPENDENT_AMBULATORY_CARE_PROVIDER_SITE_OTHER): Payer: Medicare Other | Admitting: Licensed Clinical Social Worker

## 2017-09-28 DIAGNOSIS — F331 Major depressive disorder, recurrent, moderate: Secondary | ICD-10-CM

## 2017-09-28 DIAGNOSIS — F4312 Post-traumatic stress disorder, chronic: Secondary | ICD-10-CM

## 2017-09-28 DIAGNOSIS — F411 Generalized anxiety disorder: Secondary | ICD-10-CM

## 2017-10-02 ENCOUNTER — Ambulatory Visit (INDEPENDENT_AMBULATORY_CARE_PROVIDER_SITE_OTHER): Payer: Medicare Other | Admitting: Certified Nurse Midwife

## 2017-10-02 ENCOUNTER — Encounter: Payer: Self-pay | Admitting: Certified Nurse Midwife

## 2017-10-02 VITALS — BP 117/77 | HR 77 | Ht 72.0 in | Wt 160.4 lb

## 2017-10-02 DIAGNOSIS — Z3042 Encounter for surveillance of injectable contraceptive: Secondary | ICD-10-CM

## 2017-10-02 MED ORDER — MEDROXYPROGESTERONE ACETATE 150 MG/ML IM SUSP: 150.0000 mg | INTRAMUSCULAR | 3 refills | Status: DC

## 2017-10-02 NOTE — Patient Instructions (Signed)

## 2017-10-05 ENCOUNTER — Ambulatory Visit: Payer: Self-pay | Admitting: Certified Nurse Midwife

## 2017-10-13 NOTE — Progress Notes (Signed)
GYN ENCOUNTER NOTE  Subjective:       Stacy Pineda is a 37 y.o. [id number] female here to discuss restarting Depo-Provera injections for irregular heavy periods. Last dose: 04/03/2017.   History of depo-provera use for the last three (3) years. Endorses celibacy for the last four (4) years.   Denies difficulty breathing or respiratory distress, chest pain, abdominal pain, excessive vaginal bleeding, dysuria, and leg pain or swelling.   Gynecologic History  No LMP recorded (lmp unknown). Patient has had an injection.  Contraception: abstinence  Last Pap: 03/30/2015. Results were: Negative/Negative  Obstetric History  OB History  Gravida Para Term Preterm AB Living  1       1    SAB TAB Ectopic Multiple Live Births  1            # Outcome Date GA Lbr Len/2nd Weight Sex Delivery Anes PTL Lv  1 SAB 1999              Past Medical History:  Diagnosis Date  . Anxiety   . Asthma   . Chronic headaches   . Depression   . Pancreatitis   . PTSD (post-traumatic stress disorder)     Past Surgical History:  Procedure Laterality Date  . ABDOMINAL SURGERY    . CHOLECYSTECTOMY    . DECUBITUS ULCER EXCISION    . DILATION AND CURETTAGE OF UTERUS    . GASTRIC BYPASS      Current Outpatient Medications on File Prior to Visit  Medication Sig Dispense Refill  . albuterol (VENTOLIN HFA) 108 (90 Base) MCG/ACT inhaler     . ALPRAZolam (XANAX) 0.5 MG tablet Take 2 tablets (1 mg total) by mouth 2 (two) times daily as needed for anxiety. 90 tablet 1  . ASMANEX HFA 100 MCG/ACT AERO     . CREON 36000 UNITS CPEP capsule     . ondansetron (ZOFRAN) 4 MG tablet Take 4 mg by mouth.    Marland Kitchen PARoxetine (PAXIL) 40 MG tablet Take 1 tablet (40 mg total) by mouth daily. 90 tablet 1  . pregabalin (LYRICA) 200 MG capsule Take 200 mg by mouth 3 (three) times daily.     . traMADol (ULTRAM) 50 MG tablet Take 50 mg by mouth every 6 (six) hours as needed.     No current facility-administered medications on file  prior to visit.     Allergies  Allergen Reactions  . Fentanyl Itching  . Morphine And Related Itching    Itchy.   . Vicodin [Hydrocodone-Acetaminophen] Itching    Social History   Socioeconomic History  . Marital status: Single    Spouse name: Not on file  . Number of children: Not on file  . Years of education: Not on file  . Highest education level: Not on file  Social Needs  . Financial resource strain: Not on file  . Food insecurity - worry: Not on file  . Food insecurity - inability: Not on file  . Transportation needs - medical: Not on file  . Transportation needs - non-medical: Not on file  Occupational History  . Not on file  Tobacco Use  . Smoking status: Current Some Day Smoker    Packs/day: 0.25    Types: Cigars, E-cigarettes, Cigarettes    Start date: 03/26/2005  . Smokeless tobacco: Never Used  Substance and Sexual Activity  . Alcohol use: No    Alcohol/week: 0.0 oz  . Drug use: Yes    Frequency: 3.0 times per week  Types: Marijuana    Comment: MJ use weekly  . Sexual activity: Not Currently    Birth control/protection: Injection  Other Topics Concern  . Not on file  Social History Narrative  . Not on file    Family History  Problem Relation Age of Onset  . Graves' disease Mother   . Hypertension Mother   . Hyperlipidemia Mother   . Anxiety disorder Father   . Prostate cancer Father   . Depression Brother   . Anxiety disorder Brother   . Anxiety disorder Maternal Aunt   . Depression Maternal Aunt   . Anxiety disorder Paternal Aunt   . Depression Paternal Aunt   . Anxiety disorder Maternal Uncle   . Depression Maternal Uncle   . Anxiety disorder Paternal Uncle   . Depression Paternal Uncle   . Anxiety disorder Maternal Grandfather   . Depression Maternal Grandfather   . Diabetes Maternal Grandfather   . Anxiety disorder Maternal Grandmother   . Depression Maternal Grandmother   . Breast cancer Maternal Grandmother   . Ovarian cancer  Maternal Grandmother   . Diabetes Maternal Grandmother   . Colon cancer Neg Hx     The following portions of the patient's history were reviewed and updated as appropriate: allergies, current medications, past family history, past medical history, past social history, past surgical history and problem list.  Review of Systems   Review of Systems - Negative except as noted above. History obtained from the patient.   Objective:   BP 117/77   Pulse 77   Ht 6' (1.829 m)   Wt 160 lb 6.4 oz (72.8 kg)   LMP  (LMP Unknown)   BMI 21.75 kg/m    Alert and oriented x 4, no apparent distress.   Physical exam: not indicated.   UPT negative.   Assessment:   1. Depo-Provera contraceptive status  - DG Bone Density; Future   Plan:   Rx: Medroxyprogesterone, see orders.   Encouraged Bone Scan for prolonged Depo-Provera use, see orders.   Reviewed red flag symptoms and when to call.   RTC for injection ASAP.    Diona Fanti, CNM [facility], [facility]

## 2017-10-14 ENCOUNTER — Ambulatory Visit (INDEPENDENT_AMBULATORY_CARE_PROVIDER_SITE_OTHER): Payer: Medicare Other | Admitting: Licensed Clinical Social Worker

## 2017-10-14 DIAGNOSIS — F411 Generalized anxiety disorder: Secondary | ICD-10-CM | POA: Diagnosis not present

## 2017-10-14 DIAGNOSIS — F4312 Post-traumatic stress disorder, chronic: Secondary | ICD-10-CM | POA: Diagnosis not present

## 2017-10-14 NOTE — Progress Notes (Signed)
  THERAPIST PROGRESS NOTE   Date of Service:   10/14/17  Session Time:  1 hour  Patient:   Stacy Pineda   DOB:   1980-12-01  MR Number:  [id number]  Location:  [facility] [address] [place] [address] Dept: [phone]            Provider/Observer:  Lubertha South Counselor  Risk of Suicide/Violence: virtually non-existent   Diagnosis:    Chronic post-traumatic stress disorder (PTSD)  GAD (generalized anxiety disorder)  Type of Therapy: Individual Therapy  Treatment Goals addressed: Anxiety and Coping  Participation Level: Active   Interventions: CBT and Motivational Interviewing   Behavioral Response: CasualAlertOptimistic   Summary:   Patient discussed her current mood and stressors.  Patient was able to narrate the unrealistic expectations that her parents has established/set for her. Prompted Patient for her thought's regarding her efforts to stabilize her mental health symptoms over the past week.  Encouraged Patient to build a larger support network. Engaged Patient in the development of effective relaxation techniques to better manage overwhelming feelings and decrease adverse reactive behaviors.  Provided psychoeducation on focused breathing and progressive muscle relaxation via "Topic 8a: Relaxation Techniques" (IMR: Practitioner Guides and Handouts, p.285). Modeled the aforementioned relaxation skills for Patient before engaging her in a cognitive rehearsal of the activities to ensure independent ease of use, comfort, and effectiveness.  Assessed Patient's progress toward goal attainment   Plan: Stacy Pineda will use mood tracker and meditation to reduce symptoms.  Patient will purge household items and emotional baggage.   Return again in 2 weeks.

## 2017-10-15 ENCOUNTER — Telehealth: Payer: Self-pay

## 2017-10-15 NOTE — Telephone Encounter (Signed)
Pt called at 5 wanting to be seen for broken/spranged ankle per beth I told pt to go to er

## 2017-10-16 ENCOUNTER — Encounter (HOSPITAL_COMMUNITY): Payer: Self-pay | Admitting: Psychiatry

## 2017-10-16 ENCOUNTER — Encounter: Payer: Self-pay | Admitting: Certified Nurse Midwife

## 2017-10-16 ENCOUNTER — Ambulatory Visit (INDEPENDENT_AMBULATORY_CARE_PROVIDER_SITE_OTHER): Payer: Medicare Other | Admitting: Psychiatry

## 2017-10-16 ENCOUNTER — Ambulatory Visit (INDEPENDENT_AMBULATORY_CARE_PROVIDER_SITE_OTHER): Payer: Self-pay | Admitting: Certified Nurse Midwife

## 2017-10-16 ENCOUNTER — Ambulatory Visit: Payer: Self-pay | Admitting: Certified Nurse Midwife

## 2017-10-16 VITALS — BP 106/78 | HR 90 | Ht 72.0 in | Wt 157.5 lb

## 2017-10-16 VITALS — BP 122/70 | HR 87 | Ht 72.0 in | Wt 158.0 lb

## 2017-10-16 DIAGNOSIS — Z818 Family history of other mental and behavioral disorders: Secondary | ICD-10-CM | POA: Diagnosis not present

## 2017-10-16 DIAGNOSIS — F411 Generalized anxiety disorder: Secondary | ICD-10-CM | POA: Diagnosis not present

## 2017-10-16 DIAGNOSIS — Z3042 Encounter for surveillance of injectable contraceptive: Secondary | ICD-10-CM

## 2017-10-16 DIAGNOSIS — F4312 Post-traumatic stress disorder, chronic: Secondary | ICD-10-CM | POA: Diagnosis not present

## 2017-10-16 DIAGNOSIS — F4001 Agoraphobia with panic disorder: Secondary | ICD-10-CM | POA: Diagnosis not present

## 2017-10-16 DIAGNOSIS — F5104 Psychophysiologic insomnia: Secondary | ICD-10-CM | POA: Diagnosis not present

## 2017-10-16 DIAGNOSIS — F1290 Cannabis use, unspecified, uncomplicated: Secondary | ICD-10-CM | POA: Diagnosis not present

## 2017-10-16 MED ORDER — PROPRANOLOL HCL 10 MG PO TABS: 10.0000 mg | ORAL_TABLET | Freq: Three times a day (TID) | ORAL | 1 refills | Status: DC

## 2017-10-16 MED ORDER — MEDROXYPROGESTERONE ACETATE 150 MG/ML IM SUSP
150.0000 mg | Freq: Once | INTRAMUSCULAR | Status: AC
  Administered 2017-10-16: 150 mg via INTRAMUSCULAR

## 2017-10-16 MED ORDER — HYDROXYZINE PAMOATE 50 MG PO CAPS: 50.0000 mg | ORAL_CAPSULE | Freq: Every day | ORAL | 2 refills | Status: DC

## 2017-10-16 NOTE — Patient Instructions (Signed)

## 2017-10-16 NOTE — Progress Notes (Signed)
[facility] MD/PA/NP OP Progress Note  10/16/2017 10:04 AM Stacy Pineda  MRN:  [id number]  Chief Complaint: med management  HPI: Stacy Pineda reports some breakthrough symptoms of anxiety and panic as her Paxil is taking effect.  She does not feel as depressed and feels more hopeful.  Spent time discussing some of her external stressors which she attributes much of her anxiety towards.  She is working with her therapist and that they have suggested she work on Lawyer which I agree with.  She does not present with any acute agitation or safety concerns.  She is taking Paxil as prescribed and uses Xanax on an as-needed basis.  Has had some trouble getting Vistaril so we will send in the 50 mg dose.  We discussed the use of propranolol for breakthrough physical symptoms of anxiety and panic, along with the risks of hypotension.  Visit Diagnosis:    ICD-10-CM   1. Chronic post-traumatic stress disorder (PTSD) F43.12   2. GAD (generalized anxiety disorder) F41.1   3. Panic disorder with agoraphobia F40.01   4. Psychophysiological insomnia F51.04     Past Psychiatric History: See intake H&P for full details. Reviewed, with no updates at this time.   Past Medical History:  Past Medical History:  Diagnosis Date  . Anxiety   . Asthma   . Chronic headaches   . Depression   . Pancreatitis   . PTSD (post-traumatic stress disorder)     Past Surgical History:  Procedure Laterality Date  . ABDOMINAL SURGERY    . CHOLECYSTECTOMY    . DECUBITUS ULCER EXCISION    . DILATION AND CURETTAGE OF UTERUS    . GASTRIC BYPASS      Family Psychiatric History: See intake H&P for full details. Reviewed, with no updates at this time.   Family History:  Family History  Problem Relation Age of Onset  . Graves' disease Mother   . Hypertension Mother   . Hyperlipidemia Mother   . Anxiety disorder Father   . Prostate cancer Father   . Depression Brother   . Anxiety disorder Brother   . Anxiety  disorder Maternal Aunt   . Depression Maternal Aunt   . Anxiety disorder Paternal Aunt   . Depression Paternal Aunt   . Anxiety disorder Maternal Uncle   . Depression Maternal Uncle   . Anxiety disorder Paternal Uncle   . Depression Paternal Uncle   . Anxiety disorder Maternal Grandfather   . Depression Maternal Grandfather   . Diabetes Maternal Grandfather   . Anxiety disorder Maternal Grandmother   . Depression Maternal Grandmother   . Breast cancer Maternal Grandmother   . Ovarian cancer Maternal Grandmother   . Diabetes Maternal Grandmother   . Colon cancer Neg Hx     Social History:  Social History   Socioeconomic History  . Marital status: Single    Spouse name: Not on file  . Number of children: Not on file  . Years of education: Not on file  . Highest education level: Not on file  Social Needs  . Financial resource strain: Not on file  . Food insecurity - worry: Not on file  . Food insecurity - inability: Not on file  . Transportation needs - medical: Not on file  . Transportation needs - non-medical: Not on file  Occupational History  . Not on file  Tobacco Use  . Smoking status: Current Some Day Smoker    Packs/day: 0.25    Types: Cigars, E-cigarettes,  Cigarettes    Start date: 03/26/2005  . Smokeless tobacco: Never Used  Substance and Sexual Activity  . Alcohol use: No    Alcohol/week: 0.0 oz  . Drug use: Yes    Frequency: 3.0 times per week    Types: Marijuana    Comment: MJ use weekly  . Sexual activity: Not Currently    Birth control/protection: Injection  Other Topics Concern  . Not on file  Social History Narrative  . Not on file    Allergies:  Allergies  Allergen Reactions  . Fentanyl Itching  . Morphine And Related Itching    Itchy.   . Vicodin [Hydrocodone-Acetaminophen] Itching    Metabolic Disorder Labs: No results found for: HGBA1C, MPG No results found for: PROLACTIN No results found for: CHOL, TRIG, HDL, CHOLHDL, VLDL,  LDLCALC No results found for: TSH  Therapeutic Level Labs: No results found for: LITHIUM No results found for: VALPROATE No components found for:  CBMZ  Current Medications: Current Outpatient Medications  Medication Sig Dispense Refill  . albuterol (VENTOLIN HFA) 108 (90 Base) MCG/ACT inhaler     . ALPRAZolam (XANAX) 0.5 MG tablet Take 2 tablets (1 mg total) by mouth 2 (two) times daily as needed for anxiety. 90 tablet 1  . ASMANEX HFA 100 MCG/ACT AERO     . CREON 36000 UNITS CPEP capsule     . hydrOXYzine (VISTARIL) 50 MG capsule Take 1 capsule (50 mg total) by mouth at bedtime. 30 capsule 2  . medroxyPROGESTERone (DEPO-PROVERA) 150 MG/ML injection Inject 1 mL (150 mg total) into the muscle every 3 (three) months. 1 mL 3  . ondansetron (ZOFRAN) 4 MG tablet Take 4 mg by mouth.    Marland Kitchen PARoxetine (PAXIL) 40 MG tablet Take 1 tablet (40 mg total) by mouth daily. 90 tablet 1  . pregabalin (LYRICA) 200 MG capsule Take 200 mg by mouth 3 (three) times daily.     . propranolol (INDERAL) 10 MG tablet Take 1 tablet (10 mg total) by mouth 3 (three) times daily. For physical symptoms of anxiety/panic 90 tablet 1  . traMADol (ULTRAM) 50 MG tablet Take 50 mg by mouth every 6 (six) hours as needed.     No current facility-administered medications for this visit.      Musculoskeletal: Strength & Muscle Tone: within normal limits Gait & Station: normal Patient leans: N/A  Psychiatric Specialty Exam: ROS  Blood pressure 122/70, pulse 87, height 6' (1.829 m), weight 158 lb (71.7 kg).Body mass index is 21.43 kg/m.  General Appearance: Casual and Well Groomed  Eye Contact:  Good  Speech:  Clear and Coherent and Normal Rate  Volume:  Normal  Mood:  Anxious  Affect:  Congruent  Thought Process:  Coherent, Goal Directed and Descriptions of Associations: Intact  Orientation:  Full (Time, Place, and Person)  Thought Content: Logical   Suicidal Thoughts:  No  Homicidal Thoughts:  No  Memory:   Immediate;   Good  Judgement:  Fair  Insight:  Present and Shallow  Psychomotor Activity:  Normal  Concentration:  Concentration: Good  Recall:  Good  Fund of Knowledge: Good  Language: Good  Akathisia:  Negative  Handed:  Right  AIMS (if indicated): not done  Assets:  Communication Skills Desire for Improvement Financial Resources/Insurance Housing Physical Health Transportation Vocational/Educational  ADL's:  Intact  Cognition: WNL  Sleep:  Fair   Screenings: MDI     Office Visit from 03/27/2015 in [place]  Total Score (  max 50)  44    PHQ2-9     Office Visit from 12/28/2015 in [place]  PHQ-2 Total Score  0       Assessment and Plan: Stacy Pineda presents with interval improvement of her depressive symptoms after restarting Paxil.  She has some breakthrough anxiety that I anticipate will improve as SSRI comes to full effect in the coming weeks.  We will provide propranolol for breakthrough anxiety episodes throughout the day.  She continues to take Xanax and would like to continue on Vistaril nightly for sleep.  We will follow-up in May as scheduled, and she continues in individual therapy every 2 weeks.  1. Chronic post-traumatic stress disorder (PTSD)   2. GAD (generalized anxiety disorder)   3. Panic disorder with agoraphobia   4. Psychophysiological insomnia     Status of current problems: gradually improving  Labs Ordered: No orders of the defined types were placed in this encounter.   Labs Reviewed: n/a  Collateral Obtained/Records Reviewed: n/a  Plan:  Paxil 40 mg daily Xanax 0.5 mg bid prn Vistaril 50 mg qhs Propranolol 10 mg tid prn  I spent 20 minutes with the patient in direct face-to-face clinical care.  Greater than 50% of this time was spent in counseling and coordination of care with the patient.    Aundra Dubin, MD 10/16/2017, 10:04 AM

## 2017-10-20 ENCOUNTER — Encounter: Payer: Self-pay | Admitting: Certified Nurse Midwife

## 2017-10-20 NOTE — Progress Notes (Signed)
  BP 106/78   Pulse 90   Ht 6' (1.829 m)   Wt 157 lb 8 oz (71.4 kg)   LMP 10/02/2017 (Approximate)   BMI 21.36 kg/m   Date last pap: 03/30/2015. Last Depo-Provera: 04/03/2017. Side Effects if any: N/A. Serum HCG indicated? No. Depo-Provera 150 mg IM given by: C. Sabra Heck, CMA.  Next appointment due 12/25/2017.  I have reviewed the record and concur with patient management and plan.   Diona Fanti, CNM [facility], [facility]

## 2017-10-28 ENCOUNTER — Ambulatory Visit
Admission: RE | Admit: 2017-10-28 | Discharge: 2017-10-28 | Disposition: A | Discharge status: Home / Self Care | Payer: Medicare Other | Source: Ambulatory Visit | Attending: Certified Nurse Midwife | Admitting: Certified Nurse Midwife

## 2017-10-28 DIAGNOSIS — Z780 Asymptomatic menopausal state: Secondary | ICD-10-CM | POA: Diagnosis not present

## 2017-10-28 DIAGNOSIS — Z3042 Encounter for surveillance of injectable contraceptive: Secondary | ICD-10-CM | POA: Insufficient documentation

## 2017-10-28 DIAGNOSIS — Z13820 Encounter for screening for osteoporosis: Secondary | ICD-10-CM | POA: Diagnosis not present

## 2017-11-02 NOTE — Progress Notes (Signed)
  THERAPIST PROGRESS NOTE   Date of Service:   09/28/2017  Session Time:  1 Hour  Patient:   Stacy Pineda   DOB:   08/29/1980  MR Number:  [id number]  Location:  [facility] [address] [place] [address] Dept: [phone]            Provider/Observer:  Lubertha South Counselor  Risk of Suicide/Violence: low   Diagnosis:    Chronic post-traumatic stress disorder (PTSD)  MDD (major depressive disorder), recurrent episode, moderate (HCC)  GAD (generalized anxiety disorder)  Type of Therapy: Individual Therapy  Treatment Goals addressed: Anxiety and Coping  Participation Level: Active    Behavioral Observation: Stacy Pineda  presents as a 37 y.o.-year-old  Interventions: CBT and Motivational Interviewing   Behavioral Response: CasualAlertAnxious   Summary: Discussed ct's sxs and current stressors, including concerns with peers, family relationships and interactions with mother.  Positive self talk techniques were taught to the Patient to assist in boosting her confidence and self image.  Role play was used to practice positive self talk techniques.  Education was provided on identifying, labeling and expressing feelings.  Writer assisted Patient in developing a list of positive affirmations for her.  Patient was asked to examine herself in a mirror for two minutes daily and record her responses in order to expand her acceptance of herself.    Plan: Stacy Pineda will continue to use coping skills.   Return again in 2weeks.

## 2017-11-09 ENCOUNTER — Ambulatory Visit: Payer: Medicare Other | Admitting: Licensed Clinical Social Worker

## 2017-11-26 DIAGNOSIS — G8929 Other chronic pain: Secondary | ICD-10-CM | POA: Diagnosis not present

## 2017-11-26 DIAGNOSIS — Z9884 Bariatric surgery status: Secondary | ICD-10-CM | POA: Diagnosis not present

## 2017-11-26 DIAGNOSIS — R109 Unspecified abdominal pain: Secondary | ICD-10-CM | POA: Diagnosis not present

## 2017-11-30 DIAGNOSIS — R1310 Dysphagia, unspecified: Secondary | ICD-10-CM | POA: Diagnosis not present

## 2017-11-30 DIAGNOSIS — Z79899 Other long term (current) drug therapy: Secondary | ICD-10-CM | POA: Diagnosis not present

## 2017-11-30 DIAGNOSIS — K259 Gastric ulcer, unspecified as acute or chronic, without hemorrhage or perforation: Secondary | ICD-10-CM | POA: Diagnosis not present

## 2017-11-30 DIAGNOSIS — D509 Iron deficiency anemia, unspecified: Secondary | ICD-10-CM | POA: Diagnosis not present

## 2017-11-30 DIAGNOSIS — Z87891 Personal history of nicotine dependence: Secondary | ICD-10-CM | POA: Diagnosis not present

## 2017-11-30 DIAGNOSIS — R1110 Vomiting, unspecified: Secondary | ICD-10-CM | POA: Diagnosis not present

## 2017-11-30 DIAGNOSIS — I10 Essential (primary) hypertension: Secondary | ICD-10-CM | POA: Diagnosis not present

## 2017-11-30 DIAGNOSIS — R1013 Epigastric pain: Secondary | ICD-10-CM | POA: Diagnosis not present

## 2017-11-30 DIAGNOSIS — Z9884 Bariatric surgery status: Secondary | ICD-10-CM | POA: Diagnosis not present

## 2017-11-30 DIAGNOSIS — K219 Gastro-esophageal reflux disease without esophagitis: Secondary | ICD-10-CM | POA: Diagnosis not present

## 2017-11-30 DIAGNOSIS — J189 Pneumonia, unspecified organism: Secondary | ICD-10-CM | POA: Diagnosis not present

## 2017-11-30 DIAGNOSIS — K313 Pylorospasm, not elsewhere classified: Secondary | ICD-10-CM | POA: Diagnosis not present

## 2017-11-30 DIAGNOSIS — R112 Nausea with vomiting, unspecified: Secondary | ICD-10-CM | POA: Diagnosis not present

## 2017-11-30 DIAGNOSIS — K859 Acute pancreatitis without necrosis or infection, unspecified: Secondary | ICD-10-CM | POA: Diagnosis not present

## 2017-11-30 DIAGNOSIS — R627 Adult failure to thrive: Secondary | ICD-10-CM | POA: Diagnosis not present

## 2017-11-30 DIAGNOSIS — K319 Disease of stomach and duodenum, unspecified: Secondary | ICD-10-CM | POA: Diagnosis not present

## 2017-11-30 DIAGNOSIS — Z86718 Personal history of other venous thrombosis and embolism: Secondary | ICD-10-CM | POA: Diagnosis not present

## 2017-11-30 DIAGNOSIS — J45909 Unspecified asthma, uncomplicated: Secondary | ICD-10-CM | POA: Diagnosis not present

## 2017-12-10 DIAGNOSIS — K529 Noninfective gastroenteritis and colitis, unspecified: Secondary | ICD-10-CM | POA: Diagnosis not present

## 2017-12-10 DIAGNOSIS — R109 Unspecified abdominal pain: Secondary | ICD-10-CM | POA: Diagnosis not present

## 2017-12-10 DIAGNOSIS — I10 Essential (primary) hypertension: Secondary | ICD-10-CM | POA: Diagnosis not present

## 2017-12-10 DIAGNOSIS — K859 Acute pancreatitis without necrosis or infection, unspecified: Secondary | ICD-10-CM | POA: Diagnosis not present

## 2017-12-10 DIAGNOSIS — F419 Anxiety disorder, unspecified: Secondary | ICD-10-CM | POA: Diagnosis not present

## 2017-12-10 DIAGNOSIS — Z9884 Bariatric surgery status: Secondary | ICD-10-CM | POA: Diagnosis not present

## 2017-12-10 DIAGNOSIS — Z87898 Personal history of other specified conditions: Secondary | ICD-10-CM | POA: Diagnosis not present

## 2017-12-10 DIAGNOSIS — G8929 Other chronic pain: Secondary | ICD-10-CM | POA: Diagnosis not present

## 2017-12-10 DIAGNOSIS — R1013 Epigastric pain: Secondary | ICD-10-CM | POA: Diagnosis not present

## 2017-12-10 DIAGNOSIS — K289 Gastrojejunal ulcer, unspecified as acute or chronic, without hemorrhage or perforation: Secondary | ICD-10-CM | POA: Diagnosis not present

## 2017-12-25 ENCOUNTER — Ambulatory Visit (INDEPENDENT_AMBULATORY_CARE_PROVIDER_SITE_OTHER): Payer: Medicare Other | Admitting: Certified Nurse Midwife

## 2017-12-25 VITALS — BP 122/81 | HR 90 | Ht 72.0 in | Wt 153.1 lb

## 2017-12-25 DIAGNOSIS — Z3042 Encounter for surveillance of injectable contraceptive: Secondary | ICD-10-CM

## 2017-12-25 MED ORDER — MEDROXYPROGESTERONE ACETATE 150 MG/ML IM SUSP: 150.0000 mg | INTRAMUSCULAR | 0 refills | Status: DC

## 2017-12-25 MED ORDER — MEDROXYPROGESTERONE ACETATE 150 MG/ML IM SUSP
150.0000 mg | Freq: Once | INTRAMUSCULAR | Status: AC
  Administered 2017-12-25: 150 mg via INTRAMUSCULAR

## 2017-12-25 NOTE — Progress Notes (Signed)
Date last pap:n/a Last Depo-Provera: .10/02/2017. Side Effects if any: none Serum HCG indicated? n/a Depo-Provera 150 mg IM given by: Melburn Hake Next appointment due - 03/03/2018 - pt receives depo q 10 weeks  Pt is to get AE and depo at [place]. Depo erx.   BP 122/81   Pulse 90   Ht 6' (1.829 m)   Wt 153 lb 1.6 oz (69.4 kg)   LMP  (LMP Unknown)   BMI 20.76 kg/m

## 2017-12-29 NOTE — Progress Notes (Signed)
I have reviewed the record and concur with patient management and plan of care.    Raia Amico Michelle Hiren Peplinski, CNM [facility] 

## 2018-01-06 ENCOUNTER — Encounter (HOSPITAL_COMMUNITY): Payer: Self-pay | Admitting: Psychiatry

## 2018-01-06 ENCOUNTER — Ambulatory Visit (INDEPENDENT_AMBULATORY_CARE_PROVIDER_SITE_OTHER): Payer: Medicare Other | Admitting: Psychiatry

## 2018-01-06 DIAGNOSIS — F4312 Post-traumatic stress disorder, chronic: Secondary | ICD-10-CM

## 2018-01-06 DIAGNOSIS — F4001 Agoraphobia with panic disorder: Secondary | ICD-10-CM

## 2018-01-06 DIAGNOSIS — K289 Gastrojejunal ulcer, unspecified as acute or chronic, without hemorrhage or perforation: Secondary | ICD-10-CM | POA: Diagnosis not present

## 2018-01-06 DIAGNOSIS — F17210 Nicotine dependence, cigarettes, uncomplicated: Secondary | ICD-10-CM

## 2018-01-06 DIAGNOSIS — F419 Anxiety disorder, unspecified: Secondary | ICD-10-CM

## 2018-01-06 MED ORDER — LORAZEPAM 1 MG PO TABS: 1.0000 mg | ORAL_TABLET | Freq: Three times a day (TID) | ORAL | 0 refills | Status: DC | PRN

## 2018-01-06 MED ORDER — PAROXETINE HCL 40 MG PO TABS: 40.0000 mg | ORAL_TABLET | Freq: Every day | ORAL | 1 refills | Status: DC

## 2018-01-06 MED ORDER — MIRTAZAPINE 15 MG PO TABS: 15.0000 mg | ORAL_TABLET | Freq: Every day | ORAL | 1 refills | Status: DC

## 2018-01-06 NOTE — Progress Notes (Signed)
BH MD/PA/NP OP Progress Note  01/06/2018 10:32 AM Stacy Pineda  MRN:  [id number]  Chief Complaint: medication management  HPI: Stacy Pineda has had substantial nausea and GI upset over the past 3-4 weeks, struggling with a ulcer, required an EGD, and this has affected her mood and anxiety levels.  She has taken a withdrawal from [facility] and has had some difficulty in being able to get approval of a medical withdrawal.  She is working on providing them with appropriate paperwork from her gastroenterologist.   She has required up to 3 doses of Xanax in a day because of the severe nausea interacting with her anxiety.  I spent time with her discussing a switch from Xanax to lorazepam up to 3 times a day given the antinausea properties of lorazepam.  We also discussed augmenting her current regimen with Remeron nightly instead of hydroxyzine, to further target mood, sleep, and nausea.   She does not have any suicidality or acute safety concerns.  She has good support from her aunt and her mother.  She reports that she feels pretty proud of herself that she has handled things fairly well.  We agreed to follow-up in 6-8 weeks to check on her progress.  Visit Diagnosis:    ICD-10-CM   1. Chronic post-traumatic stress disorder (PTSD) F43.12 PARoxetine (PAXIL) 40 MG tablet    LORazepam (ATIVAN) 1 MG tablet    mirtazapine (REMERON) 15 MG tablet  2. Panic disorder with agoraphobia F40.01 LORazepam (ATIVAN) 1 MG tablet    mirtazapine (REMERON) 15 MG tablet    Past Psychiatric History: See intake H&P for full details. Reviewed, with no updates at this time.   Past Medical History:  Past Medical History:  Diagnosis Date  . Anxiety   . Asthma   . Chronic headaches   . Depression   . Pancreatitis   . PTSD (post-traumatic stress disorder)     Past Surgical History:  Procedure Laterality Date  . ABDOMINAL SURGERY    . CHOLECYSTECTOMY    . DECUBITUS ULCER EXCISION    . DILATION AND CURETTAGE  OF UTERUS    . GASTRIC BYPASS      Family Psychiatric History: See intake H&P for full details. Reviewed, with no updates at this time.   Family History:  Family History  Problem Relation Age of Onset  . Graves' disease Mother   . Hypertension Mother   . Hyperlipidemia Mother   . Anxiety disorder Father   . Prostate cancer Father   . Depression Brother   . Anxiety disorder Brother   . Anxiety disorder Maternal Aunt   . Depression Maternal Aunt   . Anxiety disorder Paternal Aunt   . Depression Paternal Aunt   . Anxiety disorder Maternal Uncle   . Depression Maternal Uncle   . Anxiety disorder Paternal Uncle   . Depression Paternal Uncle   . Anxiety disorder Maternal Grandfather   . Depression Maternal Grandfather   . Diabetes Maternal Grandfather   . Anxiety disorder Maternal Grandmother   . Depression Maternal Grandmother   . Breast cancer Maternal Grandmother   . Ovarian cancer Maternal Grandmother   . Diabetes Maternal Grandmother   . Colon cancer Neg Hx     Social History:  Social History   Socioeconomic History  . Marital status: Single    Spouse name: Not on file  . Number of children: Not on file  . Years of education: Not on file  . Highest education level:  Not on file  Occupational History  . Not on file  Social Needs  . Financial resource strain: Not on file  . Food insecurity:    Worry: Not on file    Inability: Not on file  . Transportation needs:    Medical: Not on file    Non-medical: Not on file  Tobacco Use  . Smoking status: Current Some Day Smoker    Packs/day: 0.25    Types: Cigars, E-cigarettes, Cigarettes    Start date: 03/26/2005  . Smokeless tobacco: Never Used  Substance and Sexual Activity  . Alcohol use: No    Alcohol/week: 0.0 oz  . Drug use: Yes    Frequency: 3.0 times per week    Types: Marijuana    Comment: MJ use weekly  . Sexual activity: Not Currently    Birth control/protection: Injection  Lifestyle  . Physical  activity:    Days per week: Not on file    Minutes per session: Not on file  . Stress: Not on file  Relationships  . Social connections:    Talks on phone: Not on file    Gets together: Not on file    Attends religious service: Not on file    Active member of club or organization: Not on file    Attends meetings of clubs or organizations: Not on file    Relationship status: Not on file  Other Topics Concern  . Not on file  Social History Narrative  . Not on file    Allergies:  Allergies  Allergen Reactions  . Fentanyl Itching  . Morphine And Related Itching    Itchy.   . Vicodin [Hydrocodone-Acetaminophen] Itching    Metabolic Disorder Labs: No results found for: HGBA1C, MPG No results found for: PROLACTIN No results found for: CHOL, TRIG, HDL, CHOLHDL, VLDL, LDLCALC No results found for: TSH  Therapeutic Level Labs: No results found for: LITHIUM No results found for: VALPROATE No components found for:  CBMZ  Current Medications: Current Outpatient Medications  Medication Sig Dispense Refill  . albuterol (VENTOLIN HFA) 108 (90 Base) MCG/ACT inhaler     . ASMANEX HFA 100 MCG/ACT AERO     . CREON 36000 UNITS CPEP capsule     . medroxyPROGESTERone (DEPO-PROVERA) 150 MG/ML injection Inject 1 mL (150 mg total) into the muscle every 3 (three) months. 1 mL 0  . PARoxetine (PAXIL) 40 MG tablet Take 1 tablet (40 mg total) by mouth daily. 90 tablet 1  . pregabalin (LYRICA) 200 MG capsule Take 200 mg by mouth 3 (three) times daily.     . promethazine (PHENERGAN) 6.25 MG/5ML syrup Take by mouth every 6 (six) hours as needed for nausea or vomiting.    . propranolol (INDERAL) 10 MG tablet Take 1 tablet (10 mg total) by mouth 3 (three) times daily. For physical symptoms of anxiety/panic 90 tablet 1  . LORazepam (ATIVAN) 1 MG tablet Take 1 tablet (1 mg total) by mouth every 8 (eight) hours as needed for anxiety (nausea). 90 tablet 0  . mirtazapine (REMERON) 15 MG tablet Take 1  tablet (15 mg total) by mouth at bedtime. 90 tablet 1   No current facility-administered medications for this visit.      Musculoskeletal: Strength & Muscle Tone: within normal limits Gait & Station: normal Patient leans: N/A  Psychiatric Specialty Exam: ROS  Blood pressure 110/68, pulse 72, height 6' (1.829 m), weight 154 lb (69.9 kg).Body mass index is 20.89 kg/m.  General Appearance: Casual  and Fairly Groomed  Eye Contact:  Good  Speech:  Clear and Coherent and Normal Rate  Volume:  Normal  Mood:  Euthymic  Affect:  Congruent  Thought Process:  Coherent and Descriptions of Associations: Intact  Orientation:  Full (Time, Place, and Person)  Thought Content: Logical   Suicidal Thoughts:  No  Homicidal Thoughts:  No  Memory:  Immediate;   Fair  Judgement:  Fair  Insight:  Fair  Psychomotor Activity:  Normal  Concentration:  Attention Span: Good  Recall:  Good  Fund of Knowledge: Good  Language: Good  Akathisia:  Negative  Handed:  Right  AIMS (if indicated): not done  Assets:  Communication Skills Desire for Improvement Financial Resources/Insurance Housing  ADL's:  Intact  Cognition: WNL  Sleep:  Good   Screenings: MDI     Office Visit from 03/27/2015 in [place]  Total Score (max 50)  44    PHQ2-9     Office Visit from 12/28/2015 in [place]  PHQ-2 Total Score  0       Assessment and Plan: Stacy Pineda presents with a flare in anxiety and panic in the context of severe nausea and recurrence of ulcer.  She has been coordinating care with her gastroenterologist, and required a recent EGD.  She is in the process of requesting medical withdrawal from [facility].  She has been able to cope better with this than similar past medical issues.  She feels like she is using coping strategies, breathing exercises, and thinking through things better.  She uses propranolol periodically if she is having some  anxiety particularly social anxiety.  She continues on Paxil, but has required use of Xanax up to 3 times, due to severe anxiety associated with nausea.  We agreed to proceed as below to target her anxiety and nausea symptoms with use of lorazepam and Remeron.  1. Chronic post-traumatic stress disorder (PTSD)   2. Panic disorder with agoraphobia     Status of current problems: gradually worsening  Labs Ordered: No orders of the defined types were placed in this encounter.   Labs Reviewed: n/a  Collateral Obtained/Records Reviewed: n/a  Plan:  Continue paxil 40 mg, Initiate Lorazepam 1 mg up to 3 times daily Discontinue Xanax Initiate Remeron 15 mg nightly Propranolol 10 mg tid prn  Discontinue Vistaril rtc 6 weeks  Aundra Dubin, MD 01/06/2018, 10:32 AM

## 2018-01-15 DIAGNOSIS — G894 Chronic pain syndrome: Secondary | ICD-10-CM | POA: Diagnosis not present

## 2018-01-15 DIAGNOSIS — F419 Anxiety disorder, unspecified: Secondary | ICD-10-CM | POA: Diagnosis not present

## 2018-01-15 DIAGNOSIS — Z79899 Other long term (current) drug therapy: Secondary | ICD-10-CM | POA: Diagnosis not present

## 2018-01-15 DIAGNOSIS — R112 Nausea with vomiting, unspecified: Secondary | ICD-10-CM | POA: Diagnosis not present

## 2018-01-15 DIAGNOSIS — K219 Gastro-esophageal reflux disease without esophagitis: Secondary | ICD-10-CM | POA: Diagnosis not present

## 2018-01-15 DIAGNOSIS — D509 Iron deficiency anemia, unspecified: Secondary | ICD-10-CM | POA: Diagnosis not present

## 2018-01-15 DIAGNOSIS — E876 Hypokalemia: Secondary | ICD-10-CM | POA: Diagnosis not present

## 2018-01-15 DIAGNOSIS — K269 Duodenal ulcer, unspecified as acute or chronic, without hemorrhage or perforation: Secondary | ICD-10-CM | POA: Diagnosis not present

## 2018-01-15 DIAGNOSIS — R197 Diarrhea, unspecified: Secondary | ICD-10-CM | POA: Diagnosis not present

## 2018-01-15 DIAGNOSIS — Z9049 Acquired absence of other specified parts of digestive tract: Secondary | ICD-10-CM | POA: Diagnosis not present

## 2018-01-15 DIAGNOSIS — K859 Acute pancreatitis without necrosis or infection, unspecified: Secondary | ICD-10-CM | POA: Diagnosis not present

## 2018-01-15 DIAGNOSIS — G8929 Other chronic pain: Secondary | ICD-10-CM | POA: Diagnosis not present

## 2018-01-15 DIAGNOSIS — R109 Unspecified abdominal pain: Secondary | ICD-10-CM | POA: Diagnosis not present

## 2018-01-15 DIAGNOSIS — K861 Other chronic pancreatitis: Secondary | ICD-10-CM | POA: Diagnosis not present

## 2018-01-15 DIAGNOSIS — Z86718 Personal history of other venous thrombosis and embolism: Secondary | ICD-10-CM | POA: Diagnosis not present

## 2018-01-15 DIAGNOSIS — R51 Headache: Secondary | ICD-10-CM | POA: Diagnosis not present

## 2018-01-15 DIAGNOSIS — Z9884 Bariatric surgery status: Secondary | ICD-10-CM | POA: Diagnosis not present

## 2018-01-16 DIAGNOSIS — R51 Headache: Secondary | ICD-10-CM | POA: Diagnosis not present

## 2018-01-16 DIAGNOSIS — R197 Diarrhea, unspecified: Secondary | ICD-10-CM | POA: Diagnosis not present

## 2018-01-16 DIAGNOSIS — E876 Hypokalemia: Secondary | ICD-10-CM | POA: Diagnosis not present

## 2018-01-16 DIAGNOSIS — R109 Unspecified abdominal pain: Secondary | ICD-10-CM | POA: Diagnosis not present

## 2018-01-16 DIAGNOSIS — K861 Other chronic pancreatitis: Secondary | ICD-10-CM | POA: Diagnosis not present

## 2018-01-16 DIAGNOSIS — F419 Anxiety disorder, unspecified: Secondary | ICD-10-CM | POA: Diagnosis not present

## 2018-01-16 DIAGNOSIS — K921 Melena: Secondary | ICD-10-CM | POA: Diagnosis not present

## 2018-01-16 DIAGNOSIS — D509 Iron deficiency anemia, unspecified: Secondary | ICD-10-CM | POA: Diagnosis not present

## 2018-01-16 DIAGNOSIS — R112 Nausea with vomiting, unspecified: Secondary | ICD-10-CM | POA: Diagnosis not present

## 2018-01-16 DIAGNOSIS — Z9884 Bariatric surgery status: Secondary | ICD-10-CM | POA: Diagnosis not present

## 2018-01-17 DIAGNOSIS — R112 Nausea with vomiting, unspecified: Secondary | ICD-10-CM | POA: Diagnosis not present

## 2018-01-17 DIAGNOSIS — R197 Diarrhea, unspecified: Secondary | ICD-10-CM | POA: Diagnosis not present

## 2018-01-20 ENCOUNTER — Encounter: Payer: Self-pay | Admitting: Internal Medicine

## 2018-01-20 ENCOUNTER — Ambulatory Visit
Admission: RE | Admit: 2018-01-20 | Discharge: 2018-01-20 | Disposition: A | Discharge status: Home / Self Care | Payer: Medicare Other | Source: Ambulatory Visit | Attending: Internal Medicine | Admitting: Internal Medicine

## 2018-01-20 ENCOUNTER — Ambulatory Visit (INDEPENDENT_AMBULATORY_CARE_PROVIDER_SITE_OTHER): Payer: Medicare Other | Admitting: Internal Medicine

## 2018-01-20 VITALS — BP 140/80 | HR 92 | Ht 73.0 in | Wt 157.2 lb

## 2018-01-20 DIAGNOSIS — F331 Major depressive disorder, recurrent, moderate: Secondary | ICD-10-CM | POA: Diagnosis not present

## 2018-01-20 DIAGNOSIS — R51 Headache: Secondary | ICD-10-CM | POA: Diagnosis not present

## 2018-01-20 DIAGNOSIS — M79631 Pain in right forearm: Secondary | ICD-10-CM

## 2018-01-20 DIAGNOSIS — G8929 Other chronic pain: Secondary | ICD-10-CM | POA: Diagnosis not present

## 2018-01-20 DIAGNOSIS — I82611 Acute embolism and thrombosis of superficial veins of right upper extremity: Secondary | ICD-10-CM | POA: Insufficient documentation

## 2018-01-20 DIAGNOSIS — R519 Headache, unspecified: Secondary | ICD-10-CM

## 2018-01-20 DIAGNOSIS — R109 Unspecified abdominal pain: Secondary | ICD-10-CM

## 2018-01-20 DIAGNOSIS — M7989 Other specified soft tissue disorders: Secondary | ICD-10-CM | POA: Diagnosis not present

## 2018-01-20 MED ORDER — BUTALBITAL-APAP-CAFF-COD 50-300-40-30 MG PO CAPS: ORAL_CAPSULE | ORAL | 1 refills | Status: DC

## 2018-01-20 NOTE — Progress Notes (Signed)
[facility], [place] [address]  Internal MEDICINE  Office Visit Note  Patient Name: Stacy Pineda  [id number]  [id number]  Date of Service: 01/22/2018  Chief Complaint  Patient presents with  . Hospitalization Follow-up    pancreatitis    HPI  Pt is here for routine follow up. Pt has a very complicated course since 2012 after her gastric bypass. She started having abdominal pain with nausea and vomiting, had Cholecystectomy in 2013 and then multiple episodes of pancreatitis. She underwent MRCP/ERCP. Had sphincterotomy done for stenosis of sphincter of oddi. However her symptoms continued. She does require use of pancreatic enzymes.in the past she needed TPN  There is a mention of narcotic bowel syndrome and may be a functional problem. She had a revision of her gastric bypass in 2015 with no resolution in her symptoms She also had gastric ulcer, there is no h/o alcohol intake/abuse. She was recently hospitalized with same symptoms of nausea, poor po ntake and dehydration. Today she is c/o right arm pain near her elbow where IV was started, there is swelling and redness. She does have h/o DVT of the same arm, she took coumadin briefly    Current Medication: Outpatient Encounter Medications as of 01/20/2018  Medication Sig Note  . albuterol (VENTOLIN HFA) 108 (90 Base) MCG/ACT inhaler  06/28/2015: Received from: [facility]  . ASMANEX HFA 100 MCG/ACT AERO  06/28/2015: Received from: External Pharmacy  . CREON 36000 UNITS CPEP capsule  06/28/2015: Received from: External Pharmacy  . LORazepam (ATIVAN) 1 MG tablet Take 1 tablet (1 mg total) by mouth every 8 (eight) hours as needed for anxiety (nausea).   . medroxyPROGESTERone (DEPO-PROVERA) 150 MG/ML injection Inject 1 mL (150 mg total) into the muscle every 3 (three) months.   . mirtazapine (REMERON) 15 MG tablet Take 1 tablet (15 mg total) by mouth at bedtime.   . pantoprazole (PROTONIX) 40 MG tablet  Take by mouth.   Marland Kitchen PARoxetine (PAXIL) 40 MG tablet Take 1 tablet (40 mg total) by mouth daily.   . pregabalin (LYRICA) 200 MG capsule Take 200 mg by mouth 3 (three) times daily.  02/28/2016: Received from: [facility]  . promethazine (PHENERGAN) 6.25 MG/5ML syrup Take by mouth every 6 (six) hours as needed for nausea or vomiting.   . propranolol (INDERAL) 10 MG tablet Take 1 tablet (10 mg total) by mouth 3 (three) times daily. For physical symptoms of anxiety/panic   . sucralfate (CARAFATE) 1 g tablet Take by mouth.   . Butalbital-APAP-Caff-Cod 50-300-40-30 MG CAPS TAKE ONE TAB PO QD PRN FOR PAIN   . [DISCONTINUED] Butalbital-APAP-Caff-Cod 50-300-40-30 MG CAPS TAKE ONE TAB PO QD PRN FOR PAIN    No facility-administered encounter medications on file as of 01/20/2018.     Surgical History: Past Surgical History:  Procedure Laterality Date  . ABDOMINAL SURGERY    . CHOLECYSTECTOMY    . DECUBITUS ULCER EXCISION    . DILATION AND CURETTAGE OF UTERUS    . GASTRIC BYPASS    . GASTRIC BYPASS    . REVISION GASTRIC RESTRICTIVE PROCEDURE FOR MORBID OBESITY      Medical History: Past Medical History:  Diagnosis Date  . Anxiety   . Asthma   . Chronic headaches   . Depression   . Pancreatitis   . Pancreatitis   . PTSD (post-traumatic stress disorder)     Family History: Family History  Problem Relation Age of Onset  . Graves'  disease Mother   . Hypertension Mother   . Hyperlipidemia Mother   . Anxiety disorder Father   . Prostate cancer Father   . Depression Brother   . Anxiety disorder Brother   . Anxiety disorder Maternal Aunt   . Depression Maternal Aunt   . Anxiety disorder Paternal Aunt   . Depression Paternal Aunt   . Anxiety disorder Maternal Uncle   . Depression Maternal Uncle   . Anxiety disorder Paternal Uncle   . Depression Paternal Uncle   . Anxiety disorder Maternal Grandfather   . Depression Maternal Grandfather   . Diabetes Maternal Grandfather   . Anxiety  disorder Maternal Grandmother   . Depression Maternal Grandmother   . Breast cancer Maternal Grandmother   . Ovarian cancer Maternal Grandmother   . Diabetes Maternal Grandmother   . Colon cancer Neg Hx     Social History   Socioeconomic History  . Marital status: Single    Spouse name: Not on file  . Number of children: Not on file  . Years of education: Not on file  . Highest education level: Not on file  Occupational History  . Not on file  Social Needs  . Financial resource strain: Not on file  . Food insecurity:    Worry: Not on file    Inability: Not on file  . Transportation needs:    Medical: Not on file    Non-medical: Not on file  Tobacco Use  . Smoking status: Current Some Day Smoker    Packs/day: 0.25    Types: Cigars, E-cigarettes, Cigarettes    Start date: 03/26/2005  . Smokeless tobacco: Never Used  Substance and Sexual Activity  . Alcohol use: No    Alcohol/week: 0.0 oz  . Drug use: Yes    Frequency: 3.0 times per week    Types: Marijuana    Comment: MJ use weekly  . Sexual activity: Not Currently    Birth control/protection: Injection  Lifestyle  . Physical activity:    Days per week: Not on file    Minutes per session: Not on file  . Stress: Not on file  Relationships  . Social connections:    Talks on phone: Not on file    Gets together: Not on file    Attends religious service: Not on file    Active member of club or organization: Not on file    Attends meetings of clubs or organizations: Not on file    Relationship status: Not on file  . Intimate partner violence:    Fear of current or ex partner: Not on file    Emotionally abused: Not on file    Physically abused: Not on file    Forced sexual activity: Not on file  Other Topics Concern  . Not on file  Social History Narrative  . Not on file    Review of Systems  Constitutional: Negative for chills, diaphoresis and fatigue.  HENT: Negative for ear pain, postnasal drip and sinus  pressure.   Eyes: Negative for photophobia, discharge, redness, itching and visual disturbance.  Respiratory: Negative for cough, shortness of breath and wheezing.   Cardiovascular: Negative for chest pain, palpitations and leg swelling.  Gastrointestinal: Positive for nausea. Negative for abdominal pain, constipation, diarrhea and vomiting.  Genitourinary: Negative for dysuria and flank pain.  Musculoskeletal: Positive for back pain. Negative for arthralgias, gait problem and neck pain.  Skin: Positive for color change.       Right arm pain  and swelling   Allergic/Immunologic: Negative for environmental allergies and food allergies.  Neurological: Negative for dizziness and headaches.  Hematological: Bruises/bleeds easily.  Psychiatric/Behavioral: Negative for agitation, behavioral problems (depression) and hallucinations.    Vital Signs: BP 140/80   Pulse 92   Ht 6\' 1"  (1.854 m)   Wt 157 lb 3.2 oz (71.3 kg)   LMP  (LMP Unknown)   SpO2 97%   BMI 20.74 kg/m    Physical Exam  Constitutional: She is oriented to person, place, and time. She appears distressed.  Pt look anxious,   HENT:  Head: Normocephalic and atraumatic.  Mouth/Throat: Oropharynx is clear and moist. No oropharyngeal exudate.  Eyes: Pupils are equal, round, and reactive to light. EOM are normal.  Neck: Normal range of motion. Neck supple. No JVD present. No tracheal deviation present. No thyromegaly present.  Cardiovascular: Normal rate, regular rhythm and normal heart sounds. Exam reveals no gallop and no friction rub.  No murmur heard. Pulmonary/Chest: Effort normal. No respiratory distress. She has no wheezes. She has no rales. She exhibits no tenderness.  Abdominal: Soft. Bowel sounds are normal.  Musculoskeletal: Normal range of motion.  Lymphadenopathy:    She has no cervical adenopathy.  Neurological: She is alert and oriented to person, place, and time. No cranial nerve deficit.  Skin: Skin is warm and  dry. She is not diaphoretic.  There is area of induration at antecubital fossa extending midway in her forearm   Psychiatric: She has a normal mood and affect. Her behavior is normal. Judgment and thought content normal.   Assessment/Plan: 1. Pain and swelling of right forearm - US Venous Img Upper Uni Right; Future. Results discussed with the pt, superficial thrombus in antecubital vein, refer to vascular  2. Acute nonintractable headache, unspecified headache type - Monitor dependence - Butalbital-APAP-Caff-Cod 50-300-40-30 MG CAPS; TAKE ONE TAB PO QD PRN FOR PAIN  Dispense: 20 capsule; Refill: 1  3. MDD (major depressive disorder), recurrent episode, moderate (HCC) - Continue all meds as before   4. Chronic abdominal pain - Stable for now// GI and Surgery f/u   General Counseling: Alida verbalizes understanding of the findings of todays visit and agrees with plan of treatment. I have discussed any further diagnostic evaluation that may be needed or ordered today. We also reviewed her medications today. she has been encouraged to call the office with any questions or concerns that should arise related to todays visit.   Orders Placed This Encounter  Procedures  . US Venous Img Upper Uni Right    Meds ordered this encounter  Medications  . DISCONTD: Butalbital-APAP-Caff-Cod 50-300-40-30 MG CAPS    Sig: TAKE ONE TAB PO QD PRN FOR PAIN    Dispense:  20 capsule    Refill:  1  . Butalbital-APAP-Caff-Cod 50-300-40-30 MG CAPS    Sig: TAKE ONE TAB PO QD PRN FOR PAIN    Dispense:  20 capsule    Refill:  1    Order Specific Question:   Supervising Provider    Answer:   Lavera Guise [[id number]]    Time spent:25 Minutes  Dr Lavera Guise Internal medicine

## 2018-01-21 ENCOUNTER — Telehealth: Payer: Self-pay

## 2018-01-21 NOTE — Telephone Encounter (Signed)
Pt called that dr Humphrey Rolls called her yesterday for U/S result she had blood clot and w refer her vein and vascular I informed pt we going to put referal through epic they will calll her back and relayed message to beth for reffral

## 2018-01-22 ENCOUNTER — Telehealth: Payer: Self-pay | Admitting: Internal Medicine

## 2018-01-22 NOTE — Telephone Encounter (Signed)
Called patient to follow up on how she was feeling and to schedule appointment to see Dr Humphrey Rolls. Left message and asked her to call back. Beth

## 2018-01-27 NOTE — Progress Notes (Signed)
Prior Auth for medication Butalbital-apap-caff-cod 50-300-40-30mg  caps has been denied by insurance

## 2018-01-29 ENCOUNTER — Encounter (INDEPENDENT_AMBULATORY_CARE_PROVIDER_SITE_OTHER): Payer: Self-pay | Admitting: Vascular Surgery

## 2018-01-29 ENCOUNTER — Ambulatory Visit (INDEPENDENT_AMBULATORY_CARE_PROVIDER_SITE_OTHER): Payer: Medicare Other | Admitting: Vascular Surgery

## 2018-01-29 VITALS — BP 124/85 | HR 92 | Resp 13 | Ht 72.0 in | Wt 157.0 lb

## 2018-01-29 DIAGNOSIS — I82621 Acute embolism and thrombosis of deep veins of right upper extremity: Secondary | ICD-10-CM

## 2018-01-29 MED ORDER — APIXABAN 5 MG PO TABS: 5.0000 mg | ORAL_TABLET | Freq: Two times a day (BID) | ORAL | 5 refills | Status: DC

## 2018-01-29 MED ORDER — TRAMADOL HCL 50 MG PO TABS: ORAL_TABLET | ORAL | 0 refills | Status: DC

## 2018-01-29 NOTE — Progress Notes (Signed)
Subjective:    Patient ID: Stacy Pineda, female    DOB: [birth year], 37 y.o.   MRN: [id number] Chief Complaint  Patient presents with  . New Patient (Initial Visit)    Positive for DVT in Right Arm   Presents as a new patient referred by Dr. Chancy Milroy for elevation of a newly diagnosed right upper extremity DVT.  The patient endorses a history of a recent hospitalization and having IVs placed in the right upper extremity.  Soon after her inpatient stay the patient noticed an increased swelling and discomfort to the right upper extremity.  This prompted a ultrasound ordered by Dr. Chancy Milroy on January 20, 2018 which was notable for "thrombus is noted within the right cephalic and basilic veins. No central deep venous thrombus is noted."  Unfortunately the report does not note the chronicity or if the thromboses are occlusive.  The patient has been wearing a compression sleeve.  The patient has not been started on any anticoagulation.  The patient denies any ulcer formation to the right upper extremity.  The patient denies any shortness of breath or chest pain.  The patient denies any fever, nausea vomiting.  The patient does note this is her second DVT to the right upper extremity.  Review of Systems  Constitutional: Negative.   HENT: Negative.   Eyes: Negative.   Respiratory: Negative.   Cardiovascular:       Right upper extremity DVT  Gastrointestinal: Negative.   Endocrine: Negative.   Genitourinary: Negative.   Musculoskeletal: Negative.   Skin: Negative.   Allergic/Immunologic: Negative.   Neurological: Negative.   Hematological: Negative.   Psychiatric/Behavioral: Negative.       Objective:   Physical Exam  Constitutional: She is oriented to person, place, and time. She appears well-developed and well-nourished. No distress.  HENT:  Head: Normocephalic.  Right Ear: External ear normal.  Left Ear: External ear normal.  Eyes: Pupils are equal, round, and reactive to light. Conjunctivae and  EOM are normal.  Neck: Normal range of motion.  Cardiovascular: Normal rate, regular rhythm, normal heart sounds and intact distal pulses.  Pulses:      Radial pulses are 2+ on the right side, and 2+ on the left side.  Right upper extremity: Arm is soft.  Nontender to palpation.  Skin is intact.  There is no acute vascular compromise to the arm.  No edema.  Pulmonary/Chest: Effort normal and breath sounds normal.  Musculoskeletal: Normal range of motion.  Neurological: She is alert and oriented to person, place, and time.  Skin: Skin is warm and dry. She is not diaphoretic.  Psychiatric: She has a normal mood and affect. Her behavior is normal. Judgment and thought content normal.  Vitals reviewed.  BP 124/85 (BP Location: Left Arm, Patient Position: Sitting)   Pulse 92   Resp 13   Ht 6' (1.829 m)   Wt 157 lb (71.2 kg)   BMI 21.29 kg/m   Past Medical History:  Diagnosis Date  . Anxiety   . Asthma   . Chronic headaches   . Depression   . Pancreatitis   . Pancreatitis   . PTSD (post-traumatic stress disorder)    Social History   Socioeconomic History  . Marital status: Single    Spouse name: Not on file  . Number of children: Not on file  . Years of education: Not on file  . Highest education level: Not on file  Occupational History  . Not on file  Social  Needs  . Financial resource strain: Not on file  . Food insecurity:    Worry: Not on file    Inability: Not on file  . Transportation needs:    Medical: Not on file    Non-medical: Not on file  Tobacco Use  . Smoking status: Current Some Day Smoker    Packs/day: 0.25    Types: Cigars, E-cigarettes, Cigarettes    Start date: 03/26/2005  . Smokeless tobacco: Never Used  Substance and Sexual Activity  . Alcohol use: No    Alcohol/week: 0.0 oz  . Drug use: Yes    Frequency: 3.0 times per week    Types: Marijuana    Comment: MJ use weekly  . Sexual activity: Not Currently    Birth control/protection: Injection    Lifestyle  . Physical activity:    Days per week: Not on file    Minutes per session: Not on file  . Stress: Not on file  Relationships  . Social connections:    Talks on phone: Not on file    Gets together: Not on file    Attends religious service: Not on file    Active member of club or organization: Not on file    Attends meetings of clubs or organizations: Not on file    Relationship status: Not on file  . Intimate partner violence:    Fear of current or ex partner: Not on file    Emotionally abused: Not on file    Physically abused: Not on file    Forced sexual activity: Not on file  Other Topics Concern  . Not on file  Social History Narrative  . Not on file   Past Surgical History:  Procedure Laterality Date  . ABDOMINAL SURGERY    . CHOLECYSTECTOMY    . DECUBITUS ULCER EXCISION    . DILATION AND CURETTAGE OF UTERUS    . GASTRIC BYPASS    . GASTRIC BYPASS    . REVISION GASTRIC RESTRICTIVE PROCEDURE FOR MORBID OBESITY     Family History  Problem Relation Age of Onset  . Graves' disease Mother   . Hypertension Mother   . Hyperlipidemia Mother   . Anxiety disorder Father   . Prostate cancer Father   . Depression Brother   . Anxiety disorder Brother   . Anxiety disorder Maternal Aunt   . Depression Maternal Aunt   . Anxiety disorder Paternal Aunt   . Depression Paternal Aunt   . Anxiety disorder Maternal Uncle   . Depression Maternal Uncle   . Anxiety disorder Paternal Uncle   . Depression Paternal Uncle   . Anxiety disorder Maternal Grandfather   . Depression Maternal Grandfather   . Diabetes Maternal Grandfather   . Anxiety disorder Maternal Grandmother   . Depression Maternal Grandmother   . Breast cancer Maternal Grandmother   . Ovarian cancer Maternal Grandmother   . Diabetes Maternal Grandmother   . Colon cancer Neg Hx    Allergies  Allergen Reactions  . Fentanyl Itching  . Morphine And Related Itching    Itchy.   . Vicodin  [Hydrocodone-Acetaminophen] Itching      Assessment & Plan:  Presents as a new patient referred by Dr. Chancy Milroy for elevation of a newly diagnosed right upper extremity DVT.  The patient endorses a history of a recent hospitalization and having IVs placed in the right upper extremity.  Soon after her inpatient stay the patient noticed an increased swelling and discomfort to the right upper  extremity.  This prompted a ultrasound ordered by Dr. Chancy Milroy on January 20, 2018 which was notable for "thrombus is noted within the right cephalic and basilic veins. No central deep venous thrombus is noted."  Unfortunately the report does not note the chronicity or if the thromboses are occlusive.  The patient has been wearing a compression sleeve.  The patient has not been started on any anticoagulation.  The patient denies any ulcer formation to the right upper extremity.  The patient denies any shortness of breath or chest pain.  The patient denies any fever, nausea vomiting.  1. Deep vein thrombosis (DVT) of other vein of right upper extremity, unspecified chronicity ([place]) - Stable Patient presents with a right upper extremity DVT after a brief inpatient stay with IV placement to the right arm. I will start the patient on Eliquis 5 mg 1 tab twice daily I will order a DVT study to assess for any propagation of the DVT I will also order another right upper extremity DVT study for approximately 3 months out to assess the status of the DVT Patient is asking for pain medication due to right upper extremity discomfort.  I have prescribed tramadol 50 mg 1-2 tabs every 6 hours as needed for pain #60 no refills. The patient is to seek medical attention immediately if she should experience any chest pain or shortness of breath. The patient expresses her understanding  - VAS [place] UPPER EXTREMITY VENOUS DUPLEX; Future - US Venous Img Upper Uni Right; Future  Current Outpatient Medications on File Prior to Visit  Medication Sig  Dispense Refill  . albuterol (VENTOLIN HFA) 108 (90 Base) MCG/ACT inhaler     . ASMANEX HFA 100 MCG/ACT AERO     . Butalbital-APAP-Caff-Cod 50-300-40-30 MG CAPS TAKE ONE TAB PO QD PRN FOR PAIN 20 capsule 1  . CREON 36000 UNITS CPEP capsule     . LORazepam (ATIVAN) 1 MG tablet Take 1 tablet (1 mg total) by mouth every 8 (eight) hours as needed for anxiety (nausea). 90 tablet 0  . medroxyPROGESTERone (DEPO-PROVERA) 150 MG/ML injection Inject 1 mL (150 mg total) into the muscle every 3 (three) months. 1 mL 0  . mirtazapine (REMERON) 15 MG tablet Take 1 tablet (15 mg total) by mouth at bedtime. 90 tablet 1  . pantoprazole (PROTONIX) 40 MG tablet Take by mouth.    Marland Kitchen PARoxetine (PAXIL) 40 MG tablet Take 1 tablet (40 mg total) by mouth daily. 90 tablet 1  . promethazine (PHENERGAN) 6.25 MG/5ML syrup Take by mouth every 6 (six) hours as needed for nausea or vomiting.    . propranolol (INDERAL) 10 MG tablet Take 1 tablet (10 mg total) by mouth 3 (three) times daily. For physical symptoms of anxiety/panic 90 tablet 1  . sucralfate (CARAFATE) 1 g tablet Take by mouth.    . pregabalin (LYRICA) 200 MG capsule Take 200 mg by mouth 3 (three) times daily.      No current facility-administered medications on file prior to visit.    There are no Patient Instructions on file for this visit. No follow-ups on file.  Lonzell Dorris A Aaleigha Bozza, PA-C

## 2018-02-10 ENCOUNTER — Other Ambulatory Visit (INDEPENDENT_AMBULATORY_CARE_PROVIDER_SITE_OTHER): Payer: Self-pay | Admitting: Vascular Surgery

## 2018-02-10 DIAGNOSIS — M7989 Other specified soft tissue disorders: Secondary | ICD-10-CM

## 2018-02-10 DIAGNOSIS — Z09 Encounter for follow-up examination after completed treatment for conditions other than malignant neoplasm: Secondary | ICD-10-CM

## 2018-02-10 DIAGNOSIS — M79601 Pain in right arm: Secondary | ICD-10-CM

## 2018-02-10 DIAGNOSIS — I82621 Acute embolism and thrombosis of deep veins of right upper extremity: Secondary | ICD-10-CM

## 2018-02-12 ENCOUNTER — Ambulatory Visit
Admission: RE | Admit: 2018-02-12 | Discharge: 2018-02-12 | Disposition: A | Discharge status: Home / Self Care | Payer: Medicare Other | Source: Ambulatory Visit | Attending: Vascular Surgery | Admitting: Vascular Surgery

## 2018-02-12 DIAGNOSIS — Z09 Encounter for follow-up examination after completed treatment for conditions other than malignant neoplasm: Secondary | ICD-10-CM | POA: Diagnosis not present

## 2018-02-12 DIAGNOSIS — I82621 Acute embolism and thrombosis of deep veins of right upper extremity: Secondary | ICD-10-CM | POA: Insufficient documentation

## 2018-02-12 DIAGNOSIS — M7989 Other specified soft tissue disorders: Secondary | ICD-10-CM | POA: Insufficient documentation

## 2018-02-12 DIAGNOSIS — M79601 Pain in right arm: Secondary | ICD-10-CM | POA: Diagnosis not present

## 2018-02-12 DIAGNOSIS — M79621 Pain in right upper arm: Secondary | ICD-10-CM | POA: Diagnosis not present

## 2018-02-16 ENCOUNTER — Encounter: Payer: Self-pay | Admitting: Internal Medicine

## 2018-02-16 ENCOUNTER — Ambulatory Visit (INDEPENDENT_AMBULATORY_CARE_PROVIDER_SITE_OTHER): Payer: Medicare Other | Admitting: Internal Medicine

## 2018-02-16 VITALS — BP 122/88 | HR 84 | Resp 16 | Ht 73.0 in | Wt 158.2 lb

## 2018-02-16 DIAGNOSIS — F331 Major depressive disorder, recurrent, moderate: Secondary | ICD-10-CM

## 2018-02-16 DIAGNOSIS — D508 Other iron deficiency anemias: Secondary | ICD-10-CM

## 2018-02-16 DIAGNOSIS — R51 Headache: Secondary | ICD-10-CM | POA: Diagnosis not present

## 2018-02-16 DIAGNOSIS — T85868D Thrombosis due to other internal prosthetic devices, implants and grafts, subsequent encounter: Secondary | ICD-10-CM

## 2018-02-16 DIAGNOSIS — R519 Headache, unspecified: Secondary | ICD-10-CM

## 2018-02-16 MED ORDER — PROMETHAZINE HCL 6.25 MG/5ML PO SYRP: 12.5000 mg | ORAL_SOLUTION | Freq: Three times a day (TID) | ORAL | 2 refills | Status: DC | PRN

## 2018-02-16 NOTE — Progress Notes (Signed)
[facility] [place], [place] [address]  Internal MEDICINE  Office Visit Note  Patient Name: Stacy Pineda  [id number]  [id number]  Date of Service: 02/24/2018  Chief Complaint  Patient presents with  . Arm Pain  . Depression  . Headache  Pt is here for routine follow up. Pt has a very complicated course since 2012 after her gastric bypass. She started having abdominal pain with nausea and vomiting, had Cholecystectomy in 2013 and then multiple episodes of pancreatitis. She underwent MRCP/ERCP. Had sphincterotomy done for stenosis of sphincter of oddi. However her symptoms continued. She does require use of pancreatic enzymes.in the past she needed TPN  There is a mention of narcotic bowel syndrome and may be a functional problem. She had a revision of her gastric bypass in 2015 with no resolution in her symptoms She also had gastric ulcer, there is no h/o alcohol intake/abuse.She continues to have nutritional deficiencies and will need iron infusions periodically    Arm Pain   Incident onset: pt had a difficult IV in right forearm. developed s superficial clot. now on NOAC. The quality of the pain is described as aching. Pertinent negatives include no chest pain. Nothing aggravates the symptoms. Treatments tried: NOAC. The treatment provided moderate relief.  Depression       The patient presents with depression.  This is a chronic problem.  The current episode started more than 1 year ago.   The problem occurs intermittently.  Associated symptoms include headaches.  Associated symptoms include no fatigue.  Past treatments include SSRIs - Selective serotonin reuptake inhibitors.  Previous treatment provided significant (Seen by psych) relief.  Past medical history includes chronic pain, anxiety and depression.   Headache   This is a chronic problem. The current episode started more than 1 month ago. The problem occurs intermittently. The problem has been waxing and  waning. The pain is located in the bilateral region. The pain quality is similar to prior headaches. The pain is at a severity of 4/10. Pertinent negatives include no abdominal pain, back pain, coughing, dizziness, ear pain, eye redness, nausea, neck pain, photophobia, sinus pressure or vomiting. The treatment provided mild relief. Her past medical history is significant for cluster headaches.    Current Medication: Outpatient Encounter Medications as of 02/16/2018  Medication Sig Note  . albuterol (VENTOLIN HFA) 108 (90 Base) MCG/ACT inhaler  06/28/2015: Received from: [facility]  . apixaban (ELIQUIS) 5 MG TABS tablet Take 1 tablet (5 mg total) by mouth 2 (two) times daily.   Darlin Priestly HFA 100 MCG/ACT AERO  06/28/2015: Received from: External Pharmacy  . Butalbital-APAP-Caff-Cod 50-300-40-30 MG CAPS TAKE ONE TAB PO QD PRN FOR PAIN (Patient not taking: Reported on 02/22/2018)   . CREON 36000 UNITS CPEP capsule  06/28/2015: Received from: External Pharmacy  . medroxyPROGESTERone (DEPO-PROVERA) 150 MG/ML injection Inject 1 mL (150 mg total) into the muscle every 3 (three) months.   . pantoprazole (PROTONIX) 40 MG tablet Take by mouth.   Marland Kitchen PARoxetine (PAXIL) 40 MG tablet Take 1 tablet (40 mg total) by mouth daily.   . pregabalin (LYRICA) 200 MG capsule Take 200 mg by mouth 3 (three) times daily.  02/28/2016: Received from: [facility]  . promethazine (PHENERGAN) 6.25 MG/5ML syrup Take 10 mLs (12.5 mg total) by mouth every 8 (eight) hours as needed for nausea, vomiting or refractory nausea / vomiting.   . traMADol (ULTRAM) 50 MG tablet One to Two Tabs Every Six  Hours As Needed For Pain   . [DISCONTINUED] LORazepam (ATIVAN) 1 MG tablet Take 1 tablet (1 mg total) by mouth every 8 (eight) hours as needed for anxiety (nausea).   . [DISCONTINUED] mirtazapine (REMERON) 15 MG tablet Take 1 tablet (15 mg total) by mouth at bedtime.   . [DISCONTINUED] promethazine (PHENERGAN) 6.25 MG/5ML syrup Take by  mouth every 6 (six) hours as needed for nausea or vomiting.   . [DISCONTINUED] propranolol (INDERAL) 10 MG tablet Take 1 tablet (10 mg total) by mouth 3 (three) times daily. For physical symptoms of anxiety/panic 02/22/2018: As needed  . [DISCONTINUED] sucralfate (CARAFATE) 1 g tablet Take by mouth.    No facility-administered encounter medications on file as of 02/16/2018.     Surgical History: Past Surgical History:  Procedure Laterality Date  . ABDOMINAL SURGERY    . CHOLECYSTECTOMY    . DECUBITUS ULCER EXCISION    . DILATION AND CURETTAGE OF UTERUS    . GASTRIC BYPASS    . GASTRIC BYPASS    . REVISION GASTRIC RESTRICTIVE PROCEDURE FOR MORBID OBESITY      Medical History: Past Medical History:  Diagnosis Date  . Anxiety   . Asthma   . Chronic headaches   . Depression   . Pancreatitis   . Pancreatitis   . PTSD (post-traumatic stress disorder)     Family History: Family History  Problem Relation Age of Onset  . Graves' disease Mother   . Hypertension Mother   . Hyperlipidemia Mother   . Anxiety disorder Father   . Prostate cancer Father   . Depression Brother   . Anxiety disorder Brother   . Anxiety disorder Maternal Aunt   . Depression Maternal Aunt   . Anxiety disorder Paternal Aunt   . Depression Paternal Aunt   . Anxiety disorder Maternal Uncle   . Depression Maternal Uncle   . Anxiety disorder Paternal Uncle   . Depression Paternal Uncle   . Anxiety disorder Maternal Grandfather   . Depression Maternal Grandfather   . Diabetes Maternal Grandfather   . Anxiety disorder Maternal Grandmother   . Depression Maternal Grandmother   . Breast cancer Maternal Grandmother   . Ovarian cancer Maternal Grandmother   . Diabetes Maternal Grandmother   . Colon cancer Neg Hx     Social History   Socioeconomic History  . Marital status: Single    Spouse name: Not on file  . Number of children: Not on file  . Years of education: Not on file  . Highest education  level: Not on file  Occupational History  . Not on file  Social Needs  . Financial resource strain: Not on file  . Food insecurity:    Worry: Not on file    Inability: Not on file  . Transportation needs:    Medical: Not on file    Non-medical: Not on file  Tobacco Use  . Smoking status: Current Some Day Smoker    Packs/day: 0.25    Types: Cigars, E-cigarettes, Cigarettes    Start date: 03/26/2005  . Smokeless tobacco: Never Used  Substance and Sexual Activity  . Alcohol use: No    Alcohol/week: 0.0 oz  . Drug use: Yes    Frequency: 3.0 times per week    Types: Marijuana    Comment: MJ use weekly  . Sexual activity: Not Currently    Birth control/protection: Injection  Lifestyle  . Physical activity:    Days per week: Not on file  Minutes per session: Not on file  . Stress: Not on file  Relationships  . Social connections:    Talks on phone: Not on file    Gets together: Not on file    Attends religious service: Not on file    Active member of club or organization: Not on file    Attends meetings of clubs or organizations: Not on file    Relationship status: Not on file  . Intimate partner violence:    Fear of current or ex partner: Not on file    Emotionally abused: Not on file    Physically abused: Not on file    Forced sexual activity: Not on file  Other Topics Concern  . Not on file  Social History Narrative  . Not on file    Review of Systems  Constitutional: Negative for chills, diaphoresis and fatigue.  HENT: Negative for ear pain, postnasal drip and sinus pressure.   Eyes: Negative for photophobia, discharge, redness, itching and visual disturbance.  Respiratory: Negative for cough, shortness of breath and wheezing.   Cardiovascular: Negative for chest pain, palpitations and leg swelling.  Gastrointestinal: Negative for abdominal pain, constipation, diarrhea, nausea and vomiting.  Genitourinary: Negative for dysuria and flank pain.  Musculoskeletal:  Negative for arthralgias, back pain, gait problem and neck pain.  Skin: Negative for color change.  Allergic/Immunologic: Negative for environmental allergies and food allergies.  Neurological: Positive for headaches. Negative for dizziness.  Hematological: Does not bruise/bleed easily.  Psychiatric/Behavioral: Positive for depression. Negative for agitation, behavioral problems (depression) and hallucinations.    Vital Signs: BP 122/88   Pulse 84   Resp 16   Ht 6\' 1"  (1.854 m)   Wt 158 lb 3.2 oz (71.8 kg)   SpO2 96%   BMI 20.87 kg/m    Physical Exam  Constitutional: She is oriented to person, place, and time. She appears well-developed and well-nourished. No distress.  HENT:  Head: Normocephalic and atraumatic.  Mouth/Throat: Oropharynx is clear and moist. No oropharyngeal exudate.  Eyes: Pupils are equal, round, and reactive to light. EOM are normal.  Neck: Normal range of motion. Neck supple. No JVD present. No tracheal deviation present. No thyromegaly present.  Cardiovascular: Normal rate, regular rhythm and normal heart sounds. Exam reveals no gallop and no friction rub.  No murmur heard. Pulmonary/Chest: Effort normal. No respiratory distress. She has no wheezes. She has no rales. She exhibits no tenderness.  Abdominal: Soft. Bowel sounds are normal.  Musculoskeletal: Normal range of motion.  Lymphadenopathy:    She has no cervical adenopathy.  Neurological: She is alert and oriented to person, place, and time. No cranial nerve deficit.  Skin: Skin is warm and dry. She is not diaphoretic.  Psychiatric: She has a normal mood and affect. Her behavior is normal. Judgment and thought content normal.   Assessment/Plan: 1. Other iron deficiency anemia - Ambulatory referral to Hematology  2. MDD (major depressive disorder), recurrent episode, moderate ([place]) - per psych  3. Acute nonintractable headache, unspecified headache type - Controlled   4. Thrombus due to any  device, implant or graft, subsequent encounter - Per vascular, improving   General Counseling: Josi verbalizes understanding of the findings of todays visit and agrees with plan of treatment. I have discussed any further diagnostic evaluation that may be needed or ordered today. We also reviewed her medications today. she has been encouraged to call the office with any questions or concerns that should arise related to todays visit.  Orders Placed This Encounter  Procedures  . Ambulatory referral to Hematology    Meds ordered this encounter  Medications  . promethazine (PHENERGAN) 6.25 MG/5ML syrup    Sig: Take 10 mLs (12.5 mg total) by mouth every 8 (eight) hours as needed for nausea, vomiting or refractory nausea / vomiting.    Dispense:  240 mL    Refill:  2    Time spent:25 Minutes  Dr Lavera Guise Internal medicine

## 2018-02-19 ENCOUNTER — Inpatient Hospital Stay: Payer: Medicare Other | Admitting: Oncology

## 2018-02-22 ENCOUNTER — Other Ambulatory Visit: Payer: Self-pay

## 2018-02-22 ENCOUNTER — Inpatient Hospital Stay: Payer: Medicare Other | Attending: Oncology | Admitting: Oncology

## 2018-02-22 ENCOUNTER — Encounter: Payer: Self-pay | Admitting: Oncology

## 2018-02-22 VITALS — BP 123/89 | HR 86 | Temp 97.1°F | Resp 18 | Wt 159.1 lb

## 2018-02-22 DIAGNOSIS — D563 Thalassemia minor: Secondary | ICD-10-CM

## 2018-02-22 DIAGNOSIS — F17210 Nicotine dependence, cigarettes, uncomplicated: Secondary | ICD-10-CM | POA: Diagnosis not present

## 2018-02-22 DIAGNOSIS — D509 Iron deficiency anemia, unspecified: Secondary | ICD-10-CM

## 2018-02-22 DIAGNOSIS — E538 Deficiency of other specified B group vitamins: Secondary | ICD-10-CM

## 2018-02-22 NOTE — Progress Notes (Signed)
Patient here for for follow up  °

## 2018-02-24 ENCOUNTER — Inpatient Hospital Stay: Payer: Medicare Other

## 2018-02-24 ENCOUNTER — Ambulatory Visit (INDEPENDENT_AMBULATORY_CARE_PROVIDER_SITE_OTHER): Payer: Medicare Other | Admitting: Psychiatry

## 2018-02-24 ENCOUNTER — Encounter (HOSPITAL_COMMUNITY): Payer: Self-pay | Admitting: Psychiatry

## 2018-02-24 VITALS — BP 131/82 | HR 80 | Temp 96.0°F | Resp 18

## 2018-02-24 DIAGNOSIS — F17210 Nicotine dependence, cigarettes, uncomplicated: Secondary | ICD-10-CM | POA: Diagnosis not present

## 2018-02-24 DIAGNOSIS — F4312 Post-traumatic stress disorder, chronic: Secondary | ICD-10-CM | POA: Diagnosis not present

## 2018-02-24 DIAGNOSIS — F4001 Agoraphobia with panic disorder: Secondary | ICD-10-CM

## 2018-02-24 DIAGNOSIS — D509 Iron deficiency anemia, unspecified: Secondary | ICD-10-CM | POA: Diagnosis not present

## 2018-02-24 DIAGNOSIS — D563 Thalassemia minor: Secondary | ICD-10-CM | POA: Diagnosis not present

## 2018-02-24 MED ORDER — BUSPIRONE HCL 30 MG PO TABS: 30.0000 mg | ORAL_TABLET | Freq: Two times a day (BID) | ORAL | 0 refills | Status: DC

## 2018-02-24 MED ORDER — SODIUM CHLORIDE 0.9 % IV SOLN
Freq: Once | INTRAVENOUS | Status: AC
  Administered 2018-02-24: 14:00:00 via INTRAVENOUS
  Filled 2018-02-24: qty 1000

## 2018-02-24 MED ORDER — MIRTAZAPINE 30 MG PO TABS: 30.0000 mg | ORAL_TABLET | Freq: Every day | ORAL | 1 refills | Status: DC

## 2018-02-24 MED ORDER — LORAZEPAM 1 MG PO TABS
1.0000 mg | ORAL_TABLET | Freq: Three times a day (TID) | ORAL | 1 refills | Status: DC | PRN
Start: 2018-02-24 — End: 2018-07-21

## 2018-02-24 MED ORDER — SODIUM CHLORIDE 0.9 % IV SOLN
510.0000 mg | Freq: Once | INTRAVENOUS | Status: AC
  Administered 2018-02-24: 510 mg via INTRAVENOUS
  Filled 2018-02-24: qty 17

## 2018-02-24 NOTE — Progress Notes (Signed)
[facility] MD/PA/NP OP Progress Note  02/24/2018 11:13 AM Stacy Pineda  MRN:  [id number]  Chief Complaint: medication check  HPI: Stacy Pineda reports that the Ativan has helped quite a bit with anxiety and nausea.  She is continued to struggle with breakthrough anxiety and we discussed augmentation with BuSpar.  She has tried this in the past and it did not work but she was on a very low dose.  I educated her on the risks and the therapeutic dose being 30 mg twice a day.  We agreed to initiate and continue her current dose of Paxil.  We agreed to increase the dose of Remeron to 30 mg nightly for further improvements of sleep and mood.  Her primary care provider was interested in Cymbalta and/or Lexapro.  I educated the patient that Lexapro is a lower potency antianxiety medication and I suspect this would be much less effective for her than Paxil, and Cymbalta for chronic pain would need to be at such a high dose that it would prevent safe administration with Paxil.  Furthermore Cymbalta is not particularly effective for anxiety and panic symptoms.  Visit Diagnosis:    ICD-10-CM   1. Chronic post-traumatic stress disorder (PTSD) F43.12 mirtazapine (REMERON) 30 MG tablet    LORazepam (ATIVAN) 1 MG tablet    busPIRone (BUSPAR) 30 MG tablet  2. Panic disorder with agoraphobia F40.01 mirtazapine (REMERON) 30 MG tablet    LORazepam (ATIVAN) 1 MG tablet    busPIRone (BUSPAR) 30 MG tablet    Past Psychiatric History: See intake H&P for full details. Reviewed, with no updates at this time.   Past Medical History:  Past Medical History:  Diagnosis Date  . Anxiety   . Asthma   . Chronic headaches   . Depression   . Pancreatitis   . Pancreatitis   . PTSD (post-traumatic stress disorder)     Past Surgical History:  Procedure Laterality Date  . ABDOMINAL SURGERY    . CHOLECYSTECTOMY    . DECUBITUS ULCER EXCISION    . DILATION AND CURETTAGE OF UTERUS    . GASTRIC BYPASS    . GASTRIC BYPASS    .  REVISION GASTRIC RESTRICTIVE PROCEDURE FOR MORBID OBESITY      Family Psychiatric History: See intake H&P for full details. Reviewed, with no updates at this time.   Family History:  Family History  Problem Relation Age of Onset  . Graves' disease Mother   . Hypertension Mother   . Hyperlipidemia Mother   . Anxiety disorder Father   . Prostate cancer Father   . Depression Brother   . Anxiety disorder Brother   . Anxiety disorder Maternal Aunt   . Depression Maternal Aunt   . Anxiety disorder Paternal Aunt   . Depression Paternal Aunt   . Anxiety disorder Maternal Uncle   . Depression Maternal Uncle   . Anxiety disorder Paternal Uncle   . Depression Paternal Uncle   . Anxiety disorder Maternal Grandfather   . Depression Maternal Grandfather   . Diabetes Maternal Grandfather   . Anxiety disorder Maternal Grandmother   . Depression Maternal Grandmother   . Breast cancer Maternal Grandmother   . Ovarian cancer Maternal Grandmother   . Diabetes Maternal Grandmother   . Colon cancer Neg Hx     Social History:  Social History   Socioeconomic History  . Marital status: Single    Spouse name: Not on file  . Number of children: Not on file  . Years  of education: Not on file  . Highest education level: Not on file  Occupational History  . Not on file  Social Needs  . Financial resource strain: Not on file  . Food insecurity:    Worry: Not on file    Inability: Not on file  . Transportation needs:    Medical: Not on file    Non-medical: Not on file  Tobacco Use  . Smoking status: Current Some Day Smoker    Packs/day: 0.25    Types: Cigars, E-cigarettes, Cigarettes    Start date: 03/26/2005  . Smokeless tobacco: Never Used  Substance and Sexual Activity  . Alcohol use: No    Alcohol/week: 0.0 oz  . Drug use: Yes    Frequency: 3.0 times per week    Types: Marijuana    Comment: MJ use weekly  . Sexual activity: Not Currently    Birth control/protection: Injection   Lifestyle  . Physical activity:    Days per week: Not on file    Minutes per session: Not on file  . Stress: Not on file  Relationships  . Social connections:    Talks on phone: Not on file    Gets together: Not on file    Attends religious service: Not on file    Active member of club or organization: Not on file    Attends meetings of clubs or organizations: Not on file    Relationship status: Not on file  Other Topics Concern  . Not on file  Social History Narrative  . Not on file    Allergies:  Allergies  Allergen Reactions  . Fentanyl Itching  . Morphine And Related Itching    Itchy.   . Vicodin [Hydrocodone-Acetaminophen] Itching    Metabolic Disorder Labs: No results found for: HGBA1C, MPG No results found for: PROLACTIN No results found for: CHOL, TRIG, HDL, CHOLHDL, VLDL, LDLCALC No results found for: TSH  Therapeutic Level Labs: No results found for: LITHIUM No results found for: VALPROATE No components found for:  CBMZ  Current Medications: Current Outpatient Medications  Medication Sig Dispense Refill  . albuterol (VENTOLIN HFA) 108 (90 Base) MCG/ACT inhaler     . apixaban (ELIQUIS) 5 MG TABS tablet Take 1 tablet (5 mg total) by mouth 2 (two) times daily. 60 tablet 5  . ASMANEX HFA 100 MCG/ACT AERO     . busPIRone (BUSPAR) 30 MG tablet Take 1 tablet (30 mg total) by mouth 2 (two) times daily. 180 tablet 0  . Butalbital-APAP-Caff-Cod 50-300-40-30 MG CAPS TAKE ONE TAB PO QD PRN FOR PAIN (Patient not taking: Reported on 02/22/2018) 20 capsule 1  . CREON 36000 UNITS CPEP capsule     . LORazepam (ATIVAN) 1 MG tablet Take 1 tablet (1 mg total) by mouth every 8 (eight) hours as needed for anxiety (nausea). 90 tablet 1  . medroxyPROGESTERone (DEPO-PROVERA) 150 MG/ML injection Inject 1 mL (150 mg total) into the muscle every 3 (three) months. 1 mL 0  . mirtazapine (REMERON) 30 MG tablet Take 1 tablet (30 mg total) by mouth at bedtime. 90 tablet 1  . pantoprazole  (PROTONIX) 40 MG tablet Take by mouth.    Marland Kitchen PARoxetine (PAXIL) 40 MG tablet Take 1 tablet (40 mg total) by mouth daily. 90 tablet 1  . pregabalin (LYRICA) 200 MG capsule Take 200 mg by mouth 3 (three) times daily.     . promethazine (PHENERGAN) 6.25 MG/5ML syrup Take 10 mLs (12.5 mg total) by mouth every 8 (  eight) hours as needed for nausea, vomiting or refractory nausea / vomiting. 240 mL 2  . traMADol (ULTRAM) 50 MG tablet One to Two Tabs Every Six Hours As Needed For Pain 60 tablet 0   No current facility-administered medications for this visit.      Musculoskeletal: Strength & Muscle Tone: within normal limits Gait & Station: normal Patient leans: N/A  Psychiatric Specialty Exam: ROS  There were no vitals taken for this visit.There is no height or weight on file to calculate BMI.  General Appearance: Casual and Fairly Groomed  Eye Contact:  Good  Speech:  Clear and Coherent and Normal Rate  Volume:  Normal  Mood:  Anxious  Affect:  Congruent  Thought Process:  Coherent and Descriptions of Associations: Intact  Orientation:  Full (Time, Place, and Person)  Thought Content: Logical   Suicidal Thoughts:  No  Homicidal Thoughts:  No  Memory:  Immediate;   Fair  Judgement:  Fair  Insight:  Fair  Psychomotor Activity:  Normal  Concentration:  Attention Span: Good  Recall:  Good  Fund of Knowledge: Good  Language: Good  Akathisia:  Negative  Handed:  Right  AIMS (if indicated): not done  Assets:  Communication Skills Desire for Improvement Financial Resources/Insurance Housing  ADL's:  Intact  Cognition: WNL  Sleep:  Good   Screenings: MDI     Office Visit from 03/27/2015 in [facility]  Total Score (max 50)  44    PHQ2-9     Office Visit from 01/20/2018 in [facility] Office Visit from 12/28/2015 in [facility]  PHQ-2 Total Score  2  0  PHQ-9 Total Score  13  -       Assessment and  Plan: Stacy Pineda presents with some gradual improvements in her nausea and was medically hospitalized for a flare in pancreatitis.  She continues to stay in [place] with her aunt and is gradually making the transition back to her home in [place].  She continues with breakthrough anxiety and panic, and we discussed augmentation with BuSpar which she was agreeable to.  No acute safety issues, and she continues to have good family support.  We will proceed as below and follow-up in 6 weeks.  Disclosed to patient that this Probation officer is leaving this practice at the end of August 2019, and patients always has the right to choose their provider. Reassured patient that office will work to provide smooth transition of care whether they wish to remain at this office, or to continue with this provider, or seek alternative care options in community.  They expressed understanding.   1. Chronic post-traumatic stress disorder (PTSD)   2. Panic disorder with agoraphobia     Status of current problems: gradually worsening  Labs Ordered: No orders of the defined types were placed in this encounter.   Labs Reviewed: n/a  Collateral Obtained/Records Reviewed: n/a  Plan:  Continue paxil 40 mg, Lorazepam 1 mg up to 3 times daily Increase Remeron to 30 mg nightly BuSpar 30 mg twice a day; okay to take 15 mg twice a day for the first few days rtc 6 weeks  Aundra Dubin, MD 02/24/2018, 11:13 AM

## 2018-02-26 NOTE — Progress Notes (Signed)
[facility]  [phone] [phone]  ID: Stacy Pineda OB: 01/10/1981  MR#: [id number]  AXK#:[id number]  Patient Care Team: Ronnell Freshwater, NP as PCP - General (Family Medicine)  CHIEF COMPLAINT:  Iron deficiency anemia, beta thalassemia minor.  INTERVAL HISTORY: Patient last evaluated in July 2018.  She is referred back to clinic for declining hemoglobin and iron stores.  She has increased weakness and fatigue and has noticed she is eating more ice.  She otherwise feels well. She has no neurologic complaints. She has a good appetite and denies weight loss.  She denies any recent fevers or illnesses.  She denies any chest pain or shortness of breath. She denies any nausea, vomiting, constipation, or diarrhea.  She has no melena or hematochezia.  She has no urinary complaints.  Patient offers no further specific complaints today.  REVIEW OF SYSTEMS:   Review of Systems  Constitutional: Positive for malaise/fatigue. Negative for fever and weight loss.  Respiratory: Negative.  Negative for cough and shortness of breath.   Cardiovascular: Negative.  Negative for chest pain and leg swelling.  Gastrointestinal: Negative.  Negative for abdominal pain, blood in stool and melena.  Genitourinary: Negative.  Negative for hematuria.  Musculoskeletal: Negative.  Negative for back pain.  Skin: Negative.  Negative for rash.  Neurological: Positive for weakness. Negative for focal weakness and headaches.  Psychiatric/Behavioral: Negative.  The patient is not nervous/anxious.     As per HPI. Otherwise, a complete review of systems is negative.  PAST MEDICAL HISTORY: Past Medical History:  Diagnosis Date  . Anxiety   . Asthma   . Chronic headaches   . Depression   . Pancreatitis   . Pancreatitis   . PTSD (post-traumatic stress disorder)     PAST SURGICAL HISTORY: Past Surgical History:  Procedure Laterality Date  . ABDOMINAL SURGERY    .  CHOLECYSTECTOMY    . DECUBITUS ULCER EXCISION    . DILATION AND CURETTAGE OF UTERUS    . GASTRIC BYPASS    . GASTRIC BYPASS    . REVISION GASTRIC RESTRICTIVE PROCEDURE FOR MORBID OBESITY      FAMILY HISTORY Family History  Problem Relation Age of Onset  . Graves' disease Mother   . Hypertension Mother   . Hyperlipidemia Mother   . Anxiety disorder Father   . Prostate cancer Father   . Depression Brother   . Anxiety disorder Brother   . Anxiety disorder Maternal Aunt   . Depression Maternal Aunt   . Anxiety disorder Paternal Aunt   . Depression Paternal Aunt   . Anxiety disorder Maternal Uncle   . Depression Maternal Uncle   . Anxiety disorder Paternal Uncle   . Depression Paternal Uncle   . Anxiety disorder Maternal Grandfather   . Depression Maternal Grandfather   . Diabetes Maternal Grandfather   . Anxiety disorder Maternal Grandmother   . Depression Maternal Grandmother   . Breast cancer Maternal Grandmother   . Ovarian cancer Maternal Grandmother   . Diabetes Maternal Grandmother   . Colon cancer Neg Hx        ADVANCED DIRECTIVES:    HEALTH MAINTENANCE: Social History   Tobacco Use  . Smoking status: Current Some Day Smoker    Packs/day: 0.25    Types: Cigars, E-cigarettes, Cigarettes    Start date: 03/26/2005  . Smokeless tobacco: Never Used  Substance Use Topics  . Alcohol use: No    Alcohol/week: 0.0 oz  . Drug use: Yes  Frequency: 3.0 times per week    Types: Marijuana    Comment: MJ use weekly     Allergies  Allergen Reactions  . Fentanyl Itching  . Morphine And Related Itching    Itchy.   . Vicodin [Hydrocodone-Acetaminophen] Itching    Current Outpatient Medications  Medication Sig Dispense Refill  . albuterol (VENTOLIN HFA) 108 (90 Base) MCG/ACT inhaler     . apixaban (ELIQUIS) 5 MG TABS tablet Take 1 tablet (5 mg total) by mouth 2 (two) times daily. 60 tablet 5  . ASMANEX HFA 100 MCG/ACT AERO     . CREON 36000 UNITS CPEP capsule      . medroxyPROGESTERone (DEPO-PROVERA) 150 MG/ML injection Inject 1 mL (150 mg total) into the muscle every 3 (three) months. 1 mL 0  . PARoxetine (PAXIL) 40 MG tablet Take 1 tablet (40 mg total) by mouth daily. 90 tablet 1  . pregabalin (LYRICA) 200 MG capsule Take 200 mg by mouth 3 (three) times daily.     . promethazine (PHENERGAN) 6.25 MG/5ML syrup Take 10 mLs (12.5 mg total) by mouth every 8 (eight) hours as needed for nausea, vomiting or refractory nausea / vomiting. 240 mL 2  . traMADol (ULTRAM) 50 MG tablet One to Two Tabs Every Six Hours As Needed For Pain 60 tablet 0  . busPIRone (BUSPAR) 30 MG tablet Take 1 tablet (30 mg total) by mouth 2 (two) times daily. 180 tablet 0  . Butalbital-APAP-Caff-Cod 50-300-40-30 MG CAPS TAKE ONE TAB PO QD PRN FOR PAIN (Patient not taking: Reported on 02/22/2018) 20 capsule 1  . LORazepam (ATIVAN) 1 MG tablet Take 1 tablet (1 mg total) by mouth every 8 (eight) hours as needed for anxiety (nausea). 90 tablet 1  . mirtazapine (REMERON) 30 MG tablet Take 1 tablet (30 mg total) by mouth at bedtime. 90 tablet 1  . pantoprazole (PROTONIX) 40 MG tablet Take by mouth.     No current facility-administered medications for this visit.     OBJECTIVE: Vitals:   02/22/18 1109  BP: 123/89  Pulse: 86  Resp: 18  Temp: (!) 97.1 F (36.2 C)     Body mass index is 20.99 kg/m.    ECOG FS:0 - Asymptomatic  General: Well-developed, well-nourished, no acute distress. Eyes: Pink conjunctiva, anicteric sclera. HEENT: Normocephalic, moist mucous membranes, clear oropharnyx. Lungs: Clear to auscultation bilaterally. Heart: Regular rate and rhythm. No rubs, murmurs, or gallops. Abdomen: Soft, nontender, nondistended. No organomegaly noted, normoactive bowel sounds. Musculoskeletal: No edema, cyanosis, or clubbing. Neuro: Alert, answering all questions appropriately. Cranial nerves grossly intact. Skin: No rashes or petechiae noted. Psych: Normal affect. Lymphatics: No  cervical, calvicular, axillary or inguinal LAD.  LAB RESULTS:  Lab Results  Component Value Date   NA 142 04/07/2016   K 3.1 (L) 04/07/2016   CL 118 (H) 04/07/2016   CO2 18 (L) 04/07/2016   GLUCOSE 86 04/07/2016   BUN 13 04/07/2016   CREATININE 0.46 04/07/2016   CALCIUM 8.5 (L) 04/07/2016   PROT 6.3 (L) 04/07/2016   ALBUMIN 3.6 04/07/2016   AST 16 04/07/2016   ALT 14 04/07/2016   ALKPHOS 79 04/07/2016   BILITOT 0.4 04/07/2016   GFRNONAA >60 04/07/2016   GFRAA >60 04/07/2016    Lab Results  Component Value Date   WBC 5.9 02/26/2017   NEUTROABS 3.6 02/26/2017   HGB 10.3 (L) 02/26/2017   HCT 32.0 (L) 02/26/2017   MCV 72.2 (L) 02/26/2017   PLT 240 02/26/2017  Lab Results  Component Value Date   IRON 44 02/26/2017   TIBC 406 02/26/2017   IRONPCTSAT 11 02/26/2017    Lab Results  Component Value Date   FERRITIN 19 02/26/2017     STUDIES: US Venous Img Upper Uni Right  Result Date: 02/12/2018 CLINICAL DATA:  37 year old female with a history of right upper extremity thrombus 01/20/2018 and 07/07/2014 EXAM: RIGHT UPPER EXTREMITY VENOUS DOPPLER ULTRASOUND TECHNIQUE: Gray-scale sonography with graded compression, as well as color Doppler and duplex ultrasound were performed to evaluate the upper extremity deep venous system from the level of the subclavian vein and including the jugular, axillary, basilic, radial, ulnar and upper cephalic vein. Spectral Doppler was utilized to evaluate flow at rest and with distal augmentation maneuvers. COMPARISON:  01/20/2018, 07/07/2014 FINDINGS: Contralateral Subclavian Vein: Respiratory phasicity is normal and symmetric with the symptomatic side. No evidence of thrombus. Normal compressibility. Internal Jugular Vein: No evidence of thrombus. Normal compressibility, respiratory phasicity and response to augmentation. Subclavian Vein: No evidence of thrombus. Normal compressibility, respiratory phasicity and response to augmentation. Axillary  Vein: No evidence of thrombus. Normal compressibility, respiratory phasicity and response to augmentation. Cephalic Vein: No evidence of thrombus. Normal compressibility, respiratory phasicity and response to augmentation. Basilic Vein: No evidence of thrombus. Normal compressibility, respiratory phasicity and response to augmentation. Brachial Veins: No evidence of thrombus. Normal compressibility, respiratory phasicity and response to augmentation. Radial Veins: No evidence of thrombus. Normal compressibility, respiratory phasicity and response to augmentation. Ulnar Veins: No evidence of thrombus. Normal compressibility, respiratory phasicity and response to augmentation. Other Findings:  None visualized. IMPRESSION: Sonographic survey of the right upper extremity negative for acute DVT. Electronically Signed   By: Corrie Mckusick D.O.   On: 02/12/2018 12:33    ASSESSMENT: Iron deficiency anemia, beta thalassemia minor.  PLAN:    1. Iron deficiency anemia: Patient's most recent hemoglobin was reported at 9.2 and she was noted to have a ferritin of less than 7.  She is also symptomatic.  Previously, the remainder of her laboratory work was either negative or within normal limits.  Patient will return to clinic later this week to receive 510 mg IV Feraheme.  She will then return to clinic in 2 weeks for second infusion.  Return to clinic in 3 months with repeat laboratory work and further evaluation.    2. Heavy menses: Resolved. Continue Depo Provera per gynecology.  3.  Beta thalassemia minor: Confirmed by hemoglobin electrophoresis.  Patient will likely have a baseline microcytic anemia because of this. 4. Smudge cells: Noted on peripheral smear last clinic visit.  Previously, peripheral blood flow cytometry was negative.  I spent a total of 30 minutes face-to-face with the patient of which greater than 50% of the visit was spent in counseling and coordination of care as detailed above.   Patient  expressed understanding and was in agreement with this plan. She also understands that She can call clinic at any time with any questions, concerns, or complaints.    Lloyd Huger, MD 02/26/18 12:37 PM

## 2018-03-01 ENCOUNTER — Encounter (HOSPITAL_COMMUNITY): Payer: Self-pay | Admitting: Psychiatry

## 2018-03-05 ENCOUNTER — Ambulatory Visit (INDEPENDENT_AMBULATORY_CARE_PROVIDER_SITE_OTHER): Payer: Medicare Other | Admitting: Certified Nurse Midwife

## 2018-03-05 ENCOUNTER — Inpatient Hospital Stay: Payer: Medicare Other

## 2018-03-05 VITALS — BP 126/81 | HR 85 | Ht 73.0 in | Wt 167.2 lb

## 2018-03-05 VITALS — BP 125/85 | Temp 96.5°F | Resp 18

## 2018-03-05 DIAGNOSIS — Z3042 Encounter for surveillance of injectable contraceptive: Secondary | ICD-10-CM

## 2018-03-05 DIAGNOSIS — Z01419 Encounter for gynecological examination (general) (routine) without abnormal findings: Secondary | ICD-10-CM

## 2018-03-05 DIAGNOSIS — D509 Iron deficiency anemia, unspecified: Secondary | ICD-10-CM | POA: Diagnosis not present

## 2018-03-05 DIAGNOSIS — D563 Thalassemia minor: Secondary | ICD-10-CM | POA: Diagnosis not present

## 2018-03-05 DIAGNOSIS — F17210 Nicotine dependence, cigarettes, uncomplicated: Secondary | ICD-10-CM | POA: Diagnosis not present

## 2018-03-05 MED ORDER — SODIUM CHLORIDE 0.9 % IV SOLN
510.0000 mg | Freq: Once | INTRAVENOUS | Status: AC
  Administered 2018-03-05: 510 mg via INTRAVENOUS
  Filled 2018-03-05: qty 17

## 2018-03-05 MED ORDER — MEDROXYPROGESTERONE ACETATE 150 MG/ML IM SUSP
150.0000 mg | Freq: Once | INTRAMUSCULAR | Status: AC
  Administered 2018-03-05: 150 mg via INTRAMUSCULAR

## 2018-03-05 MED ORDER — SODIUM CHLORIDE 0.9 % IV SOLN
Freq: Once | INTRAVENOUS | Status: AC
  Administered 2018-03-05: 12:00:00 via INTRAVENOUS
  Filled 2018-03-05: qty 1000

## 2018-03-05 NOTE — Progress Notes (Signed)
Last depo inj:12/25/17 UPT:N/A Side effects:none Next Depo- Provera injection due: 9/27-10/11/19 Pt receives depo inj Q 10 weeks Annual exam due:Pt is here for an annual exam. LPS 2016 WNL.

## 2018-03-05 NOTE — Patient Instructions (Signed)
Medroxyprogesterone injection [Contraceptive] What is this medicine? MEDROXYPROGESTERONE (me DROX ee proe JES te rone) contraceptive injections prevent pregnancy. They provide effective birth control for 3 months. Depo-subQ Provera 104 is also used for treating pain related to endometriosis. This medicine may be used for other purposes; ask your health care provider or pharmacist if you have questions. COMMON BRAND NAME(S): Depo-Provera, Depo-subQ Provera 104 What should I tell my health care provider before I take this medicine? They need to know if you have any of these conditions: -frequently drink alcohol -asthma -blood vessel disease or a history of a blood clot in the lungs or legs -bone disease such as osteoporosis -breast cancer -diabetes -eating disorder (anorexia nervosa or bulimia) -high blood pressure -HIV infection or AIDS -kidney disease -liver disease -mental depression -migraine -seizures (convulsions) -stroke -tobacco smoker -vaginal bleeding -an unusual or allergic reaction to medroxyprogesterone, other hormones, medicines, foods, dyes, or preservatives -pregnant or trying to get pregnant -breast-feeding How should I use this medicine? Depo-Provera Contraceptive injection is given into a muscle. Depo-subQ Provera 104 injection is given under the skin. These injections are given by a health care professional. You must not be pregnant before getting an injection. The injection is usually given during the first 5 days after the start of a menstrual period or 6 weeks after delivery of a baby. Talk to your pediatrician regarding the use of this medicine in children. Special care may be needed. These injections have been used in female children who have started having menstrual periods. Overdosage: If you think you have taken too much of this medicine contact a poison control center or emergency room at once. NOTE: This medicine is only for you. Do not share this medicine  with others. What if I miss a dose? Try not to miss a dose. You must get an injection once every 3 months to maintain birth control. If you cannot keep an appointment, call and reschedule it. If you wait longer than 13 weeks between Depo-Provera contraceptive injections or longer than 14 weeks between Depo-subQ Provera 104 injections, you could get pregnant. Use another method for birth control if you miss your appointment. You may also need a pregnancy test before receiving another injection. What may interact with this medicine? Do not take this medicine with any of the following medications: -bosentan This medicine may also interact with the following medications: -aminoglutethimide -antibiotics or medicines for infections, especially rifampin, rifabutin, rifapentine, and griseofulvin -aprepitant -barbiturate medicines such as phenobarbital or primidone -bexarotene -carbamazepine -medicines for seizures like ethotoin, felbamate, oxcarbazepine, phenytoin, topiramate -modafinil -St. John's wort This list may not describe all possible interactions. Give your health care provider a list of all the medicines, herbs, non-prescription drugs, or dietary supplements you use. Also tell them if you smoke, drink alcohol, or use illegal drugs. Some items may interact with your medicine. What should I watch for while using this medicine? This drug does not protect you against HIV infection (AIDS) or other sexually transmitted diseases. Use of this product may cause you to lose calcium from your bones. Loss of calcium may cause weak bones (osteoporosis). Only use this product for more than 2 years if other forms of birth control are not right for you. The longer you use this product for birth control the more likely you will be at risk for weak bones. Ask your health care professional how you can keep strong bones. You may have a change in bleeding pattern or irregular periods. Many females stop having    periods while taking this drug. If you have received your injections on time, your chance of being pregnant is very low. If you think you may be pregnant, see your health care professional as soon as possible. Tell your health care professional if you want to get pregnant within the next year. The effect of this medicine may last a long time after you get your last injection. What side effects may I notice from receiving this medicine? Side effects that you should report to your doctor or health care professional as soon as possible: -allergic reactions like skin rash, itching or hives, swelling of the face, lips, or tongue -breast tenderness or discharge -breathing problems -changes in vision -depression -feeling faint or lightheaded, falls -fever -pain in the abdomen, chest, groin, or leg -problems with balance, talking, walking -unusually weak or tired -yellowing of the eyes or skin Side effects that usually do not require medical attention (report to your doctor or health care professional if they continue or are bothersome): -acne -fluid retention and swelling -headache -irregular periods, spotting, or absent periods -temporary pain, itching, or skin reaction at site where injected -weight gain This list may not describe all possible side effects. Call your doctor for medical advice about side effects. You may report side effects to FDA at 1-800-FDA-1088. Where should I keep my medicine? This does not apply. The injection will be given to you by a health care professional. NOTE: This sheet is a summary. It may not cover all possible information. If you have questions about this medicine, talk to your doctor, pharmacist, or health care provider.  2018 Elsevier/Gold Standard (2008-08-18 18:37:56) Preventive Care 18-39 Years, Female Preventive care refers to lifestyle choices and visits with your health care provider that can promote health and wellness. What does preventive care  include?  A yearly physical exam. This is also called an annual well check.  Dental exams once or twice a year.  Routine eye exams. Ask your health care provider how often you should have your eyes checked.  Personal lifestyle choices, including: ? Daily care of your teeth and gums. ? Regular physical activity. ? Eating a healthy diet. ? Avoiding tobacco and drug use. ? Limiting alcohol use. ? Practicing safe sex. ? Taking vitamin and mineral supplements as recommended by your health care provider. What happens during an annual well check? The services and screenings done by your health care provider during your annual well check will depend on your age, overall health, lifestyle risk factors, and family history of disease. Counseling Your health care provider may ask you questions about your:  Alcohol use.  Tobacco use.  Drug use.  Emotional well-being.  Home and relationship well-being.  Sexual activity.  Eating habits.  Work and work environment.  Method of birth control.  Menstrual cycle.  Pregnancy history.  Screening You may have the following tests or measurements:  Height, weight, and BMI.  Diabetes screening. This is done by checking your blood sugar (glucose) after you have not eaten for a while (fasting).  Blood pressure.  Lipid and cholesterol levels. These may be checked every 5 years starting at age 20.  Skin check.  Hepatitis C blood test.  Hepatitis B blood test.  Sexually transmitted disease (STD) testing.  BRCA-related cancer screening. This may be done if you have a family history of breast, ovarian, tubal, or peritoneal cancers.  Pelvic exam and Pap test. This may be done every 3 years starting at age 21. Starting at age 30,   this may be done every 5 years if you have a Pap test in combination with an HPV test.  Discuss your test results, treatment options, and if necessary, the need for more tests with your health care  provider. Vaccines Your health care provider may recommend certain vaccines, such as:  Influenza vaccine. This is recommended every year.  Tetanus, diphtheria, and acellular pertussis (Tdap, Td) vaccine. You may need a Td booster every 10 years.  Varicella vaccine. You may need this if you have not been vaccinated.  HPV vaccine. If you are 26 or younger, you may need three doses over 6 months.  Measles, mumps, and rubella (MMR) vaccine. You may need at least one dose of MMR. You may also need a second dose.  Pneumococcal 13-valent conjugate (PCV13) vaccine. You may need this if you have certain conditions and were not previously vaccinated.  Pneumococcal polysaccharide (PPSV23) vaccine. You may need one or two doses if you smoke cigarettes or if you have certain conditions.  Meningococcal vaccine. One dose is recommended if you are age 19-21 years and a first-year college student living in a residence hall, or if you have one of several medical conditions. You may also need additional booster doses.  Hepatitis A vaccine. You may need this if you have certain conditions or if you travel or work in places where you may be exposed to hepatitis A.  Hepatitis B vaccine. You may need this if you have certain conditions or if you travel or work in places where you may be exposed to hepatitis B.  Haemophilus influenzae type b (Hib) vaccine. You may need this if you have certain risk factors.  Talk to your health care provider about which screenings and vaccines you need and how often you need them. This information is not intended to replace advice given to you by your health care provider. Make sure you discuss any questions you have with your health care provider. Document Released: 09/23/2001 Document Revised: 04/16/2016 Document Reviewed: 05/29/2015 Elsevier Interactive Patient Education  2018 Elsevier Inc.  

## 2018-03-08 NOTE — Progress Notes (Signed)
ANNUAL PREVENTATIVE CARE GYN  ENCOUNTER NOTE  Subjective:       Stacy Pineda is a 37 y.o. G34P0010 female here for a routine annual gynecologic exam.  Current complaints: 1.  Needs Depo-provera injection  Still enrolled in school taking Pyschology and Sociology courses. Doing well overall, no complaints or concerns.   Endorses mild abdominal tenderness, but this is normal for her due to chronic pancreatitis.   Denies difficulty breathing or respiratory distress, chest pain, dysuria, excessive vaginal bleeding and leg pain or swelling.    Gynecologic History  No LMP recorded. Patient has had an injection.  Contraception: Depo-Provera injections  Last Pap: 03/2015. Results were: Negative/Negative  Obstetric History  OB History  Gravida Para Term Preterm AB Living  1       1    SAB TAB Ectopic Multiple Live Births  1            # Outcome Date GA Lbr Len/2nd Weight Sex Delivery Anes PTL Lv  1 SAB 1999            Past Medical History:  Diagnosis Date  . Anxiety   . Asthma   . Chronic headaches   . Depression   . Pancreatitis   . Pancreatitis   . PTSD (post-traumatic stress disorder)     Past Surgical History:  Procedure Laterality Date  . ABDOMINAL SURGERY    . CHOLECYSTECTOMY    . DECUBITUS ULCER EXCISION    . DILATION AND CURETTAGE OF UTERUS    . GASTRIC BYPASS    . GASTRIC BYPASS    . REVISION GASTRIC RESTRICTIVE PROCEDURE FOR MORBID OBESITY      Current Outpatient Medications on File Prior to Visit  Medication Sig Dispense Refill  . albuterol (VENTOLIN HFA) 108 (90 Base) MCG/ACT inhaler     . apixaban (ELIQUIS) 5 MG TABS tablet Take 1 tablet (5 mg total) by mouth 2 (two) times daily. 60 tablet 5  . ASMANEX HFA 100 MCG/ACT AERO     . busPIRone (BUSPAR) 30 MG tablet Take 1 tablet (30 mg total) by mouth 2 (two) times daily. 180 tablet 0  . Butalbital-APAP-Caff-Cod 50-300-40-30 MG CAPS TAKE ONE TAB PO QD PRN FOR PAIN 20 capsule 1  . CREON 36000 UNITS CPEP  capsule     . LORazepam (ATIVAN) 1 MG tablet Take 1 tablet (1 mg total) by mouth every 8 (eight) hours as needed for anxiety (nausea). 90 tablet 1  . medroxyPROGESTERone (DEPO-PROVERA) 150 MG/ML injection Inject 1 mL (150 mg total) into the muscle every 3 (three) months. 1 mL 0  . mirtazapine (REMERON) 30 MG tablet Take 1 tablet (30 mg total) by mouth at bedtime. 90 tablet 1  . PARoxetine (PAXIL) 40 MG tablet Take 1 tablet (40 mg total) by mouth daily. 90 tablet 1  . pregabalin (LYRICA) 200 MG capsule Take 200 mg by mouth 3 (three) times daily.     . promethazine (PHENERGAN) 6.25 MG/5ML syrup Take 10 mLs (12.5 mg total) by mouth every 8 (eight) hours as needed for nausea, vomiting or refractory nausea / vomiting. 240 mL 2  . pantoprazole (PROTONIX) 40 MG tablet Take by mouth.    . traMADol (ULTRAM) 50 MG tablet One to Two Tabs Every Six Hours As Needed For Pain (Patient not taking: Reported on 03/05/2018) 60 tablet 0   No current facility-administered medications on file prior to visit.     Allergies  Allergen Reactions  . Fentanyl Itching  .  Morphine And Related Itching    Itchy.   . Vicodin [Hydrocodone-Acetaminophen] Itching    Social History   Socioeconomic History  . Marital status: Single    Spouse name: Not on file  . Number of children: Not on file  . Years of education: Not on file  . Highest education level: Not on file  Occupational History  . Not on file  Social Needs  . Financial resource strain: Not on file  . Food insecurity:    Worry: Not on file    Inability: Not on file  . Transportation needs:    Medical: Not on file    Non-medical: Not on file  Tobacco Use  . Smoking status: Current Some Day Smoker    Packs/day: 0.25    Types: Cigars, E-cigarettes, Cigarettes    Start date: 03/26/2005  . Smokeless tobacco: Never Used  Substance and Sexual Activity  . Alcohol use: No    Alcohol/week: 0.0 oz  . Drug use: Yes    Frequency: 3.0 times per week    Types:  Marijuana    Comment: MJ use weekly  . Sexual activity: Not Currently    Birth control/protection: Injection  Lifestyle  . Physical activity:    Days per week: Not on file    Minutes per session: Not on file  . Stress: Not on file  Relationships  . Social connections:    Talks on phone: Not on file    Gets together: Not on file    Attends religious service: Not on file    Active member of club or organization: Not on file    Attends meetings of clubs or organizations: Not on file    Relationship status: Not on file  . Intimate partner violence:    Fear of current or ex partner: Not on file    Emotionally abused: Not on file    Physically abused: Not on file    Forced sexual activity: Not on file  Other Topics Concern  . Not on file  Social History Narrative  . Not on file    Family History  Problem Relation Age of Onset  . Graves' disease Mother   . Hypertension Mother   . Hyperlipidemia Mother   . Anxiety disorder Father   . Prostate cancer Father   . Depression Brother   . Anxiety disorder Brother   . Anxiety disorder Maternal Aunt   . Depression Maternal Aunt   . Anxiety disorder Paternal Aunt   . Depression Paternal Aunt   . Anxiety disorder Maternal Uncle   . Depression Maternal Uncle   . Anxiety disorder Paternal Uncle   . Depression Paternal Uncle   . Anxiety disorder Maternal Grandfather   . Depression Maternal Grandfather   . Diabetes Maternal Grandfather   . Anxiety disorder Maternal Grandmother   . Depression Maternal Grandmother   . Breast cancer Maternal Grandmother   . Ovarian cancer Maternal Grandmother   . Diabetes Maternal Grandmother   . Colon cancer Neg Hx     The following portions of the patient's history were reviewed and updated as appropriate: allergies, current medications, past family history, past medical history, past social history, past surgical history and problem list.  Review of Systems  ROS negative except as noted above.  Information obtained from patient.    Objective:   BP 126/81   Pulse 85   Ht 6\' 1"  (1.854 m)   Wt 167 lb 3 oz (75.8 kg)   BMI  22.06 kg/m    CONSTITUTIONAL: Well-developed, well-nourished female in no acute distress.   PSYCHIATRIC: Normal mood and affect. Normal behavior. Normal judgment and thought content.  [place]: Alert and oriented to person, place, and time. Normal muscle tone coordination. No cranial nerve deficit noted.  HENT:  Normocephalic, atraumatic, External right and left ear normal.  EYES: Conjunctivae and EOM are normal. Pupils are equal and round.    NECK: Normal range of motion, supple, no masses.  Normal thyroid.   SKIN: Skin is warm and dry. No rash noted. Not diaphoretic. No erythema. No pallor.  CARDIOVASCULAR: Normal heart rate noted, regular rhythm, no murmur.  RESPIRATORY: Clear to auscultation bilaterally. Effort and breath sounds normal, no problems with  respiration noted.  BREASTS: Symmetric in size. No masses, skin changes, nipple drainage, or lymphadenopathy.  ABDOMEN: Soft, normal bowel sounds, no distention noted.  Mild tenderness noted. No rebound or guarding.   PELVIC:  External Genitalia: Normal  Vagina: Normal  Cervix: Normal  Uterus: Normal  Adnexa: Normal   MUSCULOSKELETAL: Normal range of motion. No tenderness.  No cyanosis, clubbing, or edema.  2+ distal pulses.  LYMPHATIC: No Axillary, Supraclavicular, or Inguinal Adenopathy.  Assessment:   Annual gynecologic examination 37 y.o.   Contraception: Depo-Provera injections   Normal BMI   Problem List Items Addressed This Visit    None    Visit Diagnoses    Well woman exam    -  Primary   Depo-Provera contraceptive status          Plan:   Pap: Not needed  Mammogram: Will order next year  Labs: Declined  Routine preventative health maintenance measures emphasized: Exercise/Diet/Weight control, Tobacco Warnings, Alcohol/Substance use risks and Stress Management;  see AVS  Rx: Depo-Provera, see orders  Reviewed red flag symptoms and when to call  RTC x 1 year for Annual Exam or sooner if needed    Diona Fanti, CNM [facility], [facility]

## 2018-03-31 ENCOUNTER — Ambulatory Visit (HOSPITAL_COMMUNITY): Payer: Self-pay | Admitting: Psychiatry

## 2018-04-08 ENCOUNTER — Encounter (HOSPITAL_COMMUNITY): Payer: Self-pay | Admitting: Psychiatry

## 2018-04-08 ENCOUNTER — Ambulatory Visit (INDEPENDENT_AMBULATORY_CARE_PROVIDER_SITE_OTHER): Payer: Medicare Other | Admitting: Psychiatry

## 2018-04-08 DIAGNOSIS — F4001 Agoraphobia with panic disorder: Secondary | ICD-10-CM

## 2018-04-08 DIAGNOSIS — F1290 Cannabis use, unspecified, uncomplicated: Secondary | ICD-10-CM

## 2018-04-08 DIAGNOSIS — F17210 Nicotine dependence, cigarettes, uncomplicated: Secondary | ICD-10-CM | POA: Diagnosis not present

## 2018-04-08 DIAGNOSIS — F4312 Post-traumatic stress disorder, chronic: Secondary | ICD-10-CM | POA: Diagnosis not present

## 2018-04-08 DIAGNOSIS — Z818 Family history of other mental and behavioral disorders: Secondary | ICD-10-CM | POA: Diagnosis not present

## 2018-04-08 MED ORDER — MIRTAZAPINE 45 MG PO TABS: 45.0000 mg | ORAL_TABLET | Freq: Every day | ORAL | 0 refills | Status: DC

## 2018-04-08 NOTE — Progress Notes (Signed)
[place] MD/PA/NP OP Progress Note  04/08/2018 11:58 AM Stacy Pineda  MRN:  [id number]  Chief Complaint: medication check  HPI: Stacy Pineda reports her sleep has been a little bit better with the Remeron increase.  We agreed to increase further to 45 mg to target mood symptoms.  She continues to feel anxious about leaving the home.  She is going to be moving back into her apartment in [place].  She denies any acute safety concerns and reports that she is trying to think about some positive activities she can do such as volunteering.  She is agreeable to transition her care to [facility] given that this is closer to her home.  No other acute concerns at this time.  I encouraged her to look into individual therapy and group therapy options, and even explore some online support community's given that she is particularly concerned about gas money.  Visit Diagnosis:    ICD-10-CM   1. Chronic post-traumatic stress disorder (PTSD) F43.12 mirtazapine (REMERON) 45 MG tablet    Ambulatory referral to Psychiatry  2. Panic disorder with agoraphobia F40.01 mirtazapine (REMERON) 45 MG tablet    Ambulatory referral to Psychiatry    Past Psychiatric History: See intake H&P for full details. Reviewed, with no updates at this time.   Past Medical History:  Past Medical History:  Diagnosis Date  . Anxiety   . Asthma   . Chronic headaches   . Depression   . Pancreatitis   . Pancreatitis   . PTSD (post-traumatic stress disorder)     Past Surgical History:  Procedure Laterality Date  . ABDOMINAL SURGERY    . CHOLECYSTECTOMY    . DECUBITUS ULCER EXCISION    . DILATION AND CURETTAGE OF UTERUS    . GASTRIC BYPASS    . GASTRIC BYPASS    . REVISION GASTRIC RESTRICTIVE PROCEDURE FOR MORBID OBESITY      Family Psychiatric History: See intake H&P for full details. Reviewed, with no updates at this time.   Family History:  Family History  Problem Relation Age of Onset  . Graves' disease Mother   .  Hypertension Mother   . Hyperlipidemia Mother   . Anxiety disorder Father   . Prostate cancer Father   . Depression Brother   . Anxiety disorder Brother   . Anxiety disorder Maternal Aunt   . Depression Maternal Aunt   . Anxiety disorder Paternal Aunt   . Depression Paternal Aunt   . Anxiety disorder Maternal Uncle   . Depression Maternal Uncle   . Anxiety disorder Paternal Uncle   . Depression Paternal Uncle   . Anxiety disorder Maternal Grandfather   . Depression Maternal Grandfather   . Diabetes Maternal Grandfather   . Anxiety disorder Maternal Grandmother   . Depression Maternal Grandmother   . Breast cancer Maternal Grandmother   . Ovarian cancer Maternal Grandmother   . Diabetes Maternal Grandmother   . Colon cancer Neg Hx     Social History:  Social History   Socioeconomic History  . Marital status: Single    Spouse name: Not on file  . Number of children: Not on file  . Years of education: Not on file  . Highest education level: Not on file  Occupational History  . Not on file  Social Needs  . Financial resource strain: Not on file  . Food insecurity:    Worry: Not on file    Inability: Not on file  . Transportation needs:    Medical:  Not on file    Non-medical: Not on file  Tobacco Use  . Smoking status: Current Some Day Smoker    Packs/day: 0.25    Types: Cigars, E-cigarettes, Cigarettes    Start date: 03/26/2005  . Smokeless tobacco: Never Used  Substance and Sexual Activity  . Alcohol use: No    Alcohol/week: 0.0 standard drinks  . Drug use: Yes    Frequency: 3.0 times per week    Types: Marijuana    Comment: MJ use weekly  . Sexual activity: Not Currently    Birth control/protection: Injection  Lifestyle  . Physical activity:    Days per week: Not on file    Minutes per session: Not on file  . Stress: Not on file  Relationships  . Social connections:    Talks on phone: Not on file    Gets together: Not on file    Attends religious  service: Not on file    Active member of club or organization: Not on file    Attends meetings of clubs or organizations: Not on file    Relationship status: Not on file  Other Topics Concern  . Not on file  Social History Narrative  . Not on file    Allergies:  Allergies  Allergen Reactions  . Fentanyl Itching  . Morphine And Related Itching    Itchy.   . Vicodin [Hydrocodone-Acetaminophen] Itching    Metabolic Disorder Labs: No results found for: HGBA1C, MPG No results found for: PROLACTIN No results found for: CHOL, TRIG, HDL, CHOLHDL, VLDL, LDLCALC No results found for: TSH  Therapeutic Level Labs: No results found for: LITHIUM No results found for: VALPROATE No components found for:  CBMZ  Current Medications: Current Outpatient Medications  Medication Sig Dispense Refill  . albuterol (VENTOLIN HFA) 108 (90 Base) MCG/ACT inhaler     . ASMANEX HFA 100 MCG/ACT AERO     . Butalbital-APAP-Caff-Cod 50-300-40-30 MG CAPS TAKE ONE TAB PO QD PRN FOR PAIN 20 capsule 1  . CREON 36000 UNITS CPEP capsule     . LORazepam (ATIVAN) 1 MG tablet Take 1 tablet (1 mg total) by mouth every 8 (eight) hours as needed for anxiety (nausea). 90 tablet 1  . medroxyPROGESTERone (DEPO-PROVERA) 150 MG/ML injection Inject 1 mL (150 mg total) into the muscle every 3 (three) months. 1 mL 0  . mirtazapine (REMERON) 45 MG tablet Take 1 tablet (45 mg total) by mouth at bedtime. 90 tablet 0  . pantoprazole (PROTONIX) 40 MG tablet Take by mouth.    Marland Kitchen PARoxetine (PAXIL) 40 MG tablet Take 1 tablet (40 mg total) by mouth daily. 90 tablet 1  . promethazine (PHENERGAN) 6.25 MG/5ML syrup Take 10 mLs (12.5 mg total) by mouth every 8 (eight) hours as needed for nausea, vomiting or refractory nausea / vomiting. 240 mL 2   No current facility-administered medications for this visit.      Musculoskeletal: Strength & Muscle Tone: within normal limits Gait & Station: normal Patient leans: N/A  Psychiatric  Specialty Exam: ROS  Blood pressure 124/70, pulse 88, height 6\' 1"  (1.854 m), weight 161 lb (73 kg).Body mass index is 21.24 kg/m.  General Appearance: Casual and Fairly Groomed  Eye Contact:  Good  Speech:  Clear and Coherent and Normal Rate  Volume:  Normal  Mood:  Anxious and a little better  Affect:  Congruent  Thought Process:  Coherent and Descriptions of Associations: Intact  Orientation:  Full (Time, Place, and Person)  Thought  Content: Logical   Suicidal Thoughts:  No  Homicidal Thoughts:  No  Memory:  Immediate;   Fair  Judgement:  Fair  Insight:  Fair  Psychomotor Activity:  Normal  Concentration:  Attention Span: Good  Recall:  Good  Fund of Knowledge: Good  Language: Good  Akathisia:  Negative  Handed:  Right  AIMS (if indicated): not done  Assets:  Communication Skills Desire for Improvement Financial Resources/Insurance Housing  ADL's:  Intact  Cognition: WNL  Sleep:  Good   Screenings: MDI     Office Visit from 03/27/2015 in [facility]  Total Score (max 50)  44    PHQ2-9     Office Visit from 01/20/2018 in [facility] Office Visit from 12/28/2015 in [place]  PHQ-2 Total Score  2  0  PHQ-9 Total Score  13  -       Assessment and Plan: Anda Pineda presents for her last visit with this Probation officer.  She will be transitioning her care into [place] to continue with Dr. Shea Evans.  She has had a good response to Remeron increase so we agreed to increase further to 45 mg.  The BuSpar has been not particularly effective so we agreed to discontinue.  She is okay to continue on Ativan up to 3 times a day in conjunction with Paxil and we discussed some homework assignments including her starting individual therapy, and beginning some volunteer activities.  No acute safety issues at this time.  1. Chronic post-traumatic stress disorder (PTSD)   2. Panic disorder with agoraphobia      Status of current problems: gradually worsening  Labs Ordered: Orders Placed This Encounter  Procedures  . Ambulatory referral to Psychiatry    Referral Priority:   Routine    Referral Type:   Psychiatric    Referral Reason:   Specialty Services Required    Referred to Provider:   Ursula Alert, MD    Requested Specialty:   Psychiatry    Number of Visits Requested:   1    Labs Reviewed: n/a  Collateral Obtained/Records Reviewed: n/a  Plan:  Continue paxil 40 mg, Lorazepam 1 mg up to 3 times daily Remeron 45 mg rtc 8 weeks  Aundra Dubin, MD 04/08/2018, 11:58 AM

## 2018-05-12 ENCOUNTER — Telehealth (HOSPITAL_COMMUNITY): Payer: Self-pay

## 2018-05-12 NOTE — Telephone Encounter (Addendum)
Patient called in requesting a refill on Ativan, saying that she was told she would not receive anymore refills of this medication and she wanted to know if a doctor in our office would be willing to write  a prescription. This patient has been referred to our [place] office.  I spoke with Dr. Adele Schilder he refused to write a script of Ativan for this patient. Called and left a voicemail message for this patient [phone]

## 2018-05-14 NOTE — Progress Notes (Deleted)
Date last pap:  Last Depo-Provera: 03/08/18 Side Effects if any: NA Serum HCG indicated? NA Depo-Provera 150 mg IM given by: AS, CMA Next appointment due 07/26/18-07/30/18 Pt receives depo inj every 10 weeks

## 2018-05-17 ENCOUNTER — Ambulatory Visit: Payer: Self-pay

## 2018-05-17 NOTE — Progress Notes (Addendum)
Date last pap: NA Last Depo-Provera: 03/05/18 Side Effects if any: NA Serum HCG indicated? NA Depo-Provera 150 mg IM given by: AS, CMA Next appointment due 08/06/18-08/20/18 BP 135/87   Pulse 89   Ht 6\' 1"  (1.854 m)   Wt 169 lb (76.7 kg)   BMI 22.30 kg/m

## 2018-05-18 ENCOUNTER — Ambulatory Visit: Payer: Medicare Other | Admitting: Internal Medicine

## 2018-05-18 ENCOUNTER — Encounter: Payer: Self-pay | Admitting: Internal Medicine

## 2018-05-18 DIAGNOSIS — G43909 Migraine, unspecified, not intractable, without status migrainosus: Secondary | ICD-10-CM | POA: Diagnosis not present

## 2018-05-18 DIAGNOSIS — F331 Major depressive disorder, recurrent, moderate: Secondary | ICD-10-CM

## 2018-05-18 DIAGNOSIS — R109 Unspecified abdominal pain: Secondary | ICD-10-CM | POA: Diagnosis not present

## 2018-05-18 DIAGNOSIS — D508 Other iron deficiency anemias: Secondary | ICD-10-CM

## 2018-05-18 DIAGNOSIS — R112 Nausea with vomiting, unspecified: Secondary | ICD-10-CM

## 2018-05-18 DIAGNOSIS — G8929 Other chronic pain: Secondary | ICD-10-CM | POA: Diagnosis not present

## 2018-05-18 MED ORDER — PREGABALIN 50 MG PO CAPS: 50.0000 mg | ORAL_CAPSULE | Freq: Three times a day (TID) | ORAL | 3 refills | Status: DC

## 2018-05-18 MED ORDER — ESCITALOPRAM OXALATE 20 MG PO TABS: ORAL_TABLET | ORAL | 3 refills | Status: DC

## 2018-05-18 MED ORDER — FENTANYL 12 MCG/HR TD PT72: 12.5000 ug | MEDICATED_PATCH | TRANSDERMAL | 0 refills | Status: DC

## 2018-05-18 NOTE — Progress Notes (Signed)
[facility] [place], [place] [address]  Internal MEDICINE  Office Visit Note  Patient Name: Stacy Pineda  [id number]  [id number]  Date of Service: 05/18/2018  Chief Complaint  Patient presents with  . Anemia    3 month follow up, iron infusion per hematology  . Headache    controlled for now   . Depression    per psych  . Abdominal Pain    with nausea and vomiting after gatric bypass    HPI Pt has a very complicated history after gastric bypass. Continues to have abdominal pain with nausea and vomiting, feels depressed most of the time. Denies dependence on narcotics. Long discussion about drug abuse and finding a long term solution for this. Pt did see hematology and has received iron infusion    Current Medication: Outpatient Encounter Medications as of 05/18/2018  Medication Sig Note  . albuterol (VENTOLIN HFA) 108 (90 Base) MCG/ACT inhaler  06/28/2015: Received from: [facility]  . ASMANEX HFA 100 MCG/ACT AERO  06/28/2015: Received from: External Pharmacy  . Butalbital-APAP-Caff-Cod 50-300-40-30 MG CAPS TAKE ONE TAB PO QD PRN FOR PAIN   . CREON 36000 UNITS CPEP capsule  06/28/2015: Received from: External Pharmacy  . LORazepam (ATIVAN) 1 MG tablet Take 1 tablet (1 mg total) by mouth every 8 (eight) hours as needed for anxiety (nausea).   . medroxyPROGESTERone (DEPO-PROVERA) 150 MG/ML injection Inject 1 mL (150 mg total) into the muscle every 3 (three) months.   . mirtazapine (REMERON) 45 MG tablet Take 1 tablet (45 mg total) by mouth at bedtime.   . [DISCONTINUED] PARoxetine (PAXIL) 40 MG tablet Take 1 tablet (40 mg total) by mouth daily.   Marland Kitchen escitalopram (LEXAPRO) 20 MG tablet Take on tab qd with supper and increase as directed   . fentaNYL (DURAGESIC) 12 MCG/HR Place 1 patch (12.5 mcg total) onto the skin every 3 (three) days.   . pregabalin (LYRICA) 50 MG capsule Take 1 capsule (50 mg total) by mouth 3 (three) times daily.   . promethazine  (PHENERGAN) 6.25 MG/5ML syrup Take 10 mLs (12.5 mg total) by mouth every 8 (eight) hours as needed for nausea, vomiting or refractory nausea / vomiting. (Patient not taking: Reported on 04/08/2018)    No facility-administered encounter medications on file as of 05/18/2018.    Surgical History: Past Surgical History:  Procedure Laterality Date  . ABDOMINAL SURGERY    . CHOLECYSTECTOMY    . DECUBITUS ULCER EXCISION    . DILATION AND CURETTAGE OF UTERUS    . GASTRIC BYPASS    . GASTRIC BYPASS    . REVISION GASTRIC RESTRICTIVE PROCEDURE FOR MORBID OBESITY     Medical History: Past Medical History:  Diagnosis Date  . Anxiety   . Asthma   . Chronic headaches   . Depression   . Pancreatitis   . Pancreatitis   . PTSD (post-traumatic stress disorder)    Family History: Family History  Problem Relation Age of Onset  . Graves' disease Mother   . Hypertension Mother   . Hyperlipidemia Mother   . Anxiety disorder Father   . Prostate cancer Father   . Depression Brother   . Anxiety disorder Brother   . Anxiety disorder Maternal Aunt   . Depression Maternal Aunt   . Anxiety disorder Paternal Aunt   . Depression Paternal Aunt   . Anxiety disorder Maternal Uncle   . Depression Maternal Uncle   . Anxiety disorder Paternal Uncle   .  Depression Paternal Uncle   . Anxiety disorder Maternal Grandfather   . Depression Maternal Grandfather   . Diabetes Maternal Grandfather   . Anxiety disorder Maternal Grandmother   . Depression Maternal Grandmother   . Breast cancer Maternal Grandmother   . Ovarian cancer Maternal Grandmother   . Diabetes Maternal Grandmother   . Colon cancer Neg Hx     Social History   Socioeconomic History  . Marital status: Single    Spouse name: Not on file  . Number of children: Not on file  . Years of education: Not on file  . Highest education level: Not on file  Occupational History  . Not on file  Social Needs  . Financial resource strain: Not on  file  . Food insecurity:    Worry: Not on file    Inability: Not on file  . Transportation needs:    Medical: Not on file    Non-medical: Not on file  Tobacco Use  . Smoking status: Current Some Day Smoker    Packs/day: 0.25    Types: Cigars, E-cigarettes, Cigarettes    Start date: 03/26/2005  . Smokeless tobacco: Never Used  . Tobacco comment: pt smoke weekly  Substance and Sexual Activity  . Alcohol use: No    Alcohol/week: 0.0 standard drinks  . Drug use: Yes    Frequency: 3.0 times per week    Types: Marijuana    Comment: MJ use weekly  . Sexual activity: Not Currently    Birth control/protection: Injection  Lifestyle  . Physical activity:    Days per week: Not on file    Minutes per session: Not on file  . Stress: Not on file  Relationships  . Social connections:    Talks on phone: Not on file    Gets together: Not on file    Attends religious service: Not on file    Active member of club or organization: Not on file    Attends meetings of clubs or organizations: Not on file    Relationship status: Not on file  . Intimate partner violence:    Fear of current or ex partner: Not on file    Emotionally abused: Not on file    Physically abused: Not on file    Forced sexual activity: Not on file  Other Topics Concern  . Not on file  Social History Narrative  . Not on file    Review of Systems  Constitutional: Negative for chills, diaphoresis and fatigue.  HENT: Negative for ear pain, postnasal drip and sinus pressure.   Eyes: Negative for photophobia, discharge, redness, itching and visual disturbance.  Respiratory: Negative for cough, shortness of breath and wheezing.   Cardiovascular: Negative for chest pain, palpitations and leg swelling.  Gastrointestinal: Negative for abdominal pain, constipation, diarrhea, nausea and vomiting.  Genitourinary: Negative for dysuria and flank pain.  Musculoskeletal: Negative for arthralgias, back pain, gait problem and neck  pain.  Skin: Negative for color change.  Allergic/Immunologic: Negative for environmental allergies and food allergies.  Neurological: Negative for dizziness and headaches.  Hematological: Does not bruise/bleed easily.  Psychiatric/Behavioral: Negative for agitation, behavioral problems (depression) and hallucinations.    Vital Signs: BP (!) 133/94 (BP Location: Right Arm, Patient Position: Sitting, Cuff Size: Normal)   Pulse 82   Resp 16   Ht 6\' 1"  (1.854 m)   Wt 162 lb (73.5 kg)   SpO2 100%   BMI 21.37 kg/m    Physical Exam  Constitutional: She  is oriented to person, place, and time. She appears well-developed and well-nourished. No distress.  HENT:  Head: Normocephalic and atraumatic.  Mouth/Throat: Oropharynx is clear and moist. No oropharyngeal exudate.  Eyes: Pupils are equal, round, and reactive to light. EOM are normal.  Neck: Normal range of motion. Neck supple. No JVD present. No tracheal deviation present. No thyromegaly present.  Cardiovascular: Normal rate, regular rhythm and normal heart sounds. Exam reveals no gallop and no friction rub.  No murmur heard. Pulmonary/Chest: Effort normal. No respiratory distress. She has no wheezes. She has no rales. She exhibits no tenderness.  Abdominal: Soft. Bowel sounds are normal.  Musculoskeletal: Normal range of motion.  Lymphadenopathy:    She has no cervical adenopathy.  Neurological: She is alert and oriented to person, place, and time. No cranial nerve deficit.  Skin: Skin is warm and dry. She is not diaphoretic.  Psychiatric: She has a normal mood and affect. Her behavior is normal. Judgment and thought content normal.   Assessment/Plan: 1. Other iron deficiency anemia - Continue iron infusion with hematology  2. MDD (major depressive disorder), recurrent episode, moderate (HCC) - DC Paxil, taper Ativan  - escitalopram (LEXAPRO) 20 MG tablet; Take on tab qd with supper and increase as directed  Dispense: 60 tablet;  Refill: 3  3. Chronic abdominal pain - Reviewed risks and possible side effects associated with taking opiates, benzodiazepines and other CNS depressants. Combination of these could cause dizziness and drowsiness. Advised patient not to drive or operate machinery when taking these medications, as patient's and other's life can be at risk and will have consequences. Patient verbalized understanding in this matter. Dependence and abuse for these drugs will be monitored closely. A Controlled substance policy and procedure is on file which allows [facility] to order a urine drug screen test at any visit. Patient understands and agrees with the plan - pregabalin (LYRICA) 50 MG capsule; Take 1 capsule (50 mg total) by mouth 3 (three) times daily.  Dispense: 90 capsule; Refill: 3 - fentaNYL (DURAGESIC) 12 MCG/HR; Place 1 patch (12.5 mcg total) onto the skin every 3 (three) days.  Dispense: 10 patch; Refill: 0  4. Migraine without status migrainosus, not intractable, unspecified migraine type - Controlled for now, might need Topamax in future   5. Intractable vomiting with nausea, unspecified vomiting type - Continue Promethazine prn as before/ Need to continue to take her meds as prescribed   General Counseling: bryah ocheltree understanding of the findings of todays visit and agrees with plan of treatment. I have discussed any further diagnostic evaluation that may be needed or ordered today. We also reviewed her medications today. she has been encouraged to call the office with any questions or concerns that should arise related to todays visit.  Meds ordered this encounter  Medications  . escitalopram (LEXAPRO) 20 MG tablet    Sig: Take on tab qd with supper and increase as directed    Dispense:  60 tablet    Refill:  3  . pregabalin (LYRICA) 50 MG capsule    Sig: Take 1 capsule (50 mg total) by mouth 3 (three) times daily.    Dispense:  90 capsule    Refill:  3  . fentaNYL  (DURAGESIC) 12 MCG/HR    Sig: Place 1 patch (12.5 mcg total) onto the skin every 3 (three) days.    Dispense:  10 patch    Refill:  0    Time spent: 30 Minutes  Dr Latricia Heft  [facility]

## 2018-05-21 ENCOUNTER — Encounter: Payer: Self-pay | Admitting: Internal Medicine

## 2018-05-21 ENCOUNTER — Ambulatory Visit (INDEPENDENT_AMBULATORY_CARE_PROVIDER_SITE_OTHER): Payer: Medicare Other | Admitting: Certified Nurse Midwife

## 2018-05-21 VITALS — BP 135/87 | HR 89 | Ht 73.0 in | Wt 169.0 lb

## 2018-05-21 DIAGNOSIS — Z3042 Encounter for surveillance of injectable contraceptive: Secondary | ICD-10-CM | POA: Diagnosis not present

## 2018-05-21 MED ORDER — MEDROXYPROGESTERONE ACETATE 150 MG/ML IM SUSP
150.0000 mg | Freq: Once | INTRAMUSCULAR | Status: AC
  Administered 2018-05-21: 150 mg via INTRAMUSCULAR

## 2018-05-21 NOTE — Progress Notes (Signed)
SCANNED IN CONTROLLED SUBSTANCE PRESCRIPTION POLICY SIGNED ON 79/1/50.

## 2018-05-23 NOTE — Progress Notes (Signed)
[place]  [phone] [phone]) [phone]  ID: Stacy Pineda OB: Oct 03, 1980  MR#: [id number]  BTD#:[id number]  Patient Care Team: Ronnell Freshwater, NP as PCP - General (Family Medicine)  CHIEF COMPLAINT:  Iron deficiency anemia, beta thalassemia minor.  INTERVAL HISTORY: Patient returns to clinic today for repeat laboratory work, further evaluation, consideration of additional IV Feraheme.  She currently feels well and is asymptomatic.  She does not complain of weakness or fatigue today. She has no neurologic complaints. She has a good appetite and denies weight loss.  She denies any recent fevers or illnesses.  She denies any chest pain or shortness of breath. She denies any nausea, vomiting, constipation, or diarrhea.  She has no melena or hematochezia.  She has no urinary complaints.  Patient feels at her baseline offers no specific complaints today.   REVIEW OF SYSTEMS:   Review of Systems  Constitutional: Negative.  Negative for fever, malaise/fatigue and weight loss.  Respiratory: Negative.  Negative for cough and shortness of breath.   Cardiovascular: Negative.  Negative for chest pain and leg swelling.  Gastrointestinal: Negative.  Negative for abdominal pain, blood in stool and melena.  Genitourinary: Negative.  Negative for hematuria.  Musculoskeletal: Negative.  Negative for back pain.  Skin: Negative.  Negative for rash.  Neurological: Negative.  Negative for focal weakness, weakness and headaches.  Psychiatric/Behavioral: Negative.  The patient is not nervous/anxious.     As per HPI. Otherwise, a complete review of systems is negative.  PAST MEDICAL HISTORY: Past Medical History:  Diagnosis Date  . Anxiety   . Asthma   . Chronic headaches   . Depression   . Pancreatitis   . Pancreatitis   . PTSD (post-traumatic stress disorder)     PAST SURGICAL HISTORY: Past Surgical History:  Procedure Laterality Date  . ABDOMINAL SURGERY      . CHOLECYSTECTOMY    . DECUBITUS ULCER EXCISION    . DILATION AND CURETTAGE OF UTERUS    . GASTRIC BYPASS    . GASTRIC BYPASS    . REVISION GASTRIC RESTRICTIVE PROCEDURE FOR MORBID OBESITY      FAMILY HISTORY Family History  Problem Relation Age of Onset  . Graves' disease Mother   . Hypertension Mother   . Hyperlipidemia Mother   . Anxiety disorder Father   . Prostate cancer Father   . Depression Brother   . Anxiety disorder Brother   . Anxiety disorder Maternal Aunt   . Depression Maternal Aunt   . Anxiety disorder Paternal Aunt   . Depression Paternal Aunt   . Anxiety disorder Maternal Uncle   . Depression Maternal Uncle   . Anxiety disorder Paternal Uncle   . Depression Paternal Uncle   . Anxiety disorder Maternal Grandfather   . Depression Maternal Grandfather   . Diabetes Maternal Grandfather   . Anxiety disorder Maternal Grandmother   . Depression Maternal Grandmother   . Breast cancer Maternal Grandmother   . Ovarian cancer Maternal Grandmother   . Diabetes Maternal Grandmother   . Colon cancer Neg Hx        ADVANCED DIRECTIVES:    HEALTH MAINTENANCE: Social History   Tobacco Use  . Smoking status: Current Some Day Smoker    Packs/day: 0.25    Types: Cigars, E-cigarettes, Cigarettes    Start date: 03/26/2005  . Smokeless tobacco: Never Used  . Tobacco comment: pt smoke weekly  Substance Use Topics  . Alcohol use: No    Alcohol/week:  0.0 standard drinks  . Drug use: Yes    Frequency: 3.0 times per week    Types: Marijuana    Comment: MJ use weekly     Allergies  Allergen Reactions  . Fentanyl Itching  . Morphine And Related Itching    Itchy.   . Vicodin [Hydrocodone-Acetaminophen] Itching    Current Outpatient Medications  Medication Sig Dispense Refill  . albuterol (VENTOLIN HFA) 108 (90 Base) MCG/ACT inhaler     . ASMANEX HFA 100 MCG/ACT AERO     . Butalbital-APAP-Caff-Cod 50-300-40-30 MG CAPS TAKE ONE TAB PO QD PRN FOR PAIN 20  capsule 1  . CREON 36000 UNITS CPEP capsule     . escitalopram (LEXAPRO) 20 MG tablet Take on tab qd with supper and increase as directed 60 tablet 3  . fentaNYL (DURAGESIC) 12 MCG/HR Place 1 patch (12.5 mcg total) onto the skin every 3 (three) days. 10 patch 0  . LORazepam (ATIVAN) 1 MG tablet Take 1 tablet (1 mg total) by mouth every 8 (eight) hours as needed for anxiety (nausea). 90 tablet 1  . medroxyPROGESTERone (DEPO-PROVERA) 150 MG/ML injection Inject 1 mL (150 mg total) into the muscle every 3 (three) months. 1 mL 0  . mirtazapine (REMERON) 45 MG tablet Take 1 tablet (45 mg total) by mouth at bedtime. 90 tablet 0  . pregabalin (LYRICA) 50 MG capsule Take 1 capsule (50 mg total) by mouth 3 (three) times daily. 90 capsule 3  . promethazine (PHENERGAN) 6.25 MG/5ML syrup Take 10 mLs (12.5 mg total) by mouth every 8 (eight) hours as needed for nausea, vomiting or refractory nausea / vomiting. (Patient not taking: Reported on 04/08/2018) 240 mL 2   No current facility-administered medications for this visit.     OBJECTIVE: Vitals:   05/24/18 1312  BP: 118/78  Pulse: 70  Resp: 18  Temp: (!) 97.1 F (36.2 C)     Body mass index is 22.49 kg/m.    ECOG FS:0 - Asymptomatic  General: Well-developed, well-nourished, no acute distress. Eyes: Pink conjunctiva, anicteric sclera. HEENT: Normocephalic, moist mucous membranes. Lungs: Clear to auscultation bilaterally. Heart: Regular rate and rhythm. No rubs, murmurs, or gallops. Abdomen: Soft, nontender, nondistended. No organomegaly noted, normoactive bowel sounds. Musculoskeletal: No edema, cyanosis, or clubbing. Neuro: Alert, answering all questions appropriately. Cranial nerves grossly intact. Skin: No rashes or petechiae noted. Psych: Normal affect.  LAB RESULTS:  Lab Results  Component Value Date   NA 142 04/07/2016   K 3.1 (L) 04/07/2016   CL 118 (H) 04/07/2016   CO2 18 (L) 04/07/2016   GLUCOSE 86 04/07/2016   BUN 13 04/07/2016    CREATININE 0.46 04/07/2016   CALCIUM 8.5 (L) 04/07/2016   PROT 6.3 (L) 04/07/2016   ALBUMIN 3.6 04/07/2016   AST 16 04/07/2016   ALT 14 04/07/2016   ALKPHOS 79 04/07/2016   BILITOT 0.4 04/07/2016   GFRNONAA >60 04/07/2016   GFRAA >60 04/07/2016    Lab Results  Component Value Date   WBC 7.9 05/24/2018   NEUTROABS 4.2 05/24/2018   HGB 10.7 (L) 05/24/2018   HCT 32.9 (L) 05/24/2018   MCV 72.6 (L) 05/24/2018   PLT 251 05/24/2018   Lab Results  Component Value Date   IRON 124 05/24/2018   TIBC 335 05/24/2018   IRONPCTSAT 37 (H) 05/24/2018    Lab Results  Component Value Date   FERRITIN 35 05/24/2018     STUDIES: No results found.  ASSESSMENT: Iron deficiency anemia, beta thalassemia  minor.  PLAN:    1. Iron deficiency anemia: Patient's most recent hemoglobin is 10.7 which is likely her baseline given her underlying beta thalassemia.  Her iron stores are within normal limits. Previously, the remainder of her laboratory work was either negative or within normal limits.  She does not require additional Feraheme today.  Patient last received treatment on March 05, 2018.  Return to clinic in 4 months with repeat laboratory work and further evaluation.     2. Heavy menses: Resolved. Continue Depo Provera per gynecology.  3. Beta thalassemia minor: Confirmed by hemoglobin electrophoresis.  Patient will likely have a baseline microcytic anemia because of this. 4. Smudge cells: Previously noted on peripheral blood smear.  Peripheral blood flow cytometry was negative.  Patient expressed understanding and was in agreement with this plan. She also understands that She can call clinic at any time with any questions, concerns, or complaints.    Lloyd Huger, MD 05/24/18 3:49 PM

## 2018-05-24 ENCOUNTER — Inpatient Hospital Stay (HOSPITAL_BASED_OUTPATIENT_CLINIC_OR_DEPARTMENT_OTHER): Payer: Medicare Other | Admitting: Oncology

## 2018-05-24 ENCOUNTER — Inpatient Hospital Stay: Payer: Medicare Other

## 2018-05-24 ENCOUNTER — Inpatient Hospital Stay: Payer: Medicare Other | Attending: Oncology

## 2018-05-24 ENCOUNTER — Encounter: Payer: Self-pay | Admitting: Oncology

## 2018-05-24 VITALS — BP 118/78 | HR 70 | Temp 97.1°F | Resp 18 | Wt 170.5 lb

## 2018-05-24 DIAGNOSIS — F17210 Nicotine dependence, cigarettes, uncomplicated: Secondary | ICD-10-CM | POA: Insufficient documentation

## 2018-05-24 DIAGNOSIS — D509 Iron deficiency anemia, unspecified: Secondary | ICD-10-CM | POA: Diagnosis not present

## 2018-05-24 DIAGNOSIS — D563 Thalassemia minor: Secondary | ICD-10-CM | POA: Diagnosis not present

## 2018-05-24 LAB — CBC WITH DIFFERENTIAL/PLATELET
Abs Immature Granulocytes: 0.05 10*3/uL (ref 0.00–0.07)
Basophils Absolute: 0 10*3/uL (ref 0.0–0.1)
Basophils Relative: 1 %
EOS PCT: 2 %
Eosinophils Absolute: 0.2 10*3/uL (ref 0.0–0.5)
HEMATOCRIT: 32.9 % — AB (ref 36.0–46.0)
HEMOGLOBIN: 10.7 g/dL — AB (ref 12.0–15.0)
Immature Granulocytes: 1 %
LYMPHS PCT: 39 %
Lymphs Abs: 3.1 10*3/uL (ref 0.7–4.0)
MCH: 23.6 pg — ABNORMAL LOW (ref 26.0–34.0)
MCHC: 32.5 g/dL (ref 30.0–36.0)
MCV: 72.6 fL — ABNORMAL LOW (ref 80.0–100.0)
MONO ABS: 0.4 10*3/uL (ref 0.1–1.0)
Monocytes Relative: 6 %
Neutro Abs: 4.2 10*3/uL (ref 1.7–7.7)
Neutrophils Relative %: 51 %
Platelets: 251 10*3/uL (ref 150–400)
RBC: 4.53 MIL/uL (ref 3.87–5.11)
RDW: 18.9 % — ABNORMAL HIGH (ref 11.5–15.5)
WBC: 7.9 10*3/uL (ref 4.0–10.5)
nRBC: 1.9 % — ABNORMAL HIGH (ref 0.0–0.2)

## 2018-05-24 LAB — IRON AND TIBC
IRON: 124 ug/dL (ref 28–170)
SATURATION RATIOS: 37 % — AB (ref 10.4–31.8)
TIBC: 335 ug/dL (ref 250–450)
UIBC: 211 ug/dL

## 2018-05-24 LAB — FERRITIN: Ferritin: 35 ng/mL (ref 11–307)

## 2018-05-24 NOTE — Progress Notes (Signed)
I have reviewed the record and concur with patient management and plan of care.    Easton Sivertson Michelle Jermine Bibbee, CNM [facility] 

## 2018-05-24 NOTE — Progress Notes (Signed)
Pt in for follow up, reports decreased energy levels and craving ice again.

## 2018-05-28 ENCOUNTER — Encounter (INDEPENDENT_AMBULATORY_CARE_PROVIDER_SITE_OTHER): Payer: Medicare Other

## 2018-05-28 ENCOUNTER — Ambulatory Visit (INDEPENDENT_AMBULATORY_CARE_PROVIDER_SITE_OTHER): Payer: Medicare Other | Admitting: Nurse Practitioner

## 2018-06-22 ENCOUNTER — Ambulatory Visit (INDEPENDENT_AMBULATORY_CARE_PROVIDER_SITE_OTHER): Payer: Medicare Other | Admitting: Internal Medicine

## 2018-06-22 ENCOUNTER — Encounter: Payer: Self-pay | Admitting: Internal Medicine

## 2018-06-22 VITALS — BP 132/75 | HR 87 | Resp 16 | Ht 73.0 in | Wt 172.4 lb

## 2018-06-22 DIAGNOSIS — F331 Major depressive disorder, recurrent, moderate: Secondary | ICD-10-CM | POA: Diagnosis not present

## 2018-06-22 DIAGNOSIS — G8929 Other chronic pain: Secondary | ICD-10-CM | POA: Diagnosis not present

## 2018-06-22 DIAGNOSIS — G43909 Migraine, unspecified, not intractable, without status migrainosus: Secondary | ICD-10-CM | POA: Diagnosis not present

## 2018-06-22 DIAGNOSIS — R109 Unspecified abdominal pain: Secondary | ICD-10-CM | POA: Diagnosis not present

## 2018-06-22 DIAGNOSIS — Z79899 Other long term (current) drug therapy: Secondary | ICD-10-CM | POA: Diagnosis not present

## 2018-06-22 DIAGNOSIS — R112 Nausea with vomiting, unspecified: Secondary | ICD-10-CM

## 2018-06-22 LAB — POCT URINE DRUG SCREEN
POC AMPHETAMINE UR: NOT DETECTED
POC BARBITURATE UR: NOT DETECTED
POC BENZODIAZEPINES UR: NOT DETECTED
POC COCAINE UR: NOT DETECTED
POC ECSTASY UR: NOT DETECTED
POC METHADONE UR: NOT DETECTED
POC METHAMPHETAMINE UR: NOT DETECTED
POC Marijuana UR: POSITIVE — AB
POC Opiate Ur: NOT DETECTED
POC Oxycodone UR: NOT DETECTED
POC PHENCYCLIDINE UR: NOT DETECTED
POC TRICYCLICS UR: POSITIVE — AB

## 2018-06-22 MED ORDER — PROMETHAZINE HCL 6.25 MG/5ML PO SYRP: 12.5000 mg | ORAL_SOLUTION | Freq: Three times a day (TID) | ORAL | 2 refills | Status: DC | PRN

## 2018-06-22 NOTE — Progress Notes (Signed)
[facility] [place], [place] [address]  Internal MEDICINE  Office Visit Note  Patient Name: Stacy Pineda  [id number]  [id number]  Date of Service: 06/22/2018  Chief Complaint  Patient presents with  . Asthma  . Depression  . Nausea    HPI  Pt has a very complicated history after gastric bypass. Continues to have abdominal pain with nausea and vomiting, feels depressed most of the time. Denies dependence on narcotics. Long discussion about drug abuse and finding a long term solution for this. Pt did see hematology and has not received iron infusion  She was started on Duragesic patch on previous visit for chronic abdominal pain and nausea Depression is slightly improved   Current Medication: Outpatient Encounter Medications as of 06/22/2018  Medication Sig Note  . albuterol (VENTOLIN HFA) 108 (90 Base) MCG/ACT inhaler  06/28/2015: Received from: [facility]  . ASMANEX HFA 100 MCG/ACT AERO  06/28/2015: Received from: External Pharmacy  . Butalbital-APAP-Caff-Cod 50-300-40-30 MG CAPS TAKE ONE TAB PO QD PRN FOR PAIN   . CREON 36000 UNITS CPEP capsule  06/28/2015: Received from: External Pharmacy  . escitalopram (LEXAPRO) 20 MG tablet Take on tab qd with supper and increase as directed (Patient taking differently: Take two tab qd with supper and increase as directed)   . fentaNYL (DURAGESIC) 12 MCG/HR Place 1 patch (12.5 mcg total) onto the skin every 3 (three) days.   . medroxyPROGESTERone (DEPO-PROVERA) 150 MG/ML injection Inject 1 mL (150 mg total) into the muscle every 3 (three) months.   . mirtazapine (REMERON) 45 MG tablet Take 1 tablet (45 mg total) by mouth at bedtime.   . pregabalin (LYRICA) 50 MG capsule Take 1 capsule (50 mg total) by mouth 3 (three) times daily.   . promethazine (PHENERGAN) 6.25 MG/5ML syrup Take 10 mLs (12.5 mg total) by mouth every 8 (eight) hours as needed for nausea, vomiting or refractory nausea / vomiting.   .  [DISCONTINUED] promethazine (PHENERGAN) 6.25 MG/5ML syrup Take 10 mLs (12.5 mg total) by mouth every 8 (eight) hours as needed for nausea, vomiting or refractory nausea / vomiting.   Marland Kitchen LORazepam (ATIVAN) 1 MG tablet Take 1 tablet (1 mg total) by mouth every 8 (eight) hours as needed for anxiety (nausea). (Patient not taking: Reported on 06/22/2018)    No facility-administered encounter medications on file as of 06/22/2018.     Surgical History: Past Surgical History:  Procedure Laterality Date  . ABDOMINAL SURGERY    . CHOLECYSTECTOMY    . DECUBITUS ULCER EXCISION    . DILATION AND CURETTAGE OF UTERUS    . GASTRIC BYPASS    . GASTRIC BYPASS    . REVISION GASTRIC RESTRICTIVE PROCEDURE FOR MORBID OBESITY      Medical History: Past Medical History:  Diagnosis Date  . Anxiety   . Asthma   . Chronic headaches   . Depression   . Pancreatitis   . Pancreatitis   . PTSD (post-traumatic stress disorder)     Family History: Family History  Problem Relation Age of Onset  . Graves' disease Mother   . Hypertension Mother   . Hyperlipidemia Mother   . Anxiety disorder Father   . Prostate cancer Father   . Depression Brother   . Anxiety disorder Brother   . Anxiety disorder Maternal Aunt   . Depression Maternal Aunt   . Anxiety disorder Paternal Aunt   . Depression Paternal Aunt   . Anxiety disorder Maternal Uncle   .  Depression Maternal Uncle   . Anxiety disorder Paternal Uncle   . Depression Paternal Uncle   . Anxiety disorder Maternal Grandfather   . Depression Maternal Grandfather   . Diabetes Maternal Grandfather   . Anxiety disorder Maternal Grandmother   . Depression Maternal Grandmother   . Breast cancer Maternal Grandmother   . Ovarian cancer Maternal Grandmother   . Diabetes Maternal Grandmother   . Colon cancer Neg Hx     Social History   Socioeconomic History  . Marital status: Single    Spouse name: Not on file  . Number of children: Not on file  . Years  of education: Not on file  . Highest education level: Not on file  Occupational History  . Not on file  Social Needs  . Financial resource strain: Not on file  . Food insecurity:    Worry: Not on file    Inability: Not on file  . Transportation needs:    Medical: Not on file    Non-medical: Not on file  Tobacco Use  . Smoking status: Current Some Day Smoker    Packs/day: 0.25    Types: Cigars, E-cigarettes, Cigarettes    Start date: 03/26/2005  . Smokeless tobacco: Never Used  . Tobacco comment: pt smoke weekly  Substance and Sexual Activity  . Alcohol use: No    Alcohol/week: 0.0 standard drinks  . Drug use: Yes    Frequency: 3.0 times per week    Types: Marijuana    Comment: MJ use weekly  . Sexual activity: Not Currently    Birth control/protection: Injection  Lifestyle  . Physical activity:    Days per week: Not on file    Minutes per session: Not on file  . Stress: Not on file  Relationships  . Social connections:    Talks on phone: Not on file    Gets together: Not on file    Attends religious service: Not on file    Active member of club or organization: Not on file    Attends meetings of clubs or organizations: Not on file    Relationship status: Not on file  . Intimate partner violence:    Fear of current or ex partner: Not on file    Emotionally abused: Not on file    Physically abused: Not on file    Forced sexual activity: Not on file  Other Topics Concern  . Not on file  Social History Narrative  . Not on file   Review of Systems  Constitutional: Negative for chills, diaphoresis and fatigue.  HENT: Negative for ear pain, postnasal drip and sinus pressure.   Eyes: Negative for photophobia, discharge, redness, itching and visual disturbance.  Respiratory: Negative for cough, shortness of breath and wheezing.   Cardiovascular: Negative for chest pain, palpitations and leg swelling.  Gastrointestinal: Negative for abdominal pain, constipation,  diarrhea, nausea and vomiting.  Genitourinary: Negative for dysuria and flank pain.  Musculoskeletal: Negative for arthralgias, back pain, gait problem and neck pain.  Skin: Negative for color change.  Allergic/Immunologic: Negative for environmental allergies and food allergies.  Neurological: Negative for dizziness and headaches.  Hematological: Does not bruise/bleed easily.  Psychiatric/Behavioral: Negative for agitation, behavioral problems (depression) and hallucinations.   Vital Signs: BP 132/75 (BP Location: Right Arm, Patient Position: Sitting, Cuff Size: Large)   Pulse 87   Resp 16   Ht 6\' 1"  (1.854 m)   Wt 172 lb 6.4 oz (78.2 kg)   SpO2 97%  BMI 22.75 kg/m    Physical Exam  Constitutional: She is oriented to person, place, and time. She appears well-developed and well-nourished. No distress.  HENT:  Head: Normocephalic and atraumatic.  Mouth/Throat: Oropharynx is clear and moist. No oropharyngeal exudate.  Eyes: Pupils are equal, round, and reactive to light. EOM are normal.  Neck: Normal range of motion. Neck supple. No JVD present. No tracheal deviation present. No thyromegaly present.  Cardiovascular: Normal rate, regular rhythm and normal heart sounds. Exam reveals no gallop and no friction rub.  No murmur heard. Pulmonary/Chest: Effort normal. No respiratory distress. She has no wheezes. She has no rales. She exhibits no tenderness.  Abdominal: Soft. Bowel sounds are normal.  Musculoskeletal: Normal range of motion.  Lymphadenopathy:    She has no cervical adenopathy.  Neurological: She is alert and oriented to person, place, and time. No cranial nerve deficit.  Skin: Skin is warm and dry. She is not diaphoretic.  Psychiatric: She has a normal mood and affect. Her behavior is normal. Judgment and thought content normal.   Assessment/Plan: 1. Other iron deficiency anemia - Continue iron supplement and follow up with Hematology   2. MDD (major depressive  disorder), recurrent episode, moderate (HCC) Pt is tapering her ativan  - continue to increase  escitalopram (LEXAPRO) 20 MG tablet; Take on tab qd with supper and increase as directed  Dispense: 60 tablet; Refill: 3  3. Chronic abdominal pain - Reviewed risks and possible side effects associated with taking opiates, benzodiazepines and other CNS depressants. Combination of these could cause dizziness and drowsiness. Advised patient not to drive or operate machinery when taking these medications, as patient's and other's life can be at risk and will have consequences. Patient verbalized understanding in this matter. Dependence and abuse for these drugs will be monitored closely. A Controlled substance policy and procedure is on file which allows [facility] to order a urine drug screen test at any visit. Patient understands and agrees with the plan - pregabalin (LYRICA) 50 MG capsule; Take 1 capsule (50 mg total) by mouth 3 (three) times daily.  Dispense: 90 capsule; Refill: 3 - fentaNYL (DURAGESIC) 12 MCG/HR; Place 1 patch (12.5 mcg total) onto the skin every 3 (three) days.  Dispense: 10 patch; Refill: 0  4. Migraine without status migrainosus, not intractable, unspecified migraine type - Controlled for now, might need Topamax in future   5. Intractable vomiting with nausea, unspecified vomiting type - Continue Promethazine prn as before/ Need to continue to take her meds as prescribed   General Counseling: levy wellman understanding of the findings of todays visit and agrees with plan of treatment. I have discussed any further diagnostic evaluation that may be needed or ordered today. We also reviewed her medications today. she has been encouraged to call the office with any questions or concerns that should arise related to todays visit.    Orders Placed This Encounter  Procedures  . Ambulatory referral to Psychiatry  . POCT Urine Drug Screen    Meds ordered this  encounter  Medications  . promethazine (PHENERGAN) 6.25 MG/5ML syrup    Sig: Take 10 mLs (12.5 mg total) by mouth every 8 (eight) hours as needed for nausea, vomiting or refractory nausea / vomiting.    Dispense:  240 mL    Refill:  2    Time spent: 25 Minutes  Dr Lavera Guise Internal medicine

## 2018-06-28 ENCOUNTER — Telehealth: Payer: Self-pay

## 2018-06-28 DIAGNOSIS — R109 Unspecified abdominal pain: Principal | ICD-10-CM

## 2018-06-28 DIAGNOSIS — G8929 Other chronic pain: Secondary | ICD-10-CM

## 2018-06-29 ENCOUNTER — Ambulatory Visit: Payer: Self-pay | Admitting: Adult Health

## 2018-06-29 MED ORDER — FENTANYL 12 MCG/HR TD PT72: 12.5000 ug | MEDICATED_PATCH | TRANSDERMAL | 0 refills | Status: DC

## 2018-06-30 NOTE — Telephone Encounter (Signed)
Pt advised as per dfk we send fentanyl patch and she appt with adam for next refills on dec

## 2018-07-16 DIAGNOSIS — F4312 Post-traumatic stress disorder, chronic: Secondary | ICD-10-CM | POA: Diagnosis not present

## 2018-07-16 DIAGNOSIS — F332 Major depressive disorder, recurrent severe without psychotic features: Secondary | ICD-10-CM | POA: Diagnosis not present

## 2018-07-16 DIAGNOSIS — F410 Panic disorder [episodic paroxysmal anxiety] without agoraphobia: Secondary | ICD-10-CM | POA: Diagnosis not present

## 2018-07-21 ENCOUNTER — Encounter: Payer: Self-pay | Admitting: Adult Health

## 2018-07-21 ENCOUNTER — Ambulatory Visit (INDEPENDENT_AMBULATORY_CARE_PROVIDER_SITE_OTHER): Payer: Medicare Other | Admitting: Adult Health

## 2018-07-21 VITALS — BP 116/72 | HR 88 | Resp 16 | Ht 73.0 in | Wt 180.0 lb

## 2018-07-21 DIAGNOSIS — Z8711 Personal history of peptic ulcer disease: Secondary | ICD-10-CM | POA: Diagnosis not present

## 2018-07-21 DIAGNOSIS — F4310 Post-traumatic stress disorder, unspecified: Secondary | ICD-10-CM | POA: Diagnosis not present

## 2018-07-21 DIAGNOSIS — E559 Vitamin D deficiency, unspecified: Secondary | ICD-10-CM

## 2018-07-21 DIAGNOSIS — Z114 Encounter for screening for human immunodeficiency virus [HIV]: Secondary | ICD-10-CM

## 2018-07-21 DIAGNOSIS — Z0001 Encounter for general adult medical examination with abnormal findings: Secondary | ICD-10-CM | POA: Diagnosis not present

## 2018-07-21 DIAGNOSIS — D508 Other iron deficiency anemias: Secondary | ICD-10-CM

## 2018-07-21 DIAGNOSIS — F411 Generalized anxiety disorder: Secondary | ICD-10-CM

## 2018-07-21 DIAGNOSIS — G8929 Other chronic pain: Secondary | ICD-10-CM

## 2018-07-21 DIAGNOSIS — K861 Other chronic pancreatitis: Secondary | ICD-10-CM

## 2018-07-21 DIAGNOSIS — R300 Dysuria: Secondary | ICD-10-CM | POA: Diagnosis not present

## 2018-07-21 DIAGNOSIS — R112 Nausea with vomiting, unspecified: Secondary | ICD-10-CM

## 2018-07-21 DIAGNOSIS — R109 Unspecified abdominal pain: Secondary | ICD-10-CM | POA: Diagnosis not present

## 2018-07-21 DIAGNOSIS — F331 Major depressive disorder, recurrent, moderate: Secondary | ICD-10-CM

## 2018-07-21 DIAGNOSIS — Z113 Encounter for screening for infections with a predominantly sexual mode of transmission: Secondary | ICD-10-CM | POA: Diagnosis not present

## 2018-07-21 DIAGNOSIS — K909 Intestinal malabsorption, unspecified: Secondary | ICD-10-CM | POA: Diagnosis not present

## 2018-07-21 MED ORDER — FENTANYL 12 MCG/HR TD PT72: 12.5000 ug | MEDICATED_PATCH | TRANSDERMAL | 0 refills | Status: DC

## 2018-07-21 NOTE — Progress Notes (Signed)
[facility] [place], [place] [address]  Internal MEDICINE  Office Visit Note  Patient Name: Stacy Pineda  [id number]  [id number]  Date of Service: 07/21/2018  Chief Complaint  Patient presents with  . Annual Exam    medicare annual well visit      HPI Pt is here for routine health maintenance examination.  She is a well-appearing 37 year old African-American female.  Generally the patient is doing fair.  She has an extensive history which includes chronic abdominal pain, MD, intractable vomiting, peptic ulcer, PTSD, GAD, chronic pancreatitis and iron deficiency anemia.  Patient would like her yearly lab work done to check her vitamin levels as well as her normal yearly labs.  She is also requesting testing for sexually transmitted infections.  She reports that most of her elements are well controlled however she does report that since being diagnosed with peptic ulcer she has continued pain and difficulty swallowing at times.  She also reports that her chronic pancreatitis gives her chronic nausea with any p.o. consumption.  She would like to see a GI doctor for second opinion.  Patient denies any nicotine, alcohol, or illicit drug use.  Current Medication: Outpatient Encounter Medications as of 07/21/2018  Medication Sig Note  . albuterol (VENTOLIN HFA) 108 (90 Base) MCG/ACT inhaler  06/28/2015: Received from: [facility]  . ASMANEX HFA 100 MCG/ACT AERO  06/28/2015: Received from: External Pharmacy  . Butalbital-APAP-Caff-Cod 50-300-40-30 MG CAPS TAKE ONE TAB PO QD PRN FOR PAIN   . clonazePAM (KLONOPIN) 1 MG tablet Take 1 mg by mouth 2 (two) times daily.   Marland Kitchen CREON 36000 UNITS CPEP capsule  06/28/2015: Received from: External Pharmacy  . fentaNYL (DURAGESIC) 12 MCG/HR Place 1 patch (12.5 mcg total) onto the skin every 3 (three) days.   . medroxyPROGESTERone (DEPO-PROVERA) 150 MG/ML injection Inject 1 mL (150 mg total) into the muscle every 3 (three) months.    . mirtazapine (REMERON) 45 MG tablet Take 1 tablet (45 mg total) by mouth at bedtime.   . prazosin (MINIPRESS) 2 MG capsule Take 2 mg by mouth at bedtime.   . pregabalin (LYRICA) 50 MG capsule Take 1 capsule (50 mg total) by mouth 3 (three) times daily.   . promethazine (PHENERGAN) 6.25 MG/5ML syrup Take 10 mLs (12.5 mg total) by mouth every 8 (eight) hours as needed for nausea, vomiting or refractory nausea / vomiting.   . venlafaxine (EFFEXOR) 75 MG tablet Take 75 mg by mouth 2 (two) times daily.   . [DISCONTINUED] fentaNYL (DURAGESIC) 12 MCG/HR Place 1 patch (12.5 mcg total) onto the skin every 3 (three) days.   . [DISCONTINUED] escitalopram (LEXAPRO) 20 MG tablet Take on tab qd with supper and increase as directed (Patient not taking: Reported on 07/21/2018)   . [DISCONTINUED] LORazepam (ATIVAN) 1 MG tablet Take 1 tablet (1 mg total) by mouth every 8 (eight) hours as needed for anxiety (nausea). (Patient not taking: Reported on 07/21/2018)    No facility-administered encounter medications on file as of 07/21/2018.     Surgical History: Past Surgical History:  Procedure Laterality Date  . ABDOMINAL SURGERY    . CHOLECYSTECTOMY    . DECUBITUS ULCER EXCISION    . DILATION AND CURETTAGE OF UTERUS    . GASTRIC BYPASS    . GASTRIC BYPASS    . REVISION GASTRIC RESTRICTIVE PROCEDURE FOR MORBID OBESITY      Medical History: Past Medical History:  Diagnosis Date  . Anxiety   .  Asthma   . Chronic headaches   . Depression   . Pancreatitis   . Pancreatitis   . PTSD (post-traumatic stress disorder)     Family History: Family History  Problem Relation Age of Onset  . Graves' disease Mother   . Hypertension Mother   . Hyperlipidemia Mother   . Anxiety disorder Father   . Prostate cancer Father   . Depression Brother   . Anxiety disorder Brother   . Anxiety disorder Maternal Aunt   . Depression Maternal Aunt   . Anxiety disorder Paternal Aunt   . Depression Paternal Aunt   .  Anxiety disorder Maternal Uncle   . Depression Maternal Uncle   . Anxiety disorder Paternal Uncle   . Depression Paternal Uncle   . Anxiety disorder Maternal Grandfather   . Depression Maternal Grandfather   . Diabetes Maternal Grandfather   . Anxiety disorder Maternal Grandmother   . Depression Maternal Grandmother   . Breast cancer Maternal Grandmother   . Ovarian cancer Maternal Grandmother   . Diabetes Maternal Grandmother   . Colon cancer Neg Hx    Depression screen [facility] 2/9 07/21/2018 05/18/2018 01/20/2018  Decreased Interest 3 3 1   Down, Depressed, Hopeless 3 3 1   PHQ - 2 Score 6 6 2   Altered sleeping 3 3 3   Tired, decreased energy 3 3 3   Change in appetite 3 3 3   Feeling bad or failure about yourself  3 3 1   Trouble concentrating 3 3 1   Moving slowly or fidgety/restless 0 0 0  Suicidal thoughts 0 0 0  PHQ-9 Score 21 21 13   Difficult doing work/chores Very difficult Very difficult -  Some encounter information is confidential and restricted. Go to Review Flowsheets activity to see all data.    Functional Status Survey: Is the patient deaf or have difficulty hearing?: No Does the patient have difficulty seeing, even when wearing glasses/contacts?: No Does the patient have difficulty concentrating, remembering, or making decisions?: No Does the patient have difficulty walking or climbing stairs?: No Does the patient have difficulty dressing or bathing?: No Does the patient have difficulty doing errands alone such as visiting a doctor's office or shopping?: No  MMSE - [place] Exam 07/21/2018  Orientation to time 5  Orientation to Place 5  Registration 3  Attention/ Calculation 5  Recall 3  Language- name 2 objects 2  Language- repeat 1  Language- follow 3 step command 3  Language- read & follow direction 1  Write a sentence 1  Copy design 1  Total score 30    Fall Risk  07/21/2018 06/22/2018 05/18/2018 01/20/2018  Falls in the past year? 0 0 No Yes   Number falls in past yr: - 0 - 2 or more  Injury with Fall? - 0 - No      Review of Systems  Constitutional: Negative for chills, fatigue and unexpected weight change.  HENT: Negative for congestion, rhinorrhea, sneezing and sore throat.   Eyes: Negative for photophobia, pain and redness.  Respiratory: Negative for cough, chest tightness and shortness of breath.   Cardiovascular: Negative for chest pain and palpitations.  Gastrointestinal: Negative for abdominal pain, constipation, diarrhea, nausea and vomiting.  Endocrine: Negative.   Genitourinary: Negative for dysuria and frequency.  Musculoskeletal: Negative for arthralgias, back pain, joint swelling and neck pain.  Skin: Negative for rash.  Allergic/Immunologic: Negative.   Neurological: Negative for tremors and numbness.  Hematological: Negative for adenopathy. Does not bruise/bleed easily.  Psychiatric/Behavioral:  Negative for behavioral problems and sleep disturbance. The patient is not nervous/anxious.      Vital Signs: BP 116/72 (BP Location: Left Arm, Patient Position: Sitting, Cuff Size: Normal)   Pulse 88   Resp 16   Ht 6\' 1"  (1.854 m)   Wt 180 lb (81.6 kg)   SpO2 98%   BMI 23.75 kg/m    Physical Exam  Constitutional: She is oriented to person, place, and time. She appears well-developed and well-nourished. No distress.  HENT:  Head: Normocephalic and atraumatic.  Mouth/Throat: Oropharynx is clear and moist. No oropharyngeal exudate.  Eyes: Pupils are equal, round, and reactive to light. EOM are normal.  Neck: Normal range of motion. Neck supple. No JVD present. No tracheal deviation present. No thyromegaly present.  Cardiovascular: Normal rate, regular rhythm and normal heart sounds. Exam reveals no gallop and no friction rub.  No murmur heard. Pulmonary/Chest: Effort normal and breath sounds normal. No respiratory distress. She has no wheezes. She has no rales. She exhibits no tenderness.  Abdominal:  Soft. There is no tenderness. There is no guarding.  Musculoskeletal: Normal range of motion.  Lymphadenopathy:    She has no cervical adenopathy.  Neurological: She is alert and oriented to person, place, and time. No cranial nerve deficit.  Skin: Skin is warm and dry. She is not diaphoretic.  Psychiatric: She has a normal mood and affect. Her behavior is normal. Judgment and thought content normal.  Nursing note and vitals reviewed.    LABS: Recent Results (from the past 2160 hour(s))  Iron and TIBC     Status: Abnormal   Collection Time: 05/24/18 12:45 PM  Result Value Ref Range   Iron 124 28 - 170 ug/dL   TIBC 335 250 - 450 ug/dL   Saturation Ratios 37 (H) 10.4 - 31.8 %   UIBC 211 ug/dL    Comment: Performed at [facility], [address] [place], [place] [address]  Ferritin     Status: None   Collection Time: 05/24/18 12:45 PM  Result Value Ref Range   Ferritin 35 11 - 307 ng/mL    Comment: Performed at [facility], [address] [place], [place] [address]  CBC with Differential/Platelet     Status: Abnormal   Collection Time: 05/24/18 12:45 PM  Result Value Ref Range   WBC 7.9 4.0 - 10.5 K/uL   RBC 4.53 3.87 - 5.11 MIL/uL   Hemoglobin 10.7 (L) 12.0 - 15.0 g/dL    Comment: Reticulocyte Hemoglobin testing may be clinically indicated, consider ordering this additional test [id number]    HCT 32.9 (L) 36.0 - 46.0 %   MCV 72.6 (L) 80.0 - 100.0 fL   MCH 23.6 (L) 26.0 - 34.0 pg   MCHC 32.5 30.0 - 36.0 g/dL   RDW 18.9 (H) 11.5 - 15.5 %   Platelets 251 150 - 400 K/uL   nRBC 1.9 (H) 0.0 - 0.2 %   Neutrophils Relative % 51 %   Neutro Abs 4.2 1.7 - 7.7 K/uL   Lymphocytes Relative 39 %   Lymphs Abs 3.1 0.7 - 4.0 K/uL   Monocytes Relative 6 %   Monocytes Absolute 0.4 0.1 - 1.0 K/uL   Eosinophils Relative 2 %   Eosinophils Absolute 0.2 0.0 - 0.5 K/uL   Basophils Relative 1 %   Basophils Absolute 0.0 0.0 - 0.1 K/uL   Immature Granulocytes 1 %   Abs  Immature Granulocytes 0.05 0.00 - 0.07 K/uL  Comment: Performed at [facility], [address] [place], [place] [address]  POCT Urine Drug Screen     Status: Abnormal   Collection Time: 06/22/18 12:16 PM  Result Value Ref Range   POC METHAMPHETAMINE UR None Detected None Detected   POC Opiate Ur None Detected None Detected   POC Barbiturate UR None Detected None Detected   POC Amphetamine UR None Detected None Detected   POC Oxycodone UR None Detected None Detected   POC Cocaine UR None Detected None Detected   POC Ecstasy UR None Detected None Detected   POC TRICYCLICS UR Positive (A) None Detected   POC PHENCYCLIDINE UR None Detected None Detected   POC MARIJUANA UR Positive (A) None Detected   POC METHADONE UR None Detected None Detected   POC BENZODIAZEPINES UR None Detected None Detected   URINE TEMPERATURE     POC DRUG SCREEN OXIDANTS URINE     POC SPECIFIC GRAVITY URINE     POC PH URINE     Methylenedioxyamphetamine       Assessment/Plan: 1. Encounter for general adult medical examination with abnormal findings Patient is up-to-date on preventative health maintenance routine lab work ordered.  We will follow-up with labs as results are available. - CBC with Differential/Platelet - Lipid Panel With LDL/HDL Ratio - TSH - T4, free - Comprehensive metabolic panel - Lipase - Amylase  2. Chronic abdominal pain Patient's Duragesic patch is refilled at this time. - fentaNYL (DURAGESIC) 12 MCG/HR; Place 1 patch (12.5 mcg total) onto the skin every 3 (three) days.  Dispense: 10 patch; Refill: 0  3. MDD (major depressive disorder), recurrent episode, moderate ([place]) Patient's major depressive disorder is stable at this time.  She is encouraged to continue to follow-up with psychiatry as planned.  4. Intractable vomiting with nausea, unspecified vomiting type Patient would like another referral for GI.  She reports that she has had an endoscopy and a ulcer in the past  would like to speak with them about possibly repeating her EGD as well as getting a colonoscopy. - Ambulatory referral to Gastroenterology  5. Hx of peptic ulcer Patient has history of peptic ulcer.  She reports that she continues to feel pain when swallowing and is worried that the peptic ulcer has not healed.  Will refer back to gastroneurology. - Ambulatory referral to Gastroenterology  6. PTSD (post-traumatic stress disorder) Patient manages her PTSD well at this time.  She reports that she does not like it staying in her apartment alone.  She is here today with paperwork to request that the section 8 housing authority allow her to have a roommate.  I provided at this time.  7. GAD (generalized anxiety disorder) Stable, patient reports some regular anxiety however most of her medications help her control her symptoms.  8. Chronic pancreatitis, unspecified pancreatitis type [facility]) Patient reports history of chronic pancreatitis.  She would like to see a different GI doctor for a second opinion.  She continues to have some intermittent pain but is been managed well with fentanyl patches at this time.  9. Screening for HIV without presence of risk factors Patient requested HIV screening will provide at this time. - HIV antibody (with reflex)  10. Screen for STD (sexually transmitted disease) Patient requested sexually transmitted disease screening provided at this visit. - HSV(herpes simplex vrs) 1+2 ab-IgG - RPR - Chlamydia/Gonococcus/Trichomonas, NAA  11. Other iron deficiency anemia We will check patient's labs follow-up as needed. - CBC with Differential/Platelet  12. Abnormal intestinal absorption Will check patient's B12 and folate level and follow-up as needed. - B12 and Folate Panel  13. Vitamin D deficiency Check vitamin D level. - Vitamin D 1,25 dihydroxy  14. Dysuria - UA/M w/rflx Culture, Routine  General Counseling: Laelynn verbalizes understanding of the findings  of todays visit and agrees with plan of treatment. I have discussed any further diagnostic evaluation that may be needed or ordered today. We also reviewed her medications today. she has been encouraged to call the office with any questions or concerns that should arise related to todays visit.   Orders Placed This Encounter  Procedures  . Chlamydia/Gonococcus/Trichomonas, NAA  . UA/M w/rflx Culture, Routine  . CBC with Differential/Platelet  . Lipid Panel With LDL/HDL Ratio  . TSH  . T4, free  . Comprehensive metabolic panel  . B12 and Folate Panel  . Vitamin D 1,25 dihydroxy  . HIV antibody (with reflex)  . HSV(herpes simplex vrs) 1+2 ab-IgG  . Lipase  . Amylase  . RPR  . Ambulatory referral to Gastroenterology    Meds ordered this encounter  Medications  . fentaNYL (DURAGESIC) 12 MCG/HR    Sig: Place 1 patch (12.5 mcg total) onto the skin every 3 (three) days.    Dispense:  10 patch    Refill:  0    Time spent: 35 Minutes   This patient was seen by Orson Gear AGNP-C in Collaboration with Dr Lavera Guise as a part of collaborative care agreement    Kendell Bane AGNP-C Internal Medicine

## 2018-07-24 LAB — UA/M W/RFLX CULTURE, ROUTINE
BILIRUBIN UA: NEGATIVE
Glucose, UA: NEGATIVE
Leukocytes, UA: NEGATIVE
NITRITE UA: NEGATIVE
PH UA: 5.5 (ref 5.0–7.5)
RBC, UA: NEGATIVE
UUROB: 1 mg/dL (ref 0.2–1.0)

## 2018-07-24 LAB — MICROSCOPIC EXAMINATION
Casts: NONE SEEN /lpf
Epithelial Cells (non renal): >10 /hpf — AB (ref 0–10)

## 2018-07-24 LAB — CHLAMYDIA/GONOCOCCUS/TRICHOMONAS, NAA
Chlamydia by NAA: NEGATIVE
GONOCOCCUS BY NAA: NEGATIVE
Trich vag by NAA: NEGATIVE

## 2018-08-06 ENCOUNTER — Ambulatory Visit: Payer: Self-pay

## 2018-08-10 NOTE — Progress Notes (Signed)
Last depo inj: 05/21/18 UPT: N/A Side effects: none Next Depo- Provera injection due: 3/20-11/12/18 Annual exam due: Last pap was 03/30/15. Due now.

## 2018-08-12 ENCOUNTER — Ambulatory Visit (INDEPENDENT_AMBULATORY_CARE_PROVIDER_SITE_OTHER): Payer: Medicare Other | Admitting: Certified Nurse Midwife

## 2018-08-12 VITALS — BP 126/89 | HR 83 | Ht 73.0 in | Wt 177.3 lb

## 2018-08-12 DIAGNOSIS — Z3042 Encounter for surveillance of injectable contraceptive: Secondary | ICD-10-CM | POA: Diagnosis not present

## 2018-08-12 MED ORDER — MEDROXYPROGESTERONE ACETATE 150 MG/ML IM SUSP
150.0000 mg | Freq: Once | INTRAMUSCULAR | Status: AC
  Administered 2018-08-12: 150 mg via INTRAMUSCULAR

## 2018-08-12 NOTE — Progress Notes (Signed)
I have reviewed the record and concur with patient management and plan of care.    Diona Fanti, CNM [facility], [facility] 08/12/18 11:23 AM

## 2018-08-16 DIAGNOSIS — F410 Panic disorder [episodic paroxysmal anxiety] without agoraphobia: Secondary | ICD-10-CM | POA: Diagnosis not present

## 2018-08-16 DIAGNOSIS — F4312 Post-traumatic stress disorder, chronic: Secondary | ICD-10-CM | POA: Diagnosis not present

## 2018-08-16 DIAGNOSIS — F332 Major depressive disorder, recurrent severe without psychotic features: Secondary | ICD-10-CM | POA: Diagnosis not present

## 2018-08-17 ENCOUNTER — Encounter: Payer: Self-pay | Admitting: Gastroenterology

## 2018-08-17 ENCOUNTER — Ambulatory Visit (INDEPENDENT_AMBULATORY_CARE_PROVIDER_SITE_OTHER): Payer: Medicare Other | Admitting: Gastroenterology

## 2018-08-17 VITALS — BP 136/96 | HR 106 | Ht 73.0 in | Wt 178.8 lb

## 2018-08-17 DIAGNOSIS — R109 Unspecified abdominal pain: Secondary | ICD-10-CM

## 2018-08-17 DIAGNOSIS — R112 Nausea with vomiting, unspecified: Secondary | ICD-10-CM | POA: Diagnosis not present

## 2018-08-17 DIAGNOSIS — R197 Diarrhea, unspecified: Secondary | ICD-10-CM

## 2018-08-17 DIAGNOSIS — Z9884 Bariatric surgery status: Secondary | ICD-10-CM

## 2018-08-17 NOTE — Progress Notes (Signed)
Stacy Pineda [address]  [place], [place] [address]  Main: [phone]  Fax: [phone]   Gastroenterology Consultation  Referring Provider:     Kendell Bane, NP Primary Care Physician:  Ronnell Freshwater, NP Reason for Consultation:     Nausea, vomiting, abdominal pain        HPI:    Chief Complaint  Patient presents with  . New Patient (Initial Visit)    intractable vomiting with nausea, hx peptic ulcer    Stacy Pineda is a 38 y.o. y/o female referred for consultation & management  by Dr. Ronnell Freshwater, NP.  Patient with history of gastric bypass in 2012, previously followed by [facility], with reversal in 2015 referred for intermittent nausea vomiting and abdominal pain.  Patient has had a complicated history since her gastric bypass, and as of the most recent GI interventions, had EGD with Botox injections at the pylorus due to intermittent nausea vomiting.  Patient reports eating 6 small meals a day, and intermittent vomiting 2-3 times a day, without hematemesis.  No recent weight loss.  Also reports chronic loose stools reports 10-15 loose bowel movements a day, with recent bright red blood per rectum that she attributes to hemorrhoids.  Also has history of gallstone pancreatitis after her gastric bypass and pancreatic stent placement and removal since then.  Was previously on Creon and is not on it anymore.  Reports intermittent abdominal pain, dull, nonradiating, 5/10.  Is on chronic pain medications.  Has had previous celiac plexus block for pain management as well.  As per previous notes in care everywhere: - S/p laparoscopic reversal of gastric bypass/partial gastrectomy 2015, secondary to chronic recurrent marginal ulcer  - S/p Roux-en-Y 2012, gastrojejunal anastomotic revision with vagotomy in 2014 secondary to chronic nonhealing ulcer - S/p cholecystectomy, and transgastric ERCP for CBD stone in the setting of gallstone pancreatitis  2017 EGD  by Dr. Ephriam Knuckles Findings:    The examined esophagus was normal.    Evidence of a gastroplasty were found in the gastric body. This was     characterized by healthy appearing mucosa.    A benign-appearing, intrinsic stenosis was found at the pylorus. This     was traversed. Area was successfully injected with 100 units botulinum     toxin.    The examined duodenum was normal.  As per GI clinic note from [facility] from April 2019  "She had a RYGB by Dr. Cletus Gash in 05/2011.She then developed gallstone pancreatitis (presumably) in 11/2011. She had a cholecystectomy and later had a transgastric ERCP by Dr. Kallie Locks at [facility] in May-June 2013. She was then diagnosed with a marginal ulcer in July 2013. Ultimately the GEJ was revised and the ulcer was resected 08/18/2012. She then had a recurrent marginal ulcer and had a gastric bypass reversal on October, 2015. She has had a feeding J-tube since then to help with nutrition during the flare ups of pancreatitis. The tube is not currently in place and she has been able to maintain her weight with PO intake. She has been managed by Dr. Satira Mccallum at [facility] with regards to her pain management - however has not showed up to her last appointments and she thinks she has been dismissed from their practice. She has had 3 celiac plexus blocks without much improvement."  April 2019 EGD by Dr. Hervey Ard:  Findings:    The examined esophagus was normal.    One non-bleeding superficial gastric ulcer with  no stigmata of bleeding     was found in the gastric antrum. The lesion was 10 mm in largest     dimension. The antrum was biopsied for H. pylori testing    The examined duodenum was normal. 100 units of botox was injected into     the pylorus as it appeared spasmed.   Previous gastric emptying studies:  June 2017 FINDINGS: Gastric retention is as follows; 93% at 10 minutes, 76% at 20 minutes, 60% at 30 minutes, 30% at 40 minutes. .     IMPRESSION: There is no evidence for delayed gastric emptying. Gastric emptying is faster than typical, possibly reflection of patient's gastric bypass.    April 2016 FINDINGS: Gastric emptying was determined following oral ingestion of 1.0 millicuries of technetium labeled sulfur colloid mixed in eggs with toast  At 10 minutes after ingestion, 73 percent of activity remains within the stomach; at 30 minutes, 65 percent remains; at 50 minutes, 65 percent remains; and at 80 minutes, 50 percent of activity remains within the stomach.  IMPRESSION: Initially, normal gastric emptying with a mild degree of delayed emptying at the later time points.   November 2015 FINDINGS: The gastric emptying curve is well within normal limits. There is no evidence for delayed emptying.  IMPRESSION: Normal. No evidence for delayed emptying.   Colonoscopy 2015 By Dr. Gustavo Lah for abdominal pain and diarrhea Reported diverticulosis and random colon biopsies did not show any pathologic changes  Past Medical History:  Diagnosis Date  . Anxiety   . Asthma   . Chronic headaches   . Depression   . Pancreatitis   . Pancreatitis   . PTSD (post-traumatic stress disorder)     Past Surgical History:  Procedure Laterality Date  . ABDOMINAL SURGERY    . CHOLECYSTECTOMY    . DECUBITUS ULCER EXCISION    . DILATION AND CURETTAGE OF UTERUS    . GASTRIC BYPASS    . GASTRIC BYPASS    . REVISION GASTRIC RESTRICTIVE PROCEDURE FOR MORBID OBESITY      Prior to Admission medications   Medication Sig Start Date End Date Taking? Authorizing Provider  albuterol (VENTOLIN HFA) 108 (90 Base) MCG/ACT inhaler  06/18/15  Yes [provider]  [facility] HFA 100 MCG/ACT AERO  06/19/15  Yes [provider]  Butalbital-APAP-Caff-Cod 50-300-40-30 MG CAPS TAKE ONE TAB PO QD PRN FOR PAIN 01/20/18  Yes Boscia, Heather E, NP  clonazePAM (KLONOPIN) 1 MG tablet Take 1 mg by mouth 2 (two) times daily.   Yes [provider]  fentaNYL (DURAGESIC) 12 MCG/HR Place 1 patch (12.5 mcg total) onto the skin every 3 (three) days. 07/21/18  Yes Kendell Bane, NP  medroxyPROGESTERone (DEPO-PROVERA) 150 MG/ML injection Inject 1 mL (150 mg total) into the muscle every 3 (three) months. 12/25/17  Yes Lawhorn, Lara Mulch, CNM  mirtazapine (REMERON) 45 MG tablet Take 1 tablet (45 mg total) by mouth at bedtime. 04/08/18 04/08/19 Yes Eksir, Richard Miu, MD  prazosin (MINIPRESS) 2 MG capsule Take 2 mg by mouth at bedtime.   Yes [provider]  pregabalin (LYRICA) 50 MG capsule Take 1 capsule (50 mg total) by mouth 3 (three) times daily. 05/18/18  Yes Lavera Guise, MD  venlafaxine (EFFEXOR) 75 MG tablet Take 75 mg by mouth 2 (two) times daily.   Yes [provider]  CREON 36000 UNITS CPEP capsule  06/19/15   [provider]    Family History  Problem Relation Age  of Onset  . Graves' disease Mother   . Hypertension Mother   . Hyperlipidemia Mother   . Anxiety disorder Father   . Prostate cancer Father   . Depression Brother   . Anxiety disorder Brother   . Anxiety disorder Maternal Aunt   . Depression Maternal Aunt   . Anxiety disorder Paternal Aunt   . Depression Paternal Aunt   . Anxiety disorder Maternal Uncle   . Depression Maternal Uncle   . Anxiety disorder Paternal Uncle   . Depression Paternal Uncle   . Anxiety disorder Maternal Grandfather   . Depression Maternal Grandfather   . Diabetes Maternal Grandfather   . Anxiety disorder Maternal Grandmother   . Depression Maternal Grandmother   . Breast cancer Maternal Grandmother   . Ovarian cancer Maternal Grandmother   . Diabetes Maternal Grandmother   . Colon cancer Neg Hx      Social History   Tobacco Use  . Smoking status: Current Some Day Smoker    Packs/day: 0.25    Types: Cigars, E-cigarettes, Cigarettes    Start date: 03/26/2005  . Smokeless tobacco: Never Used  . Tobacco comment: pt smoke weekly    Substance Use Topics  . Alcohol use: No    Alcohol/week: 0.0 standard drinks  . Drug use: Yes    Frequency: 3.0 times per week    Types: Marijuana    Comment: MJ use weekly    Allergies as of 08/17/2018 - Review Complete 08/17/2018  Allergen Reaction Noted  . Fentanyl Itching 03/27/2015  . Morphine and related Itching 03/27/2015  . Vicodin [hydrocodone-acetaminophen] Itching 03/27/2015    Review of Systems:    All systems reviewed and negative except where noted in HPI.   Physical Exam:  BP (!) 136/96   Pulse (!) 106   Ht 6\' 1"  (1.854 m)   Wt 178 lb 12.8 oz (81.1 kg)   BMI 23.59 kg/m  No LMP recorded. Patient has had an injection. Psych:  Alert and cooperative. Normal mood and affect. General:   Alert,  Well-developed, well-nourished, pleasant and cooperative in NAD Head:  Normocephalic and atraumatic. Eyes:  Sclera clear, no icterus.   Conjunctiva pink. Ears:  Normal auditory acuity. Nose:  No deformity, discharge, or lesions. Mouth:  No deformity or lesions,oropharynx pink & moist. Neck:  Supple; no masses or thyromegaly. Abdomen:  Normal bowel sounds.  No bruits.  Soft, non-tender and non-distended without masses, hepatosplenomegaly or hernias noted.  No guarding or rebound tenderness.    Msk:  Symmetrical without gross deformities. Good, equal movement & strength bilaterally. Pulses:  Normal pulses noted. Extremities:  No clubbing or edema.  No cyanosis. Neurologic:  Alert and oriented x3;  grossly normal neurologically. Skin:  Intact without significant lesions or rashes. No jaundice. Lymph Nodes:  No significant cervical adenopathy. Psych:  Alert and cooperative. Normal mood and affect.   Labs: CBC    Component Value Date/Time   WBC 7.9 05/24/2018 1245   RBC 4.53 05/24/2018 1245   HGB 10.7 (L) 05/24/2018 1245   HGB 10.1 (L) 07/07/2014 0415   HCT 32.9 (L) 05/24/2018 1245   HCT 33.2 (L) 07/07/2014 0415   PLT 251 05/24/2018 1245   PLT 249 07/07/2014 0415    MCV 72.6 (L) 05/24/2018 1245   MCV 64 (L) 07/07/2014 0415   MCH 23.6 (L) 05/24/2018 1245   MCHC 32.5 05/24/2018 1245   RDW 18.9 (H) 05/24/2018 1245   RDW 22.0 (H) 07/07/2014 0415   LYMPHSABS  3.1 05/24/2018 1245   LYMPHSABS 0.8 (L) 07/07/2014 0415   MONOABS 0.4 05/24/2018 1245   MONOABS 0.2 07/07/2014 0415   EOSABS 0.2 05/24/2018 1245   EOSABS 0.0 07/07/2014 0415   BASOSABS 0.0 05/24/2018 1245   BASOSABS 0.0 07/07/2014 0415   CMP     Component Value Date/Time   NA 142 04/07/2016 1645   NA 138 07/07/2014 0415   K 3.1 (L) 04/07/2016 1645   K 3.6 07/07/2014 0415   CL 118 (H) 04/07/2016 1645   CL 102 07/07/2014 0415   CO2 18 (L) 04/07/2016 1645   CO2 28 07/07/2014 0415   GLUCOSE 86 04/07/2016 1645   GLUCOSE 156 (H) 07/07/2014 0415   BUN 13 04/07/2016 1645   BUN 10 07/07/2014 0415   CREATININE 0.46 04/07/2016 1645   CREATININE 0.72 07/07/2014 0415   CALCIUM 8.5 (L) 04/07/2016 1645   CALCIUM 9.5 07/07/2014 0415   PROT 6.3 (L) 04/07/2016 1645   PROT 8.4 (H) 07/07/2014 0415   ALBUMIN 3.6 04/07/2016 1645   ALBUMIN 3.9 07/07/2014 0415   AST 16 04/07/2016 1645   AST 13 (L) 07/07/2014 0415   ALT 14 04/07/2016 1645   ALT 34 07/07/2014 0415   ALKPHOS 79 04/07/2016 1645   ALKPHOS 113 07/07/2014 0415   BILITOT 0.4 04/07/2016 1645   BILITOT 0.5 07/07/2014 0415   GFRNONAA >60 04/07/2016 1645   GFRNONAA >60 07/07/2014 0415   GFRNONAA >60 10/31/2013 1048   GFRAA >60 04/07/2016 1645   GFRAA >60 07/07/2014 0415   GFRAA >60 10/31/2013 1048    Imaging Studies: No results found.  Assessment and Plan:   Stacy Pineda is a 38 y.o. y/o female has been referred for intermittent nausea vomiting, abdominal pain and diarrhea, with history of gastric bypass in 2012, with reversal in 2015 due to chronic ulcers, history of gallstone pancreatitis requiring transgastric ERCP and cholecystectomy in 2013, with history of Botox injections to the pylorus at [facility] due to pyloric stenosis reported  during the EGD, and last EGD also reporting gastric antral ulcer in April 2019  Patient has not had an EGD since April 2019 to evaluate previously seen ulcer Her gastric emptying study was last in 2017 and was normal The etiology of her nausea and vomiting needs to be determined with repeat EGD to evaluate if there is a persistent ulcer at the site or if there is pyloric stenosis She also reports mild intermittent dysphagia for which we will obtain biopsies for EOE and evaluate for stricture during the EGD  She will likely need a gastric emptying study prior to any further Botox injections.  She states the Botox injections only helped her for 7 to 10 days and symptoms then reoccurred.  She is not on any PPI at this time  She was previously on Creon but she states it was not helping and she was taking 72,000 units with meals and 36,000 units with snacks.  She is not on them currently.  I did discuss with her that marijuana use can cause cyclical vomiting and have encouraged her to abstain from marijuana use or any other drugs.  She verbalized understanding.  She reports altered bowel habits and intermittent bright red blood per rectum, and would like a colonoscopy done to evaluate this further.  We will schedule for EGD and colonoscopy to evaluate her symptoms further and obtain biopsies of the colon to rule out microscopic colitis  I did discuss with her that if her findings during her  procedures are complicated that she may need to be referred to [facility] again, and offered if she would like to have her procedures done at [facility] instead of our facility instead, to allow for continuity of care and continuation of their previous plan.  She would like to have the procedures done here and understands that if there are complicated findings that cannot be addressed at our facility that she may need to be referred back to [facility] and have repeat procedures done there depending on the findings on our  procedures.  Given her complicated past medical history, I extensively reviewed her previous notes, procedure reports, CT reports.  The entire evaluation took over 60 minutes, with over 40 minutes spent discussing patient's symptoms, and counseling the patient and developing a treatment plan with her.  I have discussed alternative options, risks & benefits,  which include, but are not limited to, bleeding, infection, perforation,respiratory complication & drug reaction.  The patient agrees with this plan & written consent will be obtained.     Dr Stacy Pineda  Speech recognition software was used to dictate the above note.

## 2018-08-17 NOTE — Addendum Note (Signed)
Addended by: Earl Lagos on: 08/17/2018 11:26 AM   Modules accepted: Orders, SmartSet

## 2018-08-19 DIAGNOSIS — Z0001 Encounter for general adult medical examination with abnormal findings: Secondary | ICD-10-CM | POA: Diagnosis not present

## 2018-08-19 DIAGNOSIS — E756 Lipid storage disorder, unspecified: Secondary | ICD-10-CM | POA: Diagnosis not present

## 2018-08-19 DIAGNOSIS — Z13220 Encounter for screening for lipoid disorders: Secondary | ICD-10-CM | POA: Diagnosis not present

## 2018-08-19 DIAGNOSIS — K909 Intestinal malabsorption, unspecified: Secondary | ICD-10-CM | POA: Diagnosis not present

## 2018-08-19 DIAGNOSIS — E0781 Sick-euthyroid syndrome: Secondary | ICD-10-CM | POA: Diagnosis not present

## 2018-08-19 DIAGNOSIS — Z114 Encounter for screening for human immunodeficiency virus [HIV]: Secondary | ICD-10-CM | POA: Diagnosis not present

## 2018-08-19 DIAGNOSIS — D508 Other iron deficiency anemias: Secondary | ICD-10-CM | POA: Diagnosis not present

## 2018-08-19 DIAGNOSIS — E559 Vitamin D deficiency, unspecified: Secondary | ICD-10-CM | POA: Diagnosis not present

## 2018-08-19 DIAGNOSIS — E079 Disorder of thyroid, unspecified: Secondary | ICD-10-CM | POA: Diagnosis not present

## 2018-08-19 DIAGNOSIS — Z113 Encounter for screening for infections with a predominantly sexual mode of transmission: Secondary | ICD-10-CM | POA: Diagnosis not present

## 2018-08-23 LAB — CHLAMYDIA/GONOCOCCUS/TRICHOMONAS, NAA
Chlamydia by NAA: NEGATIVE
Gonococcus by NAA: NEGATIVE
Trich vag by NAA: NEGATIVE

## 2018-08-24 LAB — COMPREHENSIVE METABOLIC PANEL
ALT: 24 IU/L (ref 0–32)
AST: 19 IU/L (ref 0–40)
Albumin/Globulin Ratio: 2 (ref 1.2–2.2)
Albumin: 4.5 g/dL (ref 3.5–5.5)
Alkaline Phosphatase: 145 IU/L — ABNORMAL HIGH (ref 39–117)
BUN/Creatinine Ratio: 15 (ref 9–23)
BUN: 9 mg/dL (ref 6–20)
Bilirubin Total: 0.4 mg/dL (ref 0.0–1.2)
CO2: 19 mmol/L — ABNORMAL LOW (ref 20–29)
Calcium: 8.8 mg/dL (ref 8.7–10.2)
Chloride: 108 mmol/L — ABNORMAL HIGH (ref 96–106)
Creatinine, Ser: 0.59 mg/dL (ref 0.57–1.00)
GFR calc Af Amer: 134 mL/min/{1.73_m2} (ref >59)
GFR calc non Af Amer: 116 mL/min/{1.73_m2} (ref >59)
Globulin, Total: 2.2 g/dL (ref 1.5–4.5)
Glucose: 84 mg/dL (ref 65–99)
Potassium: 4.2 mmol/L (ref 3.5–5.2)
SODIUM: 142 mmol/L (ref 134–144)
Total Protein: 6.7 g/dL (ref 6.0–8.5)

## 2018-08-24 LAB — LIPID PANEL WITH LDL/HDL RATIO
Cholesterol, Total: 130 mg/dL (ref 100–199)
HDL: 58 mg/dL (ref >39)
LDL Calculated: 56 mg/dL (ref 0–99)
LDL/HDL RATIO: 1 ratio (ref 0.0–3.2)
Triglycerides: 82 mg/dL (ref 0–149)
VLDL Cholesterol Cal: 16 mg/dL (ref 5–40)

## 2018-08-24 LAB — CBC WITH DIFFERENTIAL/PLATELET
BASOS ABS: 0 10*3/uL (ref 0.0–0.2)
BASOS: 1 %
EOS (ABSOLUTE): 0.2 10*3/uL (ref 0.0–0.4)
Eos: 3 %
HEMATOCRIT: 40.5 % (ref 34.0–46.6)
Hemoglobin: 12.3 g/dL (ref 11.1–15.9)
Immature Grans (Abs): 0.1 10*3/uL (ref 0.0–0.1)
Immature Granulocytes: 1 %
LYMPHS ABS: 2.7 10*3/uL (ref 0.7–3.1)
LYMPHS: 36 %
MCH: 24.6 pg — AB (ref 26.6–33.0)
MCHC: 30.4 g/dL — AB (ref 31.5–35.7)
MCV: 81 fL (ref 79–97)
MONOS ABS: 0.4 10*3/uL (ref 0.1–0.9)
Monocytes: 5 %
NRBC: 1 % — ABNORMAL HIGH (ref 0–0)
Neutrophils Absolute: 4.1 10*3/uL (ref 1.4–7.0)
Neutrophils: 54 %
PLATELETS: 267 10*3/uL (ref 150–450)
RBC: 5.01 x10E6/uL (ref 3.77–5.28)
RDW: 18.1 % — AB (ref 11.7–15.4)
WBC: 7.5 10*3/uL (ref 3.4–10.8)

## 2018-08-24 LAB — VITAMIN D 1,25 DIHYDROXY
Vitamin D 1, 25 (OH)2 Total: 51 pg/mL
Vitamin D2 1, 25 (OH)2: <10 pg/mL
Vitamin D3 1, 25 (OH)2: 49 pg/mL

## 2018-08-24 LAB — AMYLASE: Amylase: 140 U/L — ABNORMAL HIGH (ref 31–124)

## 2018-08-24 LAB — TSH: TSH: 0.76 u[IU]/mL (ref 0.450–4.500)

## 2018-08-24 LAB — RPR: RPR Ser Ql: NONREACTIVE

## 2018-08-24 LAB — LIPASE: Lipase: 107 U/L — ABNORMAL HIGH (ref 14–72)

## 2018-08-24 LAB — HIV ANTIBODY (ROUTINE TESTING W REFLEX): HIV Screen 4th Generation wRfx: NONREACTIVE

## 2018-08-24 LAB — B12 AND FOLATE PANEL
FOLATE: 2.3 ng/mL — AB (ref >3.0)
Vitamin B-12: 210 pg/mL — ABNORMAL LOW (ref 232–1245)

## 2018-08-24 LAB — HSV(HERPES SIMPLEX VRS) I + II AB-IGG
HSV 1 Glycoprotein G Ab, IgG: <0.91 index (ref 0.00–0.90)
HSV 2 IgG, Type Spec: >23.6 index — ABNORMAL HIGH (ref 0.00–0.90)

## 2018-08-24 LAB — T4, FREE: Free T4: 0.97 ng/dL (ref 0.82–1.77)

## 2018-09-07 ENCOUNTER — Encounter: Payer: Self-pay | Admitting: *Deleted

## 2018-09-08 ENCOUNTER — Encounter
Admission: RE | Disposition: A | Discharge status: Home / Self Care | Payer: Self-pay | Source: Ambulatory Visit | Attending: Gastroenterology

## 2018-09-08 ENCOUNTER — Ambulatory Visit: Payer: Medicare Other | Admitting: Anesthesiology

## 2018-09-08 ENCOUNTER — Ambulatory Visit
Admission: RE | Admit: 2018-09-08 | Discharge: 2018-09-08 | Disposition: A | Discharge status: Home / Self Care | Payer: Medicare Other | Source: Ambulatory Visit | Attending: Gastroenterology | Admitting: Gastroenterology

## 2018-09-08 ENCOUNTER — Encounter: Payer: Self-pay | Admitting: *Deleted

## 2018-09-08 DIAGNOSIS — Z9889 Other specified postprocedural states: Secondary | ICD-10-CM | POA: Insufficient documentation

## 2018-09-08 DIAGNOSIS — R197 Diarrhea, unspecified: Secondary | ICD-10-CM

## 2018-09-08 DIAGNOSIS — R1110 Vomiting, unspecified: Secondary | ICD-10-CM

## 2018-09-08 DIAGNOSIS — R112 Nausea with vomiting, unspecified: Secondary | ICD-10-CM

## 2018-09-08 DIAGNOSIS — Z79899 Other long term (current) drug therapy: Secondary | ICD-10-CM | POA: Insufficient documentation

## 2018-09-08 DIAGNOSIS — R1310 Dysphagia, unspecified: Secondary | ICD-10-CM

## 2018-09-08 DIAGNOSIS — F17290 Nicotine dependence, other tobacco product, uncomplicated: Secondary | ICD-10-CM | POA: Diagnosis not present

## 2018-09-08 DIAGNOSIS — F419 Anxiety disorder, unspecified: Secondary | ICD-10-CM | POA: Insufficient documentation

## 2018-09-08 DIAGNOSIS — F329 Major depressive disorder, single episode, unspecified: Secondary | ICD-10-CM | POA: Diagnosis not present

## 2018-09-08 DIAGNOSIS — R1319 Other dysphagia: Secondary | ICD-10-CM

## 2018-09-08 DIAGNOSIS — K648 Other hemorrhoids: Secondary | ICD-10-CM

## 2018-09-08 DIAGNOSIS — I10 Essential (primary) hypertension: Secondary | ICD-10-CM | POA: Insufficient documentation

## 2018-09-08 DIAGNOSIS — K2950 Unspecified chronic gastritis without bleeding: Secondary | ICD-10-CM | POA: Insufficient documentation

## 2018-09-08 DIAGNOSIS — Z86718 Personal history of other venous thrombosis and embolism: Secondary | ICD-10-CM | POA: Diagnosis not present

## 2018-09-08 DIAGNOSIS — F17210 Nicotine dependence, cigarettes, uncomplicated: Secondary | ICD-10-CM | POA: Diagnosis not present

## 2018-09-08 DIAGNOSIS — J45909 Unspecified asthma, uncomplicated: Secondary | ICD-10-CM | POA: Diagnosis not present

## 2018-09-08 DIAGNOSIS — K649 Unspecified hemorrhoids: Secondary | ICD-10-CM | POA: Diagnosis not present

## 2018-09-08 DIAGNOSIS — K625 Hemorrhage of anus and rectum: Secondary | ICD-10-CM | POA: Diagnosis not present

## 2018-09-08 DIAGNOSIS — R109 Unspecified abdominal pain: Secondary | ICD-10-CM

## 2018-09-08 HISTORY — PX: ESOPHAGOGASTRODUODENOSCOPY (EGD) WITH PROPOFOL: SHX5813

## 2018-09-08 HISTORY — PX: COLONOSCOPY WITH PROPOFOL: SHX5780

## 2018-09-08 LAB — POCT PREGNANCY, URINE: Preg Test, Ur: NEGATIVE

## 2018-09-08 SURGERY — COLONOSCOPY WITH PROPOFOL
Anesthesia: General

## 2018-09-08 MED ORDER — PHENYLEPHRINE HCL 10 MG/ML IJ SOLN
INTRAMUSCULAR | Status: DC | PRN
  Administered 2018-09-08: 80 ug via INTRAVENOUS

## 2018-09-08 MED ORDER — MIDAZOLAM HCL 2 MG/2ML IJ SOLN
INTRAMUSCULAR | Status: DC | PRN
  Administered 2018-09-08: 2 mg via INTRAVENOUS

## 2018-09-08 MED ORDER — LIDOCAINE HCL (CARDIAC) PF 100 MG/5ML IV SOSY
PREFILLED_SYRINGE | INTRAVENOUS | Status: DC | PRN
  Administered 2018-09-08: 70 mg via INTRAVENOUS

## 2018-09-08 MED ORDER — PROPOFOL 10 MG/ML IV BOLUS
INTRAVENOUS | Status: AC
  Filled 2018-09-08: qty 20

## 2018-09-08 MED ORDER — PROPOFOL 10 MG/ML IV BOLUS
INTRAVENOUS | Status: DC | PRN
  Administered 2018-09-08: 40 mg via INTRAVENOUS
  Administered 2018-09-08: 10 mg via INTRAVENOUS
  Administered 2018-09-08: 30 mg via INTRAVENOUS
  Administered 2018-09-08: 40 mg via INTRAVENOUS
  Administered 2018-09-08: 60 mg via INTRAVENOUS
  Administered 2018-09-08: 40 mg via INTRAVENOUS
  Administered 2018-09-08: 20 mg via INTRAVENOUS
  Administered 2018-09-08: 10 mg via INTRAVENOUS
  Administered 2018-09-08 (×3): 20 mg via INTRAVENOUS

## 2018-09-08 MED ORDER — SODIUM CHLORIDE 0.9 % IV SOLN
INTRAVENOUS | Status: DC
  Administered 2018-09-08: 08:00:00 via INTRAVENOUS

## 2018-09-08 MED ORDER — PROPOFOL 500 MG/50ML IV EMUL
INTRAVENOUS | Status: AC
  Filled 2018-09-08: qty 50

## 2018-09-08 MED ORDER — PROPOFOL 500 MG/50ML IV EMUL
INTRAVENOUS | Status: DC | PRN
  Administered 2018-09-08: 140 ug/kg/min via INTRAVENOUS

## 2018-09-08 MED ORDER — MIDAZOLAM HCL 2 MG/2ML IJ SOLN
INTRAMUSCULAR | Status: AC
  Filled 2018-09-08: qty 2

## 2018-09-08 NOTE — Anesthesia Post-op Follow-up Note (Signed)
Anesthesia QCDR form completed.        

## 2018-09-08 NOTE — Anesthesia Postprocedure Evaluation (Signed)
Anesthesia Post Note  Patient: Nallely Martinique  Procedure(s) Performed: COLONOSCOPY WITH PROPOFOL (N/A ) ESOPHAGOGASTRODUODENOSCOPY (EGD) WITH PROPOFOL (N/A )  Patient location during evaluation: PACU Anesthesia Type: General Level of consciousness: awake and alert Pain management: pain level controlled Vital Signs Assessment: post-procedure vital signs reviewed and stable Respiratory status: spontaneous breathing, nonlabored ventilation, respiratory function stable and patient connected to nasal cannula oxygen Cardiovascular status: blood pressure returned to baseline and stable Postop Assessment: no apparent nausea or vomiting Anesthetic complications: no     Last Vitals:  Vitals:   09/08/18 0902 09/08/18 0912  BP: 120/81 120/84  Pulse: 71 65  Resp: (!) 23 18  Temp:    SpO2: 99% 100%    Last Pain:  Vitals:   09/08/18 0852  TempSrc: Tympanic  PainSc: [place]

## 2018-09-08 NOTE — Op Note (Signed)
[facility] Gastroenterology Patient Name: Stacy Pineda Procedure Date: 09/08/2018 8:09 AM MRN: [id number] Account #: [id number] Date of Birth: [birth year] Admit Type: Outpatient Age: 38 Room: [facility] ENDO ROOM 1 Gender: Female Note Status: Finalized Procedure:            Upper GI endoscopy Indications:          Dysphagia, Nausea with vomiting Providers:            Navada Osterhout B. Bonna Gains MD, MD Medicines:            Monitored Anesthesia Care Complications:        No immediate complications. Procedure:            Pre-Anesthesia Assessment:                       - The risks and benefits of the procedure and the                        sedation options and risks were discussed with the                        patient. All questions were answered and informed                        consent was obtained.                       - Patient identification and proposed procedure were                        verified prior to the procedure.                       - ASA Grade Assessment: II - A patient with mild                        systemic disease.                       After obtaining informed consent, the endoscope was                        passed under direct vision. Throughout the procedure,                        the patient's blood pressure, pulse, and oxygen                        saturations were monitored continuously. The Endoscope                        was introduced through the mouth, and advanced to the                        third part of duodenum. The upper GI endoscopy was                        accomplished with ease. The patient tolerated the                        procedure well. Findings:  The gastroesophageal junction and examined esophagus were normal.       Biopsies were obtained from the proximal and distal esophagus with cold       forceps for histology of suspected eosinophilic esophagitis.      Evidence ofa, history of gastric bypass and reversal  with sutures, were       found in the stomach. This was characterized by healthy appearing       mucosa and the presence of no stomal ulceration.      There is no endoscopic evidence of ulceration, stenosis or stricture in       the entire examined stomach. Biopsies were taken with a cold forceps for       Helicobacter pylori testing.      The examined duodenum was normal. There was no narrowing seen at the       opening. The opening to the small bowel was present just past the area       of the sutures seen in the stomach. Impression:           - Normal gastroesophageal junction and esophagus.                        Biopsied.                       - A, history of gastric bypass and reversal with                        sutures, were found, characterized by no stomal                        ulceration and healthy appearing mucosa.                       - Normal examined duodenum. Recommendation:       - Return to GI clinic as previously scheduled.                       - We will need to obtain and review her previous                        operative notes from reversal of the RYGB to understand                        her anatomy.                       - The gastric pouch and small bowel lumen appeared wide                        open. The small bowel opening was seen at an angle and                        this may be affecting how the food is reaching the                        small bowel and causing nausea, vomiting with large                        amount of food in the stomach.                       -  Would recommend small frequent meals.                       - Await pathology results.                       - Continue present medications.                       - The findings and recommendations were discussed with                        the patient.                       - The findings and recommendations were discussed with                        the patient's family. Procedure  Code(s):    --- Professional ---                       [phone], Esophagogastroduodenoscopy, flexible, transoral;                        with biopsy, single or multiple Diagnosis Code(s):    --- Professional ---                       R13.10, Dysphagia, unspecified                       R11.2, Nausea with vomiting, unspecified CPT copyright 2018 American Medical Association. All rights reserved. The codes documented in this report are preliminary and upon coder review may  be revised to meet current compliance requirements.  Vonda Antigua, MD Margretta Sidle B. Bonna Gains MD, MD 09/08/2018 9:00:31 AM This report has been signed electronically. Number of Addenda: 0 Note Initiated On: 09/08/2018 8:09 AM Estimated Blood Loss: Estimated blood loss: none.      [facility]

## 2018-09-08 NOTE — Anesthesia Preprocedure Evaluation (Addendum)
Anesthesia Evaluation  Patient identified by MRN, date of birth, ID band Patient awake    Reviewed: Allergy & Precautions, H&P , NPO status , Patient's Chart, lab work & pertinent test results  Airway Mallampati: II       Dental  (+) Teeth Intact   Pulmonary asthma , Current Smoker,           Cardiovascular hypertension,      Neuro/Psych  Headaches, PSYCHIATRIC DISORDERS Anxiety Depression    GI/Hepatic negative GI ROS, Neg liver ROS,   Endo/Other  negative endocrine ROS  Renal/GU negative Renal ROS  negative genitourinary   Musculoskeletal   Abdominal   Peds  Hematology  (+) Blood dyscrasia, anemia , H/o DVT   Anesthesia Other Findings Past Medical History: No date: Anxiety No date: Asthma No date: Chronic headaches No date: Depression No date: Pancreatitis No date: Pancreatitis No date: PTSD (post-traumatic stress disorder)  Past Surgical History: No date: ABDOMINAL SURGERY No date: CHOLECYSTECTOMY No date: DECUBITUS ULCER EXCISION No date: DILATION AND CURETTAGE OF UTERUS No date: GASTRIC BYPASS No date: GASTRIC BYPASS No date: REVISION GASTRIC RESTRICTIVE PROCEDURE FOR MORBID OBESITY     Reproductive/Obstetrics negative OB ROS                            Anesthesia Physical Anesthesia Plan  ASA: II  Anesthesia Plan: General   Post-op Pain Management:    Induction:   PONV Risk Score and Plan: Propofol infusion and TIVA  Airway Management Planned: Natural Airway and Nasal Cannula  Additional Equipment:   Intra-op Plan:   Post-operative Plan:   Informed Consent: I have reviewed the patients History and Physical, chart, labs and discussed the procedure including the risks, benefits and alternatives for the proposed anesthesia with the patient or authorized representative who has indicated his/her understanding and acceptance.     Dental Advisory Given  Plan  Discussed with: Anesthesiologist and CRNA  Anesthesia Plan Comments:         Anesthesia Quick Evaluation

## 2018-09-08 NOTE — H&P (Signed)
Vonda Antigua, MD [address] [place], [place], [place], [address] [address], [place], [place], [place], [address] Phone: [phone]  Fax: [phone]  Primary Care Physician:  Ronnell Freshwater, NP   Pre-Procedure History & Physical: HPI:  Stacy Pineda is a 38 y.o. female is here for a colonoscopy and EGD.   Past Medical History:  Diagnosis Date  . Anxiety   . Asthma   . Chronic headaches   . Depression   . Pancreatitis   . Pancreatitis   . PTSD (post-traumatic stress disorder)     Past Surgical History:  Procedure Laterality Date  . ABDOMINAL SURGERY    . CHOLECYSTECTOMY    . DECUBITUS ULCER EXCISION    . DILATION AND CURETTAGE OF UTERUS    . GASTRIC BYPASS    . GASTRIC BYPASS    . REVISION GASTRIC RESTRICTIVE PROCEDURE FOR MORBID OBESITY      Prior to Admission medications   Medication Sig Start Date End Date Taking? Authorizing Provider  albuterol (VENTOLIN HFA) 108 (90 Base) MCG/ACT inhaler  06/18/15  Yes [provider]  [facility] HFA 100 MCG/ACT AERO  06/19/15  Yes [provider]  clonazePAM (KLONOPIN) 1 MG tablet Take 1 mg by mouth 2 (two) times daily.   Yes [provider]  mirtazapine (REMERON) 45 MG tablet Take 1 tablet (45 mg total) by mouth at bedtime. 04/08/18 04/08/19 Yes Eksir, Richard Miu, MD  prazosin (MINIPRESS) 2 MG capsule Take 2 mg by mouth at bedtime.   Yes [provider]  pregabalin (LYRICA) 50 MG capsule Take 1 capsule (50 mg total) by mouth 3 (three) times daily. 05/18/18  Yes Lavera Guise, MD  venlafaxine (EFFEXOR) 75 MG tablet Take 75 mg by mouth 2 (two) times daily.   Yes [provider]  Butalbital-APAP-Caff-Cod 50-300-40-30 MG CAPS TAKE ONE TAB PO QD PRN FOR PAIN 01/20/18   Ronnell Freshwater, NP  CREON 36000 UNITS CPEP capsule  06/19/15   [provider]  fentaNYL (DURAGESIC) 12 MCG/HR Place 1 patch (12.5 mcg total) onto the skin every 3 (three) days. 07/21/18   Kendell Bane, NP  medroxyPROGESTERone (DEPO-PROVERA) 150 MG/ML injection Inject 1 mL (150 mg total) into the muscle every 3 (three) months. 12/25/17   Diona Fanti, CNM    Allergies as of 08/17/2018 - Review Complete 08/17/2018  Allergen Reaction Noted  . Fentanyl Itching 03/27/2015  . Morphine and related Itching 03/27/2015  . Vicodin [hydrocodone-acetaminophen] Itching 03/27/2015    Family History  Problem Relation Age of Onset  . Graves' disease Mother   . Hypertension Mother   . Hyperlipidemia Mother   . Anxiety disorder Father   . Prostate cancer Father   . Depression Brother   . Anxiety disorder Brother   . Anxiety disorder Maternal Aunt   . Depression Maternal Aunt   . Anxiety disorder Paternal Aunt   . Depression Paternal Aunt   . Anxiety disorder Maternal Uncle   . Depression Maternal Uncle   . Anxiety disorder Paternal Uncle   . Depression Paternal Uncle   . Anxiety disorder Maternal Grandfather   . Depression Maternal Grandfather   . Diabetes Maternal Grandfather   . Anxiety disorder Maternal Grandmother   . Depression Maternal Grandmother   . Breast cancer Maternal Grandmother   . Ovarian cancer Maternal Grandmother   . Diabetes Maternal Grandmother   . Colon cancer Neg Hx     Social History   Socioeconomic History  .  Marital status: Single    Spouse name: Not on file  . Number of children: Not on file  . Years of education: Not on file  . Highest education level: Not on file  Occupational History  . Not on file  Social Needs  . Financial resource strain: Not on file  . Food insecurity:    Worry: Not on file    Inability: Not on file  . Transportation needs:    Medical: Not on file    Non-medical: Not on file  Tobacco Use  . Smoking status: Current Some Day Smoker    Packs/day: 0.25    Types: Cigars, E-cigarettes, Cigarettes    Start date: 03/26/2005  . Smokeless tobacco: Never Used  . Tobacco comment: pt smoke weekly  Substance and  Sexual Activity  . Alcohol use: No    Alcohol/week: 0.0 standard drinks  . Drug use: Yes    Frequency: 3.0 times per week    Types: Marijuana    Comment: MJ use weekly  . Sexual activity: Not Currently    Birth control/protection: Injection  Lifestyle  . Physical activity:    Days per week: Not on file    Minutes per session: Not on file  . Stress: Not on file  Relationships  . Social connections:    Talks on phone: Not on file    Gets together: Not on file    Attends religious service: Not on file    Active member of club or organization: Not on file    Attends meetings of clubs or organizations: Not on file    Relationship status: Not on file  . Intimate partner violence:    Fear of current or ex partner: Not on file    Emotionally abused: Not on file    Physically abused: Not on file    Forced sexual activity: Not on file  Other Topics Concern  . Not on file  Social History Narrative  . Not on file    Review of Systems: See HPI, otherwise negative ROS  Physical Exam: BP 116/83   Pulse 89   Temp (!) 97.5 F (36.4 C) (Tympanic)   Resp 18   Ht 6\' 1"  (1.854 m)   Wt 77.1 kg   SpO2 100%   BMI 22.43 kg/m  General:   Alert,  pleasant and cooperative in NAD Head:  Normocephalic and atraumatic. Neck:  Supple; no masses or thyromegaly. Lungs:  Clear throughout to auscultation, normal respiratory effort.    Heart:  +S1, +S2, Regular rate and rhythm, No edema. Abdomen:  Soft, nontender and nondistended. Normal bowel sounds, without guarding, and without rebound.   Neurologic:  Alert and  oriented x4;  grossly normal neurologically.  Impression/Plan: Stacy Pineda is here for a colonoscopy to be performed for diarrhea, blood per rectum and EGD for Nausea, vomiting, diarrhea.  Risks, benefits, limitations, and alternatives regarding the procedures have been reviewed with the patient.  Questions have been answered.  All parties agreeable.   Virgel Manifold, MD   09/08/2018, 8:07 AM

## 2018-09-08 NOTE — Transfer of Care (Signed)
Immediate Anesthesia Transfer of Care Note  Patient: Johnnetta Martinique  Procedure(s) Performed: COLONOSCOPY WITH PROPOFOL (N/A ) ESOPHAGOGASTRODUODENOSCOPY (EGD) WITH PROPOFOL (N/A )  Patient Location: PACU  Anesthesia Type:General  Level of Consciousness: awake and alert   Airway & Oxygen Therapy: Patient Spontanous Breathing and Patient connected to nasal cannula oxygen  Post-op Assessment: Report given to RN and Post -op Vital signs reviewed and stable  Post vital signs: Reviewed and stable  Last Vitals:  Vitals Value Taken Time  BP 111/79 09/08/2018  8:53 AM  Temp    Pulse 67 09/08/2018  8:55 AM  Resp 23 09/08/2018  8:55 AM  SpO2 100 % 09/08/2018  8:55 AM  Vitals shown include unvalidated device data.  Last Pain:  Vitals:   09/08/18 0724  TempSrc: Tympanic  PainSc: 6          Complications: No apparent anesthesia complications

## 2018-09-08 NOTE — Op Note (Signed)
[facility] Patient Name: Stacy Pineda Procedure Date: 09/08/2018 8:09 AM MRN: [id number] Account #: [id number] Date of Birth: 25-Oct-1980 Admit Type: Outpatient Age: 38 Room: [facility] ENDO ROOM 1 Gender: Female Note Status: Finalized Procedure:            Colonoscopy Indications:          Clinically significant diarrhea of unexplained origin,                        Rectal bleeding Providers:            Briseyda Fehr B. Bonna Gains MD, MD Medicines:            Monitored Anesthesia Care Complications:        No immediate complications. Procedure:            Pre-Anesthesia Assessment:                       - Prior to the procedure, a History and Physical was                        performed, and patient medications, allergies and                        sensitivities were reviewed. The patient's tolerance of                        previous anesthesia was reviewed.                       - The risks and benefits of the procedure and the                        sedation options and risks were discussed with the                        patient. All questions were answered and informed                        consent was obtained.                       - Patient identification and proposed procedure were                        verified prior to the procedure by the physician, the                        nurse, the anesthetist and the technician. The                        procedure was verified in the pre-procedure area in the                        procedure room in the endoscopy suite.                       - ASA Grade Assessment: II - A patient with mild                        systemic disease.                       -  After reviewing the risks and benefits, the patient                        was deemed in satisfactory condition to undergo the                        procedure.                       After obtaining informed consent, the colonoscope was        passed under direct vision. Throughout the procedure,                        the patient's blood pressure, pulse, and oxygen                        saturations were monitored continuously. The                        Colonoscope was introduced through the anus and                        advanced to the the cecum, identified by appendiceal                        orifice and ileocecal valve. The colonoscopy was                        performed with ease. The patient tolerated the                        procedure well. The quality of the bowel preparation                        was fair. Findings:      Hemorrhoids were found on perianal exam.      The rectum, sigmoid colon, descending colon, transverse colon, ascending       colon and cecum appeared normal. Biopsies for histology were taken with       a cold forceps from the cecum, ascending colon, transverse colon,       descending colon, sigmoid colon and rectum for evaluation of microscopic       colitis.      Non-bleeding internal hemorrhoids were found during retroflexion. Impression:           - Preparation of the colon was fair.                       - Hemorrhoids found on perianal exam.                       - The rectum, sigmoid colon, descending colon,                        transverse colon, ascending colon and cecum are normal.                        Biopsied.                       - Non-bleeding internal hemorrhoids.                       -  Due to the prep, this was not a satisfactory exam for                        colorectal cancer screening, that is evaluation for                        small or flat lesions or polyps. Patient should follow                        up in clinic after discharge to discuss need for future                        colonoscopy and colorectal cancer screening. Recommendation:       - Discharge patient to home.                       - Resume previous diet.                       - Continue present  medications.                       - Repeat colonoscopy in 5 years for screening purposes.                       - Return to primary care physician as previously                        scheduled.                       - The findings and recommendations were discussed with                        the patient.                       - The findings and recommendations were discussed with                        the patient's family.                       - Await pathology results.                       - High fiber diet.                       - Source of intermittent rectal bleeding is the                        hemorrhoids seen on exam. Can consider Hydrocortisone                        suppositories if has recurrent symptoms after high                        fiber diet. Procedure Code(s):    --- Professional ---                       [phone], Colonoscopy, flexible; with biopsy, single or  multiple Diagnosis Code(s):    --- Professional ---                       K64.8, Other hemorrhoids                       R19.7, Diarrhea, unspecified                       K62.5, Hemorrhage of anus and rectum CPT copyright 2018 American Medical Association. All rights reserved. The codes documented in this report are preliminary and upon coder review may  be revised to meet current compliance requirements.  Vonda Antigua, MD Margretta Sidle B. Bonna Gains MD, MD 09/08/2018 8:55:35 AM This report has been signed electronically. Number of Addenda: 0 Note Initiated On: 09/08/2018 8:09 AM Scope Withdrawal Time: 0 hours 10 minutes 24 seconds  Total Procedure Duration: 0 hours 13 minutes 58 seconds  Estimated Blood Loss: Estimated blood loss: none.      [facility]

## 2018-09-09 ENCOUNTER — Ambulatory Visit: Payer: Self-pay | Admitting: Gastroenterology

## 2018-09-09 ENCOUNTER — Encounter: Payer: Self-pay | Admitting: Gastroenterology

## 2018-09-09 LAB — SURGICAL PATHOLOGY

## 2018-09-14 ENCOUNTER — Ambulatory Visit: Payer: Medicare Other | Admitting: Adult Health

## 2018-09-14 ENCOUNTER — Encounter: Payer: Self-pay | Admitting: Adult Health

## 2018-09-14 VITALS — BP 120/80 | HR 80 | Resp 16 | Ht 73.0 in | Wt 177.0 lb

## 2018-09-14 DIAGNOSIS — F4310 Post-traumatic stress disorder, unspecified: Secondary | ICD-10-CM

## 2018-09-14 DIAGNOSIS — E538 Deficiency of other specified B group vitamins: Secondary | ICD-10-CM | POA: Diagnosis not present

## 2018-09-14 DIAGNOSIS — G8929 Other chronic pain: Secondary | ICD-10-CM | POA: Diagnosis not present

## 2018-09-14 DIAGNOSIS — R109 Unspecified abdominal pain: Secondary | ICD-10-CM

## 2018-09-14 DIAGNOSIS — I10 Essential (primary) hypertension: Secondary | ICD-10-CM

## 2018-09-14 MED ORDER — CYANOCOBALAMIN 1000 MCG/ML IJ SOLN
1000.0000 ug | Freq: Once | INTRAMUSCULAR | Status: AC
  Administered 2018-09-14: 1000 ug via INTRAMUSCULAR

## 2018-09-14 MED ORDER — FENTANYL 12 MCG/HR TD PT72: 1.0000 | MEDICATED_PATCH | TRANSDERMAL | 0 refills | Status: DC

## 2018-09-14 NOTE — Patient Instructions (Signed)
Cyanocobalamin, Vitamin B12 injection What is this medicine? CYANOCOBALAMIN (sye an oh koe BAL a min) is a man made form of vitamin B12. Vitamin B12 is used in the growth of healthy blood cells, nerve cells, and proteins in the body. It also helps with the metabolism of fats and carbohydrates. This medicine is used to treat people who can not absorb vitamin B12. This medicine may be used for other purposes; ask your health care provider or pharmacist if you have questions. COMMON BRAND NAME(S): B-12 Compliance Kit, B-12 Injection Kit, Cyomin, LA-12, Nutri-Twelve, Physicians EZ Use B-12, Primabalt What should I tell my health care provider before I take this medicine? They need to know if you have any of these conditions: -kidney disease -Leber's disease -megaloblastic anemia -an unusual or allergic reaction to cyanocobalamin, cobalt, other medicines, foods, dyes, or preservatives -pregnant or trying to get pregnant -breast-feeding How should I use this medicine? This medicine is injected into a muscle or deeply under the skin. It is usually given by a health care professional in a clinic or doctor's office. However, your doctor may teach you how to inject yourself. Follow all instructions. Talk to your pediatrician regarding the use of this medicine in children. Special care may be needed. Overdosage: If you think you have taken too much of this medicine contact a poison control center or emergency room at once. NOTE: This medicine is only for you. Do not share this medicine with others. What if I miss a dose? If you are given your dose at a clinic or doctor's office, call to reschedule your appointment. If you give your own injections and you miss a dose, take it as soon as you can. If it is almost time for your next dose, take only that dose. Do not take double or extra doses. What may interact with this medicine? -colchicine -heavy alcohol intake This list may not describe all possible  interactions. Give your health care provider a list of all the medicines, herbs, non-prescription drugs, or dietary supplements you use. Also tell them if you smoke, drink alcohol, or use illegal drugs. Some items may interact with your medicine. What should I watch for while using this medicine? Visit your doctor or health care professional regularly. You may need blood work done while you are taking this medicine. You may need to follow a special diet. Talk to your doctor. Limit your alcohol intake and avoid smoking to get the best benefit. What side effects may I notice from receiving this medicine? Side effects that you should report to your doctor or health care professional as soon as possible: -allergic reactions like skin rash, itching or hives, swelling of the face, lips, or tongue -blue tint to skin -chest tightness, pain -difficulty breathing, wheezing -dizziness -red, swollen painful area on the leg Side effects that usually do not require medical attention (report to your doctor or health care professional if they continue or are bothersome): -diarrhea -headache This list may not describe all possible side effects. Call your doctor for medical advice about side effects. You may report side effects to FDA at 1-800-FDA-1088. Where should I keep my medicine? Keep out of the reach of children. Store at room temperature between 15 and 30 degrees C (59 and 85 degrees F). Protect from light. Throw away any unused medicine after the expiration date. NOTE: This sheet is a summary. It may not cover all possible information. If you have questions about this medicine, talk to your doctor, pharmacist, or   health care provider.  2019 Elsevier/Gold Standard (2007-11-08 22:10:20)  

## 2018-09-14 NOTE — Progress Notes (Signed)
[facility] [place], [place] [address]  Internal MEDICINE  Office Visit Note  Patient Name: Stacy Pineda  [id number]  [id number]  Date of Service: 09/14/2018  Chief Complaint  Patient presents with  . Anxiety  . Depression    HPI Patient is here for follow-up of anxiety, depression, and ongoing abdominal pain and nausea.  Patient was seen by gastroenterology and a endoscopy and colonoscopy were performed.  Patient reports that the GI doctor told her she had internal as well as external hemorrhoids.  She also reports that anatomically the patient's stomach is an angle which could be the main contributor to her nausea as well as her pain.  She has a follow-up appointment with GI on 25th.  She is in need of refill on her fentanyl Duragesic patches.    Current Medication: Outpatient Encounter Medications as of 09/14/2018  Medication Sig Note  . albuterol (VENTOLIN HFA) 108 (90 Base) MCG/ACT inhaler  06/28/2015: Received from: [facility]  . ASMANEX HFA 100 MCG/ACT AERO  06/28/2015: Received from: External Pharmacy  . Butalbital-APAP-Caff-Cod 50-300-40-30 MG CAPS TAKE ONE TAB PO QD PRN FOR PAIN   . clonazePAM (KLONOPIN) 1 MG tablet Take 1 mg by mouth 2 (two) times daily.   Marland Kitchen CREON 36000 UNITS CPEP capsule  08/17/2018: Out. Pt states she did not notice a difference when she took it.  . fentaNYL (DURAGESIC) 12 MCG/HR Place 1 patch (12.5 mcg total) onto the skin every 3 (three) days.   . medroxyPROGESTERone (DEPO-PROVERA) 150 MG/ML injection Inject 1 mL (150 mg total) into the muscle every 3 (three) months.   . mirtazapine (REMERON) 45 MG tablet Take 1 tablet (45 mg total) by mouth at bedtime.   . prazosin (MINIPRESS) 2 MG capsule Take 2 mg by mouth at bedtime.   . pregabalin (LYRICA) 50 MG capsule Take 1 capsule (50 mg total) by mouth 3 (three) times daily.   Marland Kitchen venlafaxine XR (EFFEXOR-XR) 150 MG 24 hr capsule TAKE 2 CAPSULES BY MOUTH IN THE MORNING   .  [DISCONTINUED] venlafaxine (EFFEXOR) 75 MG tablet Take 75 mg by mouth 2 (two) times daily.    No facility-administered encounter medications on file as of 09/14/2018.     Surgical History: Past Surgical History:  Procedure Laterality Date  . ABDOMINAL SURGERY    . CHOLECYSTECTOMY    . COLONOSCOPY WITH PROPOFOL N/A 09/08/2018   Procedure: COLONOSCOPY WITH PROPOFOL;  Surgeon: Virgel Manifold, MD;  Location: [facility] ENDOSCOPY;  Service: Endoscopy;  Laterality: N/A;  . DECUBITUS ULCER EXCISION    . DILATION AND CURETTAGE OF UTERUS    . ESOPHAGOGASTRODUODENOSCOPY (EGD) WITH PROPOFOL N/A 09/08/2018   Procedure: ESOPHAGOGASTRODUODENOSCOPY (EGD) WITH PROPOFOL;  Surgeon: Virgel Manifold, MD;  Location: [facility] ENDOSCOPY;  Service: Endoscopy;  Laterality: N/A;  . GASTRIC BYPASS    . GASTRIC BYPASS    . REVISION GASTRIC RESTRICTIVE PROCEDURE FOR MORBID OBESITY      Medical History: Past Medical History:  Diagnosis Date  . Anxiety   . Asthma   . Chronic headaches   . Depression   . Pancreatitis   . Pancreatitis   . PTSD (post-traumatic stress disorder)     Family History: Family History  Problem Relation Age of Onset  . Graves' disease Mother   . Hypertension Mother   . Hyperlipidemia Mother   . Anxiety disorder Father   . Prostate cancer Father   . Depression Brother   . Anxiety disorder Brother   .  Anxiety disorder Maternal Aunt   . Depression Maternal Aunt   . Anxiety disorder Paternal Aunt   . Depression Paternal Aunt   . Anxiety disorder Maternal Uncle   . Depression Maternal Uncle   . Anxiety disorder Paternal Uncle   . Depression Paternal Uncle   . Anxiety disorder Maternal Grandfather   . Depression Maternal Grandfather   . Diabetes Maternal Grandfather   . Anxiety disorder Maternal Grandmother   . Depression Maternal Grandmother   . Breast cancer Maternal Grandmother   . Ovarian cancer Maternal Grandmother   . Diabetes Maternal Grandmother   . Colon cancer Neg  Hx     Social History   Socioeconomic History  . Marital status: Single    Spouse name: Not on file  . Number of children: Not on file  . Years of education: Not on file  . Highest education level: Not on file  Occupational History  . Not on file  Social Needs  . Financial resource strain: Not on file  . Food insecurity:    Worry: Not on file    Inability: Not on file  . Transportation needs:    Medical: Not on file    Non-medical: Not on file  Tobacco Use  . Smoking status: Current Some Day Smoker    Packs/day: 0.25    Types: Cigars, E-cigarettes, Cigarettes    Start date: 03/26/2005  . Smokeless tobacco: Never Used  . Tobacco comment: pt smoke weekly  Substance and Sexual Activity  . Alcohol use: No    Alcohol/week: 0.0 standard drinks  . Drug use: Yes    Frequency: 3.0 times per week    Types: Marijuana    Comment: MJ use weekly  . Sexual activity: Not Currently    Birth control/protection: Injection  Lifestyle  . Physical activity:    Days per week: Not on file    Minutes per session: Not on file  . Stress: Not on file  Relationships  . Social connections:    Talks on phone: Not on file    Gets together: Not on file    Attends religious service: Not on file    Active member of club or organization: Not on file    Attends meetings of clubs or organizations: Not on file    Relationship status: Not on file  . Intimate partner violence:    Fear of current or ex partner: Not on file    Emotionally abused: Not on file    Physically abused: Not on file    Forced sexual activity: Not on file  Other Topics Concern  . Not on file  Social History Narrative  . Not on file      Review of Systems  Constitutional: Negative for chills, fatigue and unexpected weight change.  HENT: Negative for congestion, rhinorrhea, sneezing and sore throat.   Eyes: Negative for photophobia, pain and redness.  Respiratory: Negative for cough, chest tightness and shortness of  breath.   Cardiovascular: Negative for chest pain and palpitations.  Gastrointestinal: Negative for abdominal pain, constipation, diarrhea, nausea and vomiting.  Endocrine: Negative.   Genitourinary: Negative for dysuria and frequency.  Musculoskeletal: Negative for arthralgias, back pain, joint swelling and neck pain.  Skin: Negative for rash.  Allergic/Immunologic: Negative.   Neurological: Negative for tremors and numbness.  Hematological: Negative for adenopathy. Does not bruise/bleed easily.  Psychiatric/Behavioral: Negative for behavioral problems and sleep disturbance. The patient is not nervous/anxious.     Vital Signs: BP 120/80  Pulse 80   Resp 16   Ht 6\' 1"  (1.854 m)   Wt 177 lb (80.3 kg)   SpO2 98%   BMI 23.35 kg/m    Physical Exam Vitals signs and nursing note reviewed.  Constitutional:      General: She is not in acute distress.    Appearance: She is well-developed. She is not diaphoretic.  HENT:     Head: Normocephalic and atraumatic.     Mouth/Throat:     Pharynx: No oropharyngeal exudate.  Eyes:     Pupils: Pupils are equal, round, and reactive to light.  Neck:     Musculoskeletal: Normal range of motion and neck supple.     Thyroid: No thyromegaly.     Vascular: No JVD.     Trachea: No tracheal deviation.  Cardiovascular:     Rate and Rhythm: Normal rate and regular rhythm.     Heart sounds: Normal heart sounds. No murmur. No friction rub. No gallop.   Pulmonary:     Effort: Pulmonary effort is normal. No respiratory distress.     Breath sounds: Normal breath sounds. No wheezing or rales.  Chest:     Chest wall: No tenderness.  Abdominal:     Palpations: Abdomen is soft.     Tenderness: There is no abdominal tenderness. There is no guarding.  Musculoskeletal: Normal range of motion.  Lymphadenopathy:     Cervical: No cervical adenopathy.  Skin:    General: Skin is warm and dry.  Neurological:     Mental Status: She is alert and oriented to  person, place, and time.     Cranial Nerves: No cranial nerve deficit.  Psychiatric:        Behavior: Behavior normal.        Thought Content: Thought content normal.        Judgment: Judgment normal.    Assessment/Plan: 1. Chronic abdominal pain Fentanyl Duragesic patch is renewed at this time.  Encourage patient to keep follow-up appointment with GI on the 25th. - fentaNYL (DURAGESIC) 12 MCG/HR; Place 1 patch onto the skin every 3 (three) days.  Dispense: 10 patch; Refill: 0 Reviewed risks and possible side effects associated with taking opiates, benzodiazepines and other CNS depressants. Combination of these could cause dizziness and drowsiness. Advised patient not to drive or operate machinery when taking these medications, as patient's and other's life can be at risk and will have consequences. Patient verbalized understanding in this matter. Dependence and abuse for these drugs will be monitored closely. A Controlled substance policy and procedure is on file which allows [facility] to order a urine drug screen test at any visit. Patient understands and agrees with the plan  2. B12 deficiency Patient's vitamin B12 level is low.  Injection given at this visit. - cyanocobalamin ((VITAMIN B-12)) injection 1,000 mcg  3. Essential (primary) hypertension Stable, continue current medication regimen as prescribed.  4. PTSD (post-traumatic stress disorder) Stable, continue current medications.  General Counseling: vineta carone understanding of the findings of todays visit and agrees with plan of treatment. I have discussed any further diagnostic evaluation that may be needed or ordered today. We also reviewed her medications today. she has been encouraged to call the office with any questions or concerns that should arise related to todays visit.    No orders of the defined types were placed in this encounter.   No orders of the defined types were placed in this  encounter.   Time spent:  25 Minutes   This patient was seen by Orson Gear AGNP-C in Collaboration with Dr Lavera Guise as a part of collaborative care agreement     Kendell Bane AGNP-C Internal medicine

## 2018-09-17 NOTE — Progress Notes (Deleted)
[place]  Telephone:([phone] Fax:([phone]) [phone]  ID: Stacy Pineda OB: 1981-07-04  MR#: [id number]  NMM#:[id number]  Patient Care Team: Ronnell Freshwater, NP as PCP - General (Family Medicine)  CHIEF COMPLAINT:  Iron deficiency anemia, beta thalassemia minor.  INTERVAL HISTORY: Patient returns to clinic today for repeat laboratory work, further evaluation, consideration of additional IV Feraheme.  She currently feels well and is asymptomatic.  She does not complain of weakness or fatigue today. She has no neurologic complaints. She has a good appetite and denies weight loss.  She denies any recent fevers or illnesses.  She denies any chest pain or shortness of breath. She denies any nausea, vomiting, constipation, or diarrhea.  She has no melena or hematochezia.  She has no urinary complaints.  Patient feels at her baseline offers no specific complaints today.   REVIEW OF SYSTEMS:   Review of Systems  Constitutional: Negative.  Negative for fever, malaise/fatigue and weight loss.  Respiratory: Negative.  Negative for cough and shortness of breath.   Cardiovascular: Negative.  Negative for chest pain and leg swelling.  Gastrointestinal: Negative.  Negative for abdominal pain, blood in stool and melena.  Genitourinary: Negative.  Negative for hematuria.  Musculoskeletal: Negative.  Negative for back pain.  Skin: Negative.  Negative for rash.  Neurological: Negative.  Negative for focal weakness, weakness and headaches.  Psychiatric/Behavioral: Negative.  The patient is not nervous/anxious.     As per HPI. Otherwise, a complete review of systems is negative.  PAST MEDICAL HISTORY: Past Medical History:  Diagnosis Date  . Anxiety   . Asthma   . Chronic headaches   . Depression   . Pancreatitis   . Pancreatitis   . PTSD (post-traumatic stress disorder)     PAST SURGICAL HISTORY: Past Surgical History:  Procedure Laterality Date  . ABDOMINAL SURGERY      . CHOLECYSTECTOMY    . COLONOSCOPY WITH PROPOFOL N/A 09/08/2018   Procedure: COLONOSCOPY WITH PROPOFOL;  Surgeon: Virgel Manifold, MD;  Location: [facility] ENDOSCOPY;  Service: Endoscopy;  Laterality: N/A;  . DECUBITUS ULCER EXCISION    . DILATION AND CURETTAGE OF UTERUS    . ESOPHAGOGASTRODUODENOSCOPY (EGD) WITH PROPOFOL N/A 09/08/2018   Procedure: ESOPHAGOGASTRODUODENOSCOPY (EGD) WITH PROPOFOL;  Surgeon: Virgel Manifold, MD;  Location: [facility] ENDOSCOPY;  Service: Endoscopy;  Laterality: N/A;  . GASTRIC BYPASS    . GASTRIC BYPASS    . REVISION GASTRIC RESTRICTIVE PROCEDURE FOR MORBID OBESITY      FAMILY HISTORY Family History  Problem Relation Age of Onset  . Graves' disease Mother   . Hypertension Mother   . Hyperlipidemia Mother   . Anxiety disorder Father   . Prostate cancer Father   . Depression Brother   . Anxiety disorder Brother   . Anxiety disorder Maternal Aunt   . Depression Maternal Aunt   . Anxiety disorder Paternal Aunt   . Depression Paternal Aunt   . Anxiety disorder Maternal Uncle   . Depression Maternal Uncle   . Anxiety disorder Paternal Uncle   . Depression Paternal Uncle   . Anxiety disorder Maternal Grandfather   . Depression Maternal Grandfather   . Diabetes Maternal Grandfather   . Anxiety disorder Maternal Grandmother   . Depression Maternal Grandmother   . Breast cancer Maternal Grandmother   . Ovarian cancer Maternal Grandmother   . Diabetes Maternal Grandmother   . Colon cancer Neg Hx        ADVANCED DIRECTIVES:  HEALTH MAINTENANCE: Social History   Tobacco Use  . Smoking status: Current Some Day Smoker    Packs/day: 0.25    Types: Cigars, E-cigarettes, Cigarettes    Start date: 03/26/2005  . Smokeless tobacco: Never Used  . Tobacco comment: pt smoke weekly  Substance Use Topics  . Alcohol use: No    Alcohol/week: 0.0 standard drinks  . Drug use: Yes    Frequency: 3.0 times per week    Types: Marijuana    Comment: MJ use  weekly     Allergies  Allergen Reactions  . Fentanyl Itching  . Morphine And Related Itching    Itchy.   . Vicodin [Hydrocodone-Acetaminophen] Itching    Current Outpatient Medications  Medication Sig Dispense Refill  . albuterol (VENTOLIN HFA) 108 (90 Base) MCG/ACT inhaler     . ASMANEX HFA 100 MCG/ACT AERO     . Butalbital-APAP-Caff-Cod 50-300-40-30 MG CAPS TAKE ONE TAB PO QD PRN FOR PAIN 20 capsule 1  . clonazePAM (KLONOPIN) 1 MG tablet Take 1 mg by mouth 2 (two) times daily.    Marland Kitchen CREON 36000 UNITS CPEP capsule     . fentaNYL (DURAGESIC) 12 MCG/HR Place 1 patch onto the skin every 3 (three) days. 10 patch 0  . medroxyPROGESTERone (DEPO-PROVERA) 150 MG/ML injection Inject 1 mL (150 mg total) into the muscle every 3 (three) months. 1 mL 0  . mirtazapine (REMERON) 45 MG tablet Take 1 tablet (45 mg total) by mouth at bedtime. 90 tablet 0  . prazosin (MINIPRESS) 2 MG capsule Take 2 mg by mouth at bedtime.    . pregabalin (LYRICA) 50 MG capsule Take 1 capsule (50 mg total) by mouth 3 (three) times daily. 90 capsule 3  . venlafaxine XR (EFFEXOR-XR) 150 MG 24 hr capsule TAKE 2 CAPSULES BY MOUTH IN THE MORNING     No current facility-administered medications for this visit.     OBJECTIVE: There were no vitals filed for this visit.   There is no height or weight on file to calculate BMI.    ECOG FS:0 - Asymptomatic  General: Well-developed, well-nourished, no acute distress. Eyes: Pink conjunctiva, anicteric sclera. HEENT: Normocephalic, moist mucous membranes. Lungs: Clear to auscultation bilaterally. Heart: Regular rate and rhythm. No rubs, murmurs, or gallops. Abdomen: Soft, nontender, nondistended. No organomegaly noted, normoactive bowel sounds. Musculoskeletal: No edema, cyanosis, or clubbing. Neuro: Alert, answering all questions appropriately. Cranial nerves grossly intact. Skin: No rashes or petechiae noted. Psych: Normal affect.  LAB RESULTS:  Lab Results  Component  Value Date   NA 142 08/19/2018   K 4.2 08/19/2018   CL 108 (H) 08/19/2018   CO2 19 (L) 08/19/2018   GLUCOSE 84 08/19/2018   BUN 9 08/19/2018   CREATININE 0.59 08/19/2018   CALCIUM 8.8 08/19/2018   PROT 6.7 08/19/2018   ALBUMIN 4.5 08/19/2018   AST 19 08/19/2018   ALT 24 08/19/2018   ALKPHOS 145 (H) 08/19/2018   BILITOT 0.4 08/19/2018   GFRNONAA 116 08/19/2018   GFRAA 134 08/19/2018    Lab Results  Component Value Date   WBC 7.5 08/19/2018   NEUTROABS 4.1 08/19/2018   HGB 12.3 08/19/2018   HCT 40.5 08/19/2018   MCV 81 08/19/2018   PLT 267 08/19/2018   Lab Results  Component Value Date   IRON 124 05/24/2018   TIBC 335 05/24/2018   IRONPCTSAT 37 (H) 05/24/2018    Lab Results  Component Value Date   FERRITIN 35 05/24/2018  STUDIES: No results found.  ASSESSMENT: Iron deficiency anemia, beta thalassemia minor.  PLAN:    1. Iron deficiency anemia: Patient's most recent hemoglobin is 10.7 which is likely her baseline given her underlying beta thalassemia.  Her iron stores are within normal limits. Previously, the remainder of her laboratory work was either negative or within normal limits.  She does not require additional Feraheme today.  Patient last received treatment on March 05, 2018.  Return to clinic in 4 months with repeat laboratory work and further evaluation.     2. Heavy menses: Resolved. Continue Depo Provera per gynecology.  3. Beta thalassemia minor: Confirmed by hemoglobin electrophoresis.  Patient will likely have a baseline microcytic anemia because of this. 4. Smudge cells: Previously noted on peripheral blood smear.  Peripheral blood flow cytometry was negative.  Patient expressed understanding and was in agreement with this plan. She also understands that She can call clinic at any time with any questions, concerns, or complaints.    Lloyd Huger, MD 09/17/18 12:52 PM

## 2018-09-21 ENCOUNTER — Inpatient Hospital Stay: Payer: Medicare Other

## 2018-09-21 ENCOUNTER — Inpatient Hospital Stay: Payer: Medicare Other | Admitting: Oncology

## 2018-10-05 ENCOUNTER — Encounter: Payer: Self-pay | Admitting: Gastroenterology

## 2018-10-05 ENCOUNTER — Ambulatory Visit (INDEPENDENT_AMBULATORY_CARE_PROVIDER_SITE_OTHER): Payer: Medicare Other | Admitting: Gastroenterology

## 2018-10-05 VITALS — BP 145/99 | HR 101 | Ht 73.0 in | Wt 174.4 lb

## 2018-10-05 DIAGNOSIS — G8929 Other chronic pain: Secondary | ICD-10-CM | POA: Diagnosis not present

## 2018-10-05 DIAGNOSIS — R112 Nausea with vomiting, unspecified: Secondary | ICD-10-CM

## 2018-10-05 DIAGNOSIS — R1013 Epigastric pain: Secondary | ICD-10-CM

## 2018-10-05 DIAGNOSIS — R197 Diarrhea, unspecified: Secondary | ICD-10-CM

## 2018-10-05 NOTE — Patient Instructions (Signed)
Gastric Emptying Study October 19, 2018 at [facility] (arrive 8:45 am to check in at the medical mall registration desk) Nothing to eat or drink after midnight This is a 4 hr test.

## 2018-10-05 NOTE — Progress Notes (Signed)
Stacy Antigua, MD [address]  [place]  [place], [place] [address]  Main: [phone]  Fax: [phone]   Primary Care Physician: Ronnell Freshwater, NP   Chief Complaint  Patient presents with  . Follow-up    vomiting and abdominal pain    HPI: Stacy Pineda is a 38 y.o. female with complicated surgical history here for follow-up.  Patient still reports nausea and vomiting.  States symptoms occur daily.  However it is interesting that throughout our visits patient does not have any nausea or vomiting, and is very comfortable, and looks healthy.  She has not had any documented weight loss despite her complaints of chronic nausea and vomiting.  She also reports after 15 to 20 minutes of eating she will have midepigastric to left upper quadrant abdominal pain (cramping, 5 or 10, nonradiating) and after an hour of eating she will have diarrhea.  EGD January 2020 showed evidence of previous surgery with sutures found in the stomach.  No ulcer seen.  Healthy appearing anastomosis seen.  No ulcers or strictures seen in the entire exam.  Examined duodenum was normal.  Opening to the small bowel was just past the area of the suture seen in the stomach and was wide open.  A small opening was seen at an angle and this may be affecting how food is reaching the small bowel and leading to nausea vomiting.  Colonoscopy showed a fair prep.  Hemorrhoids found on the exam.  Exam was otherwise normal.  Nonbleeding internal hemorrhoids were thought to be the cause of her intermittent bright red blood per rectum which patient states occurs 2-3 times a week.  States she has tried creams and suppositories in the past and continues to have intermittent bright red blood per rectum.  DIAGNOSIS:  A. STOMACH, BODY; COLD BIOPSY:  - GASTRIC ANTRAL AND OXYNTIC MUCOSA WITH MILD CHRONIC INACTIVE GASTRITIS  AND FOVEOLAR HYPERPLASIA.  - NEGATIVE FOR H. PYLORI, DYSPLASIA, AND MALIGNANCY.   B. ESOPHAGUS;  COLD BIOPSY:  - BENIGN SQUAMOUS MUCOSA WITH NO SIGNIFICANT HISTOPATHOLOGIC CHANGE.  - NO INCREASE IN INTRAEPITHELIAL EOSINOPHILS (LESS THAN 2 PER HPF).  - NEGATIVE FOR DYSPLASIA AND MALIGNANCY.   C. COLON, RANDOM; COLD BIOPSY:  - BENIGN COLONIC MUCOSA WITH NO SIGNIFICANT HISTOPATHOLOGIC CHANGE.  - NEGATIVE FOR FEATURES OF MICROSCOPIC COLITIS, DYSPLASIA, AND  MALIGNANCY.    Previous history: - S/p laparoscopic reversal of gastric bypass/partial gastrectomy 2015, secondary to chronic recurrent marginal ulcer  - S/p Roux-en-Y 2012, gastrojejunal anastomotic revision with vagotomy in 2014 secondary to chronic nonhealing ulcer - S/p cholecystectomy, and transgastric ERCP for CBD stone in the setting of gallstone pancreatitis  2017 EGD by Dr. Ephriam Knuckles Findings:    The examined esophagus was normal.    Evidence of a gastroplasty were found in the gastric body. This was     characterized by healthy appearing mucosa.    A benign-appearing, intrinsic stenosis was found at the pylorus. This     was traversed. Area was successfully injected with 100 units botulinum     toxin.    The examined duodenum was normal.  As per GI clinic note from [facility] from April 2019  "She had a RYGB by Dr. Cletus Gash in 05/2011.She then developed gallstone pancreatitis (presumably) in 11/2011. She had a cholecystectomy and later had a transgastric ERCP by Dr. Kallie Locks at [facility] in May-June 2013. She was then diagnosed with a marginal ulcer in July 2013. Ultimately the GEJ was revised and  the ulcer was resected 08/18/2012. She then had a recurrent marginal ulcer and had a gastric bypass reversal on October, 2015. She has had a feeding J-tube since then to help with nutrition during the flare ups of pancreatitis. The tube is not currently in place and she has been able to maintain her weight with PO intake. She has been managed by Dr. Satira Mccallum at [facility] with regards to her pain management - however has not showed  up to her last appointments and she thinks she has been dismissed from their practice. She has had 3 celiac plexus blocks without much improvement."  April 2019 EGD by Dr. Hervey Ard:  Findings:    The examined esophagus was normal.    One non-bleeding superficial gastric ulcer with no stigmata of bleeding     was found in the gastric antrum. The lesion was 10 mm in largest     dimension. The antrum was biopsied for H. pylori testing    The examined duodenum was normal. 100 units of botox was injected into     the pylorus as it appeared spasmed.   Previous gastric emptying studies:  June 2017 FINDINGS: Gastric retention is as follows; 93% at 10 minutes, 76% at 20 minutes, 60% at 30 minutes, 30% at 40 minutes. .    IMPRESSION: There is no evidence for delayed gastric emptying. Gastric emptying is faster than typical, possibly reflection of patient's gastric bypass.    April 2016 FINDINGS: Gastric emptying was determined following oral ingestion of 1.0 millicuries of technetium labeled sulfur colloid mixed in eggs with toast  At 10 minutes after ingestion, 73 percent of activity remains within the stomach; at 30 minutes, 65 percent remains; at 50 minutes, 65 percent remains; and at 80 minutes, 50 percent of activity remains within the stomach.  IMPRESSION: Initially, normal gastric emptying with a mild degree of delayed emptying at the later time points.   November 2015 FINDINGS: The gastric emptying curve is well within normal limits. There is no evidence for delayed emptying.  IMPRESSION: Normal. No evidence for delayed emptying.   Colonoscopy 2015 By Dr. Gustavo Lah for abdominal pain and diarrhea Reported diverticulosis and random colon biopsies did not show any pathologic changes  Current Outpatient Medications  Medication Sig Dispense Refill  . albuterol (VENTOLIN HFA) 108 (90 Base) MCG/ACT inhaler     . ASMANEX HFA 100 MCG/ACT AERO     . clonazePAM  (KLONOPIN) 1 MG tablet Take 1 mg by mouth 2 (two) times daily.    . fentaNYL (DURAGESIC) 12 MCG/HR Place 1 patch onto the skin every 3 (three) days. 10 patch 0  . medroxyPROGESTERone (DEPO-PROVERA) 150 MG/ML injection Inject 1 mL (150 mg total) into the muscle every 3 (three) months. 1 mL 0  . mirtazapine (REMERON) 45 MG tablet Take 1 tablet (45 mg total) by mouth at bedtime. 90 tablet 0  . prazosin (MINIPRESS) 2 MG capsule Take 2 mg by mouth at bedtime.    . pregabalin (LYRICA) 50 MG capsule Take 1 capsule (50 mg total) by mouth 3 (three) times daily. 90 capsule 3  . venlafaxine XR (EFFEXOR-XR) 150 MG 24 hr capsule TAKE 2 CAPSULES BY MOUTH IN THE MORNING    . Butalbital-APAP-Caff-Cod 50-300-40-30 MG CAPS TAKE ONE TAB PO QD PRN FOR PAIN (Patient not taking: Reported on 10/05/2018) 20 capsule 1  . CREON 36000 UNITS CPEP capsule      No current facility-administered medications for this visit.     Allergies as of  10/05/2018 - Review Complete 10/05/2018  Allergen Reaction Noted  . Fentanyl Itching 03/27/2015  . Morphine and related Itching 03/27/2015  . Vicodin [hydrocodone-acetaminophen] Itching 03/27/2015    ROS:  General: Negative for anorexia, weight loss, fever, chills, fatigue, weakness. ENT: Negative for hoarseness, difficulty swallowing , nasal congestion. CV: Negative for chest pain, angina, palpitations, dyspnea on exertion, peripheral edema.  Respiratory: Negative for dyspnea at rest, dyspnea on exertion, cough, sputum, wheezing.  GI: See history of present illness. GU:  Negative for dysuria, hematuria, urinary incontinence, urinary frequency, nocturnal urination.  Endo: Negative for unusual weight change.    Physical Examination:   BP (!) 145/99   Pulse (!) 101   Ht 6\' 1"  (1.854 m)   Wt 174 lb 6.4 oz (79.1 kg)   BMI 23.01 kg/m   General: Well-nourished, well-developed in no acute distress.  Eyes: No icterus. Conjunctivae pink. Mouth: Oropharyngeal mucosa moist and  pink , no lesions erythema or exudate. Neck: Supple, Trachea midline Abdomen: Bowel sounds are normal, nontender, nondistended, no hepatosplenomegaly or masses, no abdominal bruits or hernia , no rebound or guarding.   Extremities: No lower extremity edema. No clubbing or deformities. Neuro: Alert and oriented x 3.  Grossly intact. Skin: Warm and dry, no jaundice.   Psych: Alert and cooperative, normal mood and affect.   Labs: CMP     Component Value Date/Time   NA 142 08/19/2018 1010   NA 138 07/07/2014 0415   K 4.2 08/19/2018 1010   K 3.6 07/07/2014 0415   CL 108 (H) 08/19/2018 1010   CL 102 07/07/2014 0415   CO2 19 (L) 08/19/2018 1010   CO2 28 07/07/2014 0415   GLUCOSE 84 08/19/2018 1010   GLUCOSE 86 04/07/2016 1645   GLUCOSE 156 (H) 07/07/2014 0415   BUN 9 08/19/2018 1010   BUN 10 07/07/2014 0415   CREATININE 0.59 08/19/2018 1010   CREATININE 0.72 07/07/2014 0415   CALCIUM 8.8 08/19/2018 1010   CALCIUM 9.5 07/07/2014 0415   PROT 6.7 08/19/2018 1010   PROT 8.4 (H) 07/07/2014 0415   ALBUMIN 4.5 08/19/2018 1010   ALBUMIN 3.9 07/07/2014 0415   AST 19 08/19/2018 1010   AST 13 (L) 07/07/2014 0415   ALT 24 08/19/2018 1010   ALT 34 07/07/2014 0415   ALKPHOS 145 (H) 08/19/2018 1010   ALKPHOS 113 07/07/2014 0415   BILITOT 0.4 08/19/2018 1010   BILITOT 0.5 07/07/2014 0415   GFRNONAA 116 08/19/2018 1010   GFRNONAA >60 07/07/2014 0415   GFRNONAA >60 10/31/2013 1048   GFRAA 134 08/19/2018 1010   GFRAA >60 07/07/2014 0415   GFRAA >60 10/31/2013 1048   Lab Results  Component Value Date   WBC 7.5 08/19/2018   HGB 12.3 08/19/2018   HCT 40.5 08/19/2018   MCV 81 08/19/2018   PLT 267 08/19/2018    Imaging Studies: No results found.  Assessment and Plan:   Anola Pineda is a 38 y.o. y/o female with complicated past surgical history here for follow-up of nausea vomiting, with history of gastric bypass in 2012, with reversal in 2015 due to chronic ulcers, history of gallstone  pancreatitis requiring transgastric ERCP and cholecystectomy in 2013  Based on her EGD findings there are no strictures, and small bowel opening is wide open, pylorus is open, therefore Botox injections are unlikely to lead to any benefit.  In addition she reports Botox injections in the past have only led to improvement in symptoms for 7 to 10 days.  We will does obtain gastric emptying study due to her intermittent nausea and vomiting However, I do suspect that her symptoms may be related to her anatomy where the small bowel opening is at a sharp angle instead of straight down.  I have encouraged small frequent meals instead of large meals.  In addition, we will obtain testing for SIBO given that she reports diarrhea an hour after eating, and also intermittent nausea vomiting.  Given her history of multiple surgeries, SIBO is on the differential.  She was on pancreatic enzymes previously and this does not help and so she is not on it currently.  We will await SIBO testing before considering restarting pancreatic enzymes.  Marijuana use was discouraged again  Over 50% of the time was spent discussing patient's symptoms, plan of care, answering her questions, and coordinating care.  Dr Stacy Pineda

## 2018-10-06 ENCOUNTER — Encounter: Payer: Self-pay | Admitting: Gastroenterology

## 2018-10-11 NOTE — Progress Notes (Signed)
[place]  Telephone:(336) [phone] Fax:(336) [phone]  ID: Stacy Pineda OB: 1980-09-19  MR#: [id number]  MVH#:[id number]  Patient Care Team: Ronnell Freshwater, NP as PCP - General (Family Medicine)  CHIEF COMPLAINT:  Iron deficiency anemia, beta thalassemia minor.  INTERVAL HISTORY: Patient returns to clinic today for repeat laboratory work, further evaluation, and consideration of additional IV Feraheme.  She currently feels well and is asymptomatic.  She denies any weakness or fatigue. She has no neurologic complaints. She has a good appetite and denies weight loss.  She denies any recent fevers or illnesses.  She denies any chest pain or shortness of breath. She denies any nausea, vomiting, constipation, or diarrhea.  She has no melena or hematochezia.  She has no urinary complaints.  Patient feels at her baseline offers no specific complaints today.  REVIEW OF SYSTEMS:   Review of Systems  Constitutional: Negative.  Negative for fever, malaise/fatigue and weight loss.  Respiratory: Negative.  Negative for cough and shortness of breath.   Cardiovascular: Negative.  Negative for chest pain and leg swelling.  Gastrointestinal: Negative.  Negative for abdominal pain, blood in stool and melena.  Genitourinary: Negative.  Negative for hematuria.  Musculoskeletal: Negative.  Negative for back pain.  Skin: Negative.  Negative for rash.  Neurological: Negative.  Negative for focal weakness, weakness and headaches.  Psychiatric/Behavioral: Negative.  The patient is not nervous/anxious.     As per HPI. Otherwise, a complete review of systems is negative.  PAST MEDICAL HISTORY: Past Medical History:  Diagnosis Date  . Anxiety   . Asthma   . Chronic headaches   . Depression   . Pancreatitis   . Pancreatitis   . PTSD (post-traumatic stress disorder)     PAST SURGICAL HISTORY: Past Surgical History:  Procedure Laterality Date  . ABDOMINAL SURGERY    .  CHOLECYSTECTOMY    . COLONOSCOPY WITH PROPOFOL N/A 09/08/2018   Procedure: COLONOSCOPY WITH PROPOFOL;  Surgeon: Virgel Manifold, MD;  Location: [facility] ENDOSCOPY;  Service: Endoscopy;  Laterality: N/A;  . DECUBITUS ULCER EXCISION    . DILATION AND CURETTAGE OF UTERUS    . ESOPHAGOGASTRODUODENOSCOPY (EGD) WITH PROPOFOL N/A 09/08/2018   Procedure: ESOPHAGOGASTRODUODENOSCOPY (EGD) WITH PROPOFOL;  Surgeon: Virgel Manifold, MD;  Location: [facility] ENDOSCOPY;  Service: Endoscopy;  Laterality: N/A;  . GASTRIC BYPASS    . GASTRIC BYPASS    . REVISION GASTRIC RESTRICTIVE PROCEDURE FOR MORBID OBESITY      FAMILY HISTORY Family History  Problem Relation Age of Onset  . Graves' disease Mother   . Hypertension Mother   . Hyperlipidemia Mother   . Anxiety disorder Father   . Prostate cancer Father   . Depression Brother   . Anxiety disorder Brother   . Anxiety disorder Maternal Aunt   . Depression Maternal Aunt   . Anxiety disorder Paternal Aunt   . Depression Paternal Aunt   . Anxiety disorder Maternal Uncle   . Depression Maternal Uncle   . Anxiety disorder Paternal Uncle   . Depression Paternal Uncle   . Anxiety disorder Maternal Grandfather   . Depression Maternal Grandfather   . Diabetes Maternal Grandfather   . Anxiety disorder Maternal Grandmother   . Depression Maternal Grandmother   . Breast cancer Maternal Grandmother   . Ovarian cancer Maternal Grandmother   . Diabetes Maternal Grandmother   . Colon cancer Neg Hx        ADVANCED DIRECTIVES:    HEALTH MAINTENANCE: Social History  Tobacco Use  . Smoking status: Current Some Day Smoker    Packs/day: 0.25    Types: Cigars, E-cigarettes, Cigarettes    Start date: 03/26/2005  . Smokeless tobacco: Never Used  . Tobacco comment: pt smoke weekly  Substance Use Topics  . Alcohol use: No    Alcohol/week: 0.0 standard drinks  . Drug use: Yes    Frequency: 3.0 times per week    Types: Marijuana    Comment: MJ use weekly       Allergies  Allergen Reactions  . Fentanyl Itching  . Morphine And Related Itching    Itchy.   . Vicodin [Hydrocodone-Acetaminophen] Itching    Current Outpatient Medications  Medication Sig Dispense Refill  . albuterol (VENTOLIN HFA) 108 (90 Base) MCG/ACT inhaler     . ASMANEX HFA 100 MCG/ACT AERO     . Butalbital-APAP-Caff-Cod 50-300-40-30 MG CAPS TAKE ONE TAB PO QD PRN FOR PAIN 20 capsule 1  . clonazePAM (KLONOPIN) 1 MG tablet Take 1 mg by mouth 2 (two) times daily.    Marland Kitchen CREON 36000 UNITS CPEP capsule     . fentaNYL (DURAGESIC) 12 MCG/HR Place 1 patch onto the skin every 3 (three) days. 10 patch 0  . medroxyPROGESTERone (DEPO-PROVERA) 150 MG/ML injection Inject 1 mL (150 mg total) into the muscle every 3 (three) months. 1 mL 0  . mirtazapine (REMERON) 45 MG tablet Take 1 tablet (45 mg total) by mouth at bedtime. 90 tablet 0  . Multiple Vitamins-Minerals (MULTIVITAMIN ADULT PO) Take 1 tablet by mouth.    . prazosin (MINIPRESS) 2 MG capsule Take 2 mg by mouth at bedtime.    . pregabalin (LYRICA) 50 MG capsule Take 1 capsule (50 mg total) by mouth 3 (three) times daily. 90 capsule 3  . promethazine (PHENERGAN) 6.25 MG/5ML syrup Take 10 mLs by mouth every 8 (eight) hours as needed.    Marland Kitchen Ubrogepant (UBRELVY) 50 MG TABS Take 50 mg by mouth 2 (two) times daily as needed. 10 tablet 2  . venlafaxine XR (EFFEXOR-XR) 150 MG 24 hr capsule TAKE 2 CAPSULES BY MOUTH IN THE MORNING    . venlafaxine XR (EFFEXOR-XR) 75 MG 24 hr capsule Take 2 capsules by mouth.     No current facility-administered medications for this visit.     OBJECTIVE: Vitals:   10/15/18 1112  BP: 128/81  Pulse: 73  Temp: (!) 97 F (36.1 C)     Body mass index is 23.42 kg/m.    ECOG FS:0 - Asymptomatic  General: Well-developed, well-nourished, no acute distress. Eyes: Pink conjunctiva, anicteric sclera. HEENT: Normocephalic, moist mucous membranes. Lungs: Clear to auscultation bilaterally. Heart: Regular rate  and rhythm. No rubs, murmurs, or gallops. Abdomen: Soft, nontender, nondistended. No organomegaly noted, normoactive bowel sounds. Musculoskeletal: No edema, cyanosis, or clubbing. Neuro: Alert, answering all questions appropriately. Cranial nerves grossly intact. Skin: No rashes or petechiae noted. Psych: Normal affect.  LAB RESULTS:  Lab Results  Component Value Date   NA 142 08/19/2018   K 4.2 08/19/2018   CL 108 (H) 08/19/2018   CO2 19 (L) 08/19/2018   GLUCOSE 84 08/19/2018   BUN 9 08/19/2018   CREATININE 0.59 08/19/2018   CALCIUM 8.8 08/19/2018   PROT 6.7 08/19/2018   ALBUMIN 4.5 08/19/2018   AST 19 08/19/2018   ALT 24 08/19/2018   ALKPHOS 145 (H) 08/19/2018   BILITOT 0.4 08/19/2018   GFRNONAA 116 08/19/2018   GFRAA 134 08/19/2018    Lab Results  Component  Value Date   WBC 7.9 10/15/2018   NEUTROABS 4.7 10/15/2018   HGB 11.8 (L) 10/15/2018   HCT 35.1 (L) 10/15/2018   MCV 75.2 (L) 10/15/2018   PLT 277 10/15/2018   Lab Results  Component Value Date   IRON 117 10/15/2018   TIBC 357 10/15/2018   IRONPCTSAT 33 (H) 10/15/2018    Lab Results  Component Value Date   FERRITIN 16 10/15/2018     STUDIES: No results found.  ASSESSMENT: Iron deficiency anemia, beta thalassemia minor.  PLAN:    1. Iron deficiency anemia: Patient's hemoglobin has improved to 11.7 and is now nearly within normal limits.  She likely has a baseline anemia given her underlying beta thalassemia.  Iron stores continue to be within normal limits. Previously, the remainder of her laboratory work was either negative or within normal limits.  She does not require additional Feraheme today.  Patient last received treatment on March 05, 2018.  Return to clinic in 4 months with repeat laboratory work and further evaluation.  If her hemoglobin and iron stores remain stable, she likely can be discharged from clinic. 2. Heavy menses: Resolved. Continue Depo Provera per gynecology.  3. Beta  thalassemia minor: Confirmed by hemoglobin electrophoresis.  Patient will likely have a baseline microcytosis. 4. Smudge cells: Previously noted on peripheral blood smear.  Peripheral blood flow cytometry was negative.  Patient expressed understanding and was in agreement with this plan. She also understands that She can call clinic at any time with any questions, concerns, or complaints.    Lloyd Huger, MD 10/16/18 3:58 PM

## 2018-10-12 ENCOUNTER — Ambulatory Visit: Payer: Self-pay | Admitting: Adult Health

## 2018-10-14 ENCOUNTER — Ambulatory Visit: Payer: Medicare Other | Admitting: Adult Health

## 2018-10-14 ENCOUNTER — Encounter: Payer: Self-pay | Admitting: Adult Health

## 2018-10-14 ENCOUNTER — Other Ambulatory Visit: Payer: Self-pay

## 2018-10-14 VITALS — BP 128/92 | HR 77 | Resp 16 | Ht 73.0 in | Wt 175.0 lb

## 2018-10-14 DIAGNOSIS — G8929 Other chronic pain: Secondary | ICD-10-CM | POA: Diagnosis not present

## 2018-10-14 DIAGNOSIS — I10 Essential (primary) hypertension: Secondary | ICD-10-CM | POA: Diagnosis not present

## 2018-10-14 DIAGNOSIS — G43809 Other migraine, not intractable, without status migrainosus: Secondary | ICD-10-CM

## 2018-10-14 DIAGNOSIS — R109 Unspecified abdominal pain: Secondary | ICD-10-CM | POA: Diagnosis not present

## 2018-10-14 MED ORDER — PREGABALIN 50 MG PO CAPS: 50.0000 mg | ORAL_CAPSULE | Freq: Three times a day (TID) | ORAL | 3 refills | Status: DC

## 2018-10-14 MED ORDER — UBROGEPANT 50 MG PO TABS: 50.0000 mg | ORAL_TABLET | Freq: Two times a day (BID) | ORAL | 2 refills | Status: DC | PRN

## 2018-10-14 MED ORDER — FENTANYL 12 MCG/HR TD PT72: 1.0000 | MEDICATED_PATCH | TRANSDERMAL | 0 refills | Status: DC

## 2018-10-14 NOTE — Progress Notes (Signed)
[facility] [place],  [id number]  Internal MEDICINE  Office Visit Note  Patient Name: Stacy Pineda  [id number]  [id number]  Date of Service: 10/14/2018  Chief Complaint  Patient presents with  . Abdominal Pain    4 week follow up, still about the same     HPI Pt is here for 4 week follow up on abdominal pain. She reports she saw GI who did colonoscopy and endoscopy last month.  She was told she had hyperplasia and chronic gastritis.  The plan is to do a Cbo test as well as a gastric emptying test.  If nothing comes of those tests, she may have to have surgery to correct her anatomical position. She continues to report pain, that is worsening on the third day.  She reports having trouble keeping her patches on.     Current Medication: Outpatient Encounter Medications as of 10/14/2018  Medication Sig Note  . albuterol (VENTOLIN HFA) 108 (90 Base) MCG/ACT inhaler  06/28/2015: Received from: [facility]  . ASMANEX HFA 100 MCG/ACT AERO  06/28/2015: Received from: External Pharmacy  . clonazePAM (KLONOPIN) 1 MG tablet Take 1 mg by mouth 2 (two) times daily.   Marland Kitchen CREON 36000 UNITS CPEP capsule  08/17/2018: Out. Pt states she did not notice a difference when she took it.  . fentaNYL (DURAGESIC) 12 MCG/HR Place 1 patch onto the skin every 3 (three) days.   . medroxyPROGESTERone (DEPO-PROVERA) 150 MG/ML injection Inject 1 mL (150 mg total) into the muscle every 3 (three) months.   . mirtazapine (REMERON) 45 MG tablet Take 1 tablet (45 mg total) by mouth at bedtime.   . prazosin (MINIPRESS) 2 MG capsule Take 2 mg by mouth at bedtime.   . pregabalin (LYRICA) 50 MG capsule Take 1 capsule (50 mg total) by mouth 3 (three) times daily.   Marland Kitchen venlafaxine XR (EFFEXOR-XR) 150 MG 24 hr capsule TAKE 2 CAPSULES BY MOUTH IN THE MORNING   . [DISCONTINUED] fentaNYL (DURAGESIC) 12 MCG/HR Place 1 patch onto the skin every 3 (three) days.   . [DISCONTINUED] pregabalin (LYRICA) 50 MG  capsule Take 1 capsule (50 mg total) by mouth 3 (three) times daily.   . Butalbital-APAP-Caff-Cod 50-300-40-30 MG CAPS TAKE ONE TAB PO QD PRN FOR PAIN (Patient not taking: Reported on 10/05/2018)   . Ubrogepant (UBRELVY) 50 MG TABS Take 50 mg by mouth 2 (two) times daily as needed.    No facility-administered encounter medications on file as of 10/14/2018.     Surgical History: Past Surgical History:  Procedure Laterality Date  . ABDOMINAL SURGERY    . CHOLECYSTECTOMY    . COLONOSCOPY WITH PROPOFOL N/A 09/08/2018   Procedure: COLONOSCOPY WITH PROPOFOL;  Surgeon: Virgel Manifold, MD;  Location: [facility];  Service: Endoscopy;  Laterality: N/A;  . DECUBITUS ULCER EXCISION    . DILATION AND CURETTAGE OF UTERUS    . ESOPHAGOGASTRODUODENOSCOPY (EGD) WITH PROPOFOL N/A 09/08/2018   Procedure: ESOPHAGOGASTRODUODENOSCOPY (EGD) WITH PROPOFOL;  Surgeon: Virgel Manifold, MD;  Location: [facility];  Service: Endoscopy;  Laterality: N/A;  . GASTRIC BYPASS    . GASTRIC BYPASS    . REVISION GASTRIC RESTRICTIVE PROCEDURE FOR MORBID OBESITY      Medical History: Past Medical History:  Diagnosis Date  . Anxiety   . Asthma   . Chronic headaches   . Depression   . Pancreatitis   . Pancreatitis   . PTSD (post-traumatic stress disorder)  Family History: Family History  Problem Relation Age of Onset  . Graves' disease Mother   . Hypertension Mother   . Hyperlipidemia Mother   . Anxiety disorder Father   . Prostate cancer Father   . Depression Brother   . Anxiety disorder Brother   . Anxiety disorder Maternal Aunt   . Depression Maternal Aunt   . Anxiety disorder Paternal Aunt   . Depression Paternal Aunt   . Anxiety disorder Maternal Uncle   . Depression Maternal Uncle   . Anxiety disorder Paternal Uncle   . Depression Paternal Uncle   . Anxiety disorder Maternal Grandfather   . Depression Maternal Grandfather   . Diabetes Maternal Grandfather   . Anxiety disorder  Maternal Grandmother   . Depression Maternal Grandmother   . Breast cancer Maternal Grandmother   . Ovarian cancer Maternal Grandmother   . Diabetes Maternal Grandmother   . Colon cancer Neg Hx     Social History   Socioeconomic History  . Marital status: Single    Spouse name: Not on file  . Number of children: Not on file  . Years of education: Not on file  . Highest education level: Not on file  Occupational History  . Not on file  Social Needs  . Financial resource strain: Not on file  . Food insecurity:    Worry: Not on file    Inability: Not on file  . Transportation needs:    Medical: Not on file    Non-medical: Not on file  Tobacco Use  . Smoking status: Current Some Day Smoker    Packs/day: 0.25    Types: Cigars, E-cigarettes, Cigarettes    Start date: 03/26/2005  . Smokeless tobacco: Never Used  . Tobacco comment: pt smoke weekly  Substance and Sexual Activity  . Alcohol use: No    Alcohol/week: 0.0 standard drinks  . Drug use: Yes    Frequency: 3.0 times per week    Types: Marijuana    Comment: MJ use weekly  . Sexual activity: Not Currently    Birth control/protection: Injection  Lifestyle  . Physical activity:    Days per week: Not on file    Minutes per session: Not on file  . Stress: Not on file  Relationships  . Social connections:    Talks on phone: Not on file    Gets together: Not on file    Attends religious service: Not on file    Active member of club or organization: Not on file    Attends meetings of clubs or organizations: Not on file    Relationship status: Not on file  . Intimate partner violence:    Fear of current or ex partner: Not on file    Emotionally abused: Not on file    Physically abused: Not on file    Forced sexual activity: Not on file  Other Topics Concern  . Not on file  Social History Narrative  . Not on file      Review of Systems  Constitutional: Negative for chills, fatigue and unexpected weight change.   HENT: Negative for congestion, rhinorrhea, sneezing and sore throat.   Eyes: Negative for photophobia, pain and redness.  Respiratory: Negative for cough, chest tightness and shortness of breath.   Cardiovascular: Negative for chest pain and palpitations.  Gastrointestinal: Negative for abdominal pain, constipation, diarrhea, nausea and vomiting.  Endocrine: Negative.   Genitourinary: Negative for dysuria and frequency.  Musculoskeletal: Negative for arthralgias, back pain, joint  swelling and neck pain.  Skin: Negative for rash.  Allergic/Immunologic: Negative.   Neurological: Negative for tremors and numbness.  Hematological: Negative for adenopathy. Does not bruise/bleed easily.  Psychiatric/Behavioral: Negative for behavioral problems and sleep disturbance. The patient is not nervous/anxious.     Vital Signs: BP (!) 128/92   Pulse 77   Resp 16   Ht 6\' 1"  (1.854 m)   Wt 175 lb (79.4 kg)   SpO2 96%   BMI 23.09 kg/m    Physical Exam Vitals signs and nursing note reviewed.  Constitutional:      General: She is not in acute distress.    Appearance: She is well-developed. She is not diaphoretic.  HENT:     Head: Normocephalic and atraumatic.     Mouth/Throat:     Pharynx: No oropharyngeal exudate.  Eyes:     Pupils: Pupils are equal, round, and reactive to light.  Neck:     Musculoskeletal: Normal range of motion and neck supple.     Thyroid: No thyromegaly.     Vascular: No JVD.     Trachea: No tracheal deviation.  Cardiovascular:     Rate and Rhythm: Normal rate and regular rhythm.     Heart sounds: Normal heart sounds. No murmur. No friction rub. No gallop.   Pulmonary:     Effort: Pulmonary effort is normal. No respiratory distress.     Breath sounds: Normal breath sounds. No wheezing or rales.  Chest:     Chest wall: No tenderness.  Abdominal:     Palpations: Abdomen is soft.     Tenderness: There is no abdominal tenderness. There is no guarding.   Musculoskeletal: Normal range of motion.  Lymphadenopathy:     Cervical: No cervical adenopathy.  Skin:    General: Skin is warm and dry.  Neurological:     Mental Status: She is alert and oriented to person, place, and time.     Cranial Nerves: No cranial nerve deficit.  Psychiatric:        Behavior: Behavior normal.        Thought Content: Thought content normal.        Judgment: Judgment normal.     Assessment/Plan: 1. Chronic abdominal pain Refilled patients fentanyl.  Pt will continue to follow up with GI.  - fentaNYL (DURAGESIC) 12 MCG/HR; Place 1 patch onto the skin every 3 (three) days.  Dispense: 10 patch; Refill: 0 We discussed ways that she could keep her fentanyl patch is applied.  I showed her a product called Tegaderm on [employer] that could help her keep the patches on during their time of use.  2. Essential (primary) hypertension Having some elevated bp in office today.  Repeated 150/90. Continue to follow.   3. Other migraine without status migrainosus, not intractable Gave pt sample of ubrelvy and sent RX if she decides it works well.  - Ubrogepant (UBRELVY) 50 MG TABS; Take 50 mg by mouth 2 (two) times daily as needed.  Dispense: 10 tablet; Refill: 2  General Counseling: Stacy Pineda verbalizes understanding of the findings of todays visit and agrees with plan of treatment. I have discussed any further diagnostic evaluation that may be needed or ordered today. We also reviewed her medications today. she has been encouraged to call the office with any questions or concerns that should arise related to todays visit.    No orders of the defined types were placed in this encounter.   Meds ordered this encounter  Medications  .  fentaNYL (DURAGESIC) 12 MCG/HR    Sig: Place 1 patch onto the skin every 3 (three) days.    Dispense:  10 patch    Refill:  0  . Ubrogepant (UBRELVY) 50 MG TABS    Sig: Take 50 mg by mouth 2 (two) times daily as needed.    Dispense:  10 tablet     Refill:  2  . pregabalin (LYRICA) 50 MG capsule    Sig: Take 1 capsule (50 mg total) by mouth 3 (three) times daily.    Dispense:  90 capsule    Refill:  3    Time spent: 25 Minutes   This patient was seen by Orson Gear AGNP-C in Collaboration with Dr Lavera Guise as a part of collaborative care agreement     Kendell Bane AGNP-C Internal medicine

## 2018-10-14 NOTE — Patient Instructions (Signed)

## 2018-10-15 ENCOUNTER — Inpatient Hospital Stay (HOSPITAL_BASED_OUTPATIENT_CLINIC_OR_DEPARTMENT_OTHER): Payer: Medicare Other | Admitting: Oncology

## 2018-10-15 ENCOUNTER — Inpatient Hospital Stay: Payer: Medicare Other

## 2018-10-15 ENCOUNTER — Inpatient Hospital Stay: Payer: Medicare Other | Attending: Oncology

## 2018-10-15 ENCOUNTER — Other Ambulatory Visit: Payer: Self-pay

## 2018-10-15 ENCOUNTER — Encounter: Payer: Self-pay | Admitting: Oncology

## 2018-10-15 VITALS — BP 128/81 | HR 73 | Temp 97.0°F | Ht 73.0 in | Wt 177.5 lb

## 2018-10-15 DIAGNOSIS — D563 Thalassemia minor: Secondary | ICD-10-CM | POA: Insufficient documentation

## 2018-10-15 DIAGNOSIS — D509 Iron deficiency anemia, unspecified: Secondary | ICD-10-CM

## 2018-10-15 LAB — IRON AND TIBC
Iron: 117 ug/dL (ref 28–170)
Saturation Ratios: 33 % — ABNORMAL HIGH (ref 10.4–31.8)
TIBC: 357 ug/dL (ref 250–450)
UIBC: 240 ug/dL

## 2018-10-15 LAB — CBC WITH DIFFERENTIAL/PLATELET
Abs Immature Granulocytes: 0.06 10*3/uL (ref 0.00–0.07)
Basophils Absolute: 0 10*3/uL (ref 0.0–0.1)
Basophils Relative: 1 %
Eosinophils Absolute: 0.2 10*3/uL (ref 0.0–0.5)
Eosinophils Relative: 2 %
HCT: 35.1 % — ABNORMAL LOW (ref 36.0–46.0)
Hemoglobin: 11.8 g/dL — ABNORMAL LOW (ref 12.0–15.0)
Immature Granulocytes: 1 %
Lymphocytes Relative: 31 %
Lymphs Abs: 2.4 10*3/uL (ref 0.7–4.0)
MCH: 25.3 pg — AB (ref 26.0–34.0)
MCHC: 33.6 g/dL (ref 30.0–36.0)
MCV: 75.2 fL — ABNORMAL LOW (ref 80.0–100.0)
Monocytes Absolute: 0.5 10*3/uL (ref 0.1–1.0)
Monocytes Relative: 6 %
Neutro Abs: 4.7 10*3/uL (ref 1.7–7.7)
Neutrophils Relative %: 59 %
PLATELETS: 277 10*3/uL (ref 150–400)
RBC: 4.67 MIL/uL (ref 3.87–5.11)
RDW: 19.2 % — ABNORMAL HIGH (ref 11.5–15.5)
WBC: 7.9 10*3/uL (ref 4.0–10.5)
nRBC: 0.9 % — ABNORMAL HIGH (ref 0.0–0.2)

## 2018-10-15 LAB — FERRITIN: FERRITIN: 16 ng/mL (ref 11–307)

## 2018-10-15 NOTE — Progress Notes (Signed)
Patient is here today to follow up on her Iron deficiency anemia. Patient stated that she been doing well with no complaints.

## 2018-10-19 ENCOUNTER — Ambulatory Visit
Admission: RE | Admit: 2018-10-19 | Discharge: 2018-10-19 | Disposition: A | Discharge status: Home / Self Care | Payer: Medicare Other | Source: Ambulatory Visit | Attending: Gastroenterology | Admitting: Gastroenterology

## 2018-10-19 ENCOUNTER — Other Ambulatory Visit: Payer: Self-pay

## 2018-10-19 ENCOUNTER — Other Ambulatory Visit: Payer: Self-pay | Admitting: Internal Medicine

## 2018-10-19 DIAGNOSIS — R112 Nausea with vomiting, unspecified: Secondary | ICD-10-CM | POA: Insufficient documentation

## 2018-10-19 MED ORDER — TECHNETIUM TC 99M SULFUR COLLOID
2.4500 | Freq: Once | INTRAVENOUS | Status: AC | PRN
  Administered 2018-10-19: 2.45 via INTRAVENOUS

## 2018-10-20 NOTE — Telephone Encounter (Signed)
Do you want her continue

## 2018-10-22 ENCOUNTER — Telehealth: Payer: Self-pay | Admitting: Gastroenterology

## 2018-10-22 ENCOUNTER — Telehealth: Payer: Self-pay

## 2018-10-22 NOTE — Telephone Encounter (Signed)
LEFT VM FOR PT TO CALL OFFICE AND SCHEDULE APT FOR BANDING

## 2018-10-22 NOTE — Telephone Encounter (Signed)
-----   Message from Virgel Manifold, MD sent at 10/19/2018  2:05 PM EDT ----- Jackelyn Poling please let patient know, the gastric emptying study does not show delayed gastric emptying which is good.  She does not need Botox injections.  Please schedule her for the small intestinal bowel overgrowth study, which is the breath test study.  Please talk to Sherald Hess about this as she knows where she schedules the patients for this.

## 2018-10-22 NOTE — Telephone Encounter (Signed)
Pt notified and mailed SIBO lab to pt to sign and fax. Unable to send via my chart.

## 2018-10-28 ENCOUNTER — Other Ambulatory Visit: Payer: Self-pay

## 2018-10-28 ENCOUNTER — Telehealth: Payer: Self-pay | Admitting: Certified Nurse Midwife

## 2018-10-28 MED ORDER — MEDROXYPROGESTERONE ACETATE 150 MG/ML IM SUSP: 150.0000 mg | INTRAMUSCULAR | 0 refills | Status: DC

## 2018-10-28 NOTE — Telephone Encounter (Signed)
The patient called and asked for a refill of her depo so she can bring it with her tomorrow to her appointment.  She asked for someone to let her know it was sent over so she can get it today, please advise, thanks.

## 2018-10-28 NOTE — Telephone Encounter (Signed)
Pt was called and informed that her depo had be refilled and sent to the pharmacy [facility] in [place].

## 2018-10-29 ENCOUNTER — Ambulatory Visit (INDEPENDENT_AMBULATORY_CARE_PROVIDER_SITE_OTHER): Payer: Medicare Other | Admitting: Certified Nurse Midwife

## 2018-10-29 ENCOUNTER — Other Ambulatory Visit: Payer: Self-pay

## 2018-10-29 VITALS — BP 110/72 | HR 79 | Ht 73.0 in | Wt 173.4 lb

## 2018-10-29 DIAGNOSIS — Z803 Family history of malignant neoplasm of breast: Secondary | ICD-10-CM

## 2018-10-29 DIAGNOSIS — N921 Excessive and frequent menstruation with irregular cycle: Secondary | ICD-10-CM | POA: Insufficient documentation

## 2018-10-29 DIAGNOSIS — Z87898 Personal history of other specified conditions: Secondary | ICD-10-CM

## 2018-10-29 DIAGNOSIS — Z01419 Encounter for gynecological examination (general) (routine) without abnormal findings: Secondary | ICD-10-CM

## 2018-10-29 DIAGNOSIS — Z1231 Encounter for screening mammogram for malignant neoplasm of breast: Secondary | ICD-10-CM

## 2018-10-29 DIAGNOSIS — Z8742 Personal history of other diseases of the female genital tract: Secondary | ICD-10-CM | POA: Insufficient documentation

## 2018-10-29 DIAGNOSIS — Z3042 Encounter for surveillance of injectable contraceptive: Secondary | ICD-10-CM

## 2018-10-29 DIAGNOSIS — Z8489 Family history of other specified conditions: Secondary | ICD-10-CM

## 2018-10-29 DIAGNOSIS — R102 Pelvic and perineal pain: Secondary | ICD-10-CM

## 2018-10-29 MED ORDER — MEDROXYPROGESTERONE ACETATE 150 MG/ML IM SUSP
150.0000 mg | Freq: Once | INTRAMUSCULAR | Status: AC
  Administered 2018-10-29: 150 mg via INTRAMUSCULAR

## 2018-10-29 NOTE — Progress Notes (Signed)
ANNUAL PREVENTATIVE CARE GYN  ENCOUNTER NOTE  Subjective:       Stacy Pineda is a 38 y.o. G43P0010 female here for a routine annual gynecologic exam.  Current complaints: 1. Breakthrough bleeding on Depo-provera  2. Pelvic pain with history of ovarian cyst 3. History of ovarian cyst 4. Family history of breast cancer 5. Family history of uterine cancer  Denies difficulty breathing or respiratory distress, chest pain, abdominal pain, excessive vaginal bleeding, dysuria, and leg pain or swelling.    Gynecologic History  No LMP recorded. Patient has had an injection.  Contraception: Depo-Provera injections  Last Pap: 2016. Results were: Normal/Normal  Last mammogram: due.   Obstetric History  OB History  Gravida Para Term Preterm AB Living  1       1    SAB TAB Ectopic Multiple Live Births  1            # Outcome Date GA Lbr Len/2nd Weight Sex Delivery Anes PTL Lv  1 SAB 1999            Past Medical History:  Diagnosis Date  . Anxiety   . Asthma   . Chronic headaches   . Depression   . Pancreatitis   . PTSD (post-traumatic stress disorder)     Past Surgical History:  Procedure Laterality Date  . ABDOMINAL SURGERY    . CHOLECYSTECTOMY    . COLONOSCOPY WITH PROPOFOL N/A 09/08/2018   Procedure: COLONOSCOPY WITH PROPOFOL;  Surgeon: Virgel Manifold, MD;  Location: [facility];  Service: Endoscopy;  Laterality: N/A;  . DECUBITUS ULCER EXCISION    . DILATION AND CURETTAGE OF UTERUS    . ESOPHAGOGASTRODUODENOSCOPY (EGD) WITH PROPOFOL N/A 09/08/2018   Procedure: ESOPHAGOGASTRODUODENOSCOPY (EGD) WITH PROPOFOL;  Surgeon: Virgel Manifold, MD;  Location: [facility];  Service: Endoscopy;  Laterality: N/A;  . GASTRIC BYPASS    . GASTRIC BYPASS    . REVISION GASTRIC RESTRICTIVE PROCEDURE FOR MORBID OBESITY      Current Outpatient Medications on File Prior to Visit  Medication Sig Dispense Refill  . albuterol (VENTOLIN HFA) 108 (90 Base) MCG/ACT inhaler     .  ASMANEX HFA 100 MCG/ACT AERO     . Butalbital-APAP-Caff-Cod 50-300-40-30 MG CAPS TAKE ONE TAB PO QD PRN FOR PAIN 20 capsule 1  . clonazePAM (KLONOPIN) 1 MG tablet Take 1 mg by mouth 2 (two) times daily.    . medroxyPROGESTERone (DEPO-PROVERA) 150 MG/ML injection Inject 1 mL (150 mg total) into the muscle every 3 (three) months. 1 mL 0  . mirtazapine (REMERON) 45 MG tablet Take 1 tablet (45 mg total) by mouth at bedtime. 90 tablet 0  . Multiple Vitamins-Minerals (MULTIVITAMIN ADULT PO) Take 1 tablet by mouth.    . prazosin (MINIPRESS) 2 MG capsule Take 2 mg by mouth at bedtime.    . pregabalin (LYRICA) 50 MG capsule Take 1 capsule (50 mg total) by mouth 3 (three) times daily. 90 capsule 3  . promethazine (PHENERGAN) 6.25 MG/5ML syrup TAKE  10 ML BY MOUTH EVERY 8 HOURS AS NEEDED FOR NAUSEA AND VOMITING OR  REFRACTORY  NAUSEA/VOMITING 240 mL 0  . venlafaxine XR (EFFEXOR-XR) 150 MG 24 hr capsule TAKE 2 CAPSULES BY MOUTH IN THE MORNING    . fentaNYL (DURAGESIC) 12 MCG/HR Place 1 patch onto the skin every 3 (three) days. 10 patch 0  . Ubrogepant (UBRELVY) 50 MG TABS Take 50 mg by mouth 2 (two) times daily as needed. (Patient not taking:  Reported on 10/29/2018) 10 tablet 2   No current facility-administered medications on file prior to visit.     Allergies  Allergen Reactions  . Fentanyl Itching  . Morphine And Related Itching    Itchy.   . Vicodin [Hydrocodone-Acetaminophen] Itching    Social History   Socioeconomic History  . Marital status: Single    Spouse name: Not on file  . Number of children: Not on file  . Years of education: Not on file  . Highest education level: Not on file  Occupational History  . Not on file  Social Needs  . Financial resource strain: Not on file  . Food insecurity:    Worry: Not on file    Inability: Not on file  . Transportation needs:    Medical: Not on file    Non-medical: Not on file  Tobacco Use  . Smoking status: Current Some Day Smoker     Packs/day: 0.25    Types: Cigars, E-cigarettes, Cigarettes    Start date: 03/26/2005  . Smokeless tobacco: Never Used  . Tobacco comment: pt smoke weekly  Substance and Sexual Activity  . Alcohol use: No    Alcohol/week: 0.0 standard drinks  . Drug use: Yes    Frequency: 3.0 times per week    Types: Marijuana    Comment: MJ use weekly  . Sexual activity: Not Currently    Birth control/protection: Injection  Lifestyle  . Physical activity:    Days per week: Not on file    Minutes per session: Not on file  . Stress: Not on file  Relationships  . Social connections:    Talks on phone: Not on file    Gets together: Not on file    Attends religious service: Not on file    Active member of club or organization: Not on file    Attends meetings of clubs or organizations: Not on file    Relationship status: Not on file  . Intimate partner violence:    Fear of current or ex partner: Not on file    Emotionally abused: Not on file    Physically abused: Not on file    Forced sexual activity: Not on file  Other Topics Concern  . Not on file  Social History Narrative  . Not on file    Family History  Problem Relation Age of Onset  . Graves' disease Mother   . Hypertension Mother   . Hyperlipidemia Mother   . Anxiety disorder Father   . Prostate cancer Father   . Depression Brother   . Anxiety disorder Brother   . Anxiety disorder Maternal Aunt   . Depression Maternal Aunt   . Anxiety disorder Paternal Aunt   . Depression Paternal Aunt   . Anxiety disorder Maternal Uncle   . Depression Maternal Uncle   . Anxiety disorder Paternal Uncle   . Depression Paternal Uncle   . Anxiety disorder Maternal Grandfather   . Depression Maternal Grandfather   . Diabetes Maternal Grandfather   . Anxiety disorder Maternal Grandmother   . Depression Maternal Grandmother   . Breast cancer Maternal Grandmother   . Ovarian cancer Maternal Grandmother   . Diabetes Maternal Grandmother   .  Colon cancer Neg Hx     The following portions of the patient's history were reviewed and updated as appropriate: allergies, current medications, past family history, past medical history, past social history, past surgical history and problem list.  Review of Systems  ROS negative  as noted above. Information obtained from patient.    Objective:   BP 110/72   Pulse 79   Ht 6\' 1"  (1.854 m)   Wt 173 lb 7 oz (78.7 kg)   BMI 22.88 kg/m    CONSTITUTIONAL: Well-developed, well-nourished female in no acute distress.   PSYCHIATRIC: Normal mood and affect. Normal behavior. Normal judgment and thought content.  [place]: Alert and oriented to person, place, and time. Normal muscle tone coordination. No cranial nerve deficit noted.  HENT:  Normocephalic, atraumatic, External right and left ear normal.   EYES: Conjunctivae and EOM are normal. Pupils are equal and round.   NECK: Normal range of motion, supple, no masses.  Normal thyroid.   SKIN: Skin is warm and dry. No rash noted. Not diaphoretic. No erythema. No pallor. Professional tattoos present.   CARDIOVASCULAR: Normal heart rate noted, regular rhythm, no murmur.  RESPIRATORY: Clear to auscultation bilaterally. Effort and breath sounds normal, no problems with respiration noted.  BREASTS: Symmetric in size. No masses, skin changes, nipple drainage, or lymphadenopathy.  ABDOMEN: Soft, normal bowel sounds, no distention  noted.  No tenderness, rebound or guarding.   PELVIC:  External Genitalia: Normal  Vagina: Normal  Cervix: Normal  Uterus: Normal  Adnexa: Normal    MUSCULOSKELETAL: Normal range of motion. No tenderness.  No cyanosis, clubbing, or edema.  2+ distal pulses.  LYMPHATIC: No Axillary, Supraclavicular, or Inguinal Adenopathy.  Assessment:   Annual gynecologic examination 38 y.o.   Contraception: Depo-Provera injections   Overweight   Problem List Items Addressed This Visit      Other   Family  history of breast cancer   Relevant Orders   MM 3D SCREEN BREAST BILATERAL   Breakthrough bleeding on depo provera   Relevant Orders   US PELVIS TRANSVANGINAL NON-OB (TV ONLY)   US PELVIS (TRANSABDOMINAL ONLY)   Family history of uterine fibroid   Relevant Orders   US PELVIS TRANSVANGINAL NON-OB (TV ONLY)   US PELVIS (TRANSABDOMINAL ONLY)   History of ovarian cyst   Relevant Orders   US PELVIS TRANSVANGINAL NON-OB (TV ONLY)   US PELVIS (TRANSABDOMINAL ONLY)    Other Visit Diagnoses    Well woman exam    -  Primary   Relevant Orders   US PELVIS TRANSVANGINAL NON-OB (TV ONLY)   US PELVIS (TRANSABDOMINAL ONLY)   MM 3D SCREEN BREAST BILATERAL   Depo-Provera contraceptive status       Visit for screening mammogram       Relevant Orders   MM 3D SCREEN BREAST BILATERAL   Pelvic pain       Relevant Orders   US PELVIS TRANSVANGINAL NON-OB (TV ONLY)   US PELVIS (TRANSABDOMINAL ONLY)      Plan:   Pap: Pap Co Test  Mammogram: Ordered  Labs: Declined  GYN ultrasounds: Ordered  Routine preventative health maintenance measures emphasized: Exercise/Diet/Weight control, Tobacco Warnings, Alcohol/Substance use risks and Stress Management; see AVS  Reviewed red flag symptoms and when to call  RTC x 10 weeks for Depo injection  RTC x 1 year for ANNUAL EXAM or sooner if needed   Diona Fanti, CNM [facility], [facility] 10/29/18 11:02 AM

## 2018-10-29 NOTE — Progress Notes (Signed)
Last depo inj: 08/12/18 UPT: N/A Side effects: N/A Next Depo- Provera injection due: 01/14/19-01/28/19 Annual exam due: 10/29/19

## 2018-10-29 NOTE — Patient Instructions (Signed)
WE WOULD LOVE TO HEAR FROM YOU!!!!   Thank you Stacy Pineda for visiting [facility].  Providing our patients with the best experience possible is really important to [place], and we hope that you felt that on your recent visit. The most valuable feedback we get comes from [place]!!    If you receive a survey please take a couple of minutes to let us know how we did.Thank you for continuing to trust [place] with your care.   [facility]   Medroxyprogesterone injection [Contraceptive] What is this medicine? MEDROXYPROGESTERONE (me DROX ee proe JES te rone) contraceptive injections prevent pregnancy. They provide effective birth control for 3 months. Depo-subQ Provera 104 is also used for treating pain related to endometriosis. This medicine may be used for other purposes; ask your health care provider or pharmacist if you have questions. COMMON BRAND NAME(S): Depo-Provera, Depo-subQ Provera 104 What should I tell my health care provider before I take this medicine? They need to know if you have any of these conditions: -frequently drink alcohol -asthma -blood vessel disease or a history of a blood clot in the lungs or legs -bone disease such as osteoporosis -breast cancer -diabetes -eating disorder (anorexia nervosa or bulimia) -high blood pressure -HIV infection or AIDS -kidney disease -liver disease -mental depression -migraine -seizures (convulsions) -stroke -tobacco smoker -vaginal bleeding -an unusual or allergic reaction to medroxyprogesterone, other hormones, medicines, foods, dyes, or preservatives -pregnant or trying to get pregnant -breast-feeding How should I use this medicine? Depo-Provera Contraceptive injection is given into a muscle. Depo-subQ Provera 104 injection is given under the skin. These injections are given by a health care professional. You must not be pregnant before getting an injection. The injection is usually given during  the first 5 days after the start of a menstrual period or 6 weeks after delivery of a baby. Talk to your pediatrician regarding the use of this medicine in children. Special care may be needed. These injections have been used in female children who have started having menstrual periods. Overdosage: If you think you have taken too much of this medicine contact a poison control center or emergency room at once. NOTE: This medicine is only for you. Do not share this medicine with others. What if I miss a dose? Try not to miss a dose. You must get an injection once every 3 months to maintain birth control. If you cannot keep an appointment, call and reschedule it. If you wait longer than 13 weeks between Depo-Provera contraceptive injections or longer than 14 weeks between Depo-subQ Provera 104 injections, you could get pregnant. Use another method for birth control if you miss your appointment. You may also need a pregnancy test before receiving another injection. What may interact with this medicine? Do not take this medicine with any of the following medications: -bosentan This medicine may also interact with the following medications: -aminoglutethimide -antibiotics or medicines for infections, especially rifampin, rifabutin, rifapentine, and griseofulvin -aprepitant -barbiturate medicines such as phenobarbital or primidone -bexarotene -carbamazepine -medicines for seizures like ethotoin, felbamate, oxcarbazepine, phenytoin, topiramate -modafinil -St. John's wort This list may not describe all possible interactions. Give your health care provider a list of all the medicines, herbs, non-prescription drugs, or dietary supplements you use. Also tell them if you smoke, drink alcohol, or use illegal drugs. Some items may interact with your medicine. What should I watch for while using this medicine? This drug does not protect you against HIV infection (AIDS) or other sexually  transmitted  diseases. Use of this product may cause you to lose calcium from your bones. Loss of calcium may cause weak bones (osteoporosis). Only use this product for more than 2 years if other forms of birth control are not right for you. The longer you use this product for birth control the more likely you will be at risk for weak bones. Ask your health care professional how you can keep strong bones. You may have a change in bleeding pattern or irregular periods. Many females stop having periods while taking this drug. If you have received your injections on time, your chance of being pregnant is very low. If you think you may be pregnant, see your health care professional as soon as possible. Tell your health care professional if you want to get pregnant within the next year. The effect of this medicine may last a long time after you get your last injection. What side effects may I notice from receiving this medicine? Side effects that you should report to your doctor or health care professional as soon as possible: -allergic reactions like skin rash, itching or hives, swelling of the face, lips, or tongue -breast tenderness or discharge -breathing problems -changes in vision -depression -feeling faint or lightheaded, falls -fever -pain in the abdomen, chest, groin, or leg -problems with balance, talking, walking -unusually weak or tired -yellowing of the eyes or skin Side effects that usually do not require medical attention (report to your doctor or health care professional if they continue or are bothersome): -acne -fluid retention and swelling -headache -irregular periods, spotting, or absent periods -temporary pain, itching, or skin reaction at site where injected -weight gain This list may not describe all possible side effects. Call your doctor for medical advice about side effects. You may report side effects to FDA at 1-800-FDA-1088. Where should I keep my medicine? This does not apply.  The injection will be given to you by a health care professional. NOTE: This sheet is a summary. It may not cover all possible information. If you have questions about this medicine, talk to your doctor, pharmacist, or health care provider.  2019 Elsevier/Gold Standard (2008-08-18 18:37:56)   Preventive Care 40-64 Years, Female Preventive care refers to lifestyle choices and visits with your health care provider that can promote health and wellness. What does preventive care include?   A yearly physical exam. This is also called an annual well check.  Dental exams once or twice a year.  Routine eye exams. Ask your health care provider how often you should have your eyes checked.  Personal lifestyle choices, including: ? Daily care of your teeth and gums. ? Regular physical activity. ? Eating a healthy diet. ? Avoiding tobacco and drug use. ? Limiting alcohol use. ? Practicing safe sex. ? Taking low-dose aspirin daily starting at age 22. ? Taking vitamin and mineral supplements as recommended by your health care provider. What happens during an annual well check? The services and screenings done by your health care provider during your annual well check will depend on your age, overall health, lifestyle risk factors, and family history of disease. Counseling Your health care provider may ask you questions about your:  Alcohol use.  Tobacco use.  Drug use.  Emotional well-being.  Home and relationship well-being.  Sexual activity.  Eating habits.  Work and work Statistician.  Method of birth control.  Menstrual cycle.  Pregnancy history. Screening You may have the following tests or measurements:  Height, weight, and BMI.  Blood pressure.  Lipid and cholesterol levels. These may be checked every 5 years, or more frequently if you are over 5 years old.  Skin check.  Lung cancer screening. You may have this screening every year starting at age 68 if you have  a 30-pack-year history of smoking and currently smoke or have quit within the past 15 years.  Colorectal cancer screening. All adults should have this screening starting at age 42 and continuing until age 57. Your health care provider may recommend screening at age 53. You will have tests every 1-10 years, depending on your results and the type of screening test. People at increased risk should start screening at an earlier age. Screening tests may include: ? Guaiac-based fecal occult blood testing. ? Fecal immunochemical test (FIT). ? Stool DNA test. ? Virtual colonoscopy. ? Sigmoidoscopy. During this test, a flexible tube with a tiny camera (sigmoidoscope) is used to examine your rectum and lower colon. The sigmoidoscope is inserted through your anus into your rectum and lower colon. ? Colonoscopy. During this test, a long, thin, flexible tube with a tiny camera (colonoscope) is used to examine your entire colon and rectum.  Hepatitis C blood test.  Hepatitis B blood test.  Sexually transmitted disease (STD) testing.  Diabetes screening. This is done by checking your blood sugar (glucose) after you have not eaten for a while (fasting). You may have this done every 1-3 years.  Mammogram. This may be done every 1-2 years. Talk to your health care provider about when you should start having regular mammograms. This may depend on whether you have a family history of breast cancer.  BRCA-related cancer screening. This may be done if you have a family history of breast, ovarian, tubal, or peritoneal cancers.  Pelvic exam and Pap test. This may be done every 3 years starting at age 14. Starting at age 49, this may be done every 5 years if you have a Pap test in combination with an HPV test.  Bone density scan. This is done to screen for osteoporosis. You may have this scan if you are at high risk for osteoporosis. Discuss your test results, treatment options, and if necessary, the need for more  tests with your health care provider. Vaccines Your health care provider may recommend certain vaccines, such as:  Influenza vaccine. This is recommended every year.  Tetanus, diphtheria, and acellular pertussis (Tdap, Td) vaccine. You may need a Td booster every 10 years.  Varicella vaccine. You may need this if you have not been vaccinated.  Zoster vaccine. You may need this after age 4.  Measles, mumps, and rubella (MMR) vaccine. You may need at least one dose of MMR if you were born in 1957 or later. You may also need a second dose.  Pneumococcal 13-valent conjugate (PCV13) vaccine. You may need this if you have certain conditions and were not previously vaccinated.  Pneumococcal polysaccharide (PPSV23) vaccine. You may need one or two doses if you smoke cigarettes or if you have certain conditions.  Meningococcal vaccine. You may need this if you have certain conditions.  Hepatitis A vaccine. You may need this if you have certain conditions or if you travel or work in places where you may be exposed to hepatitis A.  Hepatitis B vaccine. You may need this if you have certain conditions or if you travel or work in places where you may be exposed to hepatitis B.  Haemophilus influenzae type b (Hib) vaccine. You may need this  if you have certain conditions. Talk to your health care provider about which screenings and vaccines you need and how often you need them. This information is not intended to replace advice given to you by your health care provider. Make sure you discuss any questions you have with your health care provider. Document Released: 08/24/2015 Document Revised: 09/17/2017 Document Reviewed: 05/29/2015 Elsevier Interactive Patient Education  2019 [employer].

## 2018-11-01 DIAGNOSIS — F332 Major depressive disorder, recurrent severe without psychotic features: Secondary | ICD-10-CM | POA: Diagnosis not present

## 2018-11-01 DIAGNOSIS — F4312 Post-traumatic stress disorder, chronic: Secondary | ICD-10-CM | POA: Diagnosis not present

## 2018-11-01 DIAGNOSIS — F410 Panic disorder [episodic paroxysmal anxiety] without agoraphobia: Secondary | ICD-10-CM | POA: Diagnosis not present

## 2018-11-16 ENCOUNTER — Other Ambulatory Visit: Payer: Self-pay

## 2018-11-16 ENCOUNTER — Ambulatory Visit (INDEPENDENT_AMBULATORY_CARE_PROVIDER_SITE_OTHER): Payer: Medicare Other | Admitting: Adult Health

## 2018-11-16 ENCOUNTER — Encounter: Payer: Self-pay | Admitting: Adult Health

## 2018-11-16 VITALS — Resp 16 | Ht 73.0 in | Wt 172.0 lb

## 2018-11-16 DIAGNOSIS — I10 Essential (primary) hypertension: Secondary | ICD-10-CM | POA: Diagnosis not present

## 2018-11-16 DIAGNOSIS — F411 Generalized anxiety disorder: Secondary | ICD-10-CM

## 2018-11-16 DIAGNOSIS — G8929 Other chronic pain: Secondary | ICD-10-CM

## 2018-11-16 DIAGNOSIS — R109 Unspecified abdominal pain: Secondary | ICD-10-CM

## 2018-11-16 MED ORDER — FENTANYL 12 MCG/HR TD PT72: 1.0000 | MEDICATED_PATCH | TRANSDERMAL | 0 refills | Status: DC

## 2018-11-16 NOTE — Progress Notes (Signed)
[facility] [place], [place] [address]  Internal MEDICINE  Telephone Visit  Patient Name: Stacy Pineda  [id number]  [id number]  Date of Service: 11/16/2018  I connected with the patient at 1028 by video and verified the patients identity using two identifiers.  I discussed the limitations, risks, security and privacy concerns of performing an evaluation and management service by telephone and the availability of in person appointments. I also discussed with the patient that there may be a patient responsible charge related to the service.  The patient expressed understanding and agrees to proceed.    Chief Complaint  Patient presents with  . Telephone Screen    still having abdominal pain , real frustrated with it   . Telephone Assessment    HPI Pt reports she continues to have abdominal pain.  She is seeing GI currently, and is awaiting a CBO Test.  She is suppose to be receiving information via mail, that she will have to sign and return but she has not received any correspondence at this time. She is in need of a refill on her fentanyl patches at this time. Overall she is doing well, and denies any other needs.    Current Medication: Outpatient Encounter Medications as of 11/16/2018  Medication Sig Note  . albuterol (VENTOLIN HFA) 108 (90 Base) MCG/ACT inhaler  06/28/2015: Received from: [facility]  . ASMANEX HFA 100 MCG/ACT AERO  06/28/2015: Received from: External Pharmacy  . Butalbital-APAP-Caff-Cod 50-300-40-30 MG CAPS TAKE ONE TAB PO QD PRN FOR PAIN   . clonazePAM (KLONOPIN) 1 MG tablet Take 1 mg by mouth 2 (two) times daily.   . fentaNYL (DURAGESIC) 12 MCG/HR Place 1 patch onto the skin every 3 (three) days.   . medroxyPROGESTERone (DEPO-PROVERA) 150 MG/ML injection Inject 1 mL (150 mg total) into the muscle every 3 (three) months.   . mirtazapine (REMERON) 45 MG tablet Take 1 tablet (45 mg total) by mouth at bedtime.   . Multiple  Vitamins-Minerals (MULTIVITAMIN ADULT PO) Take 1 tablet by mouth.   . prazosin (MINIPRESS) 2 MG capsule Take 2 mg by mouth at bedtime.   . pregabalin (LYRICA) 50 MG capsule Take 1 capsule (50 mg total) by mouth 3 (three) times daily.   . promethazine (PHENERGAN) 6.25 MG/5ML syrup TAKE  10 ML BY MOUTH EVERY 8 HOURS AS NEEDED FOR NAUSEA AND VOMITING OR  REFRACTORY  NAUSEA/VOMITING   . venlafaxine XR (EFFEXOR-XR) 150 MG 24 hr capsule TAKE 2 CAPSULES BY MOUTH IN THE MORNING   . [DISCONTINUED] fentaNYL (DURAGESIC) 12 MCG/HR Place 1 patch onto the skin every 3 (three) days.   Marland Kitchen Ubrogepant (UBRELVY) 50 MG TABS Take 50 mg by mouth 2 (two) times daily as needed. (Patient not taking: Reported on 10/29/2018)    No facility-administered encounter medications on file as of 11/16/2018.     Surgical History: Past Surgical History:  Procedure Laterality Date  . ABDOMINAL SURGERY    . CHOLECYSTECTOMY    . COLONOSCOPY WITH PROPOFOL N/A 09/08/2018   Procedure: COLONOSCOPY WITH PROPOFOL;  Surgeon: Virgel Manifold, MD;  Location: [facility] ENDOSCOPY;  Service: Endoscopy;  Laterality: N/A;  . DECUBITUS ULCER EXCISION    . DILATION AND CURETTAGE OF UTERUS    . ESOPHAGOGASTRODUODENOSCOPY (EGD) WITH PROPOFOL N/A 09/08/2018   Procedure: ESOPHAGOGASTRODUODENOSCOPY (EGD) WITH PROPOFOL;  Surgeon: Virgel Manifold, MD;  Location: [facility] ENDOSCOPY;  Service: Endoscopy;  Laterality: N/A;  . GASTRIC BYPASS    . GASTRIC  BYPASS    . REVISION GASTRIC RESTRICTIVE PROCEDURE FOR MORBID OBESITY      Medical History: Past Medical History:  Diagnosis Date  . Anxiety   . Asthma   . Chronic headaches   . Depression   . Pancreatitis   . Pancreatitis   . PTSD (post-traumatic stress disorder)     Family History: Family History  Problem Relation Age of Onset  . Graves' disease Mother   . Hypertension Mother   . Hyperlipidemia Mother   . Anxiety disorder Father   . Prostate cancer Father   . Depression Brother   .  Anxiety disorder Brother   . Anxiety disorder Maternal Aunt   . Depression Maternal Aunt   . Anxiety disorder Paternal Aunt   . Depression Paternal Aunt   . Anxiety disorder Maternal Uncle   . Depression Maternal Uncle   . Anxiety disorder Paternal Uncle   . Depression Paternal Uncle   . Anxiety disorder Maternal Grandfather   . Depression Maternal Grandfather   . Diabetes Maternal Grandfather   . Anxiety disorder Maternal Grandmother   . Depression Maternal Grandmother   . Breast cancer Maternal Grandmother   . Ovarian cancer Maternal Grandmother   . Diabetes Maternal Grandmother   . Colon cancer Neg Hx     Social History   Socioeconomic History  . Marital status: Single    Spouse name: Not on file  . Number of children: Not on file  . Years of education: Not on file  . Highest education level: Not on file  Occupational History  . Not on file  Social Needs  . Financial resource strain: Not on file  . Food insecurity:    Worry: Not on file    Inability: Not on file  . Transportation needs:    Medical: Not on file    Non-medical: Not on file  Tobacco Use  . Smoking status: Current Some Day Smoker    Packs/day: 0.25    Types: Cigars, E-cigarettes, Cigarettes    Start date: 03/26/2005  . Smokeless tobacco: Never Used  . Tobacco comment: pt smoke weekly  Substance and Sexual Activity  . Alcohol use: No    Alcohol/week: 0.0 standard drinks  . Drug use: Yes    Frequency: 3.0 times per week    Types: Marijuana    Comment: MJ use weekly  . Sexual activity: Not Currently    Birth control/protection: Injection  Lifestyle  . Physical activity:    Days per week: Not on file    Minutes per session: Not on file  . Stress: Not on file  Relationships  . Social connections:    Talks on phone: Not on file    Gets together: Not on file    Attends religious service: Not on file    Active member of club or organization: Not on file    Attends meetings of clubs or  organizations: Not on file    Relationship status: Not on file  . Intimate partner violence:    Fear of current or ex partner: Not on file    Emotionally abused: Not on file    Physically abused: Not on file    Forced sexual activity: Not on file  Other Topics Concern  . Not on file  Social History Narrative  . Not on file      Review of Systems  Constitutional: Negative for chills, fatigue and unexpected weight change.  HENT: Negative for congestion, rhinorrhea, sneezing and sore  throat.   Eyes: Negative for photophobia, pain and redness.  Respiratory: Negative for cough, chest tightness and shortness of breath.   Cardiovascular: Negative for chest pain and palpitations.  Gastrointestinal: Positive for abdominal pain. Negative for constipation, diarrhea, nausea and vomiting.  Endocrine: Negative.   Genitourinary: Negative for dysuria and frequency.  Musculoskeletal: Negative for arthralgias, back pain, joint swelling and neck pain.  Skin: Negative for rash.  Allergic/Immunologic: Negative.   Neurological: Negative for tremors and numbness.  Hematological: Negative for adenopathy. Does not bruise/bleed easily.  Psychiatric/Behavioral: Negative for behavioral problems and sleep disturbance. The patient is not nervous/anxious.     Vital Signs: Resp 16   Ht 6\' 1"  (1.854 m)   Wt 172 lb (78 kg)   BMI 22.69 kg/m    Observation/Objective:  Appears well.  communicting without difficulty.  Continues to report abdominal pain even with Fentanyl patches.  Assessment/Plan: 1. Chronic abdominal pain Reviewed risks and possible side effects associated with taking opiates, benzodiazepines and other CNS depressants. Combination of these could cause dizziness and drowsiness. Advised patient not to drive or operate machinery when taking these medications, as patient's and other's life can be at risk and will have consequences. Patient verbalized understanding in this matter. Dependence and  abuse for these drugs will be monitored closely. A Controlled substance policy and procedure is on file which allows [facility] to order a urine drug screen test at any visit. Patient understands and agrees with the plan - fentaNYL (DURAGESIC) 12 MCG/HR; Place 1 patch onto the skin every 3 (three) days.  Dispense: 10 patch; Refill: 0 Pt will call and follow up with GI office about CBO test.   2. Essential (primary) hypertension Stable, continue present management.   3. GAD (generalized anxiety disorder) Stable, continue to monitor.   General Counseling: aaleah hirsch understanding of the findings of today's phone visit and agrees with plan of treatment. I have discussed any further diagnostic evaluation that may be needed or ordered today. We also reviewed her medications today. she has been encouraged to call the office with any questions or concerns that should arise related to todays visit.    No orders of the defined types were placed in this encounter.   Meds ordered this encounter  Medications  . fentaNYL (DURAGESIC) 12 MCG/HR    Sig: Place 1 patch onto the skin every 3 (three) days.    Dispense:  10 patch    Refill:  0    Time spent:12 Minutes    Orson Gear AGNP-C Internal medicine

## 2018-12-16 ENCOUNTER — Other Ambulatory Visit: Payer: Self-pay

## 2018-12-16 ENCOUNTER — Encounter: Payer: Self-pay | Admitting: Nurse Practitioner

## 2018-12-16 ENCOUNTER — Ambulatory Visit (INDEPENDENT_AMBULATORY_CARE_PROVIDER_SITE_OTHER): Payer: Medicare Other | Admitting: Nurse Practitioner

## 2018-12-16 DIAGNOSIS — J4520 Mild intermittent asthma, uncomplicated: Secondary | ICD-10-CM

## 2018-12-16 DIAGNOSIS — G8929 Other chronic pain: Secondary | ICD-10-CM | POA: Diagnosis not present

## 2018-12-16 DIAGNOSIS — J0141 Acute recurrent pansinusitis: Secondary | ICD-10-CM | POA: Diagnosis not present

## 2018-12-16 DIAGNOSIS — R109 Unspecified abdominal pain: Secondary | ICD-10-CM

## 2018-12-16 MED ORDER — FENTANYL 12 MCG/HR TD PT72: 1.0000 | MEDICATED_PATCH | TRANSDERMAL | 0 refills | Status: DC

## 2018-12-16 MED ORDER — CEFUROXIME AXETIL 500 MG PO TABS: 500.0000 mg | ORAL_TABLET | Freq: Two times a day (BID) | ORAL | 0 refills | Status: DC

## 2018-12-16 MED ORDER — ALBUTEROL SULFATE HFA 108 (90 BASE) MCG/ACT IN AERS: 2.0000 | INHALATION_SPRAY | Freq: Four times a day (QID) | RESPIRATORY_TRACT | 3 refills | Status: DC | PRN

## 2018-12-16 NOTE — Progress Notes (Signed)
[facility] [place], [place] [address]  Internal MEDICINE  Telephone Visit  Patient Name: Stacy Pineda  [id number]  [id number]  Date of Service: 01/03/2019  I connected with the patient at 4:12pm by telephone and verified the patients identity using two identifiers.   I discussed the limitations, risks, security and privacy concerns of performing an evaluation and management service by telephone and the availability of in person appointments. I also discussed with the patient that there may be a patient responsible charge related to the service.  The patient expressed understanding and agrees to proceed.    Chief Complaint  Patient presents with  . Telephone Assessment  . Tachycardia  . Medication Refill    fentanyl patch    The patient has been contacted via telephone for follow up visit due to concerns for spread of novel coronavirus.  Today, she is complaining of congestion, wheezing, cough, and congestion. Has been going on for several days. Symptoms are persistent despite using OTC medications.  She states that she also continues to have abdominal pain. Was started on Fentanyl 12.5mg  patch at her most recent visit. States that abdominal pain is still present, however, not debilitating at all times. States that the first two days she generally feels pretty good, however, by the third day, the pain rapidly increases back to about 7/10 in severity. She states that the fentanyl patch has helped improve her chronic diarrhea.       Current Medication: Outpatient Encounter Medications as of 12/16/2018  Medication Sig Note  . albuterol (VENTOLIN HFA) 108 (90 Base) MCG/ACT inhaler Inhale 2 puffs into the lungs every 6 (six) hours as needed for wheezing or shortness of breath.   Darlin Priestly HFA 100 MCG/ACT AERO  06/28/2015: Received from: External Pharmacy  . Butalbital-APAP-Caff-Cod 50-300-40-30 MG CAPS TAKE ONE TAB PO QD PRN FOR PAIN   . clonazePAM (KLONOPIN) 1 MG  tablet Take 1 mg by mouth 2 (two) times daily.   . fentaNYL (DURAGESIC) 12 MCG/HR Place 1 patch onto the skin every 3 (three) days.   . medroxyPROGESTERone (DEPO-PROVERA) 150 MG/ML injection Inject 1 mL (150 mg total) into the muscle every 3 (three) months.   . mirtazapine (REMERON) 45 MG tablet Take 1 tablet (45 mg total) by mouth at bedtime.   . Multiple Vitamins-Minerals (MULTIVITAMIN ADULT PO) Take 1 tablet by mouth.   . prazosin (MINIPRESS) 2 MG capsule Take 2 mg by mouth at bedtime.   . pregabalin (LYRICA) 50 MG capsule Take 1 capsule (50 mg total) by mouth 3 (three) times daily.   . promethazine (PHENERGAN) 6.25 MG/5ML syrup TAKE  10 ML BY MOUTH EVERY 8 HOURS AS NEEDED FOR NAUSEA AND VOMITING OR  REFRACTORY  NAUSEA/VOMITING   . Ubrogepant (UBRELVY) 50 MG TABS Take 50 mg by mouth 2 (two) times daily as needed.   . venlafaxine XR (EFFEXOR-XR) 150 MG 24 hr capsule TAKE 2 CAPSULES BY MOUTH IN THE MORNING   . [DISCONTINUED] albuterol (VENTOLIN HFA) 108 (90 Base) MCG/ACT inhaler  06/28/2015: Received from: [facility]  . [DISCONTINUED] fentaNYL (DURAGESIC) 12 MCG/HR Place 1 patch onto the skin every 3 (three) days.   . cefUROXime (CEFTIN) 500 MG tablet Take 1 tablet (500 mg total) by mouth 2 (two) times daily with a meal.    No facility-administered encounter medications on file as of 12/16/2018.     Surgical History: Past Surgical History:  Procedure Laterality Date  . ABDOMINAL SURGERY    .  CHOLECYSTECTOMY    . COLONOSCOPY WITH PROPOFOL N/A 09/08/2018   Procedure: COLONOSCOPY WITH PROPOFOL;  Surgeon: Virgel Manifold, MD;  Location: [facility] ENDOSCOPY;  Service: Endoscopy;  Laterality: N/A;  . DECUBITUS ULCER EXCISION    . DILATION AND CURETTAGE OF UTERUS    . ESOPHAGOGASTRODUODENOSCOPY (EGD) WITH PROPOFOL N/A 09/08/2018   Procedure: ESOPHAGOGASTRODUODENOSCOPY (EGD) WITH PROPOFOL;  Surgeon: Virgel Manifold, MD;  Location: [facility] ENDOSCOPY;  Service: Endoscopy;  Laterality: N/A;  .  GASTRIC BYPASS    . GASTRIC BYPASS    . REVISION GASTRIC RESTRICTIVE PROCEDURE FOR MORBID OBESITY      Medical History: Past Medical History:  Diagnosis Date  . Anxiety   . Asthma   . Chronic headaches   . Depression   . Pancreatitis   . Pancreatitis   . PTSD (post-traumatic stress disorder)     Family History: Family History  Problem Relation Age of Onset  . Graves' disease Mother   . Hypertension Mother   . Hyperlipidemia Mother   . Anxiety disorder Father   . Prostate cancer Father   . Depression Brother   . Anxiety disorder Brother   . Anxiety disorder Maternal Aunt   . Depression Maternal Aunt   . Anxiety disorder Paternal Aunt   . Depression Paternal Aunt   . Anxiety disorder Maternal Uncle   . Depression Maternal Uncle   . Anxiety disorder Paternal Uncle   . Depression Paternal Uncle   . Anxiety disorder Maternal Grandfather   . Depression Maternal Grandfather   . Diabetes Maternal Grandfather   . Anxiety disorder Maternal Grandmother   . Depression Maternal Grandmother   . Breast cancer Maternal Grandmother   . Ovarian cancer Maternal Grandmother   . Diabetes Maternal Grandmother   . Colon cancer Neg Hx     Social History   Socioeconomic History  . Marital status: Single    Spouse name: Not on file  . Number of children: Not on file  . Years of education: Not on file  . Highest education level: Not on file  Occupational History  . Not on file  Social Needs  . Financial resource strain: Not on file  . Food insecurity:    Worry: Not on file    Inability: Not on file  . Transportation needs:    Medical: Not on file    Non-medical: Not on file  Tobacco Use  . Smoking status: Current Some Day Smoker    Packs/day: 0.25    Types: Cigars, E-cigarettes, Cigarettes    Start date: 03/26/2005  . Smokeless tobacco: Never Used  . Tobacco comment: pt smoke weekly  Substance and Sexual Activity  . Alcohol use: No    Alcohol/week: 0.0 standard drinks  .  Drug use: Yes    Frequency: 3.0 times per week    Types: Marijuana    Comment: MJ use weekly  . Sexual activity: Not Currently    Birth control/protection: Injection  Lifestyle  . Physical activity:    Days per week: Not on file    Minutes per session: Not on file  . Stress: Not on file  Relationships  . Social connections:    Talks on phone: Not on file    Gets together: Not on file    Attends religious service: Not on file    Active member of club or organization: Not on file    Attends meetings of clubs or organizations: Not on file    Relationship status: Not on  file  . Intimate partner violence:    Fear of current or ex partner: Not on file    Emotionally abused: Not on file    Physically abused: Not on file    Forced sexual activity: Not on file  Other Topics Concern  . Not on file  Social History Narrative  . Not on file      Review of Systems  Constitutional: Negative for chills, fatigue and unexpected weight change.  HENT: Positive for congestion, postnasal drip, rhinorrhea, sinus pressure and sinus pain. Negative for sneezing and sore throat.   Respiratory: Positive for cough and wheezing. Negative for chest tightness and shortness of breath.   Cardiovascular: Negative for chest pain and palpitations.  Gastrointestinal: Positive for abdominal pain. Negative for constipation, diarrhea, nausea and vomiting.       Improved diarrhea  Musculoskeletal: Negative for arthralgias, back pain, joint swelling and neck pain.  Skin: Negative for rash.  Allergic/Immunologic: Positive for environmental allergies.  Neurological: Negative for tremors and numbness.  Hematological: Negative for adenopathy. Does not bruise/bleed easily.  Psychiatric/Behavioral: Negative for behavioral problems and sleep disturbance. The patient is not nervous/anxious.     Vital Signs: There were no vitals taken for this visit.   Observation/Objective:   The patient is alert and oriented.  She is pleasant and answers all questions appropriately. Breathing is non-labored. She is in no acute distress at this time. She sounds congested. Mild wheezing with congested cough are appreciated today.    Assessment/Plan: 1. Acute recurrent pansinusitis Start ceftin 500mg  twice daily for 10 days. Rest and increase fluids. Should continue to take OTC medication to improve symptoms.  - cefUROXime (CEFTIN) 500 MG tablet; Take 1 tablet (500 mg total) by mouth 2 (two) times daily with a meal.  Dispense: 20 tablet; Refill: 0  2. Mild intermittent asthma without complication Ventolin inhaler may be used every six hours as needed for cough and wheezing.  - albuterol (VENTOLIN HFA) 108 (90 Base) MCG/ACT inhaler; Inhale 2 puffs into the lungs every 6 (six) hours as needed for wheezing or shortness of breath.  Dispense: 1 Inhaler; Refill: 3  3. Chronic abdominal pain Will continue fentanyl patch at 28mcg/hour. Change every 3 days. Will follow up in one week to reassess and adjust dosing as indicated.  - fentaNYL (DURAGESIC) 12 MCG/HR; Place 1 patch onto the skin every 3 (three) days.  Dispense: 10 patch; Refill: 0  General Counseling: Charlii verbalizes understanding of the findings of today's phone visit and agrees with plan of treatment. I have discussed any further diagnostic evaluation that may be needed or ordered today. We also reviewed her medications today. she has been encouraged to call the office with any questions or concerns that should arise related to todays visit.  Reviewed risks and possible side effects associated with taking opiates, benzodiazepines and other CNS depressants. Combination of these could cause dizziness and drowsiness. Advised patient not to drive or operate machinery when taking these medications, as patient's and other's life can be at risk and will have consequences. Patient verbalized understanding in this matter. Dependence and abuse for these drugs will be monitored  closely. A Controlled substance policy and procedure is on file which allows [facility] to order a urine drug screen test at any visit. Patient understands and agrees with the plan  This patient was seen by Leretha Pol FNP Collaboration with Dr Lavera Guise as a part of collaborative care agreement  Meds ordered this encounter  Medications  . albuterol (VENTOLIN HFA) 108 (90 Base) MCG/ACT inhaler    Sig: Inhale 2 puffs into the lungs every 6 (six) hours as needed for wheezing or shortness of breath.    Dispense:  1 Inhaler    Refill:  3    Order Specific Question:   Supervising Provider    Answer:   Lavera Guise [[id number]]  . cefUROXime (CEFTIN) 500 MG tablet    Sig: Take 1 tablet (500 mg total) by mouth 2 (two) times daily with a meal.    Dispense:  20 tablet    Refill:  0    Order Specific Question:   Supervising Provider    Answer:   Lavera Guise [place]  . fentaNYL (DURAGESIC) 12 MCG/HR    Sig: Place 1 patch onto the skin every 3 (three) days.    Dispense:  10 patch    Refill:  0    Order Specific Question:   Supervising Provider    Answer:   Lavera Guise [[id number]]    Time spent: 25 Minutes - spent 15 minutes reviewing the chart with the patient.     Dr Lavera Guise Internal medicine

## 2019-01-03 DIAGNOSIS — J0141 Acute recurrent pansinusitis: Secondary | ICD-10-CM | POA: Insufficient documentation

## 2019-01-13 DIAGNOSIS — F410 Panic disorder [episodic paroxysmal anxiety] without agoraphobia: Secondary | ICD-10-CM | POA: Diagnosis not present

## 2019-01-13 DIAGNOSIS — F332 Major depressive disorder, recurrent severe without psychotic features: Secondary | ICD-10-CM | POA: Diagnosis not present

## 2019-01-13 DIAGNOSIS — F4312 Post-traumatic stress disorder, chronic: Secondary | ICD-10-CM | POA: Diagnosis not present

## 2019-01-19 ENCOUNTER — Other Ambulatory Visit: Payer: Self-pay

## 2019-01-19 MED ORDER — MEDROXYPROGESTERONE ACETATE 150 MG/ML IM SUSP: 150.0000 mg | INTRAMUSCULAR | 2 refills | Status: DC

## 2019-01-20 ENCOUNTER — Ambulatory Visit: Payer: Self-pay

## 2019-01-20 ENCOUNTER — Other Ambulatory Visit: Payer: Self-pay

## 2019-01-24 NOTE — Progress Notes (Deleted)
Pt no showed for appt..... 

## 2019-01-25 ENCOUNTER — Other Ambulatory Visit: Payer: Medicare Other

## 2019-01-25 ENCOUNTER — Ambulatory Visit: Payer: Medicare Other

## 2019-01-26 ENCOUNTER — Other Ambulatory Visit: Payer: Self-pay

## 2019-01-26 ENCOUNTER — Encounter: Payer: Self-pay | Admitting: Nurse Practitioner

## 2019-01-26 ENCOUNTER — Ambulatory Visit: Payer: Medicare Other | Admitting: Nurse Practitioner

## 2019-01-26 VITALS — Temp 98.7°F | Ht 73.0 in | Wt 157.0 lb

## 2019-01-26 DIAGNOSIS — G8929 Other chronic pain: Secondary | ICD-10-CM | POA: Diagnosis not present

## 2019-01-26 DIAGNOSIS — R109 Unspecified abdominal pain: Secondary | ICD-10-CM | POA: Diagnosis not present

## 2019-01-26 DIAGNOSIS — G43909 Migraine, unspecified, not intractable, without status migrainosus: Secondary | ICD-10-CM

## 2019-01-26 DIAGNOSIS — R112 Nausea with vomiting, unspecified: Secondary | ICD-10-CM | POA: Diagnosis not present

## 2019-01-26 DIAGNOSIS — K529 Noninfective gastroenteritis and colitis, unspecified: Secondary | ICD-10-CM | POA: Diagnosis not present

## 2019-01-26 MED ORDER — PREGABALIN 50 MG PO CAPS: 50.0000 mg | ORAL_CAPSULE | Freq: Three times a day (TID) | ORAL | 3 refills | Status: DC

## 2019-01-26 MED ORDER — CIPROFLOXACIN HCL 500 MG PO TABS: 500.0000 mg | ORAL_TABLET | Freq: Two times a day (BID) | ORAL | 0 refills | Status: DC

## 2019-01-26 MED ORDER — ONDANSETRON 8 MG PO TBDP: 8.0000 mg | ORAL_TABLET | Freq: Three times a day (TID) | ORAL | 1 refills | Status: DC | PRN

## 2019-01-26 MED ORDER — BUTALBITAL-APAP-CAFFEINE 50-325-40 MG PO TABS: 1.0000 | ORAL_TABLET | Freq: Four times a day (QID) | ORAL | 1 refills | Status: DC | PRN

## 2019-01-26 MED ORDER — FENTANYL 12 MCG/HR TD PT72: 1.0000 | MEDICATED_PATCH | TRANSDERMAL | 0 refills | Status: DC

## 2019-01-26 NOTE — Progress Notes (Signed)
[facility] [place], [place] [address]  Internal MEDICINE  Telephone Visit  Patient Name: Stacy Pineda  [id number]  [id number]  Date of Service: 01/26/2019  I connected with the patient at 10:22am by webcam and verified the patients identity using two identifiers.   I discussed the limitations, risks, security and privacy concerns of performing an evaluation and management service by webcam and the availability of in person appointments. I also discussed with the patient that there may be a patient responsible charge related to the service.  The patient expressed understanding and agrees to proceed.    Chief Complaint  Patient presents with  . Telephone Screen    VIDEO VISIT [phone]  . Telephone Assessment  . Fever    fever on and off, loss of appetite, gastro issues, pt has not been around anyone sick that she know of, pt have not been traveliing much except [facility] for rx's she makes sure that she wears mask when outside  . Vomiting  . Diarrhea  . Chills    cold chills from time to time, take temp when feeling chills its around 99.7  . Fatigue    feels like weights on her feet, very fatigued, all symptoms have been going on for about 5-8 days    The patient has been contacted via webcam for follow up visit due to concerns for spread of novel coronavirus.  The patient states that she has been having severe nausea with vomiting and fever for the past 5 days. She states that nausea has become so severe that she is unable to keep even fluids down. She also has bad migraine headache. She denies sore throat or nasal congestion. States that she has not ben exposed to [place] to her knowledge. States that she has been self quarantining due to fear of spread of COVID 19. She knows that she is at higher risk for developing complications if she contracts the virus. She states that she also needs to have refills for her fentanyl patch, lyrica, and medication to  alleviate headache.       Current Medication: Outpatient Encounter Medications as of 01/26/2019  Medication Sig Note  . albuterol (VENTOLIN HFA) 108 (90 Base) MCG/ACT inhaler Inhale 2 puffs into the lungs every 6 (six) hours as needed for wheezing or shortness of breath.   Darlin Priestly HFA 100 MCG/ACT AERO  06/28/2015: Received from: External Pharmacy  . clonazePAM (KLONOPIN) 1 MG tablet Take 1 mg by mouth 2 (two) times daily.   . fentaNYL (DURAGESIC) 12 MCG/HR Place 1 patch onto the skin every 3 (three) days.   . medroxyPROGESTERone (DEPO-PROVERA) 150 MG/ML injection Inject 1 mL (150 mg total) into the muscle every 3 (three) months.   . mirtazapine (REMERON) 45 MG tablet Take 1 tablet (45 mg total) by mouth at bedtime.   . Multiple Vitamins-Minerals (MULTIVITAMIN ADULT PO) Take 1 tablet by mouth.   . prazosin (MINIPRESS) 2 MG capsule Take 2 mg by mouth at bedtime.   . pregabalin (LYRICA) 50 MG capsule Take 1 capsule (50 mg total) by mouth 3 (three) times daily.   . promethazine (PHENERGAN) 6.25 MG/5ML syrup TAKE  10 ML BY MOUTH EVERY 8 HOURS AS NEEDED FOR NAUSEA AND VOMITING OR  REFRACTORY  NAUSEA/VOMITING   . Ubrogepant (UBRELVY) 50 MG TABS Take 50 mg by mouth 2 (two) times daily as needed.   . venlafaxine XR (EFFEXOR-XR) 150 MG 24 hr capsule TAKE 2 CAPSULES BY MOUTH  IN THE MORNING   . [DISCONTINUED] Butalbital-APAP-Caff-Cod 50-300-40-30 MG CAPS TAKE ONE TAB PO QD PRN FOR PAIN   . [DISCONTINUED] cefUROXime (CEFTIN) 500 MG tablet Take 1 tablet (500 mg total) by mouth 2 (two) times daily with a meal.   . [DISCONTINUED] fentaNYL (DURAGESIC) 12 MCG/HR Place 1 patch onto the skin every 3 (three) days.   . [DISCONTINUED] pregabalin (LYRICA) 50 MG capsule Take 1 capsule (50 mg total) by mouth 3 (three) times daily.   . butalbital-acetaminophen-caffeine (FIORICET) 50-325-40 MG tablet Take 1-2 tablets by mouth every 6 (six) hours as needed for headache.   . ciprofloxacin (CIPRO) 500 MG tablet Take 1  tablet (500 mg total) by mouth 2 (two) times daily.   . ondansetron (ZOFRAN-ODT) 8 MG disintegrating tablet Take 1 tablet (8 mg total) by mouth every 8 (eight) hours as needed for nausea or vomiting.    No facility-administered encounter medications on file as of 01/26/2019.     Surgical History: Past Surgical History:  Procedure Laterality Date  . ABDOMINAL SURGERY    . CHOLECYSTECTOMY    . COLONOSCOPY WITH PROPOFOL N/A 09/08/2018   Procedure: COLONOSCOPY WITH PROPOFOL;  Surgeon: Virgel Manifold, MD;  Location: [facility] ENDOSCOPY;  Service: Endoscopy;  Laterality: N/A;  . DECUBITUS ULCER EXCISION    . DILATION AND CURETTAGE OF UTERUS    . ESOPHAGOGASTRODUODENOSCOPY (EGD) WITH PROPOFOL N/A 09/08/2018   Procedure: ESOPHAGOGASTRODUODENOSCOPY (EGD) WITH PROPOFOL;  Surgeon: Virgel Manifold, MD;  Location: [facility] ENDOSCOPY;  Service: Endoscopy;  Laterality: N/A;  . GASTRIC BYPASS    . GASTRIC BYPASS    . REVISION GASTRIC RESTRICTIVE PROCEDURE FOR MORBID OBESITY      Medical History: Past Medical History:  Diagnosis Date  . Anxiety   . Asthma   . Chronic headaches   . Depression   . Pancreatitis   . Pancreatitis   . PTSD (post-traumatic stress disorder)     Family History: Family History  Problem Relation Age of Onset  . Graves' disease Mother   . Hypertension Mother   . Hyperlipidemia Mother   . Anxiety disorder Father   . Prostate cancer Father   . Depression Brother   . Anxiety disorder Brother   . Anxiety disorder Maternal Aunt   . Depression Maternal Aunt   . Anxiety disorder Paternal Aunt   . Depression Paternal Aunt   . Anxiety disorder Maternal Uncle   . Depression Maternal Uncle   . Anxiety disorder Paternal Uncle   . Depression Paternal Uncle   . Anxiety disorder Maternal Grandfather   . Depression Maternal Grandfather   . Diabetes Maternal Grandfather   . Anxiety disorder Maternal Grandmother   . Depression Maternal Grandmother   . Breast cancer Maternal  Grandmother   . Ovarian cancer Maternal Grandmother   . Diabetes Maternal Grandmother   . Colon cancer Neg Hx     Social History   Socioeconomic History  . Marital status: Single    Spouse name: Not on file  . Number of children: Not on file  . Years of education: Not on file  . Highest education level: Not on file  Occupational History  . Not on file  Social Needs  . Financial resource strain: Not on file  . Food insecurity    Worry: Not on file    Inability: Not on file  . Transportation needs    Medical: Not on file    Non-medical: Not on file  Tobacco Use  . Smoking  status: Current Some Day Smoker    Packs/day: 0.25    Types: Cigars, E-cigarettes, Cigarettes    Start date: 03/26/2005  . Smokeless tobacco: Never Used  . Tobacco comment: pt smoke weekly  Substance and Sexual Activity  . Alcohol use: No    Alcohol/week: 0.0 standard drinks  . Drug use: Yes    Frequency: 3.0 times per week    Types: Marijuana    Comment: MJ use weekly  . Sexual activity: Not Currently    Birth control/protection: Injection  Lifestyle  . Physical activity    Days per week: Not on file    Minutes per session: Not on file  . Stress: Not on file  Relationships  . Social Herbalist on phone: Not on file    Gets together: Not on file    Attends religious service: Not on file    Active member of club or organization: Not on file    Attends meetings of clubs or organizations: Not on file    Relationship status: Not on file  . Intimate partner violence    Fear of current or ex partner: Not on file    Emotionally abused: Not on file    Physically abused: Not on file    Forced sexual activity: Not on file  Other Topics Concern  . Not on file  Social History Narrative  . Not on file      Review of Systems  Constitutional: Positive for activity change, chills, fatigue and fever. Negative for unexpected weight change.  HENT: Negative for congestion, postnasal drip,  rhinorrhea, sinus pressure, sinus pain, sneezing and sore throat.   Respiratory: Negative for cough, chest tightness, shortness of breath and wheezing.   Cardiovascular: Negative for chest pain and palpitations.  Gastrointestinal: Positive for abdominal pain, diarrhea, nausea and vomiting. Negative for constipation.  Musculoskeletal: Positive for arthralgias and myalgias. Negative for back pain, joint swelling and neck pain.  Skin: Negative for rash.  Allergic/Immunologic: Positive for environmental allergies.  Neurological: Positive for headaches. Negative for tremors and numbness.  Hematological: Negative for adenopathy. Does not bruise/bleed easily.  Psychiatric/Behavioral: Negative for behavioral problems and sleep disturbance. The patient is not nervous/anxious.    Today's Vitals   01/26/19 0954  Temp: 98.7 F (37.1 C)  Weight: 157 lb (71.2 kg)  Height: 6\' 1"  (1.854 m)   Body mass index is 20.71 kg/m.  Observation/Objective:   The patient is alert and oriented. She is pleasant and answers all questions appropriately. Breathing is non-labored. She is in no acute distress at this time.  The patient looks as though she is not feeling well.    Assessment/Plan: 1. Gastroenteritis presumed infectious Unsure of cause but will treat for infection. Start cipro 500mg  bid for 10 days. Rest and increase fluids. The 'BRAT' diet is suggested, then progress to diet as tolerated as symptoms abate. Call if bloody stools, persistent diarrhea, vomiting, fever or abdominal pain. - ciprofloxacin (CIPRO) 500 MG tablet; Take 1 tablet (500 mg total) by mouth 2 (two) times daily.  Dispense: 20 tablet; Refill: 0  2. Non-intractable vomiting with nausea, unspecified vomiting type zofran ODT 8mg  may be taken up to three times daily if needed for nausea and vomiting. The 'BRAT' diet is suggested, then progress to diet as tolerated as symptoms abate. Call if bloody stools, persistent diarrhea, vomiting,  fever or abdominal pain. - ondansetron (ZOFRAN-ODT) 8 MG disintegrating tablet; Take 1 tablet (8 mg total) by mouth every 8 (  eight) hours as needed for nausea or vomiting.  Dispense: 30 tablet; Refill: 1  3. Migraine without status migrainosus, not intractable, unspecified migraine type New prescription for fioricet without codeine sent to her pharmacy. May take 1 to 2 tablets as needed and as prescribed for acute migraines.  - butalbital-acetaminophen-caffeine (FIORICET) 50-325-40 MG tablet; Take 1-2 tablets by mouth every 6 (six) hours as needed for headache.  Dispense: 45 tablet; Refill: 1  4. Chronic abdominal pain Refill current prescription for fentanyl 84mcg/hr patch and lyrica 50mg  capsules. Sent new prescriptions to her pharmacy.  - pregabalin (LYRICA) 50 MG capsule; Take 1 capsule (50 mg total) by mouth 3 (three) times daily.  Dispense: 90 capsule; Refill: 3 - fentaNYL (DURAGESIC) 12 MCG/HR; Place 1 patch onto the skin every 3 (three) days.  Dispense: 10 patch; Refill: 0  General Counseling: Lan verbalizes understanding of the findings of today's phone visit and agrees with plan of treatment. I have discussed any further diagnostic evaluation that may be needed or ordered today. We also reviewed her medications today. she has been encouraged to call the office with any questions or concerns that should arise related to todays visit.  Rest and increase fluids. Continue using OTC medication to control symptoms.   Reviewed risks and possible side effects associated with taking opiates, benzodiazepines and other CNS depressants. Combination of these could cause dizziness and drowsiness. Advised patient not to drive or operate machinery when taking these medications, as patient's and other's life can be at risk and will have consequences. Patient verbalized understanding in this matter. Dependence and abuse for these drugs will be monitored closely. A Controlled substance policy and procedure  is on file which allows [facility] to order a urine drug screen test at any visit. Patient understands and agrees with the plan  This patient was seen by Leretha Pol FNP Collaboration with Dr Lavera Guise as a part of collaborative care agreement  Meds ordered this encounter  Medications  . ciprofloxacin (CIPRO) 500 MG tablet    Sig: Take 1 tablet (500 mg total) by mouth 2 (two) times daily.    Dispense:  20 tablet    Refill:  0    Order Specific Question:   Supervising Provider    Answer:   Lavera Guise [[id number]]  . ondansetron (ZOFRAN-ODT) 8 MG disintegrating tablet    Sig: Take 1 tablet (8 mg total) by mouth every 8 (eight) hours as needed for nausea or vomiting.    Dispense:  30 tablet    Refill:  1    Order Specific Question:   Supervising Provider    Answer:   Lavera Guise [[id number]]  . pregabalin (LYRICA) 50 MG capsule    Sig: Take 1 capsule (50 mg total) by mouth 3 (three) times daily.    Dispense:  90 capsule    Refill:  3    Order Specific Question:   Supervising Provider    Answer:   Lavera Guise [[id number]]  . fentaNYL (DURAGESIC) 12 MCG/HR    Sig: Place 1 patch onto the skin every 3 (three) days.    Dispense:  10 patch    Refill:  0    Order Specific Question:   Supervising Provider    Answer:   Lavera Guise [[id number]]  . butalbital-acetaminophen-caffeine (FIORICET) 50-325-40 MG tablet    Sig: Take 1-2 tablets by mouth every 6 (six) hours as needed for headache.    Dispense:  45 tablet  Refill:  1    Order Specific Question:   Supervising Provider    Answer:   Lavera Guise [[id number]]    Time spent: 59 Minutes    Dr Lavera Guise Internal medicine

## 2019-01-29 NOTE — Progress Notes (Deleted)
[facility]  [phone] [phone]) [phone]  ID: Stacy Pineda OB: 10/12/1980  MR#: [id number]  HUT#:[id number]  Patient Care Team: Ronnell Freshwater, NP as PCP - General (Family Medicine)  CHIEF COMPLAINT:  Iron deficiency anemia, beta thalassemia minor.  INTERVAL HISTORY: Patient returns to clinic today for repeat laboratory work, further evaluation, and consideration of additional IV Feraheme.  She currently feels well and is asymptomatic.  She denies any weakness or fatigue. She has no neurologic complaints. She has a good appetite and denies weight loss.  She denies any recent fevers or illnesses.  She denies any chest pain or shortness of breath. She denies any nausea, vomiting, constipation, or diarrhea.  She has no melena or hematochezia.  She has no urinary complaints.  Patient feels at her baseline offers no specific complaints today.  REVIEW OF SYSTEMS:   Review of Systems  Constitutional: Negative.  Negative for fever, malaise/fatigue and weight loss.  Respiratory: Negative.  Negative for cough and shortness of breath.   Cardiovascular: Negative.  Negative for chest pain and leg swelling.  Gastrointestinal: Negative.  Negative for abdominal pain, blood in stool and melena.  Genitourinary: Negative.  Negative for hematuria.  Musculoskeletal: Negative.  Negative for back pain.  Skin: Negative.  Negative for rash.  Neurological: Negative.  Negative for focal weakness, weakness and headaches.  Psychiatric/Behavioral: Negative.  The patient is not nervous/anxious.     As per HPI. Otherwise, a complete review of systems is negative.  PAST MEDICAL HISTORY: Past Medical History:  Diagnosis Date  . Anxiety   . Asthma   . Chronic headaches   . Depression   . Pancreatitis   . Pancreatitis   . PTSD (post-traumatic stress disorder)     PAST SURGICAL HISTORY: Past Surgical History:  Procedure Laterality Date  . ABDOMINAL SURGERY    .  CHOLECYSTECTOMY    . COLONOSCOPY WITH PROPOFOL N/A 09/08/2018   Procedure: COLONOSCOPY WITH PROPOFOL;  Surgeon: Virgel Manifold, MD;  Location: [facility] END[facility]COPY;  Service: Endoscopy;  Laterality: N/A;  . DECUBITUS ULCER EXCISION    . DILATION AND CURETTAGE OF UTERUS    . ESOPHAGOGASTRODUODENOSCOPY (EGD) WITH PROPOFOL N/A 09/08/2018   Procedure: ESOPHAGOGASTRODUODENOSCOPY (EGD) WITH PROPOFOL;  Surgeon: Virgel Manifold, MD;  Location: [facility] ENDOSCOPY;  Service: Endoscopy;  Laterality: N/A;  . GASTRIC BYPASS    . GASTRIC BYPASS    . REVISION GASTRIC RESTRICTIVE PROCEDURE FOR MORBID OBESITY      FAMILY HISTORY Family History  Problem Relation Age of Onset  . Graves' disease Mother   . Hypertension Mother   . Hyperlipidemia Mother   . Anxiety disorder Father   . Prostate cancer Father   . Depression Brother   . Anxiety disorder Brother   . Anxiety disorder Maternal Aunt   . Depression Maternal Aunt   . Anxiety disorder Paternal Aunt   . Depression Paternal Aunt   . Anxiety disorder Maternal Uncle   . Depression Maternal Uncle   . Anxiety disorder Paternal Uncle   . Depression Paternal Uncle   . Anxiety disorder Maternal Grandfather   . Depression Maternal Grandfather   . Diabetes Maternal Grandfather   . Anxiety disorder Maternal Grandmother   . Depression Maternal Grandmother   . Breast cancer Maternal Grandmother   . Ovarian cancer Maternal Grandmother   . Diabetes Maternal Grandmother   . Colon cancer Neg Hx        ADVANCED DIRECTIVES:    HEALTH MAINTENANCE: Social  History   Tobacco Use  . Smoking status: Current Some Day Smoker    Packs/day: 0.25    Types: Cigars, E-cigarettes, Cigarettes    Start date: 03/26/2005  . Smokeless tobacco: Never Used  . Tobacco comment: pt smoke weekly  Substance Use Topics  . Alcohol use: No    Alcohol/week: 0.0 standard drinks  . Drug use: Yes    Frequency: 3.0 times per week    Types: Marijuana    Comment: MJ use weekly      Allergies  Allergen Reactions  . Fentanyl Itching  . Morphine And Related Itching    Itchy.   . Vicodin [Hydrocodone-Acetaminophen] Itching    Current Outpatient Medications  Medication Sig Dispense Refill  . albuterol (VENTOLIN HFA) 108 (90 Base) MCG/ACT inhaler Inhale 2 puffs into the lungs every 6 (six) hours as needed for wheezing or shortness of breath. 1 Inhaler 3  . ASMANEX HFA 100 MCG/ACT AERO     . butalbital-acetaminophen-caffeine (FIORICET) 50-325-40 MG tablet Take 1-2 tablets by mouth every 6 (six) hours as needed for headache. 45 tablet 1  . ciprofloxacin (CIPRO) 500 MG tablet Take 1 tablet (500 mg total) by mouth 2 (two) times daily. 20 tablet 0  . clonazePAM (KLONOPIN) 1 MG tablet Take 1 mg by mouth 2 (two) times daily.    . fentaNYL (DURAGESIC) 12 MCG/HR Place 1 patch onto the skin every 3 (three) days. 10 patch 0  . medroxyPROGESTERone (DEPO-PROVERA) 150 MG/ML injection Inject 1 mL (150 mg total) into the muscle every 3 (three) months. 1 mL 2  . mirtazapine (REMERON) 45 MG tablet Take 1 tablet (45 mg total) by mouth at bedtime. 90 tablet 0  . Multiple Vitamins-Minerals (MULTIVITAMIN ADULT PO) Take 1 tablet by mouth.    . ondansetron (ZOFRAN-ODT) 8 MG disintegrating tablet Take 1 tablet (8 mg total) by mouth every 8 (eight) hours as needed for nausea or vomiting. 30 tablet 1  . prazosin (MINIPRESS) 2 MG capsule Take 2 mg by mouth at bedtime.    . pregabalin (LYRICA) 50 MG capsule Take 1 capsule (50 mg total) by mouth 3 (three) times daily. 90 capsule 3  . promethazine (PHENERGAN) 6.25 MG/5ML syrup TAKE  10 ML BY MOUTH EVERY 8 HOURS AS NEEDED FOR NAUSEA AND VOMITING OR  REFRACTORY  NAUSEA/VOMITING 240 mL 0  . Ubrogepant (UBRELVY) 50 MG TABS Take 50 mg by mouth 2 (two) times daily as needed. 10 tablet 2  . venlafaxine XR (EFFEXOR-XR) 150 MG 24 hr capsule TAKE 2 CAPSULES BY MOUTH IN THE MORNING     No current facility-administered medications for this visit.      OBJECTIVE: There were no vitals filed for this visit.   There is no height or weight on file to calculate BMI.    ECOG FS:0 - Asymptomatic  General: Well-developed, well-nourished, no acute distress. Eyes: Pink conjunctiva, anicteric sclera. HEENT: Normocephalic, moist mucous membranes. Lungs: Clear to auscultation bilaterally. Heart: Regular rate and rhythm. No rubs, murmurs, or gallops. Abdomen: Soft, nontender, nondistended. No organomegaly noted, normoactive bowel sounds. Musculoskeletal: No edema, cyanosis, or clubbing. Neuro: Alert, answering all questions appropriately. Cranial nerves grossly intact. Skin: No rashes or petechiae noted. Psych: Normal affect.  LAB RESULTS:  Lab Results  Component Value Date   NA 142 08/19/2018   K 4.2 08/19/2018   CL 108 (H) 08/19/2018   CO2 19 (L) 08/19/2018   GLUCOSE 84 08/19/2018   BUN 9 08/19/2018   CREATININE 0.59 08/19/2018  CALCIUM 8.8 08/19/2018   PROT 6.7 08/19/2018   ALBUMIN 4.5 08/19/2018   AST 19 08/19/2018   ALT 24 08/19/2018   ALKPHOS 145 (H) 08/19/2018   BILITOT 0.4 08/19/2018   GFRNONAA 116 08/19/2018   GFRAA 134 08/19/2018    Lab Results  Component Value Date   WBC 7.9 10/15/2018   NEUTROABS 4.7 10/15/2018   HGB 11.8 (L) 10/15/2018   HCT 35.1 (L) 10/15/2018   MCV 75.2 (L) 10/15/2018   PLT 277 10/15/2018   Lab Results  Component Value Date   IRON 117 10/15/2018   TIBC 357 10/15/2018   IRONPCTSAT 33 (H) 10/15/2018    Lab Results  Component Value Date   FERRITIN 16 10/15/2018     STUDIES: No results found.  ASSESSMENT: Iron deficiency anemia, beta thalassemia minor.  PLAN:    1. Iron deficiency anemia: Patient's hemoglobin has improved to 11.7 and is now nearly within normal limits.  She likely has a baseline anemia given her underlying beta thalassemia.  Iron stores continue to be within normal limits. Previously, the remainder of her laboratory work was either negative or within normal limits.   She does not require additional Feraheme today.  Patient last received treatment on March 05, 2018.  Return to clinic in 4 months with repeat laboratory work and further evaluation.  If her hemoglobin and iron stores remain stable, she likely can be discharged from clinic. 2. Heavy menses: Resolved. Continue Depo Provera per gynecology.  3. Beta thalassemia minor: Confirmed by hemoglobin electrophoresis.  Patient will likely have a baseline microcytosis. 4. Smudge cells: Previously noted on peripheral blood smear.  Peripheral blood flow cytometry was negative.  Patient expressed understanding and was in agreement with this plan. She also understands that She can call clinic at any time with any questions, concerns, or complaints.    Lloyd Huger, MD 01/29/19 8:03 AM

## 2019-02-01 ENCOUNTER — Ambulatory Visit (INDEPENDENT_AMBULATORY_CARE_PROVIDER_SITE_OTHER): Payer: Medicare Other | Admitting: Gastroenterology

## 2019-02-01 ENCOUNTER — Other Ambulatory Visit: Payer: Self-pay

## 2019-02-01 ENCOUNTER — Encounter: Payer: Self-pay | Admitting: Gastroenterology

## 2019-02-01 VITALS — BP 119/74 | HR 82 | Temp 98.5°F | Resp 17 | Ht 73.0 in | Wt 160.2 lb

## 2019-02-01 DIAGNOSIS — K625 Hemorrhage of anus and rectum: Secondary | ICD-10-CM | POA: Diagnosis not present

## 2019-02-01 DIAGNOSIS — K641 Second degree hemorrhoids: Secondary | ICD-10-CM

## 2019-02-01 DIAGNOSIS — K529 Noninfective gastroenteritis and colitis, unspecified: Secondary | ICD-10-CM | POA: Diagnosis not present

## 2019-02-01 MED ORDER — AMITRIPTYLINE HCL 25 MG PO TABS: 25.0000 mg | ORAL_TABLET | Freq: Every day | ORAL | 1 refills | Status: DC

## 2019-02-01 NOTE — Progress Notes (Signed)
Beaulah Corin, MD [address]  [place]  [place], [place] [address]  Main: [phone]  Fax: [phone] Pager: [phone]   Primary Care Physician: Ronnell Freshwater, NP  Primary Gastroenterologist:  Dr. [name]  Chief Complaint  Patient presents with  . Rectal Bleeding    Hemorrhoids    HPI: Stacy Pineda is a 38 y.o. female with history of laparoscopic reversal of gastric bypass, partial gastrectomy in 2015 secondary to chronic recurrent marginal ulcer, cholecystectomy is referred to me by Dr. Bonna Gains to evaluate for hemorrhoid ligation due to intermittent rectal bleeding.  Patient underwent colonoscopy for chronic diarrhea and was found to have internal hemorrhoids.  She had random colon biopsies which were unremarkable for any inflammation.  Patient reports ongoing diarrhea.  She is currently on ciprofloxacin started by her PCP for nausea and vomiting, thought to be viral gastroenteritis.  Patient is more concerned about ongoing diarrhea more than rectal bleeding, has postprandial urgency.  She does have chronic iron deficiency anemia, closely followed by hematology for parenteral iron.  Patient has history of PTSD, anxiety and depression and has been going through a lot of health issues.  Her quality of life is significantly disrupted due to her GI symptoms and hoping to find some answers.  She is currently on Remeron, Klonopin, Effexor managed by her psychiatrist  Current Outpatient Medications  Medication Sig Dispense Refill  . albuterol (VENTOLIN HFA) 108 (90 Base) MCG/ACT inhaler Inhale 2 puffs into the lungs every 6 (six) hours as needed for wheezing or shortness of breath. 1 Inhaler 3  . ASMANEX HFA 100 MCG/ACT AERO     . butalbital-acetaminophen-caffeine (FIORICET) 50-325-40 MG tablet Take 1-2 tablets by mouth every 6 (six) hours as needed for headache. 45 tablet 1  . ciprofloxacin (CIPRO) 500 MG tablet Take 1 tablet (500 mg total) by mouth 2 (two)  times daily. 20 tablet 0  . clonazePAM (KLONOPIN) 1 MG tablet Take 1 mg by mouth 2 (two) times daily.    . fentaNYL (DURAGESIC) 12 MCG/HR Place 1 patch onto the skin every 3 (three) days. 10 patch 0  . hydrOXYzine (ATARAX/VISTARIL) 10 MG tablet TAKE 1 TO 2 TABLETS BY MOUTH DAILY FOR BREAKTHOUGH ANXIETY.    . medroxyPROGESTERone (DEPO-PROVERA) 150 MG/ML injection Inject 1 mL (150 mg total) into the muscle every 3 (three) months. 1 mL 2  . mirtazapine (REMERON) 45 MG tablet Take 1 tablet (45 mg total) by mouth at bedtime. 90 tablet 0  . Multiple Vitamins-Minerals (MULTIVITAMIN ADULT PO) Take 1 tablet by mouth.    . ondansetron (ZOFRAN-ODT) 8 MG disintegrating tablet Take 1 tablet (8 mg total) by mouth every 8 (eight) hours as needed for nausea or vomiting. 30 tablet 1  . prazosin (MINIPRESS) 5 MG capsule TAKE 1 CAPSULE BY MOUTH NIGHTLY    . pregabalin (LYRICA) 50 MG capsule Take 1 capsule (50 mg total) by mouth 3 (three) times daily. 90 capsule 3  . promethazine (PHENERGAN) 6.25 MG/5ML syrup TAKE  10 ML BY MOUTH EVERY 8 HOURS AS NEEDED FOR NAUSEA AND VOMITING OR  REFRACTORY  NAUSEA/VOMITING 240 mL 0  . venlafaxine XR (EFFEXOR-XR) 150 MG 24 hr capsule TAKE 2 CAPSULES BY MOUTH IN THE MORNING    . amitriptyline (ELAVIL) 25 MG tablet Take 1 tablet (25 mg total) by mouth at bedtime for 30 days. 30 tablet 1  . prazosin (MINIPRESS) 2 MG capsule Take 2 mg by mouth at bedtime.    Marland Kitchen  Ubrogepant (UBRELVY) 50 MG TABS Take 50 mg by mouth 2 (two) times daily as needed. (Patient not taking: Reported on 02/01/2019) 10 tablet 2   No current facility-administered medications for this visit.     Allergies as of 02/01/2019 - Review Complete 02/01/2019  Allergen Reaction Noted  . Fentanyl Itching 03/27/2015  . Morphine and related Itching 03/27/2015  . Vicodin [hydrocodone-acetaminophen] Itching 03/27/2015    NSAIDs: None  Antiplts/Anticoagulants/Anti thrombotics: None  GI procedures: Reviewed  ROS:   General: Negative for anorexia, weight loss, fever, chills, fatigue, weakness. ENT: Negative for hoarseness, difficulty swallowing , nasal congestion. CV: Negative for chest pain, angina, palpitations, dyspnea on exertion, peripheral edema.  Respiratory: Negative for dyspnea at rest, dyspnea on exertion, cough, sputum, wheezing.  GI: See history of present illness. GU:  Negative for dysuria, hematuria, urinary incontinence, urinary frequency, nocturnal urination.  Endo: Negative for unusual weight change.    Physical Examination:   BP 119/74 (BP Location: Left Arm, Patient Position: Sitting, Cuff Size: Normal)   Pulse 82   Temp 98.5 F (36.9 C)   Resp 17   Ht 6\' 1"  (1.854 m)   Wt 160 lb 3.2 oz (72.7 kg)   BMI 21.14 kg/m   General: Well-nourished, well-developed in no acute distress.  Eyes: No icterus. Conjunctivae pink. Mouth: Oropharyngeal mucosa moist and pink , no lesions erythema or exudate. Lungs: Clear to auscultation bilaterally. Non-labored. Heart: Regular rate and rhythm, no murmurs rubs or gallops.  Abdomen: Bowel sounds are normal, nontender, nondistended, no hepatosplenomegaly or masses, no hernia , no rebound or guarding.   Extremities: No lower extremity edema. No clubbing or deformities. Neuro: Alert and oriented x 3.  Grossly intact. Skin: Warm and dry, no jaundice.   Psych: Alert and cooperative, normal mood and affect.   Imaging Studies: Reviewed  Assessment and Plan:   Stacy Pineda is a 38 y.o. pleasant female history of gastric bypass, reversal, chronic iron deficiency anemia, chronic diarrhea with intermittent rectal bleeding attributed to internal hemorrhoids.  EGD and colonoscopy were fairly unremarkable  Chronic diarrhea and abdominal pain Discussed with her about finishing the course of ciprofloxacin which is also used to treat for bacterial overgrowth Consider rifaximin empirically if Cipro does not help Also, discussed with her about trial of  low-dose amitriptyline and patient is agreeable Start amitriptyline 25 mg at bedtime and increase to 50mg  if able to tolerate Suggested her to discuss with her psychiatrist about switching from Remeron to Zyprexa due to persistent nausea Patient was appreciative of the time spent discussing about various management options for chronic diarrhea  Rectal bleeding from internal hemorrhoids Discussed with patient about hemorrhoid ligation including risks and benefits of it However, patient is not prepared to undergo this procedure today.  She prefers to schedule to a later date so that she can have someone to drive her I am worried that with ongoing diarrhea, she may continue to have symptomatic hemorrhoids I provided her with information about [facility] hemorrhoid ligation  Chronic iron deficiency anemia Recommend checking serum copper levels by her hematologist  Follow up as needed   Dr Sherri Sear, MD

## 2019-02-02 NOTE — Progress Notes (Signed)
Date last pap: . Last Depo-Provera: 10/29/18. Side Effects if any: none  Serum HCG indicated? n/a. Depo-Provera 150 mg IM given by: FH, LPN. Next appointment due Sept 10-Sept 24, 2020.    BP 131/87   Pulse (!) 123   Ht 6\' 1"  (1.854 m)   Wt 160 lb 1.6 oz (72.6 kg)   BMI 21.12 kg/m

## 2019-02-03 ENCOUNTER — Other Ambulatory Visit: Payer: Self-pay

## 2019-02-03 ENCOUNTER — Ambulatory Visit (INDEPENDENT_AMBULATORY_CARE_PROVIDER_SITE_OTHER): Payer: Medicare Other | Admitting: Certified Nurse Midwife

## 2019-02-03 ENCOUNTER — Ambulatory Visit (INDEPENDENT_AMBULATORY_CARE_PROVIDER_SITE_OTHER): Payer: Medicare Other

## 2019-02-03 VITALS — BP 131/87 | HR 103 | Ht 73.0 in | Wt 160.1 lb

## 2019-02-03 DIAGNOSIS — Z01419 Encounter for gynecological examination (general) (routine) without abnormal findings: Secondary | ICD-10-CM | POA: Diagnosis not present

## 2019-02-03 DIAGNOSIS — Z8742 Personal history of other diseases of the female genital tract: Secondary | ICD-10-CM

## 2019-02-03 DIAGNOSIS — Z3042 Encounter for surveillance of injectable contraceptive: Secondary | ICD-10-CM | POA: Diagnosis not present

## 2019-02-03 DIAGNOSIS — Z87898 Personal history of other specified conditions: Secondary | ICD-10-CM | POA: Diagnosis not present

## 2019-02-03 DIAGNOSIS — R102 Pelvic and perineal pain unspecified side: Secondary | ICD-10-CM

## 2019-02-03 DIAGNOSIS — N921 Excessive and frequent menstruation with irregular cycle: Secondary | ICD-10-CM

## 2019-02-03 DIAGNOSIS — Z8489 Family history of other specified conditions: Secondary | ICD-10-CM

## 2019-02-03 DIAGNOSIS — D509 Iron deficiency anemia, unspecified: Secondary | ICD-10-CM

## 2019-02-03 MED ORDER — MEDROXYPROGESTERONE ACETATE 150 MG/ML IM SUSP
150.0000 mg | Freq: Once | INTRAMUSCULAR | Status: AC
  Administered 2019-02-03: 150 mg via INTRAMUSCULAR

## 2019-02-03 NOTE — Patient Instructions (Addendum)
Medroxyprogesterone injection [Contraceptive] What is this medicine? MEDROXYPROGESTERONE (me DROX ee proe JES te rone) contraceptive injections prevent pregnancy. They provide effective birth control for 3 months. Depo-subQ Provera 104 is also used for treating pain related to endometriosis. This medicine may be used for other purposes; ask your health care provider or pharmacist if you have questions. COMMON BRAND NAME(S): Depo-Provera, Depo-subQ Provera 104 What should I tell my health care provider before I take this medicine? They need to know if you have any of these conditions: -frequently drink alcohol -asthma -blood vessel disease or a history of a blood clot in the lungs or legs -bone disease such as osteoporosis -breast cancer -diabetes -eating disorder (anorexia nervosa or bulimia) -high blood pressure -HIV infection or AIDS -kidney disease -liver disease -mental depression -migraine -seizures (convulsions) -stroke -tobacco smoker -vaginal bleeding -an unusual or allergic reaction to medroxyprogesterone, other hormones, medicines, foods, dyes, or preservatives -pregnant or trying to get pregnant -breast-feeding How should I use this medicine? Depo-Provera Contraceptive injection is given into a muscle. Depo-subQ Provera 104 injection is given under the skin. These injections are given by a health care professional. You must not be pregnant before getting an injection. The injection is usually given during the first 5 days after the start of a menstrual period or 6 weeks after delivery of a baby. Talk to your pediatrician regarding the use of this medicine in children. Special care may be needed. These injections have been used in female children who have started having menstrual periods. Overdosage: If you think you have taken too much of this medicine contact a poison control center or emergency room at once. NOTE: This medicine is only for you. Do not share this medicine  with others. What if I miss a dose? Try not to miss a dose. You must get an injection once every 3 months to maintain birth control. If you cannot keep an appointment, call and reschedule it. If you wait longer than 13 weeks between Depo-Provera contraceptive injections or longer than 14 weeks between Depo-subQ Provera 104 injections, you could get pregnant. Use another method for birth control if you miss your appointment. You may also need a pregnancy test before receiving another injection. What may interact with this medicine? Do not take this medicine with any of the following medications: -bosentan This medicine may also interact with the following medications: -aminoglutethimide -antibiotics or medicines for infections, especially rifampin, rifabutin, rifapentine, and griseofulvin -aprepitant -barbiturate medicines such as phenobarbital or primidone -bexarotene -carbamazepine -medicines for seizures like ethotoin, felbamate, oxcarbazepine, phenytoin, topiramate -modafinil -St. John's wort This list may not describe all possible interactions. Give your health care provider a list of all the medicines, herbs, non-prescription drugs, or dietary supplements you use. Also tell them if you smoke, drink alcohol, or use illegal drugs. Some items may interact with your medicine. What should I watch for while using this medicine? This drug does not protect you against HIV infection (AIDS) or other sexually transmitted diseases. Use of this product may cause you to lose calcium from your bones. Loss of calcium may cause weak bones (osteoporosis). Only use this product for more than 2 years if other forms of birth control are not right for you. The longer you use this product for birth control the more likely you will be at risk for weak bones. Ask your health care professional how you can keep strong bones. You may have a change in bleeding pattern or irregular periods. Many females stop having    periods while taking this drug. If you have received your injections on time, your chance of being pregnant is very low. If you think you may be pregnant, see your health care professional as soon as possible. Tell your health care professional if you want to get pregnant within the next year. The effect of this medicine may last a long time after you get your last injection. What side effects may I notice from receiving this medicine? Side effects that you should report to your doctor or health care professional as soon as possible: -allergic reactions like skin rash, itching or hives, swelling of the face, lips, or tongue -breast tenderness or discharge -breathing problems -changes in vision -depression -feeling faint or lightheaded, falls -fever -pain in the abdomen, chest, groin, or leg -problems with balance, talking, walking -unusually weak or tired -yellowing of the eyes or skin Side effects that usually do not require medical attention (report to your doctor or health care professional if they continue or are bothersome): -acne -fluid retention and swelling -headache -irregular periods, spotting, or absent periods -temporary pain, itching, or skin reaction at site where injected -weight gain This list may not describe all possible side effects. Call your doctor for medical advice about side effects. You may report side effects to FDA at 1-800-FDA-1088. Where should I keep my medicine? This does not apply. The injection will be given to you by a health care professional. NOTE: This sheet is a summary. It may not cover all possible information. If you have questions about this medicine, talk to your doctor, pharmacist, or health care provider.  2019 Elsevier/Gold Standard (2008-08-18 18:37:56)   Ovarian Cyst An ovarian cyst is a fluid-filled sac on an ovary. The ovaries are organs that make eggs in women. Most ovarian cysts go away on their own and are not cancerous (are  benign). Some cysts need treatment. Follow these instructions at home:  Take over-the-counter and prescription medicines only as told by your doctor.  Do not drive or use heavy machinery while taking prescription pain medicine.  Get pelvic exams and Pap tests as often as told by your doctor.  Return to your normal activities as told by your doctor. Ask your doctor what activities are safe for you.  Do not use any products that contain nicotine or tobacco, such as cigarettes and e-cigarettes. If you need help quitting, ask your doctor.  Keep all follow-up visits as told by your doctor. This is important. Contact a doctor if:  Your periods are: ? Late. ? Irregular. ? Painful.   Your periods stop.  You have pelvic pain that does not go away.  You have pressure on your bladder.  You have trouble making your bladder empty when you pee (urinate).  You have pain during sex.  You have any of the following in your belly (abdomen): ? A feeling of fullness. ? Pressure. ? Discomfort. ? Pain that does not go away. ? Swelling.  You feel sick most of the time.  You have trouble pooping (have constipation).  You are not as hungry as usual (you lose your appetite).  You get very bad acne.  You start to have more hair on your body and face.  You are gaining weight or losing weight without changing your exercise and eating habits.  You think you may be pregnant. Get help right away if:  You have belly pain that is very bad or gets worse.  You cannot eat or drink without throwing up (vomiting).  You suddenly get a fever.  Your period is a lot heavier than usual. This information is not intended to replace advice given to you by your health care provider. Make sure you discuss any questions you have with your health care provider. Document Released: 01/14/2008 Document Revised: 02/15/2016 Document Reviewed: 12/30/2015 Elsevier Interactive Patient Education  2019 [place].    Ovarian Cyst     An ovarian cyst is a fluid-filled sac that forms on an ovary. The ovaries are small organs that produce eggs in women. Various types of cysts can form on the ovaries. Some may cause symptoms and require treatment. Most ovarian cysts go away on their own, are not cancerous (are benign), and do not cause problems. Common types of ovarian cysts include:  Functional (follicle) cysts. ? Occur during the menstrual cycle, and usually go away with the next menstrual cycle if you do not get pregnant. ? Usually cause no symptoms.  Endometriomas. ? Are cysts that form from the tissue that lines the uterus (endometrium). ? Are sometimes called "chocolate cysts" because they become filled with blood that turns brown. ? Can cause pain in the lower abdomen during intercourse and during your period.  Cystadenoma cysts. ? Develop from cells on the outside surface of the ovary. ? Can get very large and cause lower abdomen pain and pain with intercourse. ? Can cause severe pain if they twist or break open (rupture).  Dermoid cysts. ? Are sometimes found in both ovaries. ? May contain different kinds of body tissue, such as skin, teeth, hair, or cartilage. ? Usually do not cause symptoms unless they get very big.  Theca lutein cysts. ? Occur when too much of a certain hormone (human chorionic gonadotropin) is produced and overstimulates the ovaries to produce an egg. ? Are most common after having procedures used to assist with the conception of a baby (in vitro fertilization). What are the causes? Ovarian cysts may be caused by:  Ovarian hyperstimulation syndrome. This is a condition that can develop from taking fertility medicines. It causes multiple large ovarian cysts to form.  Polycystic ovarian syndrome (PCOS). This is a common hormonal disorder that can cause ovarian cysts, as well as problems with your period or fertility. What increases the risk? The following  factors may make you more likely to develop ovarian cysts:  Being overweight or obese.  Taking fertility medicines.  Taking certain forms of hormonal birth control.  Smoking. What are the signs or symptoms? Many ovarian cysts do not cause symptoms. If symptoms are present, they may include:  Pelvic pain or pressure.  Pain in the lower abdomen.  Pain during sex.  Abdominal swelling.  Abnormal menstrual periods.  Increasing pain with menstrual periods. How is this diagnosed? These cysts are commonly found during a routine pelvic exam. You may have tests to find out more about the cyst, such as:  Ultrasound.  X-ray of the pelvis.  CT scan.  MRI.  Blood tests. How is this treated? Many ovarian cysts go away on their own without treatment. Your health care provider may want to check your cyst regularly for 2-3 months to see if it changes. If you are in menopause, it is especially important to have your cyst monitored closely because menopausal women have a higher rate of ovarian cancer. When treatment is needed, it may include:  Medicines to help relieve pain.  A procedure to drain the cyst (aspiration).  Surgery to remove the whole cyst.  Hormone treatment  or birth control pills. These methods are sometimes used to help dissolve a cyst. Follow these instructions at home:  Take over-the-counter and prescription medicines only as told by your health care provider.  Do not drive or use heavy machinery while taking prescription pain medicine.  Get regular pelvic exams and Pap tests as often as told by your health care provider.  Return to your normal activities as told by your health care provider. Ask your health care provider what activities are safe for you.  Do not use any products that contain nicotine or tobacco, such as cigarettes and e-cigarettes. If you need help quitting, ask your health care provider.  Keep all follow-up visits as told by your health care  provider. This is important. Contact a health care provider if:  Your periods are late, irregular, or painful, or they stop.  You have pelvic pain that does not go away.  You have pressure on your bladder or trouble emptying your bladder completely.  You have pain during sex.  You have any of the following in your abdomen: ? A feeling of fullness. ? Pressure. ? Discomfort. ? Pain that does not go away. ? Swelling.  You feel generally ill.  You become constipated.  You lose your appetite.  You develop severe acne.  You start to have more body hair and facial hair.  You are gaining weight or losing weight without changing your exercise and eating habits.  You think you may be pregnant. Get help right away if:  You have abdominal pain that is severe or gets worse.  You cannot eat or drink without vomiting.  You suddenly develop a fever.  Your menstrual period is much heavier than usual. This information is not intended to replace advice given to you by your health care provider. Make sure you discuss any questions you have with your health care provider. Document Released: 07/28/2005 Document Revised: 02/15/2016 Document Reviewed: 12/30/2015 Elsevier Interactive Patient Education  2019 [employer].

## 2019-02-04 ENCOUNTER — Inpatient Hospital Stay: Payer: Medicare Other | Admitting: Oncology

## 2019-02-04 ENCOUNTER — Inpatient Hospital Stay: Payer: Medicare Other

## 2019-02-12 NOTE — Progress Notes (Signed)
[name]  [phone] [phone]) [phone]  ID: Stacy Pineda OB: 1980/10/03  MR#: [id number]  EQA#:[id number]  Patient Care Team: Ronnell Freshwater, NP as PCP - General (Family Medicine)  CHIEF COMPLAINT:  Iron deficiency anemia, beta thalassemia minor.  INTERVAL HISTORY: Patient returns to clinic today for repeat laboratory work and further evaluation.  She continues to feel well and remains asymptomatic.  She does not complain of any weakness or fatigue.  She has no neurologic complaints. She has a good appetite and denies weight loss.  She denies any recent fevers or illnesses.  She denies any chest pain, shortness of breath, cough, or hemoptysis.  She denies any nausea, vomiting, constipation, or diarrhea.  She has no melena or hematochezia.  She has no urinary complaints.  Patient feels at her baseline offers no specific complaints today.  REVIEW OF SYSTEMS:   Review of Systems  Constitutional: Negative.  Negative for fever, malaise/fatigue and weight loss.  Respiratory: Negative.  Negative for cough and shortness of breath.   Cardiovascular: Negative.  Negative for chest pain and leg swelling.  Gastrointestinal: Negative.  Negative for abdominal pain, blood in stool and melena.  Genitourinary: Negative.  Negative for hematuria.  Musculoskeletal: Negative.  Negative for back pain.  Skin: Negative.  Negative for rash.  Neurological: Negative.  Negative for focal weakness, weakness and headaches.  Psychiatric/Behavioral: Negative.  The patient is not nervous/anxious.     As per HPI. Otherwise, a complete review of systems is negative.  PAST MEDICAL HISTORY: Past Medical History:  Diagnosis Date   Anxiety    Asthma    Chronic headaches    Depression    Pancreatitis    Pancreatitis    PTSD (post-traumatic stress disorder)     PAST SURGICAL HISTORY: Past Surgical History:  Procedure Laterality Date   ABDOMINAL SURGERY      CHOLECYSTECTOMY     COLONOSCOPY WITH PROPOFOL N/A 09/08/2018   Procedure: COLONOSCOPY WITH PROPOFOL;  Surgeon: Virgel Manifold, MD;  Location: [facility] END[facility]COPY;  Service: Endoscopy;  Laterality: N/A;   DECUBITUS ULCER EXCISION     DILATION AND CURETTAGE OF UTERUS     ESOPHAGOGASTRODUODENOSCOPY (EGD) WITH PROPOFOL N/A 09/08/2018   Procedure: ESOPHAGOGASTRODUODENOSCOPY (EGD) WITH PROPOFOL;  Surgeon: Virgel Manifold, MD;  Location: [facility] ENDOSCOPY;  Service: Endoscopy;  Laterality: N/A;   GASTRIC BYPASS     GASTRIC BYPASS     REVISION GASTRIC RESTRICTIVE PROCEDURE FOR MORBID OBESITY      FAMILY HISTORY Family History  Problem Relation Age of Onset   Graves' disease Mother    Hypertension Mother    Hyperlipidemia Mother    Anxiety disorder Father    Prostate cancer Father    Depression Brother    Anxiety disorder Brother    Anxiety disorder Maternal Aunt    Depression Maternal Aunt    Anxiety disorder Paternal Aunt    Depression Paternal Aunt    Anxiety disorder Maternal Uncle    Depression Maternal Uncle    Anxiety disorder Paternal Uncle    Depression Paternal Uncle    Anxiety disorder Maternal Grandfather    Depression Maternal Grandfather    Diabetes Maternal Grandfather    Anxiety disorder Maternal Grandmother    Depression Maternal Grandmother    Breast cancer Maternal Grandmother    Ovarian cancer Maternal Grandmother    Diabetes Maternal Grandmother    Colon cancer Neg Hx        ADVANCED DIRECTIVES:  HEALTH MAINTENANCE: Social History   Tobacco Use   Smoking status: Current Some Day Smoker    Packs/day: 0.25    Types: Cigars, E-cigarettes, Cigarettes    Start date: 03/26/2005   Smokeless tobacco: Never Used   Tobacco comment: pt smoke weekly  Substance Use Topics   Alcohol use: No    Alcohol/week: 0.0 standard drinks   Drug use: Yes    Frequency: 3.0 times per week    Types: Marijuana    Comment: MJ use weekly       Allergies  Allergen Reactions   Fentanyl Itching   Morphine And Related Itching    Itchy.    Vicodin [Hydrocodone-Acetaminophen] Itching    Current Outpatient Medications  Medication Sig Dispense Refill   albuterol (VENTOLIN HFA) 108 (90 Base) MCG/ACT inhaler Inhale 2 puffs into the lungs every 6 (six) hours as needed for wheezing or shortness of breath. 1 Inhaler 3   amitriptyline (ELAVIL) 25 MG tablet Take 1 tablet (25 mg total) by mouth at bedtime for 30 days. 30 tablet 1   ASMANEX HFA 100 MCG/ACT AERO      butalbital-acetaminophen-caffeine (FIORICET) 50-325-40 MG tablet Take 1-2 tablets by mouth every 6 (six) hours as needed for headache. 45 tablet 1   clonazePAM (KLONOPIN) 1 MG tablet Take 1 mg by mouth 2 (two) times daily.     fentaNYL (DURAGESIC) 12 MCG/HR Place 1 patch onto the skin every 3 (three) days. 10 patch 0   hydrOXYzine (ATARAX/VISTARIL) 10 MG tablet TAKE 1 TO 2 TABLETS BY MOUTH DAILY FOR BREAKTHOUGH ANXIETY.     medroxyPROGESTERone (DEPO-PROVERA) 150 MG/ML injection Inject 1 mL (150 mg total) into the muscle every 3 (three) months. 1 mL 2   mirtazapine (REMERON) 45 MG tablet Take 1 tablet (45 mg total) by mouth at bedtime. 90 tablet 0   Multiple Vitamins-Minerals (MULTIVITAMIN ADULT PO) Take 1 tablet by mouth.     ondansetron (ZOFRAN-ODT) 8 MG disintegrating tablet Take 1 tablet (8 mg total) by mouth every 8 (eight) hours as needed for nausea or vomiting. 30 tablet 1   prazosin (MINIPRESS) 5 MG capsule TAKE 1 CAPSULE BY MOUTH NIGHTLY     pregabalin (LYRICA) 50 MG capsule Take 1 capsule (50 mg total) by mouth 3 (three) times daily. 90 capsule 3   promethazine (PHENERGAN) 6.25 MG/5ML syrup TAKE  10 ML BY MOUTH EVERY 8 HOURS AS NEEDED FOR NAUSEA AND VOMITING OR  REFRACTORY  NAUSEA/VOMITING 240 mL 0   venlafaxine XR (EFFEXOR-XR) 150 MG 24 hr capsule TAKE 2 CAPSULES BY MOUTH IN THE MORNING     No current facility-administered medications for this  visit.     OBJECTIVE: Vitals:   02/15/19 1038  BP: 114/83  Pulse: 90  Resp: 18     Body mass index is 20.05 kg/m.    ECOG FS:0 - Asymptomatic  General: Well-developed, well-nourished, no acute distress. Eyes: Pink conjunctiva, anicteric sclera. HEENT: Normocephalic, moist mucous membranes. Lungs: Clear to auscultation bilaterally. Heart: Regular rate and rhythm. No rubs, murmurs, or gallops. Abdomen: Soft, nontender, nondistended. No organomegaly noted, normoactive bowel sounds. Musculoskeletal: No edema, cyanosis, or clubbing. Neuro: Alert, answering all questions appropriately. Cranial nerves grossly intact. Skin: No rashes or petechiae noted. Psych: Normal affect.  LAB RESULTS:  Lab Results  Component Value Date   NA 142 08/19/2018   K 4.2 08/19/2018   CL 108 (H) 08/19/2018   CO2 19 (L) 08/19/2018   GLUCOSE 84 08/19/2018   BUN 9  08/19/2018   CREATININE 0.59 08/19/2018   CALCIUM 8.8 08/19/2018   PROT 6.7 08/19/2018   ALBUMIN 4.5 08/19/2018   AST 19 08/19/2018   ALT 24 08/19/2018   ALKPHOS 145 (H) 08/19/2018   BILITOT 0.4 08/19/2018   GFRNONAA 116 08/19/2018   GFRAA 134 08/19/2018    Lab Results  Component Value Date   WBC 7.8 02/15/2019   NEUTROABS 4.4 02/15/2019   HGB 12.3 02/15/2019   HCT 36.4 02/15/2019   MCV 72.9 (L) 02/15/2019   PLT 292 02/15/2019   Lab Results  Component Value Date   IRON 107 02/15/2019   TIBC 361 02/15/2019   IRONPCTSAT 30 02/15/2019    Lab Results  Component Value Date   FERRITIN 26 02/15/2019     STUDIES: US Pelvis Transvanginal Non-ob (tv Only)  Result Date: 02/08/2019 Patient Name: Sharine A Pineda DOB: Aug 12, 1980 MRN: [id number] ULTRASOUND REPORT Location: [facility] Date of Service: 02/03/2019 Indications:Pelvic Pain Findings: The uterus is anteverted and measures 7.3 x 3.7 x 4.3 cm. Echo texture is homogenous without evidence of focal masses. The Endometrium measures 2 mm. Right Ovary measures 3.2 x 2.2 x 2.6 cm.  It is normal in appearance.Hypoechoic cystic lesion measuring 1.3 cm. Left Ovary measures 3.6 x 2.4 x 2.9 cm. It is normal in appearance.Hypoechoic cystic lesion measuring 2.0 cm. Survey of the adnexa demonstrates no adnexal masses. There is no free fluid in the cul de sac. Impression: 1. Bilateral small ovarian cysts. Recommendations: 1.Clinical correlation with the patient's History and Physical Exam. Jenine M. Albertine Grates    RDMS The ultrasound images and findings were reviewed by me and I agree with the above report. Finis Bud, M.D. 02/08/2019 8:53 AM   ASSESSMENT: Iron deficiency anemia, beta thalassemia minor.  PLAN:    1. Iron deficiency anemia: Patient's hemoglobin and iron stores are now within normal limits.  Previously, the remainder of her laboratory work was either negative or within normal limits.  She does not require additional Feraheme today.  Patient last received treatment on March 05, 2018.  After lengthy discussion with the patient, it was agreed upon that no further follow-up is necessary.  Please refer patient back if there are any questions or concerns.   2. Heavy menses: Resolved. Continue Depo Provera per gynecology.  3. Beta thalassemia minor: Confirmed by hemoglobin electrophoresis.   4.  Microcytosis: Secondary to beta thalassemia minor.  No intervention needed. 5. Smudge cells: Previously noted on peripheral blood smear.  Peripheral blood flow cytometry was negative.  I spent a total of 20 minutes face-to-face with the patient of which greater than 50% of the visit was spent in counseling and coordination of care as detailed above.   Patient expressed understanding and was in agreement with this plan. She also understands that She can call clinic at any time with any questions, concerns, or complaints.    Lloyd Huger, MD 02/18/19 7:00 AM

## 2019-02-15 ENCOUNTER — Inpatient Hospital Stay (HOSPITAL_BASED_OUTPATIENT_CLINIC_OR_DEPARTMENT_OTHER): Payer: Medicare Other | Admitting: Oncology

## 2019-02-15 ENCOUNTER — Inpatient Hospital Stay: Payer: Medicare Other | Attending: Oncology

## 2019-02-15 ENCOUNTER — Inpatient Hospital Stay: Payer: Medicare Other

## 2019-02-15 ENCOUNTER — Other Ambulatory Visit: Payer: Self-pay

## 2019-02-15 VITALS — BP 114/83 | HR 90 | Resp 18 | Wt 152.0 lb

## 2019-02-15 DIAGNOSIS — D563 Thalassemia minor: Secondary | ICD-10-CM | POA: Insufficient documentation

## 2019-02-15 DIAGNOSIS — F17210 Nicotine dependence, cigarettes, uncomplicated: Secondary | ICD-10-CM | POA: Diagnosis not present

## 2019-02-15 DIAGNOSIS — D509 Iron deficiency anemia, unspecified: Secondary | ICD-10-CM | POA: Insufficient documentation

## 2019-02-15 LAB — CBC WITH DIFFERENTIAL/PLATELET
Abs Immature Granulocytes: 0.03 10*3/uL (ref 0.00–0.07)
Basophils Absolute: 0.1 10*3/uL (ref 0.0–0.1)
Basophils Relative: 1 %
Eosinophils Absolute: 0.2 10*3/uL (ref 0.0–0.5)
Eosinophils Relative: 3 %
HCT: 36.4 % (ref 36.0–46.0)
Hemoglobin: 12.3 g/dL (ref 12.0–15.0)
Immature Granulocytes: 0 %
Lymphocytes Relative: 35 %
Lymphs Abs: 2.7 10*3/uL (ref 0.7–4.0)
MCH: 24.6 pg — ABNORMAL LOW (ref 26.0–34.0)
MCHC: 33.8 g/dL (ref 30.0–36.0)
MCV: 72.9 fL — ABNORMAL LOW (ref 80.0–100.0)
Monocytes Absolute: 0.4 10*3/uL (ref 0.1–1.0)
Monocytes Relative: 5 %
Neutro Abs: 4.4 10*3/uL (ref 1.7–7.7)
Neutrophils Relative %: 56 %
Platelets: 292 10*3/uL (ref 150–400)
RBC: 4.99 MIL/uL (ref 3.87–5.11)
RDW: 18.1 % — ABNORMAL HIGH (ref 11.5–15.5)
WBC: 7.8 10*3/uL (ref 4.0–10.5)
nRBC: 0.6 % — ABNORMAL HIGH (ref 0.0–0.2)

## 2019-02-15 LAB — IRON AND TIBC
Iron: 107 ug/dL (ref 28–170)
Saturation Ratios: 30 % (ref 10.4–31.8)
TIBC: 361 ug/dL (ref 250–450)
UIBC: 254 ug/dL

## 2019-02-15 LAB — FERRITIN: Ferritin: 26 ng/mL (ref 11–307)

## 2019-02-15 NOTE — Progress Notes (Signed)
Patient denies any concerns today.  

## 2019-02-22 ENCOUNTER — Encounter: Payer: Self-pay | Admitting: Nurse Practitioner

## 2019-02-22 ENCOUNTER — Telehealth: Payer: Self-pay | Admitting: *Deleted

## 2019-02-22 ENCOUNTER — Ambulatory Visit: Payer: Medicare Other | Admitting: Nurse Practitioner

## 2019-02-22 ENCOUNTER — Other Ambulatory Visit: Payer: Self-pay

## 2019-02-22 VITALS — Ht 73.0 in | Wt 157.0 lb

## 2019-02-22 DIAGNOSIS — G8929 Other chronic pain: Secondary | ICD-10-CM | POA: Diagnosis not present

## 2019-02-22 DIAGNOSIS — E860 Dehydration: Secondary | ICD-10-CM

## 2019-02-22 DIAGNOSIS — E538 Deficiency of other specified B group vitamins: Secondary | ICD-10-CM

## 2019-02-22 DIAGNOSIS — R197 Diarrhea, unspecified: Secondary | ICD-10-CM | POA: Diagnosis not present

## 2019-02-22 DIAGNOSIS — R5383 Other fatigue: Secondary | ICD-10-CM

## 2019-02-22 DIAGNOSIS — R109 Unspecified abdominal pain: Secondary | ICD-10-CM | POA: Diagnosis not present

## 2019-02-22 DIAGNOSIS — I10 Essential (primary) hypertension: Secondary | ICD-10-CM

## 2019-02-22 DIAGNOSIS — Z20822 Contact with and (suspected) exposure to covid-19: Secondary | ICD-10-CM

## 2019-02-22 DIAGNOSIS — E559 Vitamin D deficiency, unspecified: Secondary | ICD-10-CM | POA: Diagnosis not present

## 2019-02-22 MED ORDER — FENTANYL 12 MCG/HR TD PT72: 1.0000 | MEDICATED_PATCH | TRANSDERMAL | 0 refills | Status: DC

## 2019-02-22 NOTE — Telephone Encounter (Signed)
Pt scheduled for July 15 th at 10:30 at the [facility]. Advised that this is a drive thru testing site, and will need to stay in car with mask on and windows rolled up until time for testing. She voiced understanding.

## 2019-02-22 NOTE — Progress Notes (Signed)
[facility] [place], [place] [address]  Internal MEDICINE  Telephone Visit  Patient Name: Stacy Pineda  [id number]  [id number]  Date of Service: 02/23/2019  I connected with the patient at 4:52pm by webcam and verified the patients identity using two identifiers.   I discussed the limitations, risks, security and privacy concerns of performing an evaluation and management service by webcam visit and the availability of in person appointments. I also discussed with the patient that there may be a patient responsible charge related to the service.  The patient expressed understanding and agrees to proceed.    Chief Complaint  Patient presents with  . Telephone Screen    VIDEO VISIT [phone]  . Telephone Assessment    PT requesting COVID TESTING  . Pain    abdominal pain and headache, been going on fsince last visit, pt have taken all the medication prescribed last visit and not any better,   . Headache  . Shortness of Breath  . Diarrhea  . Fever    pt have been running low grade fever, been roughly around 99.8, pt has not been around anyone sick she have traveled to the local [employer]    The patient has been contacted via webcam for follow up visit due to concerns for spread of novel coronavirus. The patient states that she continues to have abdominal pain with nausea and vomiting. Still having episodes of diarrhea. Sometimes, this is "explosive" in nature. She states that nausea has not improved. Still gets so severe that she is unable to keep even fluids down. Was given prescription for ODT Zofran. Does help some. Will help her stop vomiting, however, she will continue to be nauseated.  She also continues to have severe headache. Prescription for fioricet has not helped at all. S She knows that she is at higher risk for developing complications if she contracts the virus. She states that she also needs to have refills for her fentanyl patch, lyrica, and  medication to alleviate headache.       Current Medication: Outpatient Encounter Medications as of 02/22/2019  Medication Sig Note  . albuterol (VENTOLIN HFA) 108 (90 Base) MCG/ACT inhaler Inhale 2 puffs into the lungs every 6 (six) hours as needed for wheezing or shortness of breath.   Marland Kitchen amitriptyline (ELAVIL) 25 MG tablet Take 1 tablet (25 mg total) by mouth at bedtime for 30 days.   Marland Kitchen ASMANEX HFA 100 MCG/ACT AERO  06/28/2015: Received from: External Pharmacy  . clonazePAM (KLONOPIN) 1 MG tablet Take 1 mg by mouth 2 (two) times daily.   . fentaNYL (DURAGESIC) 12 MCG/HR Place 1 patch onto the skin every 3 (three) days.   . hydrOXYzine (ATARAX/VISTARIL) 10 MG tablet TAKE 1 TO 2 TABLETS BY MOUTH DAILY FOR BREAKTHOUGH ANXIETY.   . medroxyPROGESTERone (DEPO-PROVERA) 150 MG/ML injection Inject 1 mL (150 mg total) into the muscle every 3 (three) months.   . mirtazapine (REMERON) 45 MG tablet Take 1 tablet (45 mg total) by mouth at bedtime.   . Multiple Vitamins-Minerals (MULTIVITAMIN ADULT PO) Take 1 tablet by mouth.   . ondansetron (ZOFRAN-ODT) 8 MG disintegrating tablet Take 1 tablet (8 mg total) by mouth every 8 (eight) hours as needed for nausea or vomiting.   . prazosin (MINIPRESS) 5 MG capsule TAKE 1 CAPSULE BY MOUTH NIGHTLY   . pregabalin (LYRICA) 50 MG capsule Take 1 capsule (50 mg total) by mouth 3 (three) times daily.   . promethazine (PHENERGAN) 6.25 MG/5ML  syrup TAKE  10 ML BY MOUTH EVERY 8 HOURS AS NEEDED FOR NAUSEA AND VOMITING OR  REFRACTORY  NAUSEA/VOMITING   . venlafaxine XR (EFFEXOR-XR) 150 MG 24 hr capsule TAKE 2 CAPSULES BY MOUTH IN THE MORNING   . [DISCONTINUED] fentaNYL (DURAGESIC) 12 MCG/HR Place 1 patch onto the skin every 3 (three) days.   . butalbital-acetaminophen-caffeine (FIORICET) 50-325-40 MG tablet Take 1-2 tablets by mouth every 6 (six) hours as needed for headache. (Patient not taking: Reported on 02/22/2019)    No facility-administered encounter medications on  file as of 02/22/2019.     Surgical History: Past Surgical History:  Procedure Laterality Date  . ABDOMINAL SURGERY    . CHOLECYSTECTOMY    . COLONOSCOPY WITH PROPOFOL N/A 09/08/2018   Procedure: COLONOSCOPY WITH PROPOFOL;  Surgeon: Virgel Manifold, MD;  Location: [facility] ENDOSCOPY;  Service: Endoscopy;  Laterality: N/A;  . DECUBITUS ULCER EXCISION    . DILATION AND CURETTAGE OF UTERUS    . ESOPHAGOGASTRODUODENOSCOPY (EGD) WITH PROPOFOL N/A 09/08/2018   Procedure: ESOPHAGOGASTRODUODENOSCOPY (EGD) WITH PROPOFOL;  Surgeon: Virgel Manifold, MD;  Location: [facility] ENDOSCOPY;  Service: Endoscopy;  Laterality: N/A;  . GASTRIC BYPASS    . GASTRIC BYPASS    . REVISION GASTRIC RESTRICTIVE PROCEDURE FOR MORBID OBESITY      Medical History: Past Medical History:  Diagnosis Date  . Anxiety   . Asthma   . Bilateral ovarian cysts   . Chronic headaches   . Depression   . Pancreatitis   . Pancreatitis   . PTSD (post-traumatic stress disorder)     Family History: Family History  Problem Relation Age of Onset  . Graves' disease Mother   . Hypertension Mother   . Hyperlipidemia Mother   . Anxiety disorder Father   . Prostate cancer Father   . Depression Brother   . Anxiety disorder Brother   . Anxiety disorder Maternal Aunt   . Depression Maternal Aunt   . Anxiety disorder Paternal Aunt   . Depression Paternal Aunt   . Anxiety disorder Maternal Uncle   . Depression Maternal Uncle   . Anxiety disorder Paternal Uncle   . Depression Paternal Uncle   . Anxiety disorder Maternal Grandfather   . Depression Maternal Grandfather   . Diabetes Maternal Grandfather   . Anxiety disorder Maternal Grandmother   . Depression Maternal Grandmother   . Breast cancer Maternal Grandmother   . Ovarian cancer Maternal Grandmother   . Diabetes Maternal Grandmother   . Colon cancer Neg Hx     Social History   Socioeconomic History  . Marital status: Single    Spouse name: Not on file  .  Number of children: Not on file  . Years of education: Not on file  . Highest education level: Not on file  Occupational History  . Not on file  Social Needs  . Financial resource strain: Not on file  . Food insecurity    Worry: Not on file    Inability: Not on file  . Transportation needs    Medical: Not on file    Non-medical: Not on file  Tobacco Use  . Smoking status: Current Some Day Smoker    Packs/day: 0.25    Types: Cigars, E-cigarettes, Cigarettes    Start date: 03/26/2005  . Smokeless tobacco: Never Used  . Tobacco comment: pt smoke weekly  Substance and Sexual Activity  . Alcohol use: No    Alcohol/week: 0.0 standard drinks  . Drug use: Yes  Frequency: 3.0 times per week    Types: Marijuana    Comment: MJ use weekly  . Sexual activity: Not Currently    Birth control/protection: Injection  Lifestyle  . Physical activity    Days per week: Not on file    Minutes per session: Not on file  . Stress: Not on file  Relationships  . Social Herbalist on phone: Not on file    Gets together: Not on file    Attends religious service: Not on file    Active member of club or organization: Not on file    Attends meetings of clubs or organizations: Not on file    Relationship status: Not on file  . Intimate partner violence    Fear of current or ex partner: Not on file    Emotionally abused: Not on file    Physically abused: Not on file    Forced sexual activity: Not on file  Other Topics Concern  . Not on file  Social History Narrative  . Not on file      Review of Systems  Constitutional: Positive for activity change, chills, fatigue and fever. Negative for unexpected weight change.  HENT: Negative for congestion, postnasal drip, rhinorrhea, sinus pressure, sinus pain, sneezing and sore throat.   Respiratory: Negative for cough, chest tightness, shortness of breath and wheezing.   Cardiovascular: Negative for chest pain and palpitations.   Gastrointestinal: Positive for abdominal pain, diarrhea, nausea and vomiting. Negative for constipation.  Endocrine: Negative for cold intolerance, heat intolerance, polydipsia and polyuria.  Musculoskeletal: Positive for arthralgias and myalgias. Negative for back pain, joint swelling and neck pain.  Skin: Negative for rash.  Allergic/Immunologic: Positive for environmental allergies.  Neurological: Positive for headaches. Negative for tremors and numbness.  Hematological: Negative for adenopathy. Does not bruise/bleed easily.  Psychiatric/Behavioral: Positive for dysphoric mood. Negative for behavioral problems and sleep disturbance. The patient is not nervous/anxious.     Today's Vitals   02/22/19 1629  Weight: 157 lb (71.2 kg)  Height: 6\' 1"  (1.854 m)   Body mass index is 20.71 kg/m.  Observation/Objective:   The patient is alert and oriented. She is pleasant and answers all questions appropriately. Breathing is non-labored. She is in no acute distress at this time. The patient appears fatiged, dehydrated, and mild to moderately ill.    Assessment/Plan: 1. Chronic abdominal pain Renew fentanyl 71mcg/hour patch. Use as directed. Will get labs, including CBC and stool studies for further evaluation.  - fentaNYL (DURAGESIC) 12 MCG/HR; Place 1 patch onto the skin every 3 (three) days.  Dispense: 10 patch; Refill: 0 - Cdiff NAA+O+P+Stool Culture - CBC with Differential/Platelet; Future  2. Diarrhea, unspecified type  Labs ordered including stool studies and thyroid panel for further evaluation. Patient also to be set up for testing of COVID 19 - Cdiff NAA+O+P+Stool Culture - TSH; Future - T4, free; Future  3. Fatigue, unspecified type Labs ordered for further evaluation.  - CBC with Differential/Platelet; Future - Comprehensive metabolic panel; Future - T4, free; Future - Iron, TIBC and Ferritin Panel; Future - Vitamin B12; Future - Folate; Future  4. Dehydration Check  labs and treat as indicated.  - CBC with Differential/Platelet; Future - Comprehensive metabolic panel; Future  5. B12 deficiency - Iron, TIBC and Ferritin Panel; Future - Vitamin B12; Future - Folate; Future  6. Vitamin D deficiency - Vitamin D 1,25 dihydroxy; Future  7. Essential (primary) hypertension stable - Lipid panel; Future - TSH;  Future  General Counseling: Stacy Pineda understanding of the findings of today's phone visit and agrees with plan of treatment. I have discussed any further diagnostic evaluation that may be needed or ordered today. We also reviewed her medications today. she has been encouraged to call the office with any questions or concerns that should arise related to todays visit.  A high fiber diet with plenty of fluids (up to 8 glasses of water daily) is suggested to relieve these symptoms.  Metamucil, 1 tablespoon once or twice daily can be used to keep bowels regular if needed.  Rest and increase fluids. Continue using OTC medication to control symptoms.   This patient was seen by Leretha Pol FNP Collaboration with Dr Lavera Guise as a part of collaborative care agreement  Orders Placed This Encounter  Procedures  . Cdiff NAA+O+P+Stool Culture  . CBC with Differential/Platelet  . Comprehensive metabolic panel  . Lipid panel  . TSH  . T4, free  . Vitamin D 1,25 dihydroxy  . Iron, TIBC and Ferritin Panel  . Vitamin B12  . Folate    Meds ordered this encounter  Medications  . fentaNYL (DURAGESIC) 12 MCG/HR    Sig: Place 1 patch onto the skin every 3 (three) days.    Dispense:  10 patch    Refill:  0    Confirmed with patient she gets itching with fentanyl, but does not have true allergy to medication.    Order Specific Question:   Supervising Provider    Answer:   Lavera Guise [[id number]]    Time spent: 59 Minutes    Dr Lavera Guise Internal medicine

## 2019-02-22 NOTE — Telephone Encounter (Signed)
-----   Message from Corlis Hove sent at 02/22/2019  5:10 PM EDT ----- Please call pt and schedule  for covid 19 test

## 2019-02-23 ENCOUNTER — Other Ambulatory Visit: Payer: Self-pay

## 2019-02-23 DIAGNOSIS — E860 Dehydration: Secondary | ICD-10-CM | POA: Insufficient documentation

## 2019-02-23 DIAGNOSIS — Z20822 Contact with and (suspected) exposure to covid-19: Secondary | ICD-10-CM

## 2019-02-23 DIAGNOSIS — R531 Weakness: Secondary | ICD-10-CM | POA: Insufficient documentation

## 2019-02-23 DIAGNOSIS — E538 Deficiency of other specified B group vitamins: Secondary | ICD-10-CM | POA: Insufficient documentation

## 2019-02-23 DIAGNOSIS — R6889 Other general symptoms and signs: Secondary | ICD-10-CM | POA: Diagnosis not present

## 2019-02-23 DIAGNOSIS — E559 Vitamin D deficiency, unspecified: Secondary | ICD-10-CM | POA: Insufficient documentation

## 2019-02-24 DIAGNOSIS — R197 Diarrhea, unspecified: Secondary | ICD-10-CM | POA: Diagnosis not present

## 2019-02-24 DIAGNOSIS — E559 Vitamin D deficiency, unspecified: Secondary | ICD-10-CM | POA: Diagnosis not present

## 2019-02-24 DIAGNOSIS — E538 Deficiency of other specified B group vitamins: Secondary | ICD-10-CM | POA: Diagnosis not present

## 2019-02-24 DIAGNOSIS — E611 Iron deficiency: Secondary | ICD-10-CM | POA: Diagnosis not present

## 2019-02-24 DIAGNOSIS — D509 Iron deficiency anemia, unspecified: Secondary | ICD-10-CM | POA: Diagnosis not present

## 2019-02-24 DIAGNOSIS — E860 Dehydration: Secondary | ICD-10-CM | POA: Diagnosis not present

## 2019-02-24 DIAGNOSIS — G8929 Other chronic pain: Secondary | ICD-10-CM | POA: Diagnosis not present

## 2019-02-24 DIAGNOSIS — R5383 Other fatigue: Secondary | ICD-10-CM | POA: Diagnosis not present

## 2019-02-24 DIAGNOSIS — I10 Essential (primary) hypertension: Secondary | ICD-10-CM | POA: Diagnosis not present

## 2019-02-24 DIAGNOSIS — R790 Abnormal level of blood mineral: Secondary | ICD-10-CM | POA: Diagnosis not present

## 2019-02-24 DIAGNOSIS — R109 Unspecified abdominal pain: Secondary | ICD-10-CM | POA: Diagnosis not present

## 2019-02-27 LAB — NOVEL CORONAVIRUS, NAA: SARS-CoV-2, NAA: NOT DETECTED

## 2019-03-01 LAB — COMPREHENSIVE METABOLIC PANEL
ALT: 72 IU/L — ABNORMAL HIGH (ref 0–32)
AST: 34 IU/L (ref 0–40)
Albumin/Globulin Ratio: 1.9 (ref 1.2–2.2)
Albumin: 4.4 g/dL (ref 3.8–4.8)
Alkaline Phosphatase: 295 IU/L — ABNORMAL HIGH (ref 39–117)
BUN/Creatinine Ratio: 17 (ref 9–23)
BUN: 8 mg/dL (ref 6–24)
Bilirubin Total: 0.3 mg/dL (ref 0.0–1.2)
CO2: 18 mmol/L — ABNORMAL LOW (ref 20–29)
Calcium: 9 mg/dL (ref 8.7–10.2)
Chloride: 110 mmol/L — ABNORMAL HIGH (ref 96–106)
Creatinine, Ser: 0.47 mg/dL — ABNORMAL LOW (ref 0.57–1.00)
GFR calc Af Amer: 143 mL/min/{1.73_m2} (ref >59)
GFR calc non Af Amer: 124 mL/min/{1.73_m2} (ref >59)
Globulin, Total: 2.3 g/dL (ref 1.5–4.5)
Glucose: 98 mg/dL (ref 65–99)
Potassium: 3.2 mmol/L — ABNORMAL LOW (ref 3.5–5.2)
Sodium: 139 mmol/L (ref 134–144)
Total Protein: 6.7 g/dL (ref 6.0–8.5)

## 2019-03-01 LAB — VITAMIN D 1,25 DIHYDROXY
Vitamin D 1, 25 (OH)2 Total: 68 pg/mL — ABNORMAL HIGH
Vitamin D2 1, 25 (OH)2: <10 pg/mL
Vitamin D3 1, 25 (OH)2: 66 pg/mL

## 2019-03-01 LAB — IRON,TIBC AND FERRITIN PANEL
Ferritin: 47 ng/mL (ref 15–150)
Iron Saturation: 23 % (ref 15–55)
Iron: 67 ug/dL (ref 27–159)
Total Iron Binding Capacity: 297 ug/dL (ref 250–450)
UIBC: 230 ug/dL (ref 131–425)

## 2019-03-01 LAB — FOLATE: Folate: 3.5 ng/mL (ref >3.0)

## 2019-03-01 LAB — LIPID PANEL
Chol/HDL Ratio: 2.6 ratio (ref 0.0–4.4)
Cholesterol, Total: 146 mg/dL (ref 100–199)
HDL: 56 mg/dL (ref >39)
LDL Calculated: 66 mg/dL (ref 0–99)
Triglycerides: 118 mg/dL (ref 0–149)
VLDL Cholesterol Cal: 24 mg/dL (ref 5–40)

## 2019-03-01 LAB — CBC WITH DIFFERENTIAL/PLATELET
Basophils Absolute: 0 10*3/uL (ref 0.0–0.2)
Basos: 0 %
EOS (ABSOLUTE): 0.2 10*3/uL (ref 0.0–0.4)
Eos: 2 %
Hematocrit: 34.5 % (ref 34.0–46.6)
Hemoglobin: 12.3 g/dL (ref 11.1–15.9)
Lymphocytes Absolute: 3 10*3/uL (ref 0.7–3.1)
Lymphs: 31 %
MCH: 25.4 pg — ABNORMAL LOW (ref 26.6–33.0)
MCHC: 35.7 g/dL (ref 31.5–35.7)
MCV: 71 fL — ABNORMAL LOW (ref 79–97)
Monocytes Absolute: 0.5 10*3/uL (ref 0.1–0.9)
Monocytes: 5 %
Neutrophils Absolute: 6 10*3/uL (ref 1.4–7.0)
Neutrophils: 62 %
Platelets: 346 10*3/uL (ref 150–450)
RBC: 4.84 x10E6/uL (ref 3.77–5.28)
RDW: 18.6 % — ABNORMAL HIGH (ref 11.7–15.4)
WBC: 9.7 10*3/uL (ref 3.4–10.8)

## 2019-03-01 LAB — TSH: TSH: 1.48 u[IU]/mL (ref 0.450–4.500)

## 2019-03-01 LAB — VITAMIN B12: Vitamin B-12: 280 pg/mL (ref 232–1245)

## 2019-03-01 LAB — T4, FREE: Free T4: 0.74 ng/dL — ABNORMAL LOW (ref 0.82–1.77)

## 2019-03-02 ENCOUNTER — Other Ambulatory Visit: Payer: Self-pay | Admitting: Nurse Practitioner

## 2019-03-02 ENCOUNTER — Telehealth: Payer: Self-pay

## 2019-03-02 DIAGNOSIS — E039 Hypothyroidism, unspecified: Secondary | ICD-10-CM

## 2019-03-02 DIAGNOSIS — E876 Hypokalemia: Secondary | ICD-10-CM

## 2019-03-02 MED ORDER — POTASSIUM CHLORIDE ER 10 MEQ PO TBCR: 10.0000 meq | EXTENDED_RELEASE_TABLET | Freq: Every day | ORAL | 3 refills | Status: DC

## 2019-03-02 MED ORDER — LEVOTHYROXINE SODIUM 25 MCG PO TABS: 25.0000 ug | ORAL_TABLET | Freq: Every day | ORAL | 3 refills | Status: DC

## 2019-03-02 NOTE — Progress Notes (Signed)
Review of labs shows hypothyroid. I have started levothyroxine 38mcg daily which should be taken daily on empty stomach. Potassium also a bit low. I have potassium supplement daily. Both sent to pharmacy. Will have to recheck these labs in about 2 months

## 2019-03-02 NOTE — Progress Notes (Signed)
Please let the patient know that Review of labs shows hypothyroid. I have started levothyroxine 53mcg daily which should be taken daily on empty stomach. Potassium also a bit low. I have potassium supplement daily. Both sent to pharmacy. Will have to recheck these labs in about 2 months  thanks

## 2019-03-02 NOTE — Telephone Encounter (Signed)
Coronavirus (COVID-19) Are you at risk?  Are you at risk for the Coronavirus (COVID-19)?  To be considered HIGH RISK for Coronavirus (COVID-19), you have to meet the following criteria:  . Traveled to [place], [place], [place], [place] or [place]; or in the [place] to [place], [place], [place], or [place]; and have fever, cough, and shortness of breath within the last 2 weeks of travel OR . Been in close contact with a person diagnosed with COVID-19 within the last 2 weeks and have fever, cough, and shortness of breath . IF YOU DO NOT MEET THESE CRITERIA, YOU ARE CONSIDERED LOW RISK FOR COVID-19.  What to do if you are HIGH RISK for COVID-19?  Marland Kitchen If you are having a medical emergency, call 911. . Seek medical care right away. Before you go to a doctor's office, urgent care or emergency department, call ahead and tell them about your recent travel, contact with someone diagnosed with COVID-19, and your symptoms. You should receive instructions from your physician's office regarding next steps of care.  . When you arrive at healthcare provider, tell the healthcare staff immediately you have returned from visiting [place], [place], [place], [place] or [place]; or traveled in the [place] to [place], [place], [place], or [place]; in the last two weeks or you have been in close contact with a person diagnosed with COVID-19 in the last 2 weeks.   . Tell the health care staff about your symptoms: fever, cough and shortness of breath. . After you have been seen by a medical provider, you will be either: o Tested for (COVID-19) and discharged home on quarantine except to seek medical care if symptoms worsen, and asked to  - Stay home and avoid contact with others until you get your results (4-5 days)  - Avoid travel on public transportation if possible (such as bus, train, or airplane) or o Sent to the Emergency Department by EMS for evaluation, COVID-19 testing, and possible  admission depending on your condition and test results.  What to do if you are LOW RISK for COVID-19?  Reduce your risk of any infection by using the same precautions used for avoiding the common cold or flu:  Marland Kitchen Wash your hands often with soap and warm water for at least 20 seconds.  If soap and water are not readily available, use an alcohol-based hand sanitizer with at least 60% alcohol.  . If coughing or sneezing, cover your mouth and nose by coughing or sneezing into the elbow areas of your shirt or coat, into a tissue or into your sleeve (not your hands). . Avoid shaking hands with others and consider head nods or verbal greetings only. . Avoid touching your eyes, nose, or mouth with unwashed hands.  . Avoid close contact with people who are Gionna Polak. . Avoid places or events with large numbers of people in one location, like concerts or sporting events. . Carefully consider travel plans you have or are making. . If you are planning any travel outside or inside the [place], visit the CDC's Travelers' Health webpage for the latest health notices. . If you have some symptoms but not all symptoms, continue to monitor at home and seek medical attention if your symptoms worsen. . If you are having a medical emergency, call 911.  03/02/19 SCREENING NEG SLS ADDITIONAL HEALTHCARE OPTIONS FOR PATIENTS  [facility] / e-Visit: [email or web address]         [facility]: [phone]  [facility]: [phone]                   [facility]: [phone]  

## 2019-03-03 ENCOUNTER — Encounter: Payer: Self-pay | Admitting: Certified Nurse Midwife

## 2019-03-03 ENCOUNTER — Ambulatory Visit (INDEPENDENT_AMBULATORY_CARE_PROVIDER_SITE_OTHER): Payer: Medicare Other | Admitting: Certified Nurse Midwife

## 2019-03-03 ENCOUNTER — Telehealth: Payer: Self-pay

## 2019-03-03 ENCOUNTER — Other Ambulatory Visit: Payer: Self-pay

## 2019-03-03 VITALS — BP 115/88 | HR 105 | Ht 73.0 in | Wt 153.3 lb

## 2019-03-03 DIAGNOSIS — Z8742 Personal history of other diseases of the female genital tract: Secondary | ICD-10-CM | POA: Diagnosis not present

## 2019-03-03 DIAGNOSIS — R197 Diarrhea, unspecified: Secondary | ICD-10-CM | POA: Diagnosis not present

## 2019-03-03 DIAGNOSIS — R109 Unspecified abdominal pain: Secondary | ICD-10-CM | POA: Diagnosis not present

## 2019-03-03 DIAGNOSIS — G8929 Other chronic pain: Secondary | ICD-10-CM | POA: Diagnosis not present

## 2019-03-03 MED ORDER — HYDROCODONE-ACETAMINOPHEN 5-325 MG PO TABS: 1.0000 | ORAL_TABLET | Freq: Three times a day (TID) | ORAL | 0 refills | Status: AC | PRN

## 2019-03-03 MED ORDER — HYDROCODONE-ACETAMINOPHEN 5-325 MG PO TABS: 1.0000 | ORAL_TABLET | Freq: Three times a day (TID) | ORAL | 0 refills | Status: DC | PRN

## 2019-03-03 NOTE — Progress Notes (Signed)
GYN ENCOUNTER NOTE  Subjective:       Stacy Pineda is a 38 y.o. [id number] female is here for gynecologic evaluation of the following issues:  1. Pain related to ovarian cysts  Reports one (1) to two (2) episodes of intermittent left sided pain daily. Pain not relieved by home treatment measures. Has used Vicodin in the past, no severe allergy.   Denies difficulty breathing or respiratory distress, chest pain, abdominal pain, excessive vaginal bleeding, dysuria, and leg pain or swelling.    Gynecologic History  No LMP recorded. Patient has had an injection.  Contraception: Depo-Provera injections  Last Pap: 2016. Results were: Negative/Negative  Last mammogram: due.   Obstetric History OB History  Gravida Para Term Preterm AB Living  1       1    SAB TAB Ectopic Multiple Live Births  1            # Outcome Date GA Lbr Len/2nd Weight Sex Delivery Anes PTL Lv  1 SAB 1999            Past Medical History:  Diagnosis Date  . Anxiety   . Asthma   . Bilateral ovarian cysts   . Chronic headaches   . Depression   . Hypothyroidism   . Pancreatitis   . Pancreatitis   . PTSD (post-traumatic stress disorder)     Past Surgical History:  Procedure Laterality Date  . ABDOMINAL SURGERY    . CHOLECYSTECTOMY    . COLONOSCOPY WITH PROPOFOL N/A 09/08/2018   Procedure: COLONOSCOPY WITH PROPOFOL;  Surgeon: Virgel Manifold, MD;  Location: [facility] ENDOSCOPY;  Service: Endoscopy;  Laterality: N/A;  . DECUBITUS ULCER EXCISION    . DILATION AND CURETTAGE OF UTERUS    . ESOPHAGOGASTRODUODENOSCOPY (EGD) WITH PROPOFOL N/A 09/08/2018   Procedure: ESOPHAGOGASTRODUODENOSCOPY (EGD) WITH PROPOFOL;  Surgeon: Virgel Manifold, MD;  Location: [facility] ENDOSCOPY;  Service: Endoscopy;  Laterality: N/A;  . GASTRIC BYPASS    . GASTRIC BYPASS    . REVISION GASTRIC RESTRICTIVE PROCEDURE FOR MORBID OBESITY      Current Outpatient Medications on File Prior to Visit  Medication Sig Dispense Refill  .  albuterol (VENTOLIN HFA) 108 (90 Base) MCG/ACT inhaler Inhale 2 puffs into the lungs every 6 (six) hours as needed for wheezing or shortness of breath. 1 Inhaler 3  . clonazePAM (KLONOPIN) 1 MG tablet Take 1 mg by mouth 2 (two) times daily.    . fentaNYL (DURAGESIC) 12 MCG/HR Place 1 patch onto the skin every 3 (three) days. 10 patch 0  . hydrOXYzine (ATARAX/VISTARIL) 10 MG tablet TAKE 1 TO 2 TABLETS BY MOUTH DAILY FOR BREAKTHOUGH ANXIETY.    Marland Kitchen levothyroxine (SYNTHROID) 25 MCG tablet Take 1 tablet (25 mcg total) by mouth daily before breakfast. 30 tablet 3  . medroxyPROGESTERone (DEPO-PROVERA) 150 MG/ML injection Inject 1 mL (150 mg total) into the muscle every 3 (three) months. 1 mL 2  . mirtazapine (REMERON) 45 MG tablet Take 1 tablet (45 mg total) by mouth at bedtime. 90 tablet 0  . Multiple Vitamins-Minerals (MULTIVITAMIN ADULT PO) Take 1 tablet by mouth.    . ondansetron (ZOFRAN-ODT) 8 MG disintegrating tablet Take 1 tablet (8 mg total) by mouth every 8 (eight) hours as needed for nausea or vomiting. 30 tablet 1  . potassium chloride (K-DUR) 10 MEQ tablet Take 1 tablet (10 mEq total) by mouth daily. 30 tablet 3  . prazosin (MINIPRESS) 5 MG capsule TAKE 1 CAPSULE BY MOUTH NIGHTLY    .  pregabalin (LYRICA) 50 MG capsule Take 1 capsule (50 mg total) by mouth 3 (three) times daily. 90 capsule 3  . promethazine (PHENERGAN) 6.25 MG/5ML syrup TAKE  10 ML BY MOUTH EVERY 8 HOURS AS NEEDED FOR NAUSEA AND VOMITING OR  REFRACTORY  NAUSEA/VOMITING 240 mL 0  . venlafaxine XR (EFFEXOR-XR) 150 MG 24 hr capsule TAKE 2 CAPSULES BY MOUTH IN THE MORNING    . ASMANEX HFA 100 MCG/ACT AERO      No current facility-administered medications on file prior to visit.     Allergies  Allergen Reactions  . Fentanyl Itching  . Morphine And Related Itching    Itchy.   . Vicodin [Hydrocodone-Acetaminophen] Itching    Social History   Socioeconomic History  . Marital status: Single    Spouse name: Not on file  .  Number of children: Not on file  . Years of education: Not on file  . Highest education level: Not on file  Occupational History  . Not on file  Social Needs  . Financial resource strain: Not on file  . Food insecurity    Worry: Not on file    Inability: Not on file  . Transportation needs    Medical: Not on file    Non-medical: Not on file  Tobacco Use  . Smoking status: Current Some Day Smoker    Packs/day: 0.25    Types: Cigars, E-cigarettes, Cigarettes    Start date: 03/26/2005  . Smokeless tobacco: Never Used  Substance and Sexual Activity  . Alcohol use: No    Alcohol/week: 0.0 standard drinks  . Drug use: Yes    Frequency: 3.0 times per week    Types: Marijuana    Comment: MJ use weekly  . Sexual activity: Not Currently    Birth control/protection: Injection  Lifestyle  . Physical activity    Days per week: Not on file    Minutes per session: Not on file  . Stress: Not on file  Relationships  . Social Herbalist on phone: Not on file    Gets together: Not on file    Attends religious service: Not on file    Active member of club or organization: Not on file    Attends meetings of clubs or organizations: Not on file    Relationship status: Not on file  . Intimate partner violence    Fear of current or ex partner: Not on file    Emotionally abused: Not on file    Physically abused: Not on file    Forced sexual activity: Not on file  Other Topics Concern  . Not on file  Social History Narrative  . Not on file    Family History  Problem Relation Age of Onset  . Graves' disease Mother   . Hypertension Mother   . Hyperlipidemia Mother   . Anxiety disorder Father   . Prostate cancer Father   . Depression Brother   . Anxiety disorder Brother   . Anxiety disorder Maternal Aunt   . Depression Maternal Aunt   . Anxiety disorder Paternal Aunt   . Depression Paternal Aunt   . Anxiety disorder Maternal Uncle   . Depression Maternal Uncle   .  Anxiety disorder Paternal Uncle   . Depression Paternal Uncle   . Anxiety disorder Maternal Grandfather   . Depression Maternal Grandfather   . Diabetes Maternal Grandfather   . Anxiety disorder Maternal Grandmother   . Depression Maternal Grandmother   .  Breast cancer Maternal Grandmother   . Ovarian cancer Maternal Grandmother   . Diabetes Maternal Grandmother   . Colon cancer Neg Hx     The following portions of the patient's history were reviewed and updated as appropriate: allergies, current medications, past family history, past medical history, past social history, past surgical history and problem list.  Review of Systems  ROS negative except as noted above. Information obtained from patient.   Objective:   BP 115/88   Pulse (!) 105   Ht 6\' 1"  (1.854 m)   Wt 153 lb 4.8 oz (69.5 kg)   BMI 20.23 kg/m    CONSTITUTIONAL: Well-developed, well-nourished female in no acute distress.   ULTRASOUND REPORT  Location: [facility]  Date of Service: 02/03/2019     Indications:Pelvic Pain Findings:  The uterus is anteverted and measures 7.3 x 3.7 x 4.3 cm. Echo texture is homogenous without evidence of focal masses.  The Endometrium measures 2 mm.  Right Ovary measures 3.2 x 2.2 x 2.6 cm. It is normal in appearance.Hypoechoic cystic lesion measuring 1.3 cm. Left Ovary measures 3.6 x 2.4 x 2.9 cm. It is normal in appearance.Hypoechoic cystic lesion measuring 2.0 cm. Survey of the adnexa demonstrates no adnexal masses. There is no free fluid in the cul de sac.  Impression: 1. Bilateral small ovarian cysts.  Recommendations: 1.Clinical correlation with the patient's History and Physical Exam.  Assessment:   1. History of ovarian cyst  Plan:   Ultrasound findings reviewed with patient, verbalized understanding.   Questions removal of ovaries as treatment. Education regarding need for hormone replacement long term.   Rx: Vicodin, see orders. No refills  will be provided.   RTC x 1-2 weeks for ultrasound and results review. Will follow up with MD.    Diona Fanti, CNM [facility], [facility] 03/03/19 4:24 PM

## 2019-03-03 NOTE — Patient Instructions (Addendum)
Ovarian Cyst     An ovarian cyst is a fluid-filled sac that forms on an ovary. The ovaries are small organs that produce eggs in women. Various types of cysts can form on the ovaries. Some may cause symptoms and require treatment. Most ovarian cysts go away on their own, are not cancerous (are benign), and do not cause problems. Common types of ovarian cysts include:  Functional (follicle) cysts. ? Occur during the menstrual cycle, and usually go away with the next menstrual cycle if you do not get pregnant. ? Usually cause no symptoms.  Endometriomas. ? Are cysts that form from the tissue that lines the uterus (endometrium). ? Are sometimes called "chocolate cysts" because they become filled with blood that turns brown. ? Can cause pain in the lower abdomen during intercourse and during your period.  Cystadenoma cysts. ? Develop from cells on the outside surface of the ovary. ? Can get very large and cause lower abdomen pain and pain with intercourse. ? Can cause severe pain if they twist or break open (rupture).  Dermoid cysts. ? Are sometimes found in both ovaries. ? May contain different kinds of body tissue, such as skin, teeth, hair, or cartilage. ? Usually do not cause symptoms unless they get very big.  Theca lutein cysts. ? Occur when too much of a certain hormone (human chorionic gonadotropin) is produced and overstimulates the ovaries to produce an egg. ? Are most common after having procedures used to assist with the conception of a baby (in vitro fertilization). What are the causes? Ovarian cysts may be caused by:  Ovarian hyperstimulation syndrome. This is a condition that can develop from taking fertility medicines. It causes multiple large ovarian cysts to form.  Polycystic ovarian syndrome (PCOS). This is a common hormonal disorder that can cause ovarian cysts, as well as problems with your period or fertility. What increases the risk? The following factors may  make you more likely to develop ovarian cysts:  Being overweight or obese.  Taking fertility medicines.  Taking certain forms of hormonal birth control.  Smoking. What are the signs or symptoms? Many ovarian cysts do not cause symptoms. If symptoms are present, they may include:  Pelvic pain or pressure.  Pain in the lower abdomen.  Pain during sex.  Abdominal swelling.  Abnormal menstrual periods.  Increasing pain with menstrual periods. How is this diagnosed? These cysts are commonly found during a routine pelvic exam. You may have tests to find out more about the cyst, such as:  Ultrasound.  X-ray of the pelvis.  CT scan.  MRI.  Blood tests. How is this treated? Many ovarian cysts go away on their own without treatment. Your health care provider may want to check your cyst regularly for 2-3 months to see if it changes. If you are in menopause, it is especially important to have your cyst monitored closely because menopausal women have a higher rate of ovarian cancer. When treatment is needed, it may include:  Medicines to help relieve pain.  A procedure to drain the cyst (aspiration).  Surgery to remove the whole cyst.  Hormone treatment or birth control pills. These methods are sometimes used to help dissolve a cyst. Follow these instructions at home:  Take over-the-counter and prescription medicines only as told by your health care provider.  Do not drive or use heavy machinery while taking prescription pain medicine.  Get regular pelvic exams and Pap tests as often as told by your health care provider.    Return to your normal activities as told by your health care provider. Ask your health care provider what activities are safe for you.  Do not use any products that contain nicotine or tobacco, such as cigarettes and e-cigarettes. If you need help quitting, ask your health care provider.  Keep all follow-up visits as told by your health care provider.  This is important. Contact a health care provider if:  Your periods are late, irregular, or painful, or they stop.  You have pelvic pain that does not go away.  You have pressure on your bladder or trouble emptying your bladder completely.  You have pain during sex.  You have any of the following in your abdomen: ? A feeling of fullness. ? Pressure. ? Discomfort. ? Pain that does not go away. ? Swelling.  You feel generally ill.  You become constipated.  You lose your appetite.  You develop severe acne.  You start to have more body hair and facial hair.  You are gaining weight or losing weight without changing your exercise and eating habits.  You think you may be pregnant. Get help right away if:  You have abdominal pain that is severe or gets worse.  You cannot eat or drink without vomiting.  You suddenly develop a fever.  Your menstrual period is much heavier than usual. This information is not intended to replace advice given to you by your health care provider. Make sure you discuss any questions you have with your health care provider. Document Released: 07/28/2005 Document Revised: 10/26/2017 Document Reviewed: 12/30/2015 Elsevier Patient Education  2020 [employer]. Oxycodone tablets or capsules What is this medicine? OXYCODONE (ox i KOE done) is a pain reliever. It is used to treat moderate to severe pain. This medicine may be used for other purposes; ask your health care provider or pharmacist if you have questions. COMMON BRAND NAME(S): Dazidox, Endocodone, Oxaydo, OXECTA, OxyIR, Percolone, Roxicodone, Roxybond What should I tell my health care provider before I take this medicine? They need to know if you have any of these conditions:  Addison's disease  brain tumor  head injury  heart disease  history of drug or alcohol abuse problem  if you often drink alcohol  kidney disease  liver disease  lung or breathing disease, like asthma   mental illness  pancreatic disease  seizures  thyroid disease  an unusual or allergic reaction to oxycodone, codeine, hydrocodone, morphine, other medicines, foods, dyes, or preservatives  pregnant or trying to get pregnant  breast-feeding How should I use this medicine? Take this medicine by mouth with a glass of water. Follow the directions on the prescription label. You can take it with or without food. If it upsets your stomach, take it with food. Take your medicine at regular intervals. Do not take it more often than directed. Do not stop taking except on your doctor's advice. Some brands of this medicine, like Oxecta, have special instructions. Ask your doctor or pharmacist if these directions are for you: Do not cut, crush or chew this medicine. Swallow only one tablet at a time. Do not wet, soak, or lick the tablet before you take it. A special MedGuide will be given to you by the pharmacist with each prescription and refill. Be sure to read this information carefully each time. Talk to your pediatrician regarding the use of this medicine in children. Special care may be needed. Overdosage: If you think you have taken too much of this medicine contact a poison  control center or emergency room at once. NOTE: This medicine is only for you. Do not share this medicine with others. What if I miss a dose? If you miss a dose, take it as soon as you can. If it is almost time for your next dose, take only that dose. Do not take double or extra doses. What may interact with this medicine? This medicine may interact with the following medications:  alcohol  antihistamines for allergy, cough and cold  antiviral medicines for HIV or AIDS  atropine  certain antibiotics like clarithromycin, erythromycin, linezolid, rifampin  certain medicines for anxiety or sleep  certain medicines for bladder problems like oxybutynin, tolterodine  certain medicines for depression like amitriptyline,  fluoxetine, sertraline  certain medicines for fungal infections like ketoconazole, itraconazole, voriconazole  certain medicines for migraine headache like almotriptan, eletriptan, frovatriptan, naratriptan, rizatriptan, sumatriptan, zolmitriptan  certain medicines for nausea or vomiting like dolasetron, ondansetron, palonosetron  certain medicines for Parkinson's disease like benztropine, trihexyphenidyl  certain medicines for seizures like phenobarbital, phenytoin, primidone  certain medicines for stomach problems like dicyclomine, hyoscyamine  certain medicines for travel sickness like scopolamine  diuretics  general anesthetics like halothane, isoflurane, methoxyflurane, propofol  ipratropium  local anesthetics like lidocaine, pramoxine, tetracaine  MAOIs like Carbex, Eldepryl, Marplan, Nardil, and Parnate  medicines that relax muscles for surgery  methylene blue  nilotinib  other narcotic medicines for pain or cough  phenothiazines like chlorpromazine, mesoridazine, prochlorperazine, thioridazine This list may not describe all possible interactions. Give your health care provider a list of all the medicines, herbs, non-prescription drugs, or dietary supplements you use. Also tell them if you smoke, drink alcohol, or use illegal drugs. Some items may interact with your medicine. What should I watch for while using this medicine? Tell your doctor or health care professional if your pain does not go away, if it gets worse, or if you have new or a different type of pain. You may develop tolerance to the medicine. Tolerance means that you will need a higher dose of the medicine for pain relief. Tolerance is normal and is expected if you take this medicine for a long time. Do not suddenly stop taking your medicine because you may develop a severe reaction. Your body becomes used to the medicine. This does NOT mean you are addicted. Addiction is a behavior related to getting and  using a drug for a non-medical reason. If you have pain, you have a medical reason to take pain medicine. Your doctor will tell you how much medicine to take. If your doctor wants you to stop the medicine, the dose will be slowly lowered over time to avoid any side effects. There are different types of narcotic medicines (opiates). If you take more than one type at the same time or if you are taking another medicine that also causes drowsiness, you may have more side effects. Give your health care provider a list of all medicines you use. Your doctor will tell you how much medicine to take. Do not take more medicine than directed. Call emergency for help if you have problems breathing or unusual sleepiness. You may get drowsy or dizzy. Do not drive, use machinery, or do anything that needs mental alertness until you know how the medicine affects you. Do not stand or sit up quickly, especially if you are an older patient. This reduces the risk of dizzy or fainting spells. Alcohol may interfere with the effect of this medicine. Avoid alcoholic drinks. This medicine  will cause constipation. Try to have a bowel movement at least every 2 to 3 days. If you do not have a bowel movement for 3 days, call your doctor or health care professional. Your mouth may get dry. Chewing sugarless gum or sucking hard candy, and drinking plenty of water may help. Contact your doctor if the problem does not go away or is severe. What side effects may I notice from receiving this medicine? Side effects that you should report to your doctor or health care professional as soon as possible:  allergic reactions like skin rash, itching or hives, swelling of the face, lips, or tongue  breathing problems  confusion  signs and symptoms of low blood pressure like dizziness; feeling faint or lightheaded, falls; unusually weak or tired  trouble passing urine or change in the amount of urine  trouble swallowing Side effects that  usually do not require medical attention (report to your doctor or health care professional if they continue or are bothersome):  constipation  dry mouth  nausea, vomiting  tiredness This list may not describe all possible side effects. Call your doctor for medical advice about side effects. You may report side effects to FDA at 1-800-FDA-1088. Where should I keep my medicine? Keep out of the reach of children. This medicine can be abused. Keep your medicine in a safe place to protect it from theft. Do not share this medicine with anyone. Selling or giving away this medicine is dangerous and against the law. Store at room temperature between 15 and 30 degrees C (59 and 86 degrees F). Protect from light. Keep container tightly closed. This medicine may cause harm and death if it is taken by other adults, children, or pets. Return medicine that has not been used to an official disposal site. Contact the DEA at (480)249-8271 or your city/county government to find a site. If you cannot return the medicine, flush it down the toilet. Do not use the medicine after the expiration date. NOTE: This sheet is a summary. It may not cover all possible information. If you have questions about this medicine, talk to your doctor, pharmacist, or health care provider.  2020 Elsevier/Gold Standard (2016-12-02 16:13:10)

## 2019-03-03 NOTE — Addendum Note (Signed)
Addended by: Cherre Huger on: 03/03/2019 04:27 PM   Modules accepted: Orders

## 2019-03-03 NOTE — Progress Notes (Signed)
Patient here to discuss 6/25 ultrasound.

## 2019-03-03 NOTE — Telephone Encounter (Signed)
Informed pt of results and new rx sent to her pharmacy

## 2019-03-14 LAB — CDIFF NAA+O+P+STOOL CULTURE: E coli, Shiga toxin Assay: NEGATIVE

## 2019-03-15 ENCOUNTER — Other Ambulatory Visit: Payer: Medicare Other

## 2019-03-15 ENCOUNTER — Encounter: Payer: Medicare Other | Admitting: Obstetrics and Gynecology

## 2019-03-17 ENCOUNTER — Encounter: Payer: Self-pay | Admitting: Obstetrics and Gynecology

## 2019-03-17 ENCOUNTER — Ambulatory Visit (INDEPENDENT_AMBULATORY_CARE_PROVIDER_SITE_OTHER): Payer: Medicare Other

## 2019-03-17 ENCOUNTER — Other Ambulatory Visit: Payer: Self-pay

## 2019-03-17 ENCOUNTER — Ambulatory Visit (INDEPENDENT_AMBULATORY_CARE_PROVIDER_SITE_OTHER): Payer: Medicare Other | Admitting: Obstetrics and Gynecology

## 2019-03-17 VITALS — BP 125/90 | HR 93 | Ht 73.0 in | Wt 155.7 lb

## 2019-03-17 DIAGNOSIS — R102 Pelvic and perineal pain: Secondary | ICD-10-CM | POA: Diagnosis not present

## 2019-03-17 DIAGNOSIS — Z01419 Encounter for gynecological examination (general) (routine) without abnormal findings: Secondary | ICD-10-CM

## 2019-03-17 DIAGNOSIS — Z87898 Personal history of other specified conditions: Secondary | ICD-10-CM | POA: Diagnosis not present

## 2019-03-17 DIAGNOSIS — N921 Excessive and frequent menstruation with irregular cycle: Secondary | ICD-10-CM

## 2019-03-17 DIAGNOSIS — Z8742 Personal history of other diseases of the female genital tract: Secondary | ICD-10-CM

## 2019-03-17 DIAGNOSIS — Z8489 Family history of other specified conditions: Secondary | ICD-10-CM

## 2019-03-17 NOTE — Progress Notes (Signed)
HPI:      Stacy Pineda is a 38 y.o. G1P0010 who LMP was No LMP recorded. Patient has had an injection.  Subjective:   She presents today left-sided pelvic pain.  She has had this for a few months now and has had negative ultrasounds showing very small ovarian cysts. Patient describes a history before Depo-Provera of very heavy menstrual bleeding but has done very well on Depo without dysmenorrhea or menstrual periods at all. She reports normal regular daily bowel movements. She denies history of adhesive disease or prior STDs. No evidence of pelvic adhesive disease. Patient does state that her pain is worse at the end of the day or after a day where she has exerted herself physically.     Hx: The following portions of the patient's history were reviewed and updated as appropriate:             She  has a past medical history of Anxiety, Asthma, Bilateral ovarian cysts, Chronic headaches, Depression, Hypothyroidism, Pancreatitis, Pancreatitis, and PTSD (post-traumatic stress disorder). She does not have any pertinent problems on file. She  has a past surgical history that includes Abdominal surgery; Cholecystectomy; Decubitus ulcer excision; Gastric bypass; Dilation and curettage of uterus; Gastric bypass; Revision gastric restrictive procedure for morbid obesity; Colonoscopy with propofol (N/A, 09/08/2018); and Esophagogastroduodenoscopy (egd) with propofol (N/A, 09/08/2018). Her family history includes Anxiety disorder in her brother, father, maternal aunt, maternal grandfather, maternal grandmother, maternal uncle, paternal aunt, and paternal uncle; Breast cancer in her maternal grandmother; Depression in her brother, maternal aunt, maternal grandfather, maternal grandmother, maternal uncle, paternal aunt, and paternal uncle; Diabetes in her maternal grandfather and maternal grandmother; Berenice Primas' disease in her mother; Hyperlipidemia in her mother; Hypertension in her mother; Ovarian cancer in  her maternal grandmother; Prostate cancer in her father. She  reports that she has been smoking cigars, e-cigarettes, and cigarettes. She started smoking about 13 years ago. She has been smoking about 0.25 packs per day. She has never used smokeless tobacco. She reports current drug use. Frequency: 3.00 times per week. Drug: Marijuana. She reports that she does not drink alcohol. She has a current medication list which includes the following prescription(s): albuterol, asmanex hfa, clonazepam, fentanyl, hydroxyzine, levothyroxine, medroxyprogesterone, mirtazapine, multiple vitamins-minerals, ondansetron, potassium chloride, prazosin, pregabalin, promethazine, and venlafaxine xr. She is allergic to fentanyl; morphine and related; and vicodin [hydrocodone-acetaminophen].       Review of Systems:  Review of Systems  Constitutional: Denied constitutional symptoms, night sweats, recent illness, fatigue, fever, insomnia and weight loss.  Eyes: Denied eye symptoms, eye pain, photophobia, vision change and visual disturbance.  Ears/Nose/Throat/Neck: Denied ear, nose, throat or neck symptoms, hearing loss, nasal discharge, sinus congestion and sore throat.  Cardiovascular: Denied cardiovascular symptoms, arrhythmia, chest pain/pressure, edema, exercise intolerance, orthopnea and palpitations.  Respiratory: Denied pulmonary symptoms, asthma, pleuritic pain, productive sputum, cough, dyspnea and wheezing.  Gastrointestinal: Denied, gastro-esophageal reflux, melena, nausea and vomiting.  Genitourinary: See HPI for additional information.  Musculoskeletal: Denied musculoskeletal symptoms, stiffness, swelling, muscle weakness and myalgia.  Dermatologic: Denied dermatology symptoms, rash and scar.  Neurologic: Denied neurology symptoms, dizziness, headache, neck pain and syncope.  Psychiatric: Denied psychiatric symptoms, anxiety and depression.  Endocrine: Denied endocrine symptoms including hot flashes and  night sweats.   Meds:   Current Outpatient Medications on File Prior to Visit  Medication Sig Dispense Refill  . albuterol (VENTOLIN HFA) 108 (90 Base) MCG/ACT inhaler Inhale 2 puffs into the lungs every 6 (six) hours as  needed for wheezing or shortness of breath. 1 Inhaler 3  . ASMANEX HFA 100 MCG/ACT AERO     . clonazePAM (KLONOPIN) 1 MG tablet Take 1 mg by mouth 2 (two) times daily.    . fentaNYL (DURAGESIC) 12 MCG/HR Place 1 patch onto the skin every 3 (three) days. 10 patch 0  . hydrOXYzine (ATARAX/VISTARIL) 10 MG tablet TAKE 1 TO 2 TABLETS BY MOUTH DAILY FOR BREAKTHOUGH ANXIETY.    Marland Kitchen levothyroxine (SYNTHROID) 25 MCG tablet Take 1 tablet (25 mcg total) by mouth daily before breakfast. 30 tablet 3  . medroxyPROGESTERone (DEPO-PROVERA) 150 MG/ML injection Inject 1 mL (150 mg total) into the muscle every 3 (three) months. 1 mL 2  . mirtazapine (REMERON) 45 MG tablet Take 1 tablet (45 mg total) by mouth at bedtime. 90 tablet 0  . Multiple Vitamins-Minerals (MULTIVITAMIN ADULT PO) Take 1 tablet by mouth.    . ondansetron (ZOFRAN-ODT) 8 MG disintegrating tablet Take 1 tablet (8 mg total) by mouth every 8 (eight) hours as needed for nausea or vomiting. 30 tablet 1  . potassium chloride (K-DUR) 10 MEQ tablet Take 1 tablet (10 mEq total) by mouth daily. 30 tablet 3  . prazosin (MINIPRESS) 5 MG capsule TAKE 1 CAPSULE BY MOUTH NIGHTLY    . pregabalin (LYRICA) 50 MG capsule Take 1 capsule (50 mg total) by mouth 3 (three) times daily. 90 capsule 3  . promethazine (PHENERGAN) 6.25 MG/5ML syrup TAKE  10 ML BY MOUTH EVERY 8 HOURS AS NEEDED FOR NAUSEA AND VOMITING OR  REFRACTORY  NAUSEA/VOMITING 240 mL 0  . venlafaxine XR (EFFEXOR-XR) 150 MG 24 hr capsule TAKE 2 CAPSULES BY MOUTH IN THE MORNING     No current facility-administered medications on file prior to visit.     Objective:     Vitals:   03/17/19 1117  BP: 125/90  Pulse: 93              I have reviewed the ultrasound findings from today  and her 6-week prior ultrasound and have discussed these in detail with the patient.   Abdominal examination reveals no evidence of hernia.  The pain could not be elicited by palpation.  Assessment:    G1P0010 Patient Active Problem List   Diagnosis Date Noted  . Fatigue 02/23/2019  . Dehydration 02/23/2019  . B12 deficiency 02/23/2019  . Vitamin D deficiency 02/23/2019  . Gastroenteritis presumed infectious 01/26/2019  . Migraine without status migrainosus, not intractable 01/26/2019  . Acute recurrent pansinusitis 01/03/2019  . Breakthrough bleeding on depo provera 10/29/2018  . Family history of uterine fibroid 10/29/2018  . History of ovarian cyst 10/29/2018  . Esophageal dysphagia   . Non-intractable vomiting   . Internal hemorrhoid   . Diarrhea   . Rectal bleeding   . Intractable migraine without aura and with status migrainosus 10/27/2016  . Nausea vomiting and diarrhea 10/25/2016  . MDD (major depressive disorder), recurrent episode, moderate ([place]) 06/23/2016  . PTSD (post-traumatic stress disorder) 06/23/2016  . GAD (generalized anxiety disorder) 06/23/2016  . Flatulence, eructation and gas pain 01/29/2016  . Adiposity 01/29/2016  . Encounter for other preprocedural examination 01/29/2016  . Chronic pancreatitis ([place]) 09/06/2015  . Headache 09/06/2015  . Asthma, mild intermittent 09/06/2015  . Iron deficiency anemia 03/29/2015  . Deep vein thrombosis ([place]) 07/20/2014  . Deep vein thrombosis (DVT) ([place]) 07/20/2014  . Disorder of pancreatic duct 02/13/2014  . Chronic abdominal pain 02/13/2014  . Chronic diarrhea 02/13/2014  . Acid indigestion  02/13/2014  . Anastomotic ulcer 04/29/2012  . Anxiety state 04/06/2012  . H/O gastrostomy ([place]) 04/06/2012  . History of gastrostomy ([place]) 04/06/2012  . Abdominal pain, epigastric 02/18/2012  . Feeling bilious 01/15/2012  . Acute inflammation of the pancreas 12/23/2011  . H/O surgical procedure 10/17/2011  . History of  surgical procedure 10/17/2011  . Bacterial pneumonia 06/19/2011  . Febrile 06/19/2011  . Clinical depression 02/18/2011  . Disease of female genital organs 02/18/2011  . Essential (primary) hypertension 02/18/2011  . Family history of cardiovascular disease 02/18/2011  . Family history of diabetes mellitus 02/18/2011  . Family history of breast cancer 02/18/2011  . Morbidity or mortality, unknown cause 02/18/2011  . Compulsive tobacco user syndrome 02/18/2011  . Current tobacco use 02/18/2011  . Illness 02/18/2011     1. Pelvic pain in female     I find no obvious source of patient's pelvic pain.  I do not believe the ovarian cyst warrant further investigation and certainly they are not the source of her pain. We have discussed her previous problems with dysmenorrhea and because fibroids seem to be ruled out I believe adenomyosis is a possibility.  This does not explain her current pelvic pain. I do not believe she has an abdominal hernia.  Plan:            1.  Patient to consider 2 weeks of fiber laxatives to keep her stool soft and make sure this is not the issue.  2.  I have reassured her in great detail regarding ovarian cysts and their benign nature.  3.  I discussed the possibility of future laparoscopy should the pain become disabling or a daily nuisance.  The risks and benefits of exploratory laparoscopy were discussed.  Specifically the 50-50 chance of finding a source of her pain. Orders No orders of the defined types were placed in this encounter.   No orders of the defined types were placed in this encounter.     F/U  No follow-ups on file. I spent 22 minutes involved in the care of this patient of which greater than 50% was spent discussing pelvic pain, ovarian cyst, adenomyosis, other causes of pelvic pain.  All questions answered.  Finis Bud, M.D. 03/17/2019 12:22 PM

## 2019-03-17 NOTE — Progress Notes (Signed)
Patient comes in today for ovarian cyst follow up. She had [place] today. 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

## 2019-03-22 NOTE — Progress Notes (Signed)
I started her on potassium supplement 03/02/2019

## 2019-03-24 ENCOUNTER — Encounter: Payer: Self-pay | Admitting: Nurse Practitioner

## 2019-03-24 ENCOUNTER — Ambulatory Visit: Payer: Medicare Other | Admitting: Nurse Practitioner

## 2019-03-24 ENCOUNTER — Other Ambulatory Visit: Payer: Self-pay

## 2019-03-24 VITALS — Ht 73.0 in | Wt 150.0 lb

## 2019-03-24 DIAGNOSIS — R109 Unspecified abdominal pain: Secondary | ICD-10-CM

## 2019-03-24 DIAGNOSIS — R112 Nausea with vomiting, unspecified: Secondary | ICD-10-CM | POA: Diagnosis not present

## 2019-03-24 DIAGNOSIS — G8929 Other chronic pain: Secondary | ICD-10-CM

## 2019-03-24 DIAGNOSIS — E876 Hypokalemia: Secondary | ICD-10-CM

## 2019-03-24 DIAGNOSIS — E039 Hypothyroidism, unspecified: Secondary | ICD-10-CM | POA: Diagnosis not present

## 2019-03-24 MED ORDER — LEVOTHYROXINE SODIUM 25 MCG PO TABS: 12.5000 ug | ORAL_TABLET | Freq: Every day | ORAL | 3 refills | Status: DC

## 2019-03-24 MED ORDER — FENTANYL 25 MCG/HR TD PT72: 1.0000 | MEDICATED_PATCH | TRANSDERMAL | 0 refills | Status: DC

## 2019-03-24 MED ORDER — PROMETHAZINE HCL 6.25 MG/5ML PO SYRP: ORAL_SOLUTION | ORAL | 2 refills | Status: DC

## 2019-03-24 NOTE — Progress Notes (Signed)
[facility] [place], [place] [address]  Internal MEDICINE  Telephone Visit  Patient Name: Stacy Pineda  [id number]  [id number]  Date of Service: 03/25/2019  I connected with the patient at 8:11pm by webcam and verified the patients identity using two identifiers.   I discussed the limitations, risks, security and privacy concerns of performing an evaluation and management service by webcam and the availability of in person appointments. I also discussed with the patient that there may be a patient responsible charge related to the service.  The patient expressed understanding and agrees to proceed.    Chief Complaint  Patient presents with  . Follow-up  . Anxiety  . Depression  . Thyroid Problem  . Fatigue    The patient has been contacted via webcam for follow up visit due to concerns for spread of novel coronavirus. The patient states that she continues to have abdominal pain with nausea and vomiting. Still having episodes of diarrhea. Sometimes, this is "explosive" in nature. She states that nausea has not improved. Stool studies were all negative. Potassium was low. Now on potassium supplement. Still gets so severe that she is unable to keep even fluids down. Promethazine syrup helping much more than ODT zofran. She does need a refill for this.  Vomiting increases her abdominal pain. She is scheduled to see her GI provider august 26.       Current Medication: Outpatient Encounter Medications as of 03/24/2019  Medication Sig Note  . albuterol (VENTOLIN HFA) 108 (90 Base) MCG/ACT inhaler Inhale 2 puffs into the lungs every 6 (six) hours as needed for wheezing or shortness of breath.   Darlin Priestly HFA 100 MCG/ACT AERO  06/28/2015: Received from: External Pharmacy  . clonazePAM (KLONOPIN) 1 MG tablet Take 1 mg by mouth 2 (two) times daily.   . hydrOXYzine (ATARAX/VISTARIL) 10 MG tablet TAKE 1 TO 2 TABLETS BY MOUTH DAILY FOR BREAKTHOUGH ANXIETY.   Marland Kitchen  levothyroxine (SYNTHROID) 25 MCG tablet Take 0.5 tablets (12.5 mcg total) by mouth daily before breakfast.   . medroxyPROGESTERone (DEPO-PROVERA) 150 MG/ML injection Inject 1 mL (150 mg total) into the muscle every 3 (three) months.   . mirtazapine (REMERON) 45 MG tablet Take 1 tablet (45 mg total) by mouth at bedtime.   . Multiple Vitamins-Minerals (MULTIVITAMIN ADULT PO) Take 1 tablet by mouth.   . ondansetron (ZOFRAN-ODT) 8 MG disintegrating tablet Take 1 tablet (8 mg total) by mouth every 8 (eight) hours as needed for nausea or vomiting.   . potassium chloride (K-DUR) 10 MEQ tablet Take 1 tablet (10 mEq total) by mouth daily.   . prazosin (MINIPRESS) 5 MG capsule TAKE 1 CAPSULE BY MOUTH NIGHTLY   . pregabalin (LYRICA) 50 MG capsule Take 1 capsule (50 mg total) by mouth 3 (three) times daily.   . promethazine (PHENERGAN) 6.25 MG/5ML syrup TAKE  10 ML BY MOUTH EVERY 8 HOURS AS NEEDED FOR NAUSEA AND VOMITING OR  REFRACTORY  NAUSEA/VOMITING   . venlafaxine XR (EFFEXOR-XR) 150 MG 24 hr capsule TAKE 2 CAPSULES BY MOUTH IN THE MORNING   . [DISCONTINUED] fentaNYL (DURAGESIC) 12 MCG/HR Place 1 patch onto the skin every 3 (three) days.   . [DISCONTINUED] levothyroxine (SYNTHROID) 25 MCG tablet Take 1 tablet (25 mcg total) by mouth daily before breakfast.   . [DISCONTINUED] promethazine (PHENERGAN) 6.25 MG/5ML syrup TAKE  10 ML BY MOUTH EVERY 8 HOURS AS NEEDED FOR NAUSEA AND VOMITING OR  REFRACTORY  NAUSEA/VOMITING   .  fentaNYL (DURAGESIC) 25 MCG/HR Place 1 patch onto the skin every 3 (three) days.    No facility-administered encounter medications on file as of 03/24/2019.     Surgical History: Past Surgical History:  Procedure Laterality Date  . ABDOMINAL SURGERY    . CHOLECYSTECTOMY    . COLONOSCOPY WITH PROPOFOL N/A 09/08/2018   Procedure: COLONOSCOPY WITH PROPOFOL;  Surgeon: Virgel Manifold, MD;  Location: [facility] ENDOSCOPY;  Service: Endoscopy;  Laterality: N/A;  . DECUBITUS ULCER EXCISION     . DILATION AND CURETTAGE OF UTERUS    . ESOPHAGOGASTRODUODENOSCOPY (EGD) WITH PROPOFOL N/A 09/08/2018   Procedure: ESOPHAGOGASTRODUODENOSCOPY (EGD) WITH PROPOFOL;  Surgeon: Virgel Manifold, MD;  Location: [facility] ENDOSCOPY;  Service: Endoscopy;  Laterality: N/A;  . GASTRIC BYPASS    . GASTRIC BYPASS    . REVISION GASTRIC RESTRICTIVE PROCEDURE FOR MORBID OBESITY      Medical History: Past Medical History:  Diagnosis Date  . Anxiety   . Asthma   . Bilateral ovarian cysts   . Chronic headaches   . Depression   . Hypothyroidism   . Pancreatitis   . Pancreatitis   . PTSD (post-traumatic stress disorder)     Family History: Family History  Problem Relation Age of Onset  . Graves' disease Mother   . Hypertension Mother   . Hyperlipidemia Mother   . Anxiety disorder Father   . Prostate cancer Father   . Depression Brother   . Anxiety disorder Brother   . Anxiety disorder Maternal Aunt   . Depression Maternal Aunt   . Anxiety disorder Paternal Aunt   . Depression Paternal Aunt   . Anxiety disorder Maternal Uncle   . Depression Maternal Uncle   . Anxiety disorder Paternal Uncle   . Depression Paternal Uncle   . Anxiety disorder Maternal Grandfather   . Depression Maternal Grandfather   . Diabetes Maternal Grandfather   . Anxiety disorder Maternal Grandmother   . Depression Maternal Grandmother   . Breast cancer Maternal Grandmother   . Ovarian cancer Maternal Grandmother   . Diabetes Maternal Grandmother   . Colon cancer Neg Hx     Social History   Socioeconomic History  . Marital status: Single    Spouse name: Not on file  . Number of children: Not on file  . Years of education: Not on file  . Highest education level: Not on file  Occupational History  . Not on file  Social Needs  . Financial resource strain: Not on file  . Food insecurity    Worry: Not on file    Inability: Not on file  . Transportation needs    Medical: Not on file    Non-medical: Not  on file  Tobacco Use  . Smoking status: Current Some Day Smoker    Packs/day: 0.25    Types: Cigars, E-cigarettes, Cigarettes    Start date: 03/26/2005  . Smokeless tobacco: Never Used  Substance and Sexual Activity  . Alcohol use: No    Alcohol/week: 0.0 standard drinks  . Drug use: Yes    Frequency: 3.0 times per week    Types: Marijuana    Comment: MJ use weekly  . Sexual activity: Not Currently    Birth control/protection: Injection  Lifestyle  . Physical activity    Days per week: Not on file    Minutes per session: Not on file  . Stress: Not on file  Relationships  . Social Herbalist on phone: Not  on file    Gets together: Not on file    Attends religious service: Not on file    Active member of club or organization: Not on file    Attends meetings of clubs or organizations: Not on file    Relationship status: Not on file  . Intimate partner violence    Fear of current or ex partner: Not on file    Emotionally abused: Not on file    Physically abused: Not on file    Forced sexual activity: Not on file  Other Topics Concern  . Not on file  Social History Narrative  . Not on file      Review of Systems  Constitutional: Positive for activity change, chills and fatigue. Negative for fever and unexpected weight change.  HENT: Negative for congestion, postnasal drip, rhinorrhea, sinus pressure, sinus pain, sneezing and sore throat.   Respiratory: Negative for cough, chest tightness, shortness of breath and wheezing.   Cardiovascular: Negative for chest pain and palpitations.  Gastrointestinal: Positive for abdominal pain, diarrhea, nausea and vomiting. Negative for constipation.  Endocrine: Negative for cold intolerance, heat intolerance, polydipsia and polyuria.       Started on levothyroxine 12mcg daily at her last visit. Having some negative side effects since then.   Musculoskeletal: Positive for arthralgias and myalgias. Negative for back pain, joint  swelling and neck pain.  Skin: Negative for rash.  Allergic/Immunologic: Positive for environmental allergies.  Neurological: Positive for headaches. Negative for tremors and numbness.  Hematological: Negative for adenopathy. Does not bruise/bleed easily.  Psychiatric/Behavioral: Positive for dysphoric mood. Negative for behavioral problems and sleep disturbance. The patient is not nervous/anxious.    Today's Vitals   03/24/19 1641  Weight: 150 lb (68 kg)  Height: 6\' 1"  (1.854 m)   Body mass index is 19.79 kg/m.  Observation/Objective:   The patient is alert and oriented. She is pleasant and answers all questions appropriately. Breathing is non-labored. She is in no acute distress at this time.    Assessment/Plan: 1. Acquired hypothyroidism Will have patinet reduce dose levothyroxine to 12.17mcg daily. Recheck thyroid paneil in 2 to 3 months - levothyroxine (SYNTHROID) 25 MCG tablet; Take 0.5 tablets (12.5 mcg total) by mouth daily before breakfast.  Dispense: 30 tablet; Refill: 3  2. Chronic abdominal pain Having breakthrough pain with vomiting. Will increase fentanyl patch to 38mcg/hour, changing every 72 hours. Reviewed risks and possible side effects associated with taking opiates, benzodiazepines and other CNS depressants. Combination of these could cause dizziness and drowsiness. Advised patient not to drive or operate machinery when taking these medications, as patient's and other's life can be at risk and will have consequences. Patient verbalized understanding in this matter. Dependence and abuse for these drugs will be monitored closely. A Controlled substance policy and procedure is on file which allows [facility] to order a urine drug screen test at any visit. Patient understands and agrees with the plan. Check amylsae and lipase with labs . - fentaNYL (DURAGESIC) 25 MCG/HR; Place 1 patch onto the skin every 3 (three) days.  Dispense: 10 patch; Refill: 0  3.  Nausea and vomiting, intractability of vomiting not specified, unspecified vomiting type - promethazine (PHENERGAN) 6.25 MG/5ML syrup; TAKE  10 ML BY MOUTH EVERY 8 HOURS AS NEEDED FOR NAUSEA AND VOMITING OR  REFRACTORY  NAUSEA/VOMITING  Dispense: 240 mL; Refill: 2  4. Hypokalemia Check BMP after starting potassium supplement.   General Counseling: allanna bresee understanding of the findings of  today's phone visit and agrees with plan of treatment. I have discussed any further diagnostic evaluation that may be needed or ordered today. We also reviewed her medications today. she has been encouraged to call the office with any questions or concerns that should arise related to todays visit.   This patient was seen by [place] with Dr Lavera Guise as a part of collaborative care agreement  Meds ordered this encounter  Medications  . levothyroxine (SYNTHROID) 25 MCG tablet    Sig: Take 0.5 tablets (12.5 mcg total) by mouth daily before breakfast.    Dispense:  30 tablet    Refill:  3    Decrease dose due to negative side effects.    Order Specific Question:   Supervising Provider    Answer:   Lavera Guise [[id number]]  . fentaNYL (DURAGESIC) 25 MCG/HR    Sig: Place 1 patch onto the skin every 3 (three) days.    Dispense:  10 patch    Refill:  0    Please note increased dose    Order Specific Question:   Supervising Provider    Answer:   Lavera Guise [[id number]]  . promethazine (PHENERGAN) 6.25 MG/5ML syrup    Sig: TAKE  10 ML BY MOUTH EVERY 8 HOURS AS NEEDED FOR NAUSEA AND VOMITING OR  REFRACTORY  NAUSEA/VOMITING    Dispense:  240 mL    Refill:  2    Order Specific Question:   Supervising Provider    Answer:   Lavera Guise [[id number]]    Time spent: 80 Minutes    Dr Lavera Guise Internal medicine

## 2019-03-25 DIAGNOSIS — E876 Hypokalemia: Secondary | ICD-10-CM | POA: Insufficient documentation

## 2019-03-25 DIAGNOSIS — E039 Hypothyroidism, unspecified: Secondary | ICD-10-CM | POA: Insufficient documentation

## 2019-04-07 DIAGNOSIS — K8681 Exocrine pancreatic insufficiency: Secondary | ICD-10-CM | POA: Diagnosis not present

## 2019-04-12 DIAGNOSIS — F410 Panic disorder [episodic paroxysmal anxiety] without agoraphobia: Secondary | ICD-10-CM | POA: Diagnosis not present

## 2019-04-12 DIAGNOSIS — F332 Major depressive disorder, recurrent severe without psychotic features: Secondary | ICD-10-CM | POA: Diagnosis not present

## 2019-04-12 DIAGNOSIS — F4312 Post-traumatic stress disorder, chronic: Secondary | ICD-10-CM | POA: Diagnosis not present

## 2019-04-25 ENCOUNTER — Ambulatory Visit (INDEPENDENT_AMBULATORY_CARE_PROVIDER_SITE_OTHER): Payer: Medicare Other | Admitting: Certified Nurse Midwife

## 2019-04-25 ENCOUNTER — Other Ambulatory Visit: Payer: Self-pay

## 2019-04-25 VITALS — BP 114/80 | HR 89 | Ht 73.0 in | Wt 154.7 lb

## 2019-04-25 DIAGNOSIS — Z3042 Encounter for surveillance of injectable contraceptive: Secondary | ICD-10-CM | POA: Diagnosis not present

## 2019-04-25 MED ORDER — MEDROXYPROGESTERONE ACETATE 150 MG/ML IM SUSP
150.0000 mg | Freq: Once | INTRAMUSCULAR | Status: AC
  Administered 2019-04-25: 150 mg via INTRAMUSCULAR

## 2019-04-25 MED ORDER — CYANOCOBALAMIN 1000 MCG/ML IJ SOLN: 1000.0000 ug | Freq: Once | INTRAMUSCULAR | Status: DC

## 2019-04-25 NOTE — Progress Notes (Signed)
Date last pap: na Last Depo-Provera: 02/04/19 Side Effects if any: na Serum HCG indicated? na Depo-Provera 150 mg IM given by: Keturah Barre, CMA Next appointment due 07/11/19-07/25/19 BP 114/80   Pulse 89   Ht 6\' 1"  (1.854 m)   Wt 154 lb 11.2 oz (70.2 kg)   BMI 20.41 kg/m    Pt also requested to have refill on Vicodin to have on hand for pain associated with her ovarian cyst

## 2019-04-25 NOTE — Progress Notes (Signed)
I have reviewed the record and concur with patient management and care.   Am I declining Vicodin prescription at this time and patient my follow up with pain management or office as needed with symptoms of ovarian cyst.    Diona Fanti, CNM [facility], [facility] 04/25/19 11:04 AM

## 2019-04-27 ENCOUNTER — Ambulatory Visit: Payer: Medicare Other

## 2019-04-29 ENCOUNTER — Ambulatory Visit: Payer: Medicare Other | Admitting: Nurse Practitioner

## 2019-05-02 ENCOUNTER — Ambulatory Visit (INDEPENDENT_AMBULATORY_CARE_PROVIDER_SITE_OTHER): Payer: Medicare Other | Admitting: Nurse Practitioner

## 2019-05-02 ENCOUNTER — Encounter: Payer: Self-pay | Admitting: Nurse Practitioner

## 2019-05-02 ENCOUNTER — Other Ambulatory Visit: Payer: Self-pay

## 2019-05-02 VITALS — BP 152/97 | HR 97 | Temp 97.8°F | Resp 16 | Ht 73.0 in | Wt 150.0 lb

## 2019-05-02 DIAGNOSIS — K861 Other chronic pancreatitis: Secondary | ICD-10-CM | POA: Diagnosis not present

## 2019-05-02 DIAGNOSIS — G8929 Other chronic pain: Secondary | ICD-10-CM | POA: Diagnosis not present

## 2019-05-02 DIAGNOSIS — G43909 Migraine, unspecified, not intractable, without status migrainosus: Secondary | ICD-10-CM

## 2019-05-02 DIAGNOSIS — R109 Unspecified abdominal pain: Secondary | ICD-10-CM | POA: Diagnosis not present

## 2019-05-02 DIAGNOSIS — Z79899 Other long term (current) drug therapy: Secondary | ICD-10-CM | POA: Diagnosis not present

## 2019-05-02 DIAGNOSIS — E039 Hypothyroidism, unspecified: Secondary | ICD-10-CM | POA: Diagnosis not present

## 2019-05-02 LAB — POCT URINE DRUG SCREEN
POC Amphetamine UR: NOT DETECTED
POC BENZODIAZEPINES UR: NOT DETECTED
POC Barbiturate UR: POSITIVE — AB
POC Cocaine UR: NOT DETECTED
POC Ecstasy UR: NOT DETECTED
POC Marijuana UR: POSITIVE — AB
POC Methadone UR: NOT DETECTED
POC Methamphetamine UR: NOT DETECTED
POC Opiate Ur: NOT DETECTED
POC Oxycodone UR: NOT DETECTED
POC PHENCYCLIDINE UR: NOT DETECTED
POC TRICYCLICS UR: POSITIVE — AB

## 2019-05-02 MED ORDER — FENTANYL 25 MCG/HR TD PT72: 1.0000 | MEDICATED_PATCH | TRANSDERMAL | 0 refills | Status: DC

## 2019-05-02 MED ORDER — LEVOTHYROXINE SODIUM 25 MCG PO TABS: 25.0000 ug | ORAL_TABLET | Freq: Every day | ORAL | 3 refills | Status: DC

## 2019-05-02 MED ORDER — UBRELVY 100 MG PO TABS: 100.0000 mg | ORAL_TABLET | ORAL | 2 refills | Status: DC | PRN

## 2019-05-02 MED ORDER — PREGABALIN 50 MG PO CAPS: 50.0000 mg | ORAL_CAPSULE | Freq: Three times a day (TID) | ORAL | 3 refills | Status: DC

## 2019-05-02 NOTE — Progress Notes (Signed)
[facility] [place], [place] [address]  Internal MEDICINE  Office Visit Note  Patient Name: Stacy Pineda  [id number]  [id number]  Date of Service: 05/02/2019  Chief Complaint  Patient presents with  . Follow-up    changed fentanyl    The patient is here for follow up visit. She states that she is doing better with 59mcg patch. States that she does get a bit constipated on the patch. Will have episodes of diarrhea on the days that she changes the patch.  She continues to have migraines headaches. States that she is getting about four migraines per month. States that the butalbital doesn't really work as well as it used to. She had been given samples of ubrelvy in the past. States that she had to take two, but within two hours, the headache had resolved . Overall, she is doing well.       Current Medication: Outpatient Encounter Medications as of 05/02/2019  Medication Sig Note  . albuterol (VENTOLIN HFA) 108 (90 Base) MCG/ACT inhaler Inhale 2 puffs into the lungs every 6 (six) hours as needed for wheezing or shortness of breath.   Darlin Priestly HFA 100 MCG/ACT AERO  06/28/2015: Received from: External Pharmacy  . clonazePAM (KLONOPIN) 1 MG tablet Take 1 mg by mouth 2 (two) times daily.   . fentaNYL (DURAGESIC) 25 MCG/HR Place 1 patch onto the skin every 3 (three) days.   . hydrOXYzine (ATARAX/VISTARIL) 10 MG tablet TAKE 1 TO 2 TABLETS BY MOUTH DAILY FOR BREAKTHOUGH ANXIETY.   Marland Kitchen levothyroxine (SYNTHROID) 25 MCG tablet Take 1 tablet (25 mcg total) by mouth daily before breakfast.   . medroxyPROGESTERone (DEPO-PROVERA) 150 MG/ML injection Inject 1 mL (150 mg total) into the muscle every 3 (three) months.   . mirtazapine (REMERON) 45 MG tablet Take 1 tablet (45 mg total) by mouth at bedtime.   . Multiple Vitamins-Minerals (MULTIVITAMIN ADULT PO) Take 1 tablet by mouth.   . ondansetron (ZOFRAN-ODT) 8 MG disintegrating tablet Take 1 tablet (8 mg total) by mouth every  8 (eight) hours as needed for nausea or vomiting.   . potassium chloride (K-DUR) 10 MEQ tablet Take 1 tablet (10 mEq total) by mouth daily.   . prazosin (MINIPRESS) 5 MG capsule TAKE 1 CAPSULE BY MOUTH NIGHTLY   . pregabalin (LYRICA) 50 MG capsule Take 1 capsule (50 mg total) by mouth 3 (three) times daily.   . promethazine (PHENERGAN) 6.25 MG/5ML syrup TAKE  10 ML BY MOUTH EVERY 8 HOURS AS NEEDED FOR NAUSEA AND VOMITING OR  REFRACTORY  NAUSEA/VOMITING   . Ubrogepant (UBRELVY) 100 MG TABS Take 100 mg by mouth as needed.   . venlafaxine XR (EFFEXOR-XR) 150 MG 24 hr capsule TAKE 2 CAPSULES BY MOUTH IN THE MORNING   . [DISCONTINUED] fentaNYL (DURAGESIC) 25 MCG/HR Place 1 patch onto the skin every 3 (three) days.   . [DISCONTINUED] levothyroxine (SYNTHROID) 25 MCG tablet Take 0.5 tablets (12.5 mcg total) by mouth daily before breakfast.   . [DISCONTINUED] pregabalin (LYRICA) 50 MG capsule Take 1 capsule (50 mg total) by mouth 3 (three) times daily.    No facility-administered encounter medications on file as of 05/02/2019.     Surgical History: Past Surgical History:  Procedure Laterality Date  . ABDOMINAL SURGERY    . CHOLECYSTECTOMY    . COLONOSCOPY WITH PROPOFOL N/A 09/08/2018   Procedure: COLONOSCOPY WITH PROPOFOL;  Surgeon: Virgel Manifold, MD;  Location: [facility] ENDOSCOPY;  Service: Endoscopy;  Laterality: N/A;  . DECUBITUS ULCER EXCISION    . DILATION AND CURETTAGE OF UTERUS    . ESOPHAGOGASTRODUODENOSCOPY (EGD) WITH PROPOFOL N/A 09/08/2018   Procedure: ESOPHAGOGASTRODUODENOSCOPY (EGD) WITH PROPOFOL;  Surgeon: Virgel Manifold, MD;  Location: [facility] ENDOSCOPY;  Service: Endoscopy;  Laterality: N/A;  . GASTRIC BYPASS    . GASTRIC BYPASS    . REVISION GASTRIC RESTRICTIVE PROCEDURE FOR MORBID OBESITY      Medical History: Past Medical History:  Diagnosis Date  . Anxiety   . Asthma   . Bilateral ovarian cysts   . Chronic headaches   . Depression   . Hypothyroidism   .  Pancreatitis   . Pancreatitis   . PTSD (post-traumatic stress disorder)     Family History: Family History  Problem Relation Age of Onset  . Graves' disease Mother   . Hypertension Mother   . Hyperlipidemia Mother   . Anxiety disorder Father   . Prostate cancer Father   . Depression Brother   . Anxiety disorder Brother   . Anxiety disorder Maternal Aunt   . Depression Maternal Aunt   . Anxiety disorder Paternal Aunt   . Depression Paternal Aunt   . Anxiety disorder Maternal Uncle   . Depression Maternal Uncle   . Anxiety disorder Paternal Uncle   . Depression Paternal Uncle   . Anxiety disorder Maternal Grandfather   . Depression Maternal Grandfather   . Diabetes Maternal Grandfather   . Anxiety disorder Maternal Grandmother   . Depression Maternal Grandmother   . Breast cancer Maternal Grandmother   . Ovarian cancer Maternal Grandmother   . Diabetes Maternal Grandmother   . Colon cancer Neg Hx     Social History   Socioeconomic History  . Marital status: Single    Spouse name: Not on file  . Number of children: Not on file  . Years of education: Not on file  . Highest education level: Not on file  Occupational History  . Not on file  Social Needs  . Financial resource strain: Not on file  . Food insecurity    Worry: Not on file    Inability: Not on file  . Transportation needs    Medical: Not on file    Non-medical: Not on file  Tobacco Use  . Smoking status: Current Some Day Smoker    Packs/day: 0.25    Types: Cigars, E-cigarettes, Cigarettes    Start date: 03/26/2005  . Smokeless tobacco: Never Used  Substance and Sexual Activity  . Alcohol use: No    Alcohol/week: 0.0 standard drinks  . Drug use: Yes    Frequency: 3.0 times per week    Types: Marijuana    Comment: MJ use weekly  . Sexual activity: Not Currently    Birth control/protection: Injection  Lifestyle  . Physical activity    Days per week: Not on file    Minutes per session: Not on  file  . Stress: Not on file  Relationships  . Social Herbalist on phone: Not on file    Gets together: Not on file    Attends religious service: Not on file    Active member of club or organization: Not on file    Attends meetings of clubs or organizations: Not on file    Relationship status: Not on file  . Intimate partner violence    Fear of current or ex partner: Not on file    Emotionally abused: Not on file  Physically abused: Not on file    Forced sexual activity: Not on file  Other Topics Concern  . Not on file  Social History Narrative  . Not on file      Review of Systems  Constitutional: Positive for fatigue. Negative for activity change, chills, fever and unexpected weight change.       Improved appetitie  HENT: Negative for congestion, postnasal drip, rhinorrhea, sinus pressure, sinus pain, sneezing and sore throat.   Respiratory: Negative for cough, chest tightness, shortness of breath and wheezing.   Cardiovascular: Negative for chest pain and palpitations.  Gastrointestinal: Positive for abdominal pain and constipation. Negative for diarrhea, nausea and vomiting.       Has some constipation now that fentanyl patch dose has increased.   Endocrine: Negative for cold intolerance, heat intolerance, polydipsia and polyuria.       Started on levothyroxine 81mcg daily at her last visit. Notes severe fatigue since her last visit.   Musculoskeletal: Positive for arthralgias and myalgias. Negative for back pain, joint swelling and neck pain.  Skin: Negative for rash.  Allergic/Immunologic: Positive for environmental allergies.  Neurological: Positive for headaches. Negative for tremors and numbness.  Hematological: Negative for adenopathy. Does not bruise/bleed easily.  Psychiatric/Behavioral: Positive for dysphoric mood. Negative for behavioral problems and sleep disturbance. The patient is not nervous/anxious.     Today's Vitals   05/02/19 1042  BP: (!)  152/97  Pulse: 97  Resp: 16  Temp: 97.8 F (36.6 C)  Weight: 150 lb (68 kg)  Height: 6\' 1"  (1.854 m)   Body mass index is 19.79 kg/m.  Physical Exam Vitals signs and nursing note reviewed.  Constitutional:      General: She is not in acute distress.    Appearance: Normal appearance. She is well-developed. She is not diaphoretic.  HENT:     Head: Normocephalic and atraumatic.     Mouth/Throat:     Pharynx: No oropharyngeal exudate.  Eyes:     Pupils: Pupils are equal, round, and reactive to light.  Neck:     Musculoskeletal: Normal range of motion and neck supple.     Thyroid: No thyromegaly.     Vascular: No JVD.     Trachea: No tracheal deviation.  Cardiovascular:     Rate and Rhythm: Normal rate and regular rhythm.     Heart sounds: Normal heart sounds. No murmur. No friction rub. No gallop.   Pulmonary:     Effort: Pulmonary effort is normal. No respiratory distress.     Breath sounds: Normal breath sounds. No wheezing or rales.  Chest:     Chest wall: No tenderness.  Abdominal:     General: Bowel sounds are normal.     Palpations: Abdomen is soft.     Tenderness: There is abdominal tenderness.     Comments: Generalized abdominal pain.  Musculoskeletal: Normal range of motion.  Lymphadenopathy:     Cervical: No cervical adenopathy.  Skin:    General: Skin is warm and dry.  Neurological:     Mental Status: She is alert and oriented to person, place, and time.     Cranial Nerves: No cranial nerve deficit.  Psychiatric:        Behavior: Behavior normal.        Thought Content: Thought content normal.        Judgment: Judgment normal.   Assessment/Plan: 1. Acquired hypothyroidism Will increase levothyroxine to 73mcg daily. Recheck thyroid panel in two months for further  evaluation.  - levothyroxine (SYNTHROID) 25 MCG tablet; Take 1 tablet (25 mcg total) by mouth daily before breakfast.  Dispense: 30 tablet; Refill: 3  2. Migraine without status migrainosus,  not intractable, unspecified migraine type Will add back ubrelvy 100mg  tablets to take as needed for migraines. Samples were provided today.  - Ubrogepant (UBRELVY) 100 MG TABS; Take 100 mg by mouth as needed.  Dispense: 10 tablet; Refill: 2  3. Chronic abdominal pain May continue to use fentanyl 58mcg every three days for pain management. New prescription sent to her pharmacy today.   4. Chronic pancreatitis, unspecified pancreatitis type [facility]) Bariatric surgeon has restarted her back on pancreatic enxymes.   5. Encounter for long-term (current) use of medications - POCT Urine Drug Screen positive for THC. Long discussion about using marijuana in conjunction with fentanyl patch. She does understand the risks associated with this combination. She uses marijuana to help improve appetite. Weight has stabilized. Will repeat UDS at next, in-office visit.   This patient was seen by Leretha Pol FNP Collaboration with Dr Lavera Guise as a part of collaborative care agreement  General Counseling: Jonathon Resides understanding of the findings of todays visit and agrees with plan of treatment. I have discussed any further diagnostic evaluation that may be needed or ordered today. We also reviewed her medications today. she has been encouraged to call the office with any questions or concerns that should arise related to todays visit.    Orders Placed This Encounter  Procedures  . POCT Urine Drug Screen    Meds ordered this encounter  Medications  . levothyroxine (SYNTHROID) 25 MCG tablet    Sig: Take 1 tablet (25 mcg total) by mouth daily before breakfast.    Dispense:  30 tablet    Refill:  3    Patient increase back to 8mcg.    Order Specific Question:   Supervising Provider    Answer:   Lavera Guise [id number]  . Ubrogepant (UBRELVY) 100 MG TABS    Sig: Take 100 mg by mouth as needed.    Dispense:  10 tablet    Refill:  2    Samples provided today.    Order Specific Question:    Supervising Provider    Answer:   Lavera Guise [id number]  . fentaNYL (DURAGESIC) 25 MCG/HR    Sig: Place 1 patch onto the skin every 3 (three) days.    Dispense:  10 patch    Refill:  0    Please note increased dose    Order Specific Question:   Supervising Provider    Answer:   Lavera Guise [id number]  . pregabalin (LYRICA) 50 MG capsule    Sig: Take 1 capsule (50 mg total) by mouth 3 (three) times daily.    Dispense:  90 capsule    Refill:  3    Order Specific Question:   Supervising Provider    Answer:   Lavera Guise [[id number]]    Time spent: 69 Minutes      Dr Lavera Guise Internal medicine

## 2019-05-04 DIAGNOSIS — K219 Gastro-esophageal reflux disease without esophagitis: Secondary | ICD-10-CM | POA: Diagnosis not present

## 2019-05-04 DIAGNOSIS — F419 Anxiety disorder, unspecified: Secondary | ICD-10-CM | POA: Diagnosis not present

## 2019-05-04 DIAGNOSIS — Z9884 Bariatric surgery status: Secondary | ICD-10-CM | POA: Diagnosis not present

## 2019-05-04 DIAGNOSIS — J45909 Unspecified asthma, uncomplicated: Secondary | ICD-10-CM | POA: Diagnosis not present

## 2019-05-04 DIAGNOSIS — K279 Peptic ulcer, site unspecified, unspecified as acute or chronic, without hemorrhage or perforation: Secondary | ICD-10-CM | POA: Diagnosis not present

## 2019-05-04 DIAGNOSIS — D649 Anemia, unspecified: Secondary | ICD-10-CM | POA: Diagnosis not present

## 2019-05-05 DIAGNOSIS — K869 Disease of pancreas, unspecified: Secondary | ICD-10-CM | POA: Diagnosis not present

## 2019-05-05 DIAGNOSIS — R112 Nausea with vomiting, unspecified: Secondary | ICD-10-CM | POA: Diagnosis not present

## 2019-05-05 DIAGNOSIS — R197 Diarrhea, unspecified: Secondary | ICD-10-CM | POA: Diagnosis not present

## 2019-05-05 DIAGNOSIS — K8681 Exocrine pancreatic insufficiency: Secondary | ICD-10-CM | POA: Diagnosis not present

## 2019-05-05 DIAGNOSIS — G8929 Other chronic pain: Secondary | ICD-10-CM | POA: Diagnosis not present

## 2019-05-05 DIAGNOSIS — R109 Unspecified abdominal pain: Secondary | ICD-10-CM | POA: Diagnosis not present

## 2019-05-05 DIAGNOSIS — Z9884 Bariatric surgery status: Secondary | ICD-10-CM | POA: Diagnosis not present

## 2019-05-06 DIAGNOSIS — F419 Anxiety disorder, unspecified: Secondary | ICD-10-CM | POA: Diagnosis not present

## 2019-05-06 DIAGNOSIS — K219 Gastro-esophageal reflux disease without esophagitis: Secondary | ICD-10-CM | POA: Diagnosis not present

## 2019-05-06 DIAGNOSIS — Z9884 Bariatric surgery status: Secondary | ICD-10-CM | POA: Diagnosis not present

## 2019-05-06 DIAGNOSIS — F4310 Post-traumatic stress disorder, unspecified: Secondary | ICD-10-CM | POA: Diagnosis not present

## 2019-05-06 DIAGNOSIS — D689 Coagulation defect, unspecified: Secondary | ICD-10-CM | POA: Diagnosis not present

## 2019-05-06 DIAGNOSIS — Z7951 Long term (current) use of inhaled steroids: Secondary | ICD-10-CM | POA: Diagnosis not present

## 2019-05-06 DIAGNOSIS — K660 Peritoneal adhesions (postprocedural) (postinfection): Secondary | ICD-10-CM | POA: Diagnosis not present

## 2019-05-06 DIAGNOSIS — J4520 Mild intermittent asthma, uncomplicated: Secondary | ICD-10-CM | POA: Diagnosis not present

## 2019-05-06 DIAGNOSIS — Z79899 Other long term (current) drug therapy: Secondary | ICD-10-CM | POA: Diagnosis not present

## 2019-05-06 DIAGNOSIS — G43919 Migraine, unspecified, intractable, without status migrainosus: Secondary | ICD-10-CM | POA: Diagnosis not present

## 2019-05-06 DIAGNOSIS — I10 Essential (primary) hypertension: Secondary | ICD-10-CM | POA: Diagnosis not present

## 2019-05-06 DIAGNOSIS — Z86718 Personal history of other venous thrombosis and embolism: Secondary | ICD-10-CM | POA: Diagnosis not present

## 2019-05-06 DIAGNOSIS — K529 Noninfective gastroenteritis and colitis, unspecified: Secondary | ICD-10-CM | POA: Diagnosis not present

## 2019-05-06 DIAGNOSIS — E43 Unspecified severe protein-calorie malnutrition: Secondary | ICD-10-CM | POA: Diagnosis not present

## 2019-05-06 DIAGNOSIS — F17210 Nicotine dependence, cigarettes, uncomplicated: Secondary | ICD-10-CM | POA: Diagnosis not present

## 2019-05-06 DIAGNOSIS — R109 Unspecified abdominal pain: Secondary | ICD-10-CM | POA: Diagnosis not present

## 2019-05-06 DIAGNOSIS — F418 Other specified anxiety disorders: Secondary | ICD-10-CM | POA: Diagnosis not present

## 2019-05-06 DIAGNOSIS — E46 Unspecified protein-calorie malnutrition: Secondary | ICD-10-CM | POA: Diagnosis not present

## 2019-05-06 DIAGNOSIS — G8929 Other chronic pain: Secondary | ICD-10-CM | POA: Diagnosis not present

## 2019-05-06 DIAGNOSIS — D509 Iron deficiency anemia, unspecified: Secondary | ICD-10-CM | POA: Diagnosis not present

## 2019-05-06 DIAGNOSIS — E039 Hypothyroidism, unspecified: Secondary | ICD-10-CM | POA: Diagnosis not present

## 2019-05-06 DIAGNOSIS — F329 Major depressive disorder, single episode, unspecified: Secondary | ICD-10-CM | POA: Diagnosis not present

## 2019-05-06 DIAGNOSIS — R1310 Dysphagia, unspecified: Secondary | ICD-10-CM | POA: Diagnosis not present

## 2019-05-06 DIAGNOSIS — Z8711 Personal history of peptic ulcer disease: Secondary | ICD-10-CM | POA: Diagnosis not present

## 2019-05-06 DIAGNOSIS — Z9049 Acquired absence of other specified parts of digestive tract: Secondary | ICD-10-CM | POA: Diagnosis not present

## 2019-05-06 DIAGNOSIS — K210 Gastro-esophageal reflux disease with esophagitis: Secondary | ICD-10-CM | POA: Diagnosis not present

## 2019-05-06 DIAGNOSIS — K8689 Other specified diseases of pancreas: Secondary | ICD-10-CM | POA: Diagnosis not present

## 2019-05-06 DIAGNOSIS — K30 Functional dyspepsia: Secondary | ICD-10-CM | POA: Diagnosis not present

## 2019-05-06 DIAGNOSIS — Z6820 Body mass index (BMI) 20.0-20.9, adult: Secondary | ICD-10-CM | POA: Diagnosis not present

## 2019-05-06 DIAGNOSIS — Z4682 Encounter for fitting and adjustment of non-vascular catheter: Secondary | ICD-10-CM | POA: Diagnosis not present

## 2019-05-06 DIAGNOSIS — K2950 Unspecified chronic gastritis without bleeding: Secondary | ICD-10-CM | POA: Diagnosis not present

## 2019-05-06 DIAGNOSIS — R710 Precipitous drop in hematocrit: Secondary | ICD-10-CM | POA: Diagnosis not present

## 2019-05-06 DIAGNOSIS — K861 Other chronic pancreatitis: Secondary | ICD-10-CM | POA: Diagnosis not present

## 2019-05-06 DIAGNOSIS — R634 Abnormal weight loss: Secondary | ICD-10-CM | POA: Diagnosis not present

## 2019-05-17 DIAGNOSIS — E46 Unspecified protein-calorie malnutrition: Secondary | ICD-10-CM | POA: Diagnosis not present

## 2019-05-17 DIAGNOSIS — F419 Anxiety disorder, unspecified: Secondary | ICD-10-CM | POA: Diagnosis not present

## 2019-05-17 DIAGNOSIS — G43011 Migraine without aura, intractable, with status migrainosus: Secondary | ICD-10-CM | POA: Diagnosis not present

## 2019-05-17 DIAGNOSIS — K861 Other chronic pancreatitis: Secondary | ICD-10-CM | POA: Diagnosis not present

## 2019-05-17 DIAGNOSIS — Z434 Encounter for attention to other artificial openings of digestive tract: Secondary | ICD-10-CM | POA: Diagnosis not present

## 2019-05-17 DIAGNOSIS — Z720 Tobacco use: Secondary | ICD-10-CM | POA: Diagnosis not present

## 2019-05-17 DIAGNOSIS — E039 Hypothyroidism, unspecified: Secondary | ICD-10-CM | POA: Diagnosis not present

## 2019-05-17 DIAGNOSIS — Z86718 Personal history of other venous thrombosis and embolism: Secondary | ICD-10-CM | POA: Diagnosis not present

## 2019-05-17 DIAGNOSIS — J4520 Mild intermittent asthma, uncomplicated: Secondary | ICD-10-CM | POA: Diagnosis not present

## 2019-05-17 DIAGNOSIS — F319 Bipolar disorder, unspecified: Secondary | ICD-10-CM | POA: Diagnosis not present

## 2019-05-17 DIAGNOSIS — G8929 Other chronic pain: Secondary | ICD-10-CM | POA: Diagnosis not present

## 2019-05-17 DIAGNOSIS — R1310 Dysphagia, unspecified: Secondary | ICD-10-CM | POA: Diagnosis not present

## 2019-05-17 DIAGNOSIS — D509 Iron deficiency anemia, unspecified: Secondary | ICD-10-CM | POA: Diagnosis not present

## 2019-05-17 DIAGNOSIS — Z9181 History of falling: Secondary | ICD-10-CM | POA: Diagnosis not present

## 2019-05-17 DIAGNOSIS — K2100 Gastro-esophageal reflux disease with esophagitis, without bleeding: Secondary | ICD-10-CM | POA: Diagnosis not present

## 2019-05-17 DIAGNOSIS — F17210 Nicotine dependence, cigarettes, uncomplicated: Secondary | ICD-10-CM | POA: Diagnosis not present

## 2019-05-17 DIAGNOSIS — N949 Unspecified condition associated with female genital organs and menstrual cycle: Secondary | ICD-10-CM | POA: Diagnosis not present

## 2019-05-17 DIAGNOSIS — I10 Essential (primary) hypertension: Secondary | ICD-10-CM | POA: Diagnosis not present

## 2019-05-17 DIAGNOSIS — Z8701 Personal history of pneumonia (recurrent): Secondary | ICD-10-CM | POA: Diagnosis not present

## 2019-05-27 DIAGNOSIS — R1310 Dysphagia, unspecified: Secondary | ICD-10-CM | POA: Diagnosis not present

## 2019-05-27 DIAGNOSIS — E46 Unspecified protein-calorie malnutrition: Secondary | ICD-10-CM | POA: Diagnosis not present

## 2019-05-27 DIAGNOSIS — Z434 Encounter for attention to other artificial openings of digestive tract: Secondary | ICD-10-CM | POA: Diagnosis not present

## 2019-05-27 DIAGNOSIS — I10 Essential (primary) hypertension: Secondary | ICD-10-CM | POA: Diagnosis not present

## 2019-05-27 DIAGNOSIS — J4520 Mild intermittent asthma, uncomplicated: Secondary | ICD-10-CM | POA: Diagnosis not present

## 2019-05-27 DIAGNOSIS — K861 Other chronic pancreatitis: Secondary | ICD-10-CM | POA: Diagnosis not present

## 2019-05-30 ENCOUNTER — Ambulatory Visit: Payer: Medicare Other | Admitting: Nurse Practitioner

## 2019-05-31 ENCOUNTER — Other Ambulatory Visit: Payer: Self-pay

## 2019-05-31 ENCOUNTER — Encounter: Payer: Self-pay | Admitting: Nurse Practitioner

## 2019-05-31 ENCOUNTER — Ambulatory Visit: Payer: Medicare Other | Admitting: Nurse Practitioner

## 2019-05-31 VITALS — BP 124/88 | Resp 16 | Ht 73.0 in | Wt 157.0 lb

## 2019-05-31 DIAGNOSIS — R112 Nausea with vomiting, unspecified: Secondary | ICD-10-CM | POA: Diagnosis not present

## 2019-05-31 DIAGNOSIS — Z934 Other artificial openings of gastrointestinal tract status: Secondary | ICD-10-CM | POA: Diagnosis not present

## 2019-05-31 DIAGNOSIS — R531 Weakness: Secondary | ICD-10-CM

## 2019-05-31 DIAGNOSIS — G8929 Other chronic pain: Secondary | ICD-10-CM | POA: Diagnosis not present

## 2019-05-31 DIAGNOSIS — R109 Unspecified abdominal pain: Secondary | ICD-10-CM

## 2019-05-31 MED ORDER — PROMETHAZINE HCL 25 MG PO TABS: 25.0000 mg | ORAL_TABLET | Freq: Three times a day (TID) | ORAL | 1 refills | Status: DC | PRN

## 2019-05-31 MED ORDER — FENTANYL 25 MCG/HR TD PT72: 1.0000 | MEDICATED_PATCH | TRANSDERMAL | 0 refills | Status: DC

## 2019-05-31 MED ORDER — PREGABALIN 100 MG PO CAPS: 100.0000 mg | ORAL_CAPSULE | Freq: Three times a day (TID) | ORAL | 1 refills | Status: DC

## 2019-05-31 NOTE — Progress Notes (Signed)
[facility] [place], [place] [address]  Internal MEDICINE  Office Visit Note  Patient Name: Stacy Pineda  [id number]  [id number]  Date of Service: 05/31/2019  Pt is here for recent hospital follow up.    Chief Complaint  Patient presents with  . Follow-up    pain patch, pt was at the hospital and is on a feeding tube now , was seen at [facility]  . Hypothyroidism     The patient is here for hospital follow up.  She had surgery 05/06/2019. She was hospitalized through 05/10/2019. She had to have j-tube placed. She is getting 2000 calories per day via the j-tube. She continues to have severe nausea and vomiting which was relentless. Endoscopy was good. Very little scar tissue was present from prior bariatric surgery. Does have chronic pancreatitis. Reviewed all hospital records and discharge instructions. She was given prescription for roxycodone syrup post operative. She is to take 1 tsp every 12 hours as needed for severe pain only. Fentanyl patch was continued at 40mcg/hour. Her lyrica was increased in hospital at 200mg  three times daily to help control pain. She states she is still taking only 50mg  tid. She does have a nurse coming out to her home to look her over and examine the insertion sie of j-tube. She did have follow up with bariatric surgeon on 05/26/2019 and was given a new prescription for the oxycodone syrup twice daily if needed. Plan with surgeon is to keep j-tube for a month, then stop feeding via the tube and see how she does with oral feeding. If she does ok, feeding tube will be removed. Vomiting has decreased in frequency and severity.     Current Medication: Outpatient Encounter Medications as of 05/31/2019  Medication Sig Note  . albuterol (VENTOLIN HFA) 108 (90 Base) MCG/ACT inhaler Inhale 2 puffs into the lungs every 6 (six) hours as needed for wheezing or shortness of breath.   Darlin Priestly HFA 100 MCG/ACT AERO  06/28/2015: Received from: External  Pharmacy  . clonazePAM (KLONOPIN) 1 MG tablet Take 1 mg by mouth 2 (two) times daily.   . fentaNYL (DURAGESIC) 25 MCG/HR Place 1 patch onto the skin every 3 (three) days.   . hydrOXYzine (ATARAX/VISTARIL) 10 MG tablet TAKE 1 TO 2 TABLETS BY MOUTH DAILY FOR BREAKTHOUGH ANXIETY.   Marland Kitchen levothyroxine (SYNTHROID) 25 MCG tablet Take 1 tablet (25 mcg total) by mouth daily before breakfast.   . medroxyPROGESTERone (DEPO-PROVERA) 150 MG/ML injection Inject 1 mL (150 mg total) into the muscle every 3 (three) months.   . Multiple Vitamins-Minerals (MULTIVITAMIN ADULT PO) Take 1 tablet by mouth.   . ondansetron (ZOFRAN-ODT) 8 MG disintegrating tablet Take 1 tablet (8 mg total) by mouth every 8 (eight) hours as needed for nausea or vomiting.   . potassium chloride (K-DUR) 10 MEQ tablet Take 1 tablet (10 mEq total) by mouth daily.   . prazosin (MINIPRESS) 5 MG capsule TAKE 1 CAPSULE BY MOUTH NIGHTLY   . pregabalin (LYRICA) 100 MG capsule Take 1 capsule (100 mg total) by mouth 3 (three) times daily.   . promethazine (PHENERGAN) 6.25 MG/5ML syrup TAKE  10 ML BY MOUTH EVERY 8 HOURS AS NEEDED FOR NAUSEA AND VOMITING OR  REFRACTORY  NAUSEA/VOMITING   . Ubrogepant (UBRELVY) 100 MG TABS Take 100 mg by mouth as needed.   . venlafaxine XR (EFFEXOR-XR) 150 MG 24 hr capsule TAKE 2 CAPSULES BY MOUTH IN THE MORNING   . [DISCONTINUED] fentaNYL (  DURAGESIC) 25 MCG/HR Place 1 patch onto the skin every 3 (three) days.   . [DISCONTINUED] pregabalin (LYRICA) 50 MG capsule Take 1 capsule (50 mg total) by mouth 3 (three) times daily.   . mirtazapine (REMERON) 45 MG tablet Take 1 tablet (45 mg total) by mouth at bedtime.   . promethazine (PHENERGAN) 25 MG tablet Take 1 tablet (25 mg total) by mouth every 8 (eight) hours as needed for nausea or vomiting.    No facility-administered encounter medications on file as of 05/31/2019.     Surgical History: Past Surgical History:  Procedure Laterality Date  . ABDOMINAL SURGERY    .  CHOLECYSTECTOMY    . COLONOSCOPY WITH PROPOFOL N/A 09/08/2018   Procedure: COLONOSCOPY WITH PROPOFOL;  Surgeon: Virgel Manifold, MD;  Location: [facility] ENDOSCOPY;  Service: Endoscopy;  Laterality: N/A;  . DECUBITUS ULCER EXCISION    . DILATION AND CURETTAGE OF UTERUS    . ESOPHAGOGASTRODUODENOSCOPY (EGD) WITH PROPOFOL N/A 09/08/2018   Procedure: ESOPHAGOGASTRODUODENOSCOPY (EGD) WITH PROPOFOL;  Surgeon: Virgel Manifold, MD;  Location: [facility] ENDOSCOPY;  Service: Endoscopy;  Laterality: N/A;  . GASTRIC BYPASS    . GASTRIC BYPASS    . REVISION GASTRIC RESTRICTIVE PROCEDURE FOR MORBID OBESITY      Medical History: Past Medical History:  Diagnosis Date  . Anxiety   . Asthma   . Bilateral ovarian cysts   . Chronic headaches   . Depression   . Hypothyroidism   . Pancreatitis   . Pancreatitis   . PTSD (post-traumatic stress disorder)     Family History: Family History  Problem Relation Age of Onset  . Graves' disease Mother   . Hypertension Mother   . Hyperlipidemia Mother   . Anxiety disorder Father   . Prostate cancer Father   . Depression Brother   . Anxiety disorder Brother   . Anxiety disorder Maternal Aunt   . Depression Maternal Aunt   . Anxiety disorder Paternal Aunt   . Depression Paternal Aunt   . Anxiety disorder Maternal Uncle   . Depression Maternal Uncle   . Anxiety disorder Paternal Uncle   . Depression Paternal Uncle   . Anxiety disorder Maternal Grandfather   . Depression Maternal Grandfather   . Diabetes Maternal Grandfather   . Anxiety disorder Maternal Grandmother   . Depression Maternal Grandmother   . Breast cancer Maternal Grandmother   . Ovarian cancer Maternal Grandmother   . Diabetes Maternal Grandmother   . Colon cancer Neg Hx     Social History   Socioeconomic History  . Marital status: Single    Spouse name: Not on file  . Number of children: Not on file  . Years of education: Not on file  . Highest education level: Not on file   Occupational History  . Not on file  Social Needs  . Financial resource strain: Not on file  . Food insecurity    Worry: Not on file    Inability: Not on file  . Transportation needs    Medical: Not on file    Non-medical: Not on file  Tobacco Use  . Smoking status: Current Some Day Smoker    Packs/day: 0.25    Types: Cigars, E-cigarettes, Cigarettes    Start date: 03/26/2005  . Smokeless tobacco: Never Used  Substance and Sexual Activity  . Alcohol use: No    Alcohol/week: 0.0 standard drinks  . Drug use: Yes    Frequency: 3.0 times per week  Types: Marijuana    Comment: MJ use weekly  . Sexual activity: Not Currently    Birth control/protection: Injection  Lifestyle  . Physical activity    Days per week: Not on file    Minutes per session: Not on file  . Stress: Not on file  Relationships  . Social Herbalist on phone: Not on file    Gets together: Not on file    Attends religious service: Not on file    Active member of club or organization: Not on file    Attends meetings of clubs or organizations: Not on file    Relationship status: Not on file  . Intimate partner violence    Fear of current or ex partner: Not on file    Emotionally abused: Not on file    Physically abused: Not on file    Forced sexual activity: Not on file  Other Topics Concern  . Not on file  Social History Narrative  . Not on file      Review of Systems  Constitutional: Positive for fatigue. Negative for activity change, chills, fever and unexpected weight change.       Patient had j-tube placement at end o September. Is receiving 2000 calorie diet per day.   HENT: Negative for congestion, postnasal drip, rhinorrhea, sinus pressure, sinus pain, sneezing and sore throat.   Respiratory: Negative for cough, chest tightness, shortness of breath and wheezing.   Cardiovascular: Negative for chest pain and palpitations.  Gastrointestinal: Positive for abdominal pain and nausea.  Negative for constipation, diarrhea and vomiting.       Chronic abdominal pain. Recently had j-tube placed due to chronic abdominal pain with nausea and persistent vomiting. Nausea and vomiting have lessened since she had this surgery.   Endocrine: Negative for cold intolerance, heat intolerance, polydipsia and polyuria.       Doing well on low dose levothyroxine.   Musculoskeletal: Positive for arthralgias and myalgias. Negative for back pain, joint swelling and neck pain.  Skin: Negative for rash.  Allergic/Immunologic: Positive for environmental allergies.  Neurological: Positive for headaches. Negative for tremors and numbness.  Hematological: Negative for adenopathy. Does not bruise/bleed easily.  Psychiatric/Behavioral: Positive for dysphoric mood. Negative for behavioral problems and sleep disturbance. The patient is nervous/anxious.        Sees psychiatrist .   Today's Vitals   05/31/19 1059  BP: 124/88  Resp: 16  SpO2: 97%  Weight: 157 lb (71.2 kg)  Height: 6\' 1"  (1.854 m)   Body mass index is 20.71 kg/m.  Physical Exam Vitals signs and nursing note reviewed.  Constitutional:      General: She is not in acute distress.    Appearance: Normal appearance. She is well-developed. She is not diaphoretic.  HENT:     Head: Normocephalic and atraumatic.     Mouth/Throat:     Pharynx: No oropharyngeal exudate.  Eyes:     Pupils: Pupils are equal, round, and reactive to light.  Neck:     Musculoskeletal: Normal range of motion and neck supple.     Thyroid: No thyromegaly.     Vascular: No JVD.     Trachea: No tracheal deviation.  Cardiovascular:     Rate and Rhythm: Normal rate and regular rhythm.     Heart sounds: Normal heart sounds. No murmur. No friction rub. No gallop.   Pulmonary:     Effort: Pulmonary effort is normal. No respiratory distress.  Breath sounds: Normal breath sounds. No wheezing or rales.  Chest:     Chest wall: No tenderness.  Abdominal:      General: Bowel sounds are normal.     Palpations: Abdomen is soft.     Tenderness: There is abdominal tenderness.     Comments: Generalized abdominal pain.  Musculoskeletal: Normal range of motion.  Lymphadenopathy:     Cervical: No cervical adenopathy.  Skin:    General: Skin is warm and dry.  Neurological:     Mental Status: She is alert and oriented to person, place, and time.     Cranial Nerves: No cranial nerve deficit.  Psychiatric:        Behavior: Behavior normal.        Thought Content: Thought content normal.        Judgment: Judgment normal.   Assessment/Plan: 1. Chronic abdominal pain Recent surgery to place j-tube to help with proper calorie intake to prevent further weight loss due to intolerance of oral intake. Pain more consistent. Continue fentanyl 56mcg/hour patches. Increase lyrica to 100mg  tid. Defer to surgeon to continue with oxycodone and muscle relaxer at this time.  - fentaNYL (DURAGESIC) 25 MCG/HR; Place 1 patch onto the skin every 3 (three) days.  Dispense: 10 patch; Refill: 0 - pregabalin (LYRICA) 100 MG capsule; Take 1 capsule (100 mg total) by mouth 3 (three) times daily.  Dispense: 90 capsule; Refill: 1  2. Nausea and vomiting, intractability of vomiting not specified, unspecified vomiting type Continue phenergan syrup/tablets as needed and as prescribed. Updated medication list to reflect presence of both  - promethazine (PHENERGAN) 25 MG tablet; Take 1 tablet (25 mg total) by mouth every 8 (eight) hours as needed for nausea or vomiting.  Dispense: 60 tablet; Refill: 1  3. Weakness generalized Post operative weakness present, especially in abdominal muscles. Will refer for physical therapy for further evaluation and treatment - Ambulatory referral to Physical Therapy  4. Jejunostomy tube present [facility]) Reviewed hospitalization records and progress notes from surgery and subsequent hospitalization.  Updated med list to reflect changes made at discharge.    General Counseling: blyss pogue understanding of the findings of todays visit and agrees with plan of treatment. I have discussed any further diagnostic evaluation that may be needed or ordered today. We also reviewed her medications today. she has been encouraged to call the office with any questions or concerns that should arise related to todays visit.    Counseling:  Reviewed risks and possible side effects associated with taking opiates, benzodiazepines and other CNS depressants. Combination of these could cause dizziness and drowsiness. Advised patient not to drive or operate machinery when taking these medications, as patient's and other's life can be at risk and will have consequences. Patient verbalized understanding in this matter. Dependence and abuse for these drugs will be monitored closely. A Controlled substance policy and procedure is on file which allows [facility] to order a urine drug screen test at any visit. Patient understands and agrees with the plan  This patient was seen by Leretha Pol FNP Collaboration with Dr Lavera Guise as a part of collaborative care agreement   Orders Placed This Encounter  Procedures  . Ambulatory referral to Physical Therapy      I have reviewed all medical records from hospital follow up including radiology reports and consults from other physicians. Appropriate follow up diagnostics will be scheduled as needed. Patient/ Family understands the plan of treatment. Time spent 30 minutes.  Dr Lavera Guise, MD Internal Medicine

## 2019-06-01 DIAGNOSIS — F4312 Post-traumatic stress disorder, chronic: Secondary | ICD-10-CM | POA: Diagnosis not present

## 2019-06-01 DIAGNOSIS — F410 Panic disorder [episodic paroxysmal anxiety] without agoraphobia: Secondary | ICD-10-CM | POA: Diagnosis not present

## 2019-06-01 DIAGNOSIS — F332 Major depressive disorder, recurrent severe without psychotic features: Secondary | ICD-10-CM | POA: Diagnosis not present

## 2019-06-02 DIAGNOSIS — Z434 Encounter for attention to other artificial openings of digestive tract: Secondary | ICD-10-CM | POA: Diagnosis not present

## 2019-06-02 DIAGNOSIS — J4520 Mild intermittent asthma, uncomplicated: Secondary | ICD-10-CM | POA: Diagnosis not present

## 2019-06-02 DIAGNOSIS — K861 Other chronic pancreatitis: Secondary | ICD-10-CM | POA: Diagnosis not present

## 2019-06-02 DIAGNOSIS — I10 Essential (primary) hypertension: Secondary | ICD-10-CM | POA: Diagnosis not present

## 2019-06-02 DIAGNOSIS — E46 Unspecified protein-calorie malnutrition: Secondary | ICD-10-CM | POA: Diagnosis not present

## 2019-06-02 DIAGNOSIS — R1310 Dysphagia, unspecified: Secondary | ICD-10-CM | POA: Diagnosis not present

## 2019-06-07 DIAGNOSIS — R1310 Dysphagia, unspecified: Secondary | ICD-10-CM | POA: Diagnosis not present

## 2019-06-07 DIAGNOSIS — I10 Essential (primary) hypertension: Secondary | ICD-10-CM | POA: Diagnosis not present

## 2019-06-07 DIAGNOSIS — E46 Unspecified protein-calorie malnutrition: Secondary | ICD-10-CM | POA: Diagnosis not present

## 2019-06-07 DIAGNOSIS — Z434 Encounter for attention to other artificial openings of digestive tract: Secondary | ICD-10-CM | POA: Diagnosis not present

## 2019-06-07 DIAGNOSIS — K861 Other chronic pancreatitis: Secondary | ICD-10-CM | POA: Diagnosis not present

## 2019-06-07 DIAGNOSIS — J4520 Mild intermittent asthma, uncomplicated: Secondary | ICD-10-CM | POA: Diagnosis not present

## 2019-06-08 DIAGNOSIS — I10 Essential (primary) hypertension: Secondary | ICD-10-CM | POA: Diagnosis not present

## 2019-06-08 DIAGNOSIS — J4520 Mild intermittent asthma, uncomplicated: Secondary | ICD-10-CM | POA: Diagnosis not present

## 2019-06-08 DIAGNOSIS — K861 Other chronic pancreatitis: Secondary | ICD-10-CM | POA: Diagnosis not present

## 2019-06-08 DIAGNOSIS — E46 Unspecified protein-calorie malnutrition: Secondary | ICD-10-CM | POA: Diagnosis not present

## 2019-06-08 DIAGNOSIS — Z434 Encounter for attention to other artificial openings of digestive tract: Secondary | ICD-10-CM | POA: Diagnosis not present

## 2019-06-08 DIAGNOSIS — R1310 Dysphagia, unspecified: Secondary | ICD-10-CM | POA: Diagnosis not present

## 2019-06-15 DIAGNOSIS — I10 Essential (primary) hypertension: Secondary | ICD-10-CM | POA: Diagnosis not present

## 2019-06-15 DIAGNOSIS — J4520 Mild intermittent asthma, uncomplicated: Secondary | ICD-10-CM | POA: Diagnosis not present

## 2019-06-15 DIAGNOSIS — R1310 Dysphagia, unspecified: Secondary | ICD-10-CM | POA: Diagnosis not present

## 2019-06-15 DIAGNOSIS — Z434 Encounter for attention to other artificial openings of digestive tract: Secondary | ICD-10-CM | POA: Diagnosis not present

## 2019-06-15 DIAGNOSIS — E46 Unspecified protein-calorie malnutrition: Secondary | ICD-10-CM | POA: Diagnosis not present

## 2019-06-15 DIAGNOSIS — K861 Other chronic pancreatitis: Secondary | ICD-10-CM | POA: Diagnosis not present

## 2019-06-28 ENCOUNTER — Ambulatory Visit: Payer: Medicare Other | Admitting: Nurse Practitioner

## 2019-07-01 ENCOUNTER — Telehealth: Payer: Self-pay

## 2019-07-01 NOTE — Telephone Encounter (Signed)
Called lmom informing patient of appointment. klh 

## 2019-07-05 ENCOUNTER — Other Ambulatory Visit: Payer: Self-pay

## 2019-07-05 ENCOUNTER — Ambulatory Visit: Payer: Medicare Other | Admitting: Adult Health

## 2019-07-06 ENCOUNTER — Telehealth: Payer: Self-pay

## 2019-07-06 NOTE — Telephone Encounter (Signed)
[facility] CALLED TO GET A VERBAL OK TO DISCHARGE PATIENT NEXT WEEK FOR NURSING. GAVE VERBAL OK PER NIMISHA.

## 2019-07-11 ENCOUNTER — Other Ambulatory Visit: Payer: Self-pay

## 2019-07-11 DIAGNOSIS — J4520 Mild intermittent asthma, uncomplicated: Secondary | ICD-10-CM

## 2019-07-11 DIAGNOSIS — E039 Hypothyroidism, unspecified: Secondary | ICD-10-CM

## 2019-07-11 MED ORDER — LEVOTHYROXINE SODIUM 25 MCG PO TABS: 25.0000 ug | ORAL_TABLET | Freq: Every day | ORAL | 3 refills | Status: DC

## 2019-07-11 MED ORDER — ALBUTEROL SULFATE HFA 108 (90 BASE) MCG/ACT IN AERS: 2.0000 | INHALATION_SPRAY | Freq: Four times a day (QID) | RESPIRATORY_TRACT | 1 refills | Status: DC | PRN

## 2019-07-14 DIAGNOSIS — Z20828 Contact with and (suspected) exposure to other viral communicable diseases: Secondary | ICD-10-CM | POA: Diagnosis not present

## 2019-07-15 ENCOUNTER — Ambulatory Visit: Payer: Medicare Other

## 2019-07-18 ENCOUNTER — Telehealth: Payer: Self-pay

## 2019-07-18 NOTE — Telephone Encounter (Signed)
Confirmed appointment with patient. klh °

## 2019-07-18 NOTE — Telephone Encounter (Signed)
Called lmom informing patient of appointment. klh 

## 2019-07-20 ENCOUNTER — Encounter: Payer: Self-pay | Admitting: Adult Health

## 2019-07-20 ENCOUNTER — Other Ambulatory Visit: Payer: Self-pay

## 2019-07-20 ENCOUNTER — Ambulatory Visit: Payer: Medicare Other | Admitting: Adult Health

## 2019-07-20 ENCOUNTER — Ambulatory Visit: Payer: Medicare Other

## 2019-07-20 VITALS — BP 135/100 | HR 100 | Temp 97.6°F | Resp 16 | Ht 73.0 in | Wt 157.0 lb

## 2019-07-20 DIAGNOSIS — G8929 Other chronic pain: Secondary | ICD-10-CM

## 2019-07-20 DIAGNOSIS — F4310 Post-traumatic stress disorder, unspecified: Secondary | ICD-10-CM

## 2019-07-20 DIAGNOSIS — F411 Generalized anxiety disorder: Secondary | ICD-10-CM | POA: Diagnosis not present

## 2019-07-20 DIAGNOSIS — E039 Hypothyroidism, unspecified: Secondary | ICD-10-CM | POA: Diagnosis not present

## 2019-07-20 DIAGNOSIS — R109 Unspecified abdominal pain: Secondary | ICD-10-CM

## 2019-07-20 DIAGNOSIS — E876 Hypokalemia: Secondary | ICD-10-CM

## 2019-07-20 DIAGNOSIS — Z934 Other artificial openings of gastrointestinal tract status: Secondary | ICD-10-CM | POA: Diagnosis not present

## 2019-07-20 MED ORDER — FENTANYL 25 MCG/HR TD PT72: 1.0000 | MEDICATED_PATCH | TRANSDERMAL | 0 refills | Status: DC

## 2019-07-20 NOTE — Progress Notes (Signed)
[facility] [place], [place] [address]  Internal MEDICINE  Office Visit Note  Patient Name: Stacy Pineda  [id number]  [id number]  Date of Service: 07/20/2019  Chief Complaint  Patient presents with  . Anxiety  . Depression  . Hypothyroidism    HPI  Pt is here for follow up on anxiety, depression, and hypothyroid.  She reports overall she is ok.  She continues to have daily nausea.  She had a J tube until last week when it accidentally fell out. She has an appt to have it reinserted on 07/27/19.  She reports she was intermittently using the tube when she had trouble eating.  She continues to use fentanyl Duragesic patches for pain.        Current Medication: Outpatient Encounter Medications as of 07/20/2019  Medication Sig Note  . albuterol (VENTOLIN HFA) 108 (90 Base) MCG/ACT inhaler Inhale 2 puffs into the lungs every 6 (six) hours as needed for wheezing or shortness of breath.   Darlin Priestly HFA 100 MCG/ACT AERO  06/28/2015: Received from: External Pharmacy  . clonazePAM (KLONOPIN) 1 MG tablet Take 1 mg by mouth 2 (two) times daily.   . fentaNYL (DURAGESIC) 25 MCG/HR Place 1 patch onto the skin every 3 (three) days.   . hydrOXYzine (ATARAX/VISTARIL) 10 MG tablet TAKE 1 TO 2 TABLETS BY MOUTH DAILY FOR BREAKTHOUGH ANXIETY.   Marland Kitchen levothyroxine (SYNTHROID) 25 MCG tablet Take 1 tablet (25 mcg total) by mouth daily before breakfast.   . medroxyPROGESTERone (DEPO-PROVERA) 150 MG/ML injection Inject 1 mL (150 mg total) into the muscle every 3 (three) months.   . Multiple Vitamins-Minerals (MULTIVITAMIN ADULT PO) Take 1 tablet by mouth.   . ondansetron (ZOFRAN-ODT) 8 MG disintegrating tablet Take 1 tablet (8 mg total) by mouth every 8 (eight) hours as needed for nausea or vomiting.   . potassium chloride (K-DUR) 10 MEQ tablet Take 1 tablet (10 mEq total) by mouth daily.   . prazosin (MINIPRESS) 5 MG capsule TAKE 1 CAPSULE BY MOUTH NIGHTLY   . pregabalin (LYRICA) 100  MG capsule Take 1 capsule (100 mg total) by mouth 3 (three) times daily.   . promethazine (PHENERGAN) 25 MG tablet Take 1 tablet (25 mg total) by mouth every 8 (eight) hours as needed for nausea or vomiting.   . promethazine (PHENERGAN) 6.25 MG/5ML syrup TAKE  10 ML BY MOUTH EVERY 8 HOURS AS NEEDED FOR NAUSEA AND VOMITING OR  REFRACTORY  NAUSEA/VOMITING   . Ubrogepant (UBRELVY) 100 MG TABS Take 100 mg by mouth as needed.   . venlafaxine XR (EFFEXOR-XR) 150 MG 24 hr capsule TAKE 2 CAPSULES BY MOUTH IN THE MORNING   . [DISCONTINUED] fentaNYL (DURAGESIC) 25 MCG/HR Place 1 patch onto the skin every 3 (three) days.   . mirtazapine (REMERON) 45 MG tablet Take 1 tablet (45 mg total) by mouth at bedtime.    No facility-administered encounter medications on file as of 07/20/2019.     Surgical History: Past Surgical History:  Procedure Laterality Date  . ABDOMINAL SURGERY    . CHOLECYSTECTOMY    . COLONOSCOPY WITH PROPOFOL N/A 09/08/2018   Procedure: COLONOSCOPY WITH PROPOFOL;  Surgeon: Virgel Manifold, MD;  Location: [facility] ENDOSCOPY;  Service: Endoscopy;  Laterality: N/A;  . DECUBITUS ULCER EXCISION    . DILATION AND CURETTAGE OF UTERUS    . ESOPHAGOGASTRODUODENOSCOPY (EGD) WITH PROPOFOL N/A 09/08/2018   Procedure: ESOPHAGOGASTRODUODENOSCOPY (EGD) WITH PROPOFOL;  Surgeon: Virgel Manifold, MD;  Location:  [facility];  Service: Endoscopy;  Laterality: N/A;  . GASTRIC BYPASS    . GASTRIC BYPASS    . REVISION GASTRIC RESTRICTIVE PROCEDURE FOR MORBID OBESITY      Medical History: Past Medical History:  Diagnosis Date  . Anxiety   . Asthma   . Bilateral ovarian cysts   . Chronic headaches   . Depression   . Hypothyroidism   . Pancreatitis   . Pancreatitis   . PTSD (post-traumatic stress disorder)     Family History: Family History  Problem Relation Age of Onset  . Graves' disease Mother   . Hypertension Mother   . Hyperlipidemia Mother   . Anxiety disorder Father   .  Prostate cancer Father   . Depression Brother   . Anxiety disorder Brother   . Anxiety disorder Maternal Aunt   . Depression Maternal Aunt   . Anxiety disorder Paternal Aunt   . Depression Paternal Aunt   . Anxiety disorder Maternal Uncle   . Depression Maternal Uncle   . Anxiety disorder Paternal Uncle   . Depression Paternal Uncle   . Anxiety disorder Maternal Grandfather   . Depression Maternal Grandfather   . Diabetes Maternal Grandfather   . Anxiety disorder Maternal Grandmother   . Depression Maternal Grandmother   . Breast cancer Maternal Grandmother   . Ovarian cancer Maternal Grandmother   . Diabetes Maternal Grandmother   . Colon cancer Neg Hx     Social History   Socioeconomic History  . Marital status: Single    Spouse name: Not on file  . Number of children: Not on file  . Years of education: Not on file  . Highest education level: Not on file  Occupational History  . Not on file  Social Needs  . Financial resource strain: Not on file  . Food insecurity    Worry: Not on file    Inability: Not on file  . Transportation needs    Medical: Not on file    Non-medical: Not on file  Tobacco Use  . Smoking status: Current Some Day Smoker    Packs/day: 0.25    Types: Cigars, E-cigarettes, Cigarettes    Start date: 03/26/2005  . Smokeless tobacco: Never Used  Substance and Sexual Activity  . Alcohol use: No    Alcohol/week: 0.0 standard drinks  . Drug use: Yes    Frequency: 3.0 times per week    Types: Marijuana    Comment: MJ use weekly  . Sexual activity: Not Currently    Birth control/protection: Injection  Lifestyle  . Physical activity    Days per week: Not on file    Minutes per session: Not on file  . Stress: Not on file  Relationships  . Social Herbalist on phone: Not on file    Gets together: Not on file    Attends religious service: Not on file    Active member of club or organization: Not on file    Attends meetings of clubs or  organizations: Not on file    Relationship status: Not on file  . Intimate partner violence    Fear of current or ex partner: Not on file    Emotionally abused: Not on file    Physically abused: Not on file    Forced sexual activity: Not on file  Other Topics Concern  . Not on file  Social History Narrative  . Not on file      Review of  Systems  Constitutional: Negative for chills, fatigue and unexpected weight change.  HENT: Negative for congestion, rhinorrhea, sneezing and sore throat.   Eyes: Negative for photophobia, pain and redness.  Respiratory: Negative for cough, chest tightness and shortness of breath.   Cardiovascular: Negative for chest pain and palpitations.  Gastrointestinal: Negative for abdominal pain, constipation, diarrhea, nausea and vomiting.  Endocrine: Negative.   Genitourinary: Negative for dysuria and frequency.  Musculoskeletal: Negative for arthralgias, back pain, joint swelling and neck pain.  Skin: Negative for rash.  Allergic/Immunologic: Negative.   Neurological: Negative for tremors and numbness.  Hematological: Negative for adenopathy. Does not bruise/bleed easily.  Psychiatric/Behavioral: Negative for behavioral problems and sleep disturbance. The patient is not nervous/anxious.     Vital Signs: BP (!) 135/100   Pulse 100   Temp 97.6 F (36.4 C)   Resp 16   Ht 6\' 1"  (1.854 m)   Wt 157 lb (71.2 kg)   SpO2 100%   BMI 20.71 kg/m    Physical Exam Vitals signs and nursing note reviewed.  Constitutional:      General: She is not in acute distress.    Appearance: She is well-developed. She is not diaphoretic.  HENT:     Head: Normocephalic and atraumatic.     Mouth/Throat:     Pharynx: No oropharyngeal exudate.  Eyes:     Pupils: Pupils are equal, round, and reactive to light.  Neck:     Musculoskeletal: Normal range of motion and neck supple.     Thyroid: No thyromegaly.     Vascular: No JVD.     Trachea: No tracheal deviation.   Cardiovascular:     Rate and Rhythm: Normal rate and regular rhythm.     Heart sounds: Normal heart sounds. No murmur. No friction rub. No gallop.   Pulmonary:     Effort: Pulmonary effort is normal. No respiratory distress.     Breath sounds: Normal breath sounds. No wheezing or rales.  Chest:     Chest wall: No tenderness.  Abdominal:     Palpations: Abdomen is soft.     Tenderness: There is no abdominal tenderness. There is no guarding.  Musculoskeletal: Normal range of motion.  Lymphadenopathy:     Cervical: No cervical adenopathy.  Skin:    General: Skin is warm and dry.  Neurological:     Mental Status: She is alert and oriented to person, place, and time.     Cranial Nerves: No cranial nerve deficit.  Psychiatric:        Behavior: Behavior normal.        Thought Content: Thought content normal.        Judgment: Judgment normal.     Assessment/Plan: 1. Chronic abdominal pain Reviewed risks and possible side effects associated with taking opiates, benzodiazepines and other CNS depressants. Combination of these could cause dizziness and drowsiness. Advised patient not to drive or operate machinery when taking these medications, as patient's and other's life can be at risk and will have consequences. Patient verbalized understanding in this matter. Dependence and abuse for these drugs will be monitored closely. A Controlled substance policy and procedure is on file which allows [facility] to order a urine drug screen test at any visit. Patient understands and agrees with the plan - fentaNYL (DURAGESIC) 25 MCG/HR; Place 1 patch onto the skin every 3 (three) days.  Dispense: 10 patch; Refill: 0  2. Acquired hypothyroidism Recheck TSh and Free T4 - TSH + free  T4  3. Hypokalemia Recheck metabolic  - Basic Metabolic Panel (BMET)  4. Jejunostomy tube present ([place]) Follow up for replacement as scheduled.  5. PTSD (post-traumatic stress disorder) Stable, pt is  able to verbalize good control of symptoms.   6. GAD (generalized anxiety disorder) Controlled per patient.   General Counseling: zelma zahler understanding of the findings of todays visit and agrees with plan of treatment. I have discussed any further diagnostic evaluation that may be needed or ordered today. We also reviewed her medications today. she has been encouraged to call the office with any questions or concerns that should arise related to todays visit.    Orders Placed This Encounter  Procedures  . Basic Metabolic Panel (BMET)  . TSH + free T4    Meds ordered this encounter  Medications  . fentaNYL (DURAGESIC) 25 MCG/HR    Sig: Place 1 patch onto the skin every 3 (three) days.    Dispense:  10 patch    Refill:  0    Time spent: 25 Minutes   This patient was seen by Orson Gear AGNP-C in Collaboration with Dr Lavera Guise as a part of collaborative care agreement     Kendell Bane AGNP-C Internal medicine

## 2019-07-22 ENCOUNTER — Other Ambulatory Visit: Payer: Self-pay

## 2019-07-22 ENCOUNTER — Telehealth: Payer: Self-pay

## 2019-07-22 ENCOUNTER — Ambulatory Visit (INDEPENDENT_AMBULATORY_CARE_PROVIDER_SITE_OTHER): Payer: Medicare Other | Admitting: Obstetrics and Gynecology

## 2019-07-22 VITALS — BP 120/93 | HR 90 | Ht 73.0 in | Wt 151.8 lb

## 2019-07-22 DIAGNOSIS — Z3042 Encounter for surveillance of injectable contraceptive: Secondary | ICD-10-CM | POA: Diagnosis not present

## 2019-07-22 MED ORDER — MEDROXYPROGESTERONE ACETATE 150 MG/ML IM SUSP
150.0000 mg | Freq: Once | INTRAMUSCULAR | Status: AC
  Administered 2019-07-22: 150 mg via INTRAMUSCULAR

## 2019-07-22 MED ORDER — MEDROXYPROGESTERONE ACETATE 150 MG/ML IM SUSP: 150.0000 mg | INTRAMUSCULAR | 0 refills | Status: DC

## 2019-07-22 NOTE — Telephone Encounter (Signed)
Confirmed appointment with patient. klh °

## 2019-07-22 NOTE — Progress Notes (Signed)
Date last pap: 03/30/2015 (Negative/Negative). Last Depo-Provera: 04/25/2019. Side Effects if any: None per patient. Serum HCG indicated? N/A, patient is within dates. Depo-Provera 150 mg IM given by: Jennye Moccasin, CMA. Next appointment due 2/26-3/07/2020.    Patient requested refill be sent for Depo, prescription sent with no refills.  Patient due for AE 10/2019 and will schedule.  Patient aware additional refills will be given at her AE appointment and she verbalized understanding.  Patient states "I've been running around today that's why my BP is a little elevated".    BP (!) 120/93   Pulse 90   Ht 6\' 1"  (1.854 m)   Wt 151 lb 12.8 oz (68.9 kg)   LMP  (LMP Unknown)   BMI 20.03 kg/m

## 2019-07-22 NOTE — Telephone Encounter (Signed)
Rescheduled appointment on 07/26/2019 to 08/15/2018. klh

## 2019-07-23 DIAGNOSIS — Z3042 Encounter for surveillance of injectable contraceptive: Secondary | ICD-10-CM | POA: Insufficient documentation

## 2019-07-25 DIAGNOSIS — Z01818 Encounter for other preprocedural examination: Secondary | ICD-10-CM | POA: Diagnosis not present

## 2019-07-26 ENCOUNTER — Ambulatory Visit: Payer: Self-pay | Admitting: Adult Health

## 2019-07-27 DIAGNOSIS — K9413 Enterostomy malfunction: Secondary | ICD-10-CM | POA: Diagnosis not present

## 2019-07-27 DIAGNOSIS — Z934 Other artificial openings of gastrointestinal tract status: Secondary | ICD-10-CM | POA: Diagnosis not present

## 2019-08-01 DIAGNOSIS — Z79899 Other long term (current) drug therapy: Secondary | ICD-10-CM | POA: Diagnosis not present

## 2019-08-02 DIAGNOSIS — F411 Generalized anxiety disorder: Secondary | ICD-10-CM | POA: Diagnosis not present

## 2019-08-02 DIAGNOSIS — R112 Nausea with vomiting, unspecified: Secondary | ICD-10-CM | POA: Diagnosis not present

## 2019-08-02 DIAGNOSIS — I10 Essential (primary) hypertension: Secondary | ICD-10-CM | POA: Diagnosis not present

## 2019-08-02 DIAGNOSIS — K859 Acute pancreatitis without necrosis or infection, unspecified: Secondary | ICD-10-CM | POA: Diagnosis not present

## 2019-08-02 DIAGNOSIS — Z452 Encounter for adjustment and management of vascular access device: Secondary | ICD-10-CM | POA: Diagnosis not present

## 2019-08-02 DIAGNOSIS — G8929 Other chronic pain: Secondary | ICD-10-CM | POA: Diagnosis not present

## 2019-08-02 DIAGNOSIS — Z79891 Long term (current) use of opiate analgesic: Secondary | ICD-10-CM | POA: Diagnosis not present

## 2019-08-02 DIAGNOSIS — Z9181 History of falling: Secondary | ICD-10-CM | POA: Diagnosis not present

## 2019-08-02 DIAGNOSIS — Z79899 Other long term (current) drug therapy: Secondary | ICD-10-CM | POA: Diagnosis not present

## 2019-08-02 DIAGNOSIS — G43011 Migraine without aura, intractable, with status migrainosus: Secondary | ICD-10-CM | POA: Diagnosis not present

## 2019-08-02 DIAGNOSIS — K289 Gastrojejunal ulcer, unspecified as acute or chronic, without hemorrhage or perforation: Secondary | ICD-10-CM | POA: Diagnosis not present

## 2019-08-02 DIAGNOSIS — Z9884 Bariatric surgery status: Secondary | ICD-10-CM | POA: Diagnosis not present

## 2019-08-02 DIAGNOSIS — K529 Noninfective gastroenteritis and colitis, unspecified: Secondary | ICD-10-CM | POA: Diagnosis not present

## 2019-08-02 DIAGNOSIS — Z86718 Personal history of other venous thrombosis and embolism: Secondary | ICD-10-CM | POA: Diagnosis not present

## 2019-08-02 DIAGNOSIS — Z5181 Encounter for therapeutic drug level monitoring: Secondary | ICD-10-CM | POA: Diagnosis not present

## 2019-08-02 DIAGNOSIS — D509 Iron deficiency anemia, unspecified: Secondary | ICD-10-CM | POA: Diagnosis not present

## 2019-08-02 DIAGNOSIS — J4520 Mild intermittent asthma, uncomplicated: Secondary | ICD-10-CM | POA: Diagnosis not present

## 2019-08-02 DIAGNOSIS — R197 Diarrhea, unspecified: Secondary | ICD-10-CM | POA: Diagnosis not present

## 2019-08-02 DIAGNOSIS — R1310 Dysphagia, unspecified: Secondary | ICD-10-CM | POA: Diagnosis not present

## 2019-08-02 DIAGNOSIS — E46 Unspecified protein-calorie malnutrition: Secondary | ICD-10-CM | POA: Diagnosis not present

## 2019-08-02 DIAGNOSIS — K861 Other chronic pancreatitis: Secondary | ICD-10-CM | POA: Diagnosis not present

## 2019-08-04 DIAGNOSIS — K289 Gastrojejunal ulcer, unspecified as acute or chronic, without hemorrhage or perforation: Secondary | ICD-10-CM | POA: Diagnosis not present

## 2019-08-04 DIAGNOSIS — G8929 Other chronic pain: Secondary | ICD-10-CM | POA: Diagnosis not present

## 2019-08-04 DIAGNOSIS — I10 Essential (primary) hypertension: Secondary | ICD-10-CM | POA: Diagnosis not present

## 2019-08-04 DIAGNOSIS — K861 Other chronic pancreatitis: Secondary | ICD-10-CM | POA: Diagnosis not present

## 2019-08-04 DIAGNOSIS — Z452 Encounter for adjustment and management of vascular access device: Secondary | ICD-10-CM | POA: Diagnosis not present

## 2019-08-04 DIAGNOSIS — E46 Unspecified protein-calorie malnutrition: Secondary | ICD-10-CM | POA: Diagnosis not present

## 2019-08-07 DIAGNOSIS — K289 Gastrojejunal ulcer, unspecified as acute or chronic, without hemorrhage or perforation: Secondary | ICD-10-CM | POA: Diagnosis not present

## 2019-08-07 DIAGNOSIS — K861 Other chronic pancreatitis: Secondary | ICD-10-CM | POA: Diagnosis not present

## 2019-08-07 DIAGNOSIS — K859 Acute pancreatitis without necrosis or infection, unspecified: Secondary | ICD-10-CM | POA: Diagnosis not present

## 2019-08-07 DIAGNOSIS — I10 Essential (primary) hypertension: Secondary | ICD-10-CM | POA: Diagnosis not present

## 2019-08-07 DIAGNOSIS — R197 Diarrhea, unspecified: Secondary | ICD-10-CM | POA: Diagnosis not present

## 2019-08-07 DIAGNOSIS — R112 Nausea with vomiting, unspecified: Secondary | ICD-10-CM | POA: Diagnosis not present

## 2019-08-07 DIAGNOSIS — G8929 Other chronic pain: Secondary | ICD-10-CM | POA: Diagnosis not present

## 2019-08-07 DIAGNOSIS — E46 Unspecified protein-calorie malnutrition: Secondary | ICD-10-CM | POA: Diagnosis not present

## 2019-08-07 DIAGNOSIS — Z452 Encounter for adjustment and management of vascular access device: Secondary | ICD-10-CM | POA: Diagnosis not present

## 2019-08-11 ENCOUNTER — Telehealth: Payer: Self-pay

## 2019-08-11 DIAGNOSIS — K909 Intestinal malabsorption, unspecified: Secondary | ICD-10-CM | POA: Diagnosis not present

## 2019-08-11 DIAGNOSIS — R112 Nausea with vomiting, unspecified: Secondary | ICD-10-CM | POA: Diagnosis not present

## 2019-08-11 DIAGNOSIS — K219 Gastro-esophageal reflux disease without esophagitis: Secondary | ICD-10-CM | POA: Diagnosis not present

## 2019-08-11 DIAGNOSIS — R197 Diarrhea, unspecified: Secondary | ICD-10-CM | POA: Diagnosis not present

## 2019-08-11 NOTE — Telephone Encounter (Signed)
CONFIRMED AND SCREENED FOR 08-16-19 OV. 

## 2019-08-12 DIAGNOSIS — I10 Essential (primary) hypertension: Secondary | ICD-10-CM | POA: Diagnosis not present

## 2019-08-12 DIAGNOSIS — Z452 Encounter for adjustment and management of vascular access device: Secondary | ICD-10-CM | POA: Diagnosis not present

## 2019-08-12 DIAGNOSIS — D509 Iron deficiency anemia, unspecified: Secondary | ICD-10-CM | POA: Diagnosis not present

## 2019-08-12 DIAGNOSIS — K861 Other chronic pancreatitis: Secondary | ICD-10-CM | POA: Diagnosis not present

## 2019-08-12 DIAGNOSIS — K289 Gastrojejunal ulcer, unspecified as acute or chronic, without hemorrhage or perforation: Secondary | ICD-10-CM | POA: Diagnosis not present

## 2019-08-12 DIAGNOSIS — E46 Unspecified protein-calorie malnutrition: Secondary | ICD-10-CM | POA: Diagnosis not present

## 2019-08-12 DIAGNOSIS — G8929 Other chronic pain: Secondary | ICD-10-CM | POA: Diagnosis not present

## 2019-08-15 DIAGNOSIS — R197 Diarrhea, unspecified: Secondary | ICD-10-CM | POA: Diagnosis not present

## 2019-08-15 DIAGNOSIS — K859 Acute pancreatitis without necrosis or infection, unspecified: Secondary | ICD-10-CM | POA: Diagnosis not present

## 2019-08-15 DIAGNOSIS — G8929 Other chronic pain: Secondary | ICD-10-CM | POA: Diagnosis not present

## 2019-08-15 DIAGNOSIS — R112 Nausea with vomiting, unspecified: Secondary | ICD-10-CM | POA: Diagnosis not present

## 2019-08-15 DIAGNOSIS — K861 Other chronic pancreatitis: Secondary | ICD-10-CM | POA: Diagnosis not present

## 2019-08-15 DIAGNOSIS — I10 Essential (primary) hypertension: Secondary | ICD-10-CM | POA: Diagnosis not present

## 2019-08-15 DIAGNOSIS — K289 Gastrojejunal ulcer, unspecified as acute or chronic, without hemorrhage or perforation: Secondary | ICD-10-CM | POA: Diagnosis not present

## 2019-08-15 DIAGNOSIS — Z452 Encounter for adjustment and management of vascular access device: Secondary | ICD-10-CM | POA: Diagnosis not present

## 2019-08-15 DIAGNOSIS — E46 Unspecified protein-calorie malnutrition: Secondary | ICD-10-CM | POA: Diagnosis not present

## 2019-08-16 ENCOUNTER — Ambulatory Visit: Payer: Medicare Other | Admitting: Adult Health

## 2019-08-16 ENCOUNTER — Encounter: Payer: Self-pay | Admitting: Adult Health

## 2019-08-16 ENCOUNTER — Other Ambulatory Visit: Payer: Self-pay

## 2019-08-16 VITALS — BP 120/90 | HR 100 | Temp 97.6°F | Resp 16 | Ht 73.0 in | Wt 158.8 lb

## 2019-08-16 DIAGNOSIS — E039 Hypothyroidism, unspecified: Secondary | ICD-10-CM

## 2019-08-16 DIAGNOSIS — G43809 Other migraine, not intractable, without status migrainosus: Secondary | ICD-10-CM | POA: Diagnosis not present

## 2019-08-16 DIAGNOSIS — F411 Generalized anxiety disorder: Secondary | ICD-10-CM | POA: Diagnosis not present

## 2019-08-16 DIAGNOSIS — R109 Unspecified abdominal pain: Secondary | ICD-10-CM | POA: Diagnosis not present

## 2019-08-16 DIAGNOSIS — G8929 Other chronic pain: Secondary | ICD-10-CM | POA: Diagnosis not present

## 2019-08-16 DIAGNOSIS — R112 Nausea with vomiting, unspecified: Secondary | ICD-10-CM

## 2019-08-16 DIAGNOSIS — J4520 Mild intermittent asthma, uncomplicated: Secondary | ICD-10-CM

## 2019-08-16 MED ORDER — FENTANYL 25 MCG/HR TD PT72: 1.0000 | MEDICATED_PATCH | TRANSDERMAL | 0 refills | Status: DC

## 2019-08-16 MED ORDER — LEVOTHYROXINE SODIUM 25 MCG PO TABS: 25.0000 ug | ORAL_TABLET | Freq: Every day | ORAL | 1 refills | Status: DC

## 2019-08-16 MED ORDER — ALBUTEROL SULFATE (2.5 MG/3ML) 0.083% IN NEBU: 2.5000 mg | INHALATION_SOLUTION | Freq: Four times a day (QID) | RESPIRATORY_TRACT | 1 refills | Status: DC | PRN

## 2019-08-16 MED ORDER — PREGABALIN 100 MG PO CAPS: 100.0000 mg | ORAL_CAPSULE | Freq: Three times a day (TID) | ORAL | 1 refills | Status: DC

## 2019-08-16 NOTE — Progress Notes (Signed)
[facility] [place], [place] [address]  Internal MEDICINE  Office Visit Note  Patient Name: Stacy Pineda  [id number]  [id number]  Date of Service: 08/16/2019  Chief Complaint  Patient presents with  . Anxiety  . Depression  . Hypothyroidism    HPI  Pt is here for follow up on Anxiety, depression and hypothyroid. She is at her baseline.  Since our last visit, she has had a PICC line placed for TPN infusions.  Her Peg tube fell out and was unable to be reinserted.  She has a nurse coming to her house to change her PICC dressing weekly.  She is currently getting TPN infusion for 24 hour a day, but she tapers down to 18 hours a day this week.  It will continue to taper and they believe she will not need TPN after 3-4 more weeks. Her depression and Hypothyroid are controled.  She does need an AWV which she is willing to do after her tpn is completed.    Current Medication: Outpatient Encounter Medications as of 08/16/2019  Medication Sig Note  . albuterol (VENTOLIN HFA) 108 (90 Base) MCG/ACT inhaler Inhale 2 puffs into the lungs every 6 (six) hours as needed for wheezing or shortness of breath.   Darlin Priestly HFA 100 MCG/ACT AERO Inhale 2 puffs into the lungs daily.  06/28/2015: Received from: External Pharmacy  . clonazePAM (KLONOPIN) 1 MG tablet Take 1 mg by mouth 2 (two) times daily.   . fentaNYL (DURAGESIC) 25 MCG/HR Place 1 patch onto the skin every 3 (three) days.   Marland Kitchen levothyroxine (SYNTHROID) 25 MCG tablet Take 1 tablet (25 mcg total) by mouth daily before breakfast.   . medroxyPROGESTERone (DEPO-PROVERA) 150 MG/ML injection Inject 1 mL (150 mg total) into the muscle every 3 (three) months.   . Multiple Vitamins-Minerals (MULTIVITAMIN ADULT PO) Take 1 tablet by mouth.   . ondansetron (ZOFRAN-ODT) 8 MG disintegrating tablet Take 1 tablet (8 mg total) by mouth every 8 (eight) hours as needed for nausea or vomiting.   . potassium chloride (K-DUR) 10 MEQ tablet Take  1 tablet (10 mEq total) by mouth daily.   . prazosin (MINIPRESS) 5 MG capsule TAKE 1 CAPSULE BY MOUTH NIGHTLY   . pregabalin (LYRICA) 100 MG capsule Take 1 capsule (100 mg total) by mouth 3 (three) times daily.   . promethazine (PHENERGAN) 25 MG tablet Take 1 tablet (25 mg total) by mouth every 8 (eight) hours as needed for nausea or vomiting.   . promethazine (PHENERGAN) 6.25 MG/5ML syrup TAKE  10 ML BY MOUTH EVERY 8 HOURS AS NEEDED FOR NAUSEA AND VOMITING OR  REFRACTORY  NAUSEA/VOMITING   . venlafaxine XR (EFFEXOR-XR) 150 MG 24 hr capsule TAKE 2 CAPSULES BY MOUTH IN THE MORNING   . mirtazapine (REMERON) 45 MG tablet Take 1 tablet (45 mg total) by mouth at bedtime.   Marland Kitchen Ubrogepant (UBRELVY) 100 MG TABS Take 100 mg by mouth as needed.    No facility-administered encounter medications on file as of 08/16/2019.    Surgical History: Past Surgical History:  Procedure Laterality Date  . ABDOMINAL SURGERY    . CHOLECYSTECTOMY    . COLONOSCOPY WITH PROPOFOL N/A 09/08/2018   Procedure: COLONOSCOPY WITH PROPOFOL;  Surgeon: Virgel Manifold, MD;  Location: [facility] ENDOSCOPY;  Service: Endoscopy;  Laterality: N/A;  . DECUBITUS ULCER EXCISION    . DILATION AND CURETTAGE OF UTERUS    . ESOPHAGOGASTRODUODENOSCOPY (EGD) WITH PROPOFOL N/A 09/08/2018  Procedure: ESOPHAGOGASTRODUODENOSCOPY (EGD) WITH PROPOFOL;  Surgeon: Virgel Manifold, MD;  Location: [facility] ENDOSCOPY;  Service: Endoscopy;  Laterality: N/A;  . GASTRIC BYPASS    . GASTRIC BYPASS    . REVISION GASTRIC RESTRICTIVE PROCEDURE FOR MORBID OBESITY      Medical History: Past Medical History:  Diagnosis Date  . Anxiety   . Asthma   . Bilateral ovarian cysts   . Chronic headaches   . Depression   . Hypothyroidism   . Pancreatitis   . Pancreatitis   . PTSD (post-traumatic stress disorder)     Family History: Family History  Problem Relation Age of Onset  . Graves' disease Mother   . Hypertension Mother   . Hyperlipidemia Mother   .  Anxiety disorder Father   . Prostate cancer Father   . Depression Brother   . Anxiety disorder Brother   . Anxiety disorder Maternal Aunt   . Depression Maternal Aunt   . Anxiety disorder Paternal Aunt   . Depression Paternal Aunt   . Anxiety disorder Maternal Uncle   . Depression Maternal Uncle   . Anxiety disorder Paternal Uncle   . Depression Paternal Uncle   . Anxiety disorder Maternal Grandfather   . Depression Maternal Grandfather   . Diabetes Maternal Grandfather   . Anxiety disorder Maternal Grandmother   . Depression Maternal Grandmother   . Breast cancer Maternal Grandmother   . Ovarian cancer Maternal Grandmother   . Diabetes Maternal Grandmother   . Colon cancer Neg Hx     Social History   Socioeconomic History  . Marital status: Single    Spouse name: Not on file  . Number of children: Not on file  . Years of education: Not on file  . Highest education level: Not on file  Occupational History  . Not on file  Tobacco Use  . Smoking status: Current Some Day Smoker    Packs/day: 0.25    Types: Cigars, E-cigarettes, Cigarettes    Start date: 03/26/2005  . Smokeless tobacco: Never Used  Substance and Sexual Activity  . Alcohol use: No    Alcohol/week: 0.0 standard drinks  . Drug use: Yes    Frequency: 3.0 times per week    Types: Marijuana    Comment: MJ use weekly  . Sexual activity: Not Currently    Birth control/protection: Injection  Other Topics Concern  . Not on file  Social History Narrative  . Not on file   Social Determinants of Health   Financial Resource Strain:   . Difficulty of Paying Living Expenses: Not on file  Food Insecurity:   . Worried About Charity fundraiser in the Last Year: Not on file  . Ran Out of Food in the Last Year: Not on file  Transportation Needs:   . Lack of Transportation (Medical): Not on file  . Lack of Transportation (Non-Medical): Not on file  Physical Activity:   . Days of Exercise per Week: Not on file   . Minutes of Exercise per Session: Not on file  Stress:   . Feeling of Stress : Not on file  Social Connections:   . Frequency of Communication with Friends and Family: Not on file  . Frequency of Social Gatherings with Friends and Family: Not on file  . Attends Religious Services: Not on file  . Active Member of Clubs or Organizations: Not on file  . Attends Archivist Meetings: Not on file  . Marital Status: Not on file  Intimate Partner Violence:   . Fear of Current or Ex-Partner: Not on file  . Emotionally Abused: Not on file  . Physically Abused: Not on file  . Sexually Abused: Not on file      Review of Systems  Constitutional: Negative for chills, fatigue and unexpected weight change.  HENT: Negative for congestion, rhinorrhea, sneezing and sore throat.   Eyes: Negative for photophobia, pain and redness.  Respiratory: Negative for cough, chest tightness and shortness of breath.   Cardiovascular: Negative for chest pain and palpitations.  Gastrointestinal: Negative for abdominal pain, constipation, diarrhea, nausea and vomiting.  Endocrine: Negative.   Genitourinary: Negative for dysuria and frequency.  Musculoskeletal: Negative for arthralgias, back pain, joint swelling and neck pain.  Skin: Negative for rash.  Allergic/Immunologic: Negative.   Neurological: Negative for tremors and numbness.  Hematological: Negative for adenopathy. Does not bruise/bleed easily.  Psychiatric/Behavioral: Negative for behavioral problems and sleep disturbance. The patient is not nervous/anxious.     Vital Signs: BP 120/90   Pulse 100   Temp 97.6 F (36.4 C)   Resp 16   Ht 6\' 1"  (1.854 m)   Wt 158 lb 12.8 oz (72 kg)   LMP  (LMP Unknown)   SpO2 99%   BMI 20.95 kg/m    Physical Exam Vitals and nursing note reviewed.  Constitutional:      General: She is not in acute distress.    Appearance: She is well-developed. She is not diaphoretic.  HENT:     Head:  Normocephalic and atraumatic.     Mouth/Throat:     Pharynx: No oropharyngeal exudate.  Eyes:     Pupils: Pupils are equal, round, and reactive to light.  Neck:     Thyroid: No thyromegaly.     Vascular: No JVD.     Trachea: No tracheal deviation.  Cardiovascular:     Rate and Rhythm: Normal rate and regular rhythm.     Heart sounds: Normal heart sounds. No murmur. No friction rub. No gallop.   Pulmonary:     Effort: Pulmonary effort is normal. No respiratory distress.     Breath sounds: Normal breath sounds. No wheezing or rales.  Chest:     Chest wall: No tenderness.  Abdominal:     Palpations: Abdomen is soft.     Tenderness: There is no abdominal tenderness. There is no guarding.  Musculoskeletal:        General: Normal range of motion.     Cervical back: Normal range of motion and neck supple.  Lymphadenopathy:     Cervical: No cervical adenopathy.  Skin:    General: Skin is warm and dry.  Neurological:     Mental Status: She is alert and oriented to person, place, and time.     Cranial Nerves: No cranial nerve deficit.  Psychiatric:        Behavior: Behavior normal.        Thought Content: Thought content normal.        Judgment: Judgment normal.    Assessment/Plan: 1. Chronic abdominal pain Reviewed risks and possible side effects associated with taking opiates, benzodiazepines and other CNS depressants. Combination of these could cause dizziness and drowsiness. Advised patient not to drive or operate machinery when taking these medications, as patient's and other's life can be at risk and will have consequences. Patient verbalized understanding in this matter. Dependence and abuse for these drugs will be monitored closely. A Controlled substance policy and procedure is on file  which allows [facility] to order a urine drug screen test at any visit. Patient understands and agrees with the plan - fentaNYL (DURAGESIC) 25 MCG/HR; Place 1 patch onto the skin  every 3 (three) days.  Dispense: 10 patch; Refill: 0 - pregabalin (LYRICA) 100 MG capsule; Take 1 capsule (100 mg total) by mouth 3 (three) times daily.  Dispense: 90 capsule; Refill: 1  2. Acquired hypothyroidism Continue synthroid as prescribed - levothyroxine (SYNTHROID) 25 MCG tablet; Take 1 tablet (25 mcg total) by mouth daily before breakfast.  Dispense: 90 tablet; Refill: 1  3. GAD (generalized anxiety disorder) Controlled, continue present management.  4. Other migraine without status migrainosus, not intractable Doing well, continue symptom management.  5. Nausea and vomiting, intractability of vomiting not specified, unspecified vomiting type Continues to have symptoms intermittently.  Continue to see Gi and use TPN as ordered.  6. Mild intermittent asthma without complication Weill get nebulizer and send meds to pharmacy.  - For home use only DME Nebulizer machine - albuterol (PROVENTIL) (2.5 MG/3ML) 0.083% nebulizer solution; Take 3 mLs (2.5 mg total) by nebulization every 6 (six) hours as needed for wheezing or shortness of breath.  Dispense: 150 mL; Refill: 1  General Counseling: Exa verbalizes understanding of the findings of todays visit and agrees with plan of treatment. I have discussed any further diagnostic evaluation that may be needed or ordered today. We also reviewed her medications today. she has been encouraged to call the office with any questions or concerns that should arise related to todays visit.    No orders of the defined types were placed in this encounter.   No orders of the defined types were placed in this encounter.   Time spent: 25 Minutes   This patient was seen by Orson Gear AGNP-C in Collaboration with Dr Lavera Guise as a part of collaborative care agreement     Kendell Bane AGNP-C Internal medicine

## 2019-08-19 DIAGNOSIS — K861 Other chronic pancreatitis: Secondary | ICD-10-CM | POA: Diagnosis not present

## 2019-08-19 DIAGNOSIS — Z452 Encounter for adjustment and management of vascular access device: Secondary | ICD-10-CM | POA: Diagnosis not present

## 2019-08-19 DIAGNOSIS — E46 Unspecified protein-calorie malnutrition: Secondary | ICD-10-CM | POA: Diagnosis not present

## 2019-08-19 DIAGNOSIS — G8929 Other chronic pain: Secondary | ICD-10-CM | POA: Diagnosis not present

## 2019-08-19 DIAGNOSIS — K289 Gastrojejunal ulcer, unspecified as acute or chronic, without hemorrhage or perforation: Secondary | ICD-10-CM | POA: Diagnosis not present

## 2019-08-19 DIAGNOSIS — I10 Essential (primary) hypertension: Secondary | ICD-10-CM | POA: Diagnosis not present

## 2019-08-19 DIAGNOSIS — Z760 Encounter for issue of repeat prescription: Secondary | ICD-10-CM | POA: Diagnosis not present

## 2019-08-22 DIAGNOSIS — Z9884 Bariatric surgery status: Secondary | ICD-10-CM | POA: Diagnosis not present

## 2019-08-22 DIAGNOSIS — R9431 Abnormal electrocardiogram [ECG] [EKG]: Secondary | ICD-10-CM | POA: Diagnosis not present

## 2019-08-22 DIAGNOSIS — I82B12 Acute embolism and thrombosis of left subclavian vein: Secondary | ICD-10-CM | POA: Diagnosis not present

## 2019-08-22 DIAGNOSIS — Z9049 Acquired absence of other specified parts of digestive tract: Secondary | ICD-10-CM | POA: Diagnosis not present

## 2019-08-22 DIAGNOSIS — Z79899 Other long term (current) drug therapy: Secondary | ICD-10-CM | POA: Diagnosis not present

## 2019-08-22 DIAGNOSIS — M7989 Other specified soft tissue disorders: Secondary | ICD-10-CM | POA: Diagnosis not present

## 2019-08-22 DIAGNOSIS — I82402 Acute embolism and thrombosis of unspecified deep veins of left lower extremity: Secondary | ICD-10-CM | POA: Diagnosis not present

## 2019-08-22 DIAGNOSIS — F17290 Nicotine dependence, other tobacco product, uncomplicated: Secondary | ICD-10-CM | POA: Diagnosis not present

## 2019-08-22 DIAGNOSIS — I82A12 Acute embolism and thrombosis of left axillary vein: Secondary | ICD-10-CM | POA: Diagnosis not present

## 2019-08-25 DIAGNOSIS — I82A12 Acute embolism and thrombosis of left axillary vein: Secondary | ICD-10-CM | POA: Diagnosis not present

## 2019-08-26 ENCOUNTER — Ambulatory Visit: Payer: Medicare Other | Admitting: Nurse Practitioner

## 2019-09-01 DIAGNOSIS — Z452 Encounter for adjustment and management of vascular access device: Secondary | ICD-10-CM | POA: Diagnosis not present

## 2019-09-01 DIAGNOSIS — I82A12 Acute embolism and thrombosis of left axillary vein: Secondary | ICD-10-CM | POA: Diagnosis not present

## 2019-09-01 DIAGNOSIS — K861 Other chronic pancreatitis: Secondary | ICD-10-CM | POA: Diagnosis not present

## 2019-09-01 DIAGNOSIS — Z86718 Personal history of other venous thrombosis and embolism: Secondary | ICD-10-CM | POA: Diagnosis not present

## 2019-09-01 DIAGNOSIS — K289 Gastrojejunal ulcer, unspecified as acute or chronic, without hemorrhage or perforation: Secondary | ICD-10-CM | POA: Diagnosis not present

## 2019-09-01 DIAGNOSIS — Z720 Tobacco use: Secondary | ICD-10-CM | POA: Diagnosis not present

## 2019-09-02 DIAGNOSIS — Z86718 Personal history of other venous thrombosis and embolism: Secondary | ICD-10-CM | POA: Insufficient documentation

## 2019-09-09 ENCOUNTER — Telehealth: Payer: Self-pay

## 2019-09-09 NOTE — Telephone Encounter (Signed)
Confirmed appointment with patient and screened for covid. klh 

## 2019-09-13 ENCOUNTER — Encounter: Payer: Self-pay | Admitting: Adult Health

## 2019-09-13 ENCOUNTER — Other Ambulatory Visit: Payer: Self-pay

## 2019-09-13 ENCOUNTER — Ambulatory Visit (INDEPENDENT_AMBULATORY_CARE_PROVIDER_SITE_OTHER): Payer: Medicare Other | Admitting: Adult Health

## 2019-09-13 VITALS — BP 155/92 | HR 116 | Temp 97.4°F | Resp 16 | Ht 72.0 in | Wt 160.0 lb

## 2019-09-13 DIAGNOSIS — R300 Dysuria: Secondary | ICD-10-CM

## 2019-09-13 DIAGNOSIS — R5382 Chronic fatigue, unspecified: Secondary | ICD-10-CM | POA: Diagnosis not present

## 2019-09-13 DIAGNOSIS — K911 Postgastric surgery syndromes: Secondary | ICD-10-CM | POA: Diagnosis not present

## 2019-09-13 DIAGNOSIS — K909 Intestinal malabsorption, unspecified: Secondary | ICD-10-CM | POA: Diagnosis not present

## 2019-09-13 DIAGNOSIS — Z79899 Other long term (current) drug therapy: Secondary | ICD-10-CM

## 2019-09-13 DIAGNOSIS — Z0001 Encounter for general adult medical examination with abnormal findings: Secondary | ICD-10-CM | POA: Diagnosis not present

## 2019-09-13 DIAGNOSIS — F411 Generalized anxiety disorder: Secondary | ICD-10-CM | POA: Diagnosis not present

## 2019-09-13 DIAGNOSIS — I10 Essential (primary) hypertension: Secondary | ICD-10-CM | POA: Diagnosis not present

## 2019-09-13 LAB — POCT URINE DRUG SCREEN
POC Amphetamine UR: NOT DETECTED
POC BENZODIAZEPINES UR: NOT DETECTED
POC Barbiturate UR: NOT DETECTED
POC Cocaine UR: NOT DETECTED
POC Ecstasy UR: NOT DETECTED
POC Marijuana UR: POSITIVE — AB
POC Methadone UR: NOT DETECTED
POC Opiate Ur: NOT DETECTED
POC Oxycodone UR: NOT DETECTED
POC PHENCYCLIDINE UR: NOT DETECTED
POC TRICYCLICS UR: NOT DETECTED

## 2019-09-13 NOTE — Progress Notes (Signed)
[facility] [place], [place] [address]  Internal MEDICINE  Office Visit Note  Patient Name: Stacy Pineda  [id number]  ZN:[id number]  Date of Service: 09/13/2019  Chief Complaint  Patient presents with  . Annual Exam  . Depression  . Anxiety     HPI Pt is here for routine health maintenance examination. She has a complicated medical history that started after gastric bypass surgery in 2012. Her most recent complication is that she has developed a DVT is her let upper arm from having a PICC line in place in order to receive TPN. PICC line has since been removed, she was seen by vascular surgery that started her on Eliquis and prescribed liquid oxycodone for pain management. Today, her pain from DVT has improved, no issues with taking Eliquis. Her biggest concern today is managing her chronic pain. She was previously managing her chronic pain with Fentanyl patches and Lyrica. Recently the Fentanyl patches have been causing her lots of itching that in the past was controlled with Benadryl but has stopped helping. She has not been using a Fentanyl patch for the last 2-3 weeks. There was also a mix up with the pharmacy regarding her Lyrica and was without it for 2-3 weeks. She has been using the oxycodone for pain control at this time. Discussed with her to only take medication as prescribed. We also discussed that at this point there are no other options we can provide for her if she no longer wants to use the Fentanyl patches, we would have to refer her to pain management for further evaluation and management.  No longer on TPN, PEG tube has been removed. Attempting to keep up her nutrition through PO feedings. Continues to struggle daily with nausea and vomiting. Weight has been stable over the last month.  Blood pressure slightly elevated today. She states she feels as though it is from her pain today.  No other acute issues at this time. Denies chest pain, palpitations  or shortness of breath.  Current Medication: Outpatient Encounter Medications as of 09/13/2019  Medication Sig Note  . albuterol (PROVENTIL) (2.5 MG/3ML) 0.083% nebulizer solution Take 3 mLs (2.5 mg total) by nebulization every 6 (six) hours as needed for wheezing or shortness of breath.   Marland Kitchen albuterol (VENTOLIN HFA) 108 (90 Base) MCG/ACT inhaler Inhale 2 puffs into the lungs every 6 (six) hours as needed for wheezing or shortness of breath.   Darlin Priestly HFA 100 MCG/ACT AERO Inhale 2 puffs into the lungs daily.  06/28/2015: Received from: External Pharmacy  . clonazePAM (KLONOPIN) 1 MG tablet Take 1 mg by mouth 2 (two) times daily.   . fentaNYL (DURAGESIC) 25 MCG/HR Place 1 patch onto the skin every 3 (three) days.   Marland Kitchen levothyroxine (SYNTHROID) 25 MCG tablet Take 1 tablet (25 mcg total) by mouth daily before breakfast.   . medroxyPROGESTERone (DEPO-PROVERA) 150 MG/ML injection Inject 1 mL (150 mg total) into the muscle every 3 (three) months.   . Multiple Vitamins-Minerals (MULTIVITAMIN ADULT PO) Take 1 tablet by mouth.   . ondansetron (ZOFRAN-ODT) 8 MG disintegrating tablet Take 1 tablet (8 mg total) by mouth every 8 (eight) hours as needed for nausea or vomiting.   . potassium chloride (K-DUR) 10 MEQ tablet Take 1 tablet (10 mEq total) by mouth daily.   . prazosin (MINIPRESS) 5 MG capsule TAKE 1 CAPSULE BY MOUTH NIGHTLY   . pregabalin (LYRICA) 100 MG capsule Take 1 capsule (100 mg total) by  mouth 3 (three) times daily.   . promethazine (PHENERGAN) 25 MG tablet Take 1 tablet (25 mg total) by mouth every 8 (eight) hours as needed for nausea or vomiting.   . promethazine (PHENERGAN) 6.25 MG/5ML syrup TAKE  10 ML BY MOUTH EVERY 8 HOURS AS NEEDED FOR NAUSEA AND VOMITING OR  REFRACTORY  NAUSEA/VOMITING   . venlafaxine XR (EFFEXOR-XR) 150 MG 24 hr capsule TAKE 2 CAPSULES BY MOUTH IN THE MORNING   . mirtazapine (REMERON) 45 MG tablet Take 1 tablet (45 mg total) by mouth at bedtime.   Marland Kitchen Ubrogepant  (UBRELVY) 100 MG TABS Take 100 mg by mouth as needed.    No facility-administered encounter medications on file as of 09/13/2019.    Surgical History: Past Surgical History:  Procedure Laterality Date  . ABDOMINAL SURGERY    . CHOLECYSTECTOMY    . COLONOSCOPY WITH PROPOFOL N/A 09/08/2018   Procedure: COLONOSCOPY WITH PROPOFOL;  Surgeon: Virgel Manifold, MD;  Location: [facility] ENDOSCOPY;  Service: Endoscopy;  Laterality: N/A;  . DECUBITUS ULCER EXCISION    . DILATION AND CURETTAGE OF UTERUS    . ESOPHAGOGASTRODUODENOSCOPY (EGD) WITH PROPOFOL N/A 09/08/2018   Procedure: ESOPHAGOGASTRODUODENOSCOPY (EGD) WITH PROPOFOL;  Surgeon: Virgel Manifold, MD;  Location: [facility] ENDOSCOPY;  Service: Endoscopy;  Laterality: N/A;  . GASTRIC BYPASS    . GASTRIC BYPASS    . REVISION GASTRIC RESTRICTIVE PROCEDURE FOR MORBID OBESITY      Medical History: Past Medical History:  Diagnosis Date  . Anxiety   . Asthma   . Bilateral ovarian cysts   . Chronic headaches   . Depression   . Hypothyroidism   . Pancreatitis   . Pancreatitis   . PTSD (post-traumatic stress disorder)     Family History: Family History  Problem Relation Age of Onset  . Graves' disease Mother   . Hypertension Mother   . Hyperlipidemia Mother   . Anxiety disorder Father   . Prostate cancer Father   . Depression Brother   . Anxiety disorder Brother   . Anxiety disorder Maternal Aunt   . Depression Maternal Aunt   . Anxiety disorder Paternal Aunt   . Depression Paternal Aunt   . Anxiety disorder Maternal Uncle   . Depression Maternal Uncle   . Anxiety disorder Paternal Uncle   . Depression Paternal Uncle   . Anxiety disorder Maternal Grandfather   . Depression Maternal Grandfather   . Diabetes Maternal Grandfather   . Anxiety disorder Maternal Grandmother   . Depression Maternal Grandmother   . Breast cancer Maternal Grandmother   . Ovarian cancer Maternal Grandmother   . Diabetes Maternal Grandmother   . Colon  cancer Neg Hx       Review of Systems  Constitutional: Positive for fatigue. Negative for chills and unexpected weight change.  HENT: Negative for congestion, rhinorrhea, sneezing and sore throat.   Eyes: Negative for photophobia, pain and redness.  Respiratory: Negative for cough, chest tightness and shortness of breath.   Cardiovascular: Negative for chest pain and palpitations.  Gastrointestinal: Positive for nausea and vomiting. Negative for abdominal pain, constipation and diarrhea.  Endocrine: Negative.   Genitourinary: Negative for dysuria and frequency.  Musculoskeletal: Negative for arthralgias, back pain, joint swelling and neck pain.  Skin: Negative for rash.  Allergic/Immunologic: Negative.   Neurological: Negative for tremors and numbness.  Hematological: Negative for adenopathy. Does not bruise/bleed easily.  Psychiatric/Behavioral: Negative for behavioral problems and sleep disturbance. The patient is not nervous/anxious.  Vital Signs: BP (!) 155/92   Pulse (!) 116   Temp (!) 97.4 F (36.3 C)   Resp 16   Ht 6' (1.829 m)   Wt 160 lb (72.6 kg)   SpO2 97%   BMI 21.70 kg/m    Physical Exam Vitals and nursing note reviewed.  Constitutional:      General: She is not in acute distress.    Appearance: She is well-developed. She is not diaphoretic.  HENT:     Head: Normocephalic and atraumatic.     Mouth/Throat:     Pharynx: No oropharyngeal exudate.  Eyes:     Pupils: Pupils are equal, round, and reactive to light.  Neck:     Thyroid: No thyromegaly.     Vascular: No JVD.     Trachea: No tracheal deviation.  Cardiovascular:     Rate and Rhythm: Normal rate and regular rhythm.     Heart sounds: Normal heart sounds. No murmur. No friction rub. No gallop.   Pulmonary:     Effort: Pulmonary effort is normal. No respiratory distress.     Breath sounds: Normal breath sounds. No wheezing or rales.  Chest:     Chest wall: No tenderness.  Abdominal:      Palpations: Abdomen is soft.     Tenderness: There is no abdominal tenderness. There is no guarding.  Musculoskeletal:        General: Normal range of motion.     Cervical back: Normal range of motion and neck supple.  Lymphadenopathy:     Cervical: No cervical adenopathy.  Skin:    General: Skin is warm and dry.  Neurological:     Mental Status: She is alert and oriented to person, place, and time.     Cranial Nerves: No cranial nerve deficit.  Psychiatric:        Behavior: Behavior normal.        Thought Content: Thought content normal.        Judgment: Judgment normal.      LABS: Recent Results (from the past 2160 hour(s))  POCT Urine Drug Screen     Status: Abnormal   Collection Time: 09/13/19 10:50 AM  Result Value Ref Range   POC METHAMPHETAMINE UR     POC Opiate Ur None Detected None Detected   POC Barbiturate UR None Detected None Detected   POC Amphetamine UR None Detected None Detected   POC Oxycodone UR None Detected None Detected   POC Cocaine UR None Detected None Detected   POC Ecstasy UR None Detected None Detected   POC TRICYCLICS UR None Detected None Detected   POC PHENCYCLIDINE UR None Detected None Detected   POC MARIJUANA UR Positive (A) None Detected   POC METHADONE UR None Detected None Detected   POC BENZODIAZEPINES UR None Detected None Detected   URINE TEMPERATURE     POC DRUG SCREEN OXIDANTS URINE     POC SPECIFIC GRAVITY URINE     POC PH URINE     Methylenedioxyamphetamine       Assessment/Plan: 1. Encounter for general adult medical examination with abnormal findings Up to date on PHM. - Lipid Panel With LDL/HDL Ratio - TSH - T4, free - Comprehensive metabolic panel - [id number] and Folate Panel  2. Essential hypertension Slightly elevated BP today, associated with chronic pain that is not well managed today.  3. GAD (generalized anxiety disorder) Stable on current therapy, continue to monitor.  4. Chronic fatigue Will check labs and  review with patient at next visit. - CBC with Differential/Platelet - Fe+TIBC+Fer - Vitamin D (25 hydroxy)  5. Intestinal malabsorption, unspecified  Rule out vitamin deficiencies with her history of PEG/TPN feedings. Currently attempting to manage her herself with PO feedings only. - B12 and Folate Panel  6. Postgastric surgery syndromes  Complicated history following gastric bypass surgery in 2012. Follow up and review labs at next visit. - Vitamin D (25 hydroxy)  7. Dysuria - UA/M w/rflx Culture, Routine  8. Need for prophylactic chemotherapy - POCT Urine Drug Screen  General Counseling: Amarria verbalizes understanding of the findings of todays visit and agrees with plan of treatment. I have discussed any further diagnostic evaluation that may be needed or ordered today. We also reviewed her medications today. she has been encouraged to call the office with any questions or concerns that should arise related to todays visit.   Orders Placed This Encounter  Procedures  . UA/M w/rflx Culture, Routine  . POCT Urine Drug Screen    No orders of the defined types were placed in this encounter.   Time spent: 30 Minutes   This patient was seen by Orson Gear AGNP-C in Collaboration with Dr Lavera Guise as a part of collaborative care agreement    Kendell Bane AGNP-C Internal Medicine

## 2019-09-15 LAB — MICROSCOPIC EXAMINATION
Casts: NONE SEEN /lpf
Epithelial Cells (non renal): >10 /hpf — AB (ref 0–10)

## 2019-09-15 LAB — UA/M W/RFLX CULTURE, ROUTINE
Bilirubin, UA: NEGATIVE
Glucose, UA: NEGATIVE
Nitrite, UA: NEGATIVE
Protein,UA: NEGATIVE
RBC, UA: NEGATIVE
Specific Gravity, UA: >=1.03 — AB (ref 1.005–1.030)
Urobilinogen, Ur: 0.2 mg/dL (ref 0.2–1.0)
pH, UA: 5.5 (ref 5.0–7.5)

## 2019-09-15 LAB — URINE CULTURE, REFLEX

## 2019-09-28 DIAGNOSIS — E756 Lipid storage disorder, unspecified: Secondary | ICD-10-CM | POA: Diagnosis not present

## 2019-09-28 DIAGNOSIS — K909 Intestinal malabsorption, unspecified: Secondary | ICD-10-CM | POA: Diagnosis not present

## 2019-09-28 DIAGNOSIS — R5382 Chronic fatigue, unspecified: Secondary | ICD-10-CM | POA: Diagnosis not present

## 2019-09-28 DIAGNOSIS — K911 Postgastric surgery syndromes: Secondary | ICD-10-CM | POA: Diagnosis not present

## 2019-09-28 DIAGNOSIS — Z0001 Encounter for general adult medical examination with abnormal findings: Secondary | ICD-10-CM | POA: Diagnosis not present

## 2019-09-30 ENCOUNTER — Telehealth: Payer: Self-pay

## 2019-09-30 LAB — COMPREHENSIVE METABOLIC PANEL
ALT: 30 IU/L (ref 0–32)
AST: 32 IU/L (ref 0–40)
Albumin/Globulin Ratio: 1.4 (ref 1.2–2.2)
Albumin: 4.4 g/dL (ref 3.8–4.8)
Alkaline Phosphatase: 130 IU/L — ABNORMAL HIGH (ref 39–117)
BUN/Creatinine Ratio: 17 (ref 9–23)
BUN: 10 mg/dL (ref 6–24)
Bilirubin Total: 0.6 mg/dL (ref 0.0–1.2)
CO2: 16 mmol/L — ABNORMAL LOW (ref 20–29)
Calcium: 9.5 mg/dL (ref 8.7–10.2)
Chloride: 112 mmol/L — ABNORMAL HIGH (ref 96–106)
Creatinine, Ser: 0.58 mg/dL (ref 0.57–1.00)
GFR calc Af Amer: 132 mL/min/{1.73_m2} (ref >59)
GFR calc non Af Amer: 115 mL/min/{1.73_m2} (ref >59)
Globulin, Total: 3.2 g/dL (ref 1.5–4.5)
Glucose: 89 mg/dL (ref 65–99)
Potassium: 3.8 mmol/L (ref 3.5–5.2)
Sodium: 146 mmol/L — ABNORMAL HIGH (ref 134–144)
Total Protein: 7.6 g/dL (ref 6.0–8.5)

## 2019-09-30 LAB — CBC WITH DIFFERENTIAL/PLATELET
Basophils Absolute: 0.1 10*3/uL (ref 0.0–0.2)
Basos: 1 %
EOS (ABSOLUTE): 0.1 10*3/uL (ref 0.0–0.4)
Eos: 1 %
Hematocrit: 38.1 % (ref 34.0–46.6)
Hemoglobin: 12 g/dL (ref 11.1–15.9)
Immature Grans (Abs): 0 10*3/uL (ref 0.0–0.1)
Immature Granulocytes: 0 %
Lymphocytes Absolute: 4.3 10*3/uL — ABNORMAL HIGH (ref 0.7–3.1)
Lymphs: 45 %
MCH: 23.6 pg — ABNORMAL LOW (ref 26.6–33.0)
MCHC: 31.5 g/dL (ref 31.5–35.7)
MCV: 75 fL — ABNORMAL LOW (ref 79–97)
Monocytes Absolute: 0.4 10*3/uL (ref 0.1–0.9)
Monocytes: 4 %
Neutrophils Absolute: 4.6 10*3/uL (ref 1.4–7.0)
Neutrophils: 49 %
Platelets: 463 10*3/uL — ABNORMAL HIGH (ref 150–450)
RBC: 5.09 x10E6/uL (ref 3.77–5.28)
RDW: 19.6 % — ABNORMAL HIGH (ref 11.7–15.4)
WBC: 9.5 10*3/uL (ref 3.4–10.8)

## 2019-09-30 LAB — VITAMIN D 25 HYDROXY (VIT D DEFICIENCY, FRACTURES): Vit D, 25-Hydroxy: <4 ng/mL — ABNORMAL LOW (ref 30.0–100.0)

## 2019-09-30 LAB — LIPID PANEL WITH LDL/HDL RATIO
Cholesterol, Total: 163 mg/dL (ref 100–199)
HDL: 77 mg/dL (ref >39)
LDL Chol Calc (NIH): 69 mg/dL (ref 0–99)
LDL/HDL Ratio: 0.9 ratio (ref 0.0–3.2)
Triglycerides: 96 mg/dL (ref 0–149)
VLDL Cholesterol Cal: 17 mg/dL (ref 5–40)

## 2019-09-30 LAB — IRON,TIBC AND FERRITIN PANEL
Ferritin: 14 ng/mL — ABNORMAL LOW (ref 15–150)
Iron Saturation: 10 % — ABNORMAL LOW (ref 15–55)
Iron: 42 ug/dL (ref 27–159)
Total Iron Binding Capacity: 422 ug/dL (ref 250–450)
UIBC: 380 ug/dL (ref 131–425)

## 2019-09-30 LAB — B12 AND FOLATE PANEL
Folate: 3.8 ng/mL (ref >3.0)
Vitamin B-12: 138 pg/mL — ABNORMAL LOW (ref 232–1245)

## 2019-09-30 LAB — TSH: TSH: 0.519 u[IU]/mL (ref 0.450–4.500)

## 2019-09-30 LAB — T4, FREE: Free T4: 0.92 ng/dL (ref 0.82–1.77)

## 2019-09-30 NOTE — Telephone Encounter (Signed)
Called lmom informing patient of appointment on 10/04/2019. klh 

## 2019-10-04 ENCOUNTER — Encounter: Payer: Self-pay | Admitting: Adult Health

## 2019-10-04 ENCOUNTER — Ambulatory Visit: Payer: Medicare Other | Admitting: Adult Health

## 2019-10-04 ENCOUNTER — Other Ambulatory Visit: Payer: Self-pay

## 2019-10-04 VITALS — BP 133/97 | HR 74 | Temp 97.4°F | Resp 16 | Ht 72.0 in | Wt 158.0 lb

## 2019-10-04 DIAGNOSIS — R5382 Chronic fatigue, unspecified: Secondary | ICD-10-CM

## 2019-10-04 DIAGNOSIS — F17210 Nicotine dependence, cigarettes, uncomplicated: Secondary | ICD-10-CM

## 2019-10-04 DIAGNOSIS — I82A22 Chronic embolism and thrombosis of left axillary vein: Secondary | ICD-10-CM | POA: Diagnosis not present

## 2019-10-04 DIAGNOSIS — G8929 Other chronic pain: Secondary | ICD-10-CM

## 2019-10-04 DIAGNOSIS — R109 Unspecified abdominal pain: Secondary | ICD-10-CM | POA: Diagnosis not present

## 2019-10-04 DIAGNOSIS — E538 Deficiency of other specified B group vitamins: Secondary | ICD-10-CM

## 2019-10-04 DIAGNOSIS — E559 Vitamin D deficiency, unspecified: Secondary | ICD-10-CM | POA: Diagnosis not present

## 2019-10-04 DIAGNOSIS — F411 Generalized anxiety disorder: Secondary | ICD-10-CM

## 2019-10-04 MED ORDER — PREGABALIN 50 MG PO CAPS: 50.0000 mg | ORAL_CAPSULE | Freq: Three times a day (TID) | ORAL | 0 refills | Status: DC

## 2019-10-04 MED ORDER — CYANOCOBALAMIN 1000 MCG/ML IJ SOLN
1000.0000 ug | Freq: Once | INTRAMUSCULAR | Status: AC
  Administered 2019-10-04: 1000 ug via INTRAMUSCULAR

## 2019-10-04 MED ORDER — VITAMIN D (ERGOCALCIFEROL) 1.25 MG (50000 UNIT) PO CAPS: 50000.0000 [IU] | ORAL_CAPSULE | ORAL | 0 refills | Status: DC

## 2019-10-04 MED ORDER — APIXABAN 5 MG PO TABS: 5.0000 mg | ORAL_TABLET | Freq: Two times a day (BID) | ORAL | 1 refills | Status: DC

## 2019-10-04 NOTE — Progress Notes (Signed)
[facility] [place], [place] [address]  Internal MEDICINE  Office Visit Note  Patient Name: Stacy Pineda  [id number]  DR:[id number]  Date of Service: 11/28/2019  Chief Complaint  Patient presents with  . Follow-up  . Depression    HPI  Pt is here for follow up. She has ongoing issues.  DVT, continue eliquis for 3 months total. Smoking cessation  Current Medication: Outpatient Encounter Medications as of 10/04/2019  Medication Sig Note  . fentaNYL (DURAGESIC) 25 MCG/HR Place 1 patch onto the skin every 3 (three) days. (Patient not taking: Reported on 11/24/2019)   . Multiple Vitamins-Minerals (MULTIVITAMIN ADULT PO) Take 1 tablet by mouth.   . ondansetron (ZOFRAN-ODT) 8 MG disintegrating tablet Take 1 tablet (8 mg total) by mouth every 8 (eight) hours as needed for nausea or vomiting. (Patient not taking: Reported on 11/24/2019)   . promethazine (PHENERGAN) 6.25 MG/5ML syrup TAKE  10 ML BY MOUTH EVERY 8 HOURS AS NEEDED FOR NAUSEA AND VOMITING OR  REFRACTORY  NAUSEA/VOMITING (Patient not taking: Reported on 11/24/2019)   . [DISCONTINUED] albuterol (PROVENTIL) (2.5 MG/3ML) 0.083% nebulizer solution Take 3 mLs (2.5 mg total) by nebulization every 6 (six) hours as needed for wheezing or shortness of breath.   . [DISCONTINUED] albuterol (VENTOLIN HFA) 108 (90 Base) MCG/ACT inhaler Inhale 2 puffs into the lungs every 6 (six) hours as needed for wheezing or shortness of breath.   . [DISCONTINUED] ASMANEX HFA 100 MCG/ACT AERO Inhale 2 puffs into the lungs daily.  06/28/2015: Received from: External Pharmacy  . [DISCONTINUED] clonazePAM (KLONOPIN) 1 MG tablet Take 1 mg by mouth 2 (two) times daily.   . [DISCONTINUED] levothyroxine (SYNTHROID) 25 MCG tablet Take 1 tablet (25 mcg total) by mouth daily before breakfast.   . [DISCONTINUED] medroxyPROGESTERone (DEPO-PROVERA) 150 MG/ML injection Inject 1 mL (150 mg total) into the muscle every 3 (three) months.   .  [DISCONTINUED] potassium chloride (K-DUR) 10 MEQ tablet Take 1 tablet (10 mEq total) by mouth daily.   . [DISCONTINUED] prazosin (MINIPRESS) 5 MG capsule TAKE 1 CAPSULE BY MOUTH NIGHTLY   . [DISCONTINUED] pregabalin (LYRICA) 100 MG capsule Take 1 capsule (100 mg total) by mouth 3 (three) times daily.   . [DISCONTINUED] promethazine (PHENERGAN) 25 MG tablet Take 1 tablet (25 mg total) by mouth every 8 (eight) hours as needed for nausea or vomiting.   . [DISCONTINUED] venlafaxine XR (EFFEXOR-XR) 150 MG 24 hr capsule TAKE 2 CAPSULES BY MOUTH IN THE MORNING   . apixaban (ELIQUIS) 5 MG TABS tablet Take 1 tablet (5 mg total) by mouth 2 (two) times daily. (Patient not taking: Reported on 11/24/2019)   . Vitamin D, Ergocalciferol, (DRISDOL) 1.25 MG (50000 UNIT) CAPS capsule Take 1 capsule (50,000 Units total) by mouth every 7 (seven) days. (Patient not taking: Reported on 11/24/2019)   . [DISCONTINUED] mirtazapine (REMERON) 45 MG tablet Take 1 tablet (45 mg total) by mouth at bedtime.   . [DISCONTINUED] pregabalin (LYRICA) 50 MG capsule Take 1 capsule (50 mg total) by mouth 3 (three) times daily.   . [DISCONTINUED] Ubrogepant (UBRELVY) 100 MG TABS Take 100 mg by mouth as needed.   . [EXPIRED] cyanocobalamin ((VITAMIN B-12)) injection 1,000 mcg     No facility-administered encounter medications on file as of 10/04/2019.    Surgical History: Past Surgical History:  Procedure Laterality Date  . ABDOMINAL SURGERY    . CHOLECYSTECTOMY    . COLONOSCOPY WITH PROPOFOL N/A 09/08/2018   Procedure:  COLONOSCOPY WITH PROPOFOL;  Surgeon: Virgel Manifold, MD;  Location: [facility] ENDOSCOPY;  Service: Endoscopy;  Laterality: N/A;  . DECUBITUS ULCER EXCISION    . DILATION AND CURETTAGE OF UTERUS    . ESOPHAGOGASTRODUODENOSCOPY (EGD) WITH PROPOFOL N/A 09/08/2018   Procedure: ESOPHAGOGASTRODUODENOSCOPY (EGD) WITH PROPOFOL;  Surgeon: Virgel Manifold, MD;  Location: [facility] ENDOSCOPY;  Service: Endoscopy;  Laterality: N/A;   . GASTRIC BYPASS    . GASTRIC BYPASS    . REVISION GASTRIC RESTRICTIVE PROCEDURE FOR MORBID OBESITY      Medical History: Past Medical History:  Diagnosis Date  . Anxiety   . Asthma   . Bilateral ovarian cysts   . Chronic headaches   . Depression   . Hypothyroidism   . Pancreatitis   . Pancreatitis   . PTSD (post-traumatic stress disorder)     Family History: Family History  Problem Relation Age of Onset  . Graves' disease Mother   . Hypertension Mother   . Hyperlipidemia Mother   . Anxiety disorder Father   . Prostate cancer Father   . Depression Brother   . Anxiety disorder Brother   . Anxiety disorder Maternal Aunt   . Depression Maternal Aunt   . Anxiety disorder Paternal Aunt   . Depression Paternal Aunt   . Anxiety disorder Maternal Uncle   . Depression Maternal Uncle   . Anxiety disorder Paternal Uncle   . Depression Paternal Uncle   . Anxiety disorder Maternal Grandfather   . Depression Maternal Grandfather   . Diabetes Maternal Grandfather   . Anxiety disorder Maternal Grandmother   . Depression Maternal Grandmother   . Breast cancer Maternal Grandmother   . Ovarian cancer Maternal Grandmother   . Diabetes Maternal Grandmother   . Colon cancer Neg Hx     Social History   Socioeconomic History  . Marital status: Single    Spouse name: Not on file  . Number of children: Not on file  . Years of education: Not on file  . Highest education level: Not on file  Occupational History  . Not on file  Tobacco Use  . Smoking status: Current Some Day Smoker    Packs/day: 0.25    Types: Cigars, E-cigarettes, Cigarettes    Start date: 03/26/2005  . Smokeless tobacco: Never Used  Substance and Sexual Activity  . Alcohol use: No    Alcohol/week: 0.0 standard drinks  . Drug use: Yes    Frequency: 3.0 times per week    Types: Marijuana    Comment: MJ use weekly  . Sexual activity: Not Currently    Birth control/protection: Injection  Other Topics  Concern  . Not on file  Social History Narrative  . Not on file   Social Determinants of Health   Financial Resource Strain:   . Difficulty of Paying Living Expenses:   Food Insecurity:   . Worried About Charity fundraiser in the Last Year:   . Arboriculturist in the Last Year:   Transportation Needs:   . Film/video editor (Medical):   Marland Kitchen Lack of Transportation (Non-Medical):   Physical Activity:   . Days of Exercise per Week:   . Minutes of Exercise per Session:   Stress:   . Feeling of Stress :   Social Connections:   . Frequency of Communication with Friends and Family:   . Frequency of Social Gatherings with Friends and Family:   . Attends Religious Services:   . Active Member of  Clubs or Organizations:   . Attends Archivist Meetings:   Marland Kitchen Marital Status:   Intimate Partner Violence:   . Fear of Current or Ex-Partner:   . Emotionally Abused:   Marland Kitchen Physically Abused:   . Sexually Abused:       Review of Systems  Vital Signs: BP (!) 133/97   Pulse 74   Temp (!) 97.4 F (36.3 C)   Resp 16   Ht 6' (1.829 m)   Wt 158 lb (71.7 kg)   SpO2 96%   BMI 21.43 kg/m    Physical Exam Vitals and nursing note reviewed.  Constitutional:      General: She is not in acute distress.    Appearance: She is well-developed. She is not diaphoretic.  HENT:     Head: Normocephalic and atraumatic.     Mouth/Throat:     Pharynx: No oropharyngeal exudate.  Eyes:     Pupils: Pupils are equal, round, and reactive to light.  Neck:     Thyroid: No thyromegaly.     Vascular: No JVD.     Trachea: No tracheal deviation.  Cardiovascular:     Rate and Rhythm: Normal rate and regular rhythm.     Heart sounds: Normal heart sounds. No murmur. No friction rub. No gallop.   Pulmonary:     Effort: Pulmonary effort is normal. No respiratory distress.     Breath sounds: Normal breath sounds. No wheezing or rales.  Chest:     Chest wall: No tenderness.  Abdominal:      Palpations: Abdomen is soft.     Tenderness: There is no abdominal tenderness. There is no guarding.  Musculoskeletal:        General: Normal range of motion.     Cervical back: Normal range of motion and neck supple.  Lymphadenopathy:     Cervical: No cervical adenopathy.  Skin:    General: Skin is warm and dry.  Neurological:     Mental Status: She is alert and oriented to person, place, and time.     Cranial Nerves: No cranial nerve deficit.  Psychiatric:        Behavior: Behavior normal.        Thought Content: Thought content normal.        Judgment: Judgment normal.    Assessment/Plan: 1. Vitamin D deficiency Take Vitamin D as prescribed.  Will follow up on level in 3 months.  - Vitamin D, Ergocalciferol, (DRISDOL) 1.25 MG (50000 UNIT) CAPS capsule; Take 1 capsule (50,000 Units total) by mouth every 7 (seven) days. (Patient not taking: Reported on 11/24/2019)  Dispense: 10 capsule; Refill: 0  2. B12 deficiency B12 injection today.  - cyanocobalamin ((VITAMIN B-12)) injection 1,000 mcg  3. Chronic deep vein thrombosis (DVT) of axillary vein of left upper extremity (HCC) Continue Eliquis as discussed for period of 3 months total. - apixaban (ELIQUIS) 5 MG TABS tablet; Take 1 tablet (5 mg total) by mouth 2 (two) times daily. (Patient not taking: Reported on 11/24/2019)  Dispense: 60 tablet; Refill: 1  4. Cigarette nicotine dependence without complication Smoking cessation counseling: 1. Pt acknowledges the risks of long term smoking, she will try to quite smoking. 2. Options for different medications including nicotine products, chewing gum, patch etc, Wellbutrin and Chantix is discussed 3. Goal and date of compete cessation is discussed 4. Total time spent in smoking cessation is 15 min. 5.  5. Chronic fatigue Stable, continue to monitor vitamin levels.  6.  Chronic abdominal pain Stable, continue to use lyrica as prescribed.   7. GAD (generalized anxiety  disorder) Overall stable, continue present management.  General Counseling: miyoko mohseni understanding of the findings of todays visit and agrees with plan of treatment. I have discussed any further diagnostic evaluation that may be needed or ordered today. We also reviewed her medications today. she has been encouraged to call the office with any questions or concerns that should arise related to todays visit.    No orders of the defined types were placed in this encounter.   Meds ordered this encounter  Medications  . DISCONTD: pregabalin (LYRICA) 50 MG capsule    Sig: Take 1 capsule (50 mg total) by mouth 3 (three) times daily.    Dispense:  90 capsule    Refill:  0  . apixaban (ELIQUIS) 5 MG TABS tablet    Sig: Take 1 tablet (5 mg total) by mouth 2 (two) times daily.    Dispense:  60 tablet    Refill:  1  . Vitamin D, Ergocalciferol, (DRISDOL) 1.25 MG (50000 UNIT) CAPS capsule    Sig: Take 1 capsule (50,000 Units total) by mouth every 7 (seven) days.    Dispense:  10 capsule    Refill:  0  . cyanocobalamin ((VITAMIN B-12)) injection 1,000 mcg    Time spent: 25 Minutes   This patient was seen by Orson Gear AGNP-C in Collaboration with Dr Lavera Guise as a part of collaborative care agreement     Kendell Bane AGNP-C Internal medicine

## 2019-10-07 ENCOUNTER — Ambulatory Visit: Payer: Medicare Other

## 2019-10-11 ENCOUNTER — Other Ambulatory Visit: Payer: Self-pay

## 2019-10-11 ENCOUNTER — Ambulatory Visit (INDEPENDENT_AMBULATORY_CARE_PROVIDER_SITE_OTHER): Payer: Medicare Other | Admitting: Certified Nurse Midwife

## 2019-10-11 ENCOUNTER — Ambulatory Visit (INDEPENDENT_AMBULATORY_CARE_PROVIDER_SITE_OTHER): Payer: Medicare Other

## 2019-10-11 VITALS — BP 112/82 | HR 97 | Ht 72.0 in | Wt 157.3 lb

## 2019-10-11 DIAGNOSIS — Z3042 Encounter for surveillance of injectable contraceptive: Secondary | ICD-10-CM | POA: Diagnosis not present

## 2019-10-11 DIAGNOSIS — E538 Deficiency of other specified B group vitamins: Secondary | ICD-10-CM | POA: Diagnosis not present

## 2019-10-11 MED ORDER — MEDROXYPROGESTERONE ACETATE 150 MG/ML IM SUSP
150.0000 mg | Freq: Once | INTRAMUSCULAR | Status: AC
  Administered 2019-10-11: 150 mg via INTRAMUSCULAR

## 2019-10-11 MED ORDER — CYANOCOBALAMIN 1000 MCG/ML IJ SOLN
1000.0000 ug | Freq: Once | INTRAMUSCULAR | Status: AC
  Administered 2019-10-11: 1000 ug via INTRAMUSCULAR

## 2019-10-11 NOTE — Progress Notes (Signed)
Last depo inj: 07/22/19 UPT: N/A Side effects: none Next Depo- Provera injection due: 12/27/19-01/10/20 Annual exam due: 11/2019

## 2019-10-13 ENCOUNTER — Telehealth: Payer: Self-pay

## 2019-10-13 NOTE — Progress Notes (Signed)
I have reviewed the record and concur with patient management and plan of care.    Diona Fanti, CNM [facility] 10/13/19 11:22 AM

## 2019-10-13 NOTE — Telephone Encounter (Signed)
PLAN OF CARE AND ORDERS SIGNED AND PLACED IN WELLCARE FOLDER. 

## 2019-10-17 ENCOUNTER — Telehealth: Payer: Self-pay

## 2019-10-17 NOTE — Telephone Encounter (Signed)
Confirmed appointment on 10/19/2019 and screened for covid. klh

## 2019-10-18 ENCOUNTER — Ambulatory Visit: Payer: Medicare Other

## 2019-10-18 ENCOUNTER — Other Ambulatory Visit: Payer: Self-pay

## 2019-10-18 ENCOUNTER — Ambulatory Visit (INDEPENDENT_AMBULATORY_CARE_PROVIDER_SITE_OTHER): Payer: Medicare Other | Admitting: Adult Health

## 2019-10-18 ENCOUNTER — Encounter: Payer: Self-pay | Admitting: Adult Health

## 2019-10-18 VITALS — BP 123/86 | HR 87 | Temp 97.5°F | Resp 16 | Ht 72.0 in | Wt 154.2 lb

## 2019-10-18 DIAGNOSIS — B373 Candidiasis of vulva and vagina: Secondary | ICD-10-CM

## 2019-10-18 DIAGNOSIS — B3731 Acute candidiasis of vulva and vagina: Secondary | ICD-10-CM

## 2019-10-18 DIAGNOSIS — Z6821 Body mass index (BMI) 21.0-21.9, adult: Secondary | ICD-10-CM

## 2019-10-18 DIAGNOSIS — E538 Deficiency of other specified B group vitamins: Secondary | ICD-10-CM | POA: Diagnosis not present

## 2019-10-18 DIAGNOSIS — J0110 Acute frontal sinusitis, unspecified: Secondary | ICD-10-CM | POA: Diagnosis not present

## 2019-10-18 MED ORDER — FLUCONAZOLE 150 MG PO TABS: 150.0000 mg | ORAL_TABLET | Freq: Once | ORAL | 0 refills | Status: AC

## 2019-10-18 MED ORDER — AMOXICILLIN-POT CLAVULANATE 875-125 MG PO TABS: 1.0000 | ORAL_TABLET | Freq: Two times a day (BID) | ORAL | 0 refills | Status: DC

## 2019-10-18 MED ORDER — CYANOCOBALAMIN 1000 MCG/ML IJ SOLN
1000.0000 ug | Freq: Once | INTRAMUSCULAR | Status: AC
  Administered 2019-10-18: 1000 ug via INTRAMUSCULAR

## 2019-10-18 MED ORDER — AMOXICILLIN-POT CLAVULANATE 400-57 MG/5ML PO SUSR: 800.0000 mg | Freq: Two times a day (BID) | ORAL | 0 refills | Status: DC

## 2019-10-18 NOTE — Progress Notes (Signed)
[facility] [place], [place] [address]  Internal MEDICINE  Office Visit Note  Patient Name: Stacy Pineda  [id number]  DR:[id number]  Date of Service: 10/18/2019  Chief Complaint  Patient presents with  . Headache    headache for a week   . Shoulder Pain    tightness in shoulder     HPI Pt is here for a sick visit. She reports one week ago she woke up with mild headache and tightness in her shoulders.  She tried multiple OTC medications with no relief. She reports the pain in in her temporal area initially, and then it will move to the very back of her head, or under her eyes. She describes it as pressure, and tightness mostly.       Current Medication:  Outpatient Encounter Medications as of 10/18/2019  Medication Sig Note  . albuterol (PROVENTIL) (2.5 MG/3ML) 0.083% nebulizer solution Take 3 mLs (2.5 mg total) by nebulization every 6 (six) hours as needed for wheezing or shortness of breath.   Marland Kitchen albuterol (VENTOLIN HFA) 108 (90 Base) MCG/ACT inhaler Inhale 2 puffs into the lungs every 6 (six) hours as needed for wheezing or shortness of breath.   Marland Kitchen apixaban (ELIQUIS) 5 MG TABS tablet Take 1 tablet (5 mg total) by mouth 2 (two) times daily.   Darlin Priestly HFA 100 MCG/ACT AERO Inhale 2 puffs into the lungs daily.  06/28/2015: Received from: External Pharmacy  . clonazePAM (KLONOPIN) 1 MG tablet Take 1 mg by mouth 2 (two) times daily.   . fentaNYL (DURAGESIC) 25 MCG/HR Place 1 patch onto the skin every 3 (three) days.   Marland Kitchen levothyroxine (SYNTHROID) 25 MCG tablet Take 1 tablet (25 mcg total) by mouth daily before breakfast.   . medroxyPROGESTERone (DEPO-PROVERA) 150 MG/ML injection Inject 1 mL (150 mg total) into the muscle every 3 (three) months.   . Multiple Vitamins-Minerals (MULTIVITAMIN ADULT PO) Take 1 tablet by mouth.   . ondansetron (ZOFRAN-ODT) 8 MG disintegrating tablet Take 1 tablet (8 mg total) by mouth every 8 (eight) hours as needed for nausea or  vomiting.   . potassium chloride (K-DUR) 10 MEQ tablet Take 1 tablet (10 mEq total) by mouth daily.   . prazosin (MINIPRESS) 5 MG capsule TAKE 1 CAPSULE BY MOUTH NIGHTLY   . pregabalin (LYRICA) 50 MG capsule Take 1 capsule (50 mg total) by mouth 3 (three) times daily.   . promethazine (PHENERGAN) 25 MG tablet Take 1 tablet (25 mg total) by mouth every 8 (eight) hours as needed for nausea or vomiting.   . promethazine (PHENERGAN) 6.25 MG/5ML syrup TAKE  10 ML BY MOUTH EVERY 8 HOURS AS NEEDED FOR NAUSEA AND VOMITING OR  REFRACTORY  NAUSEA/VOMITING   . venlafaxine XR (EFFEXOR-XR) 150 MG 24 hr capsule TAKE 2 CAPSULES BY MOUTH IN THE MORNING   . Vitamin D, Ergocalciferol, (DRISDOL) 1.25 MG (50000 UNIT) CAPS capsule Take 1 capsule (50,000 Units total) by mouth every 7 (seven) days.   . mirtazapine (REMERON) 45 MG tablet Take 1 tablet (45 mg total) by mouth at bedtime.   Marland Kitchen Ubrogepant (UBRELVY) 100 MG TABS Take 100 mg by mouth as needed.    Facility-Administered Encounter Medications as of 10/18/2019  Medication  . cyanocobalamin ((VITAMIN B-12)) injection 1,000 mcg      Medical History: Past Medical History:  Diagnosis Date  . Anxiety   . Asthma   . Bilateral ovarian cysts   . Chronic headaches   .  Depression   . Hypothyroidism   . Pancreatitis   . Pancreatitis   . PTSD (post-traumatic stress disorder)      Vital Signs: BP 123/86   Pulse 87   Temp (!) 97.5 F (36.4 C)   Resp 16   Ht 6' (1.829 m)   Wt 154 lb 3.2 oz (69.9 kg)   SpO2 100%   BMI 20.91 kg/m    Review of Systems  Constitutional: Negative for chills, fatigue and unexpected weight change.  HENT: Negative for congestion, rhinorrhea, sneezing and sore throat.   Eyes: Negative for photophobia, pain and redness.  Respiratory: Negative for cough, chest tightness and shortness of breath.   Cardiovascular: Negative for chest pain and palpitations.  Gastrointestinal: Negative for abdominal pain, constipation, diarrhea,  nausea and vomiting.  Endocrine: Negative.   Genitourinary: Negative for dysuria and frequency.  Musculoskeletal: Negative for arthralgias, back pain, joint swelling and neck pain.  Skin: Negative for rash.  Allergic/Immunologic: Negative.   Neurological: Negative for tremors and numbness.  Hematological: Negative for adenopathy. Does not bruise/bleed easily.  Psychiatric/Behavioral: Negative for behavioral problems and sleep disturbance. The patient is not nervous/anxious.     Physical Exam Vitals and nursing note reviewed.  Constitutional:      General: She is not in acute distress.    Appearance: She is well-developed. She is not diaphoretic.  HENT:     Head: Normocephalic and atraumatic.     Mouth/Throat:     Pharynx: No oropharyngeal exudate.  Eyes:     Pupils: Pupils are equal, round, and reactive to light.  Neck:     Thyroid: No thyromegaly.     Vascular: No JVD.     Trachea: No tracheal deviation.  Cardiovascular:     Rate and Rhythm: Normal rate and regular rhythm.     Heart sounds: Normal heart sounds. No murmur. No friction rub. No gallop.   Pulmonary:     Effort: Pulmonary effort is normal. No respiratory distress.     Breath sounds: Normal breath sounds. No wheezing or rales.  Chest:     Chest wall: No tenderness.  Abdominal:     Palpations: Abdomen is soft.     Tenderness: There is no abdominal tenderness. There is no guarding.  Musculoskeletal:        General: Normal range of motion.     Cervical back: Normal range of motion and neck supple.  Lymphadenopathy:     Cervical: No cervical adenopathy.  Skin:    General: Skin is warm and dry.  Neurological:     Mental Status: She is alert and oriented to person, place, and time.     Cranial Nerves: No cranial nerve deficit.  Psychiatric:        Behavior: Behavior normal.        Thought Content: Thought content normal.        Judgment: Judgment normal.    Assessment/Plan: 1. Subacute frontal  sinusitis Advised patient to take entire course of antibiotics as prescribed with food. Pt should return to clinic in 7-10 days if symptoms fail to improve or new symptoms develop.  - amoxicillin-clavulanate (AUGMENTIN) 400-57 MG/5ML suspension; Take 10 mLs (800 mg total) by mouth 2 (two) times daily.  Dispense: 200 mL; Refill: 0  2. B12 deficiency B12 injection given today.  - cyanocobalamin ((VITAMIN B-12)) injection 1,000 mcg  3. Vaginal candida Use Diflucan as prescribed.  - fluconazole (DIFLUCAN) 150 MG tablet; Take 1 tablet (150 mg total) by mouth  once for 1 dose.  Dispense: 1 tablet; Refill: 0    General Counseling: Audrie verbalizes understanding of the findings of todays visit and agrees with plan of treatment. I have discussed any further diagnostic evaluation that may be needed or ordered today. We also reviewed her medications today. she has been encouraged to call the office with any questions or concerns that should arise related to todays visit.   No orders of the defined types were placed in this encounter.   Meds ordered this encounter  Medications  . cyanocobalamin ((VITAMIN B-12)) injection 1,000 mcg    Time spent: 30 Minutes  This patient was seen by Orson Gear AGNP-C in Collaboration with Dr Lavera Guise as a part of collaborative care agreement.  Kendell Bane AGNP-C Internal Medicine

## 2019-10-28 ENCOUNTER — Telehealth: Payer: Self-pay

## 2019-10-28 NOTE — Telephone Encounter (Signed)
Called lmom informing patient of appointment on 11/01/2019. klh 

## 2019-11-01 ENCOUNTER — Ambulatory Visit: Payer: Medicare Other | Admitting: Adult Health

## 2019-11-01 ENCOUNTER — Other Ambulatory Visit: Payer: Self-pay

## 2019-11-01 ENCOUNTER — Encounter: Payer: Self-pay | Admitting: Adult Health

## 2019-11-01 VITALS — BP 131/88 | HR 80 | Temp 97.5°F | Resp 16 | Ht 72.0 in | Wt 165.0 lb

## 2019-11-01 DIAGNOSIS — E538 Deficiency of other specified B group vitamins: Secondary | ICD-10-CM | POA: Diagnosis not present

## 2019-11-01 DIAGNOSIS — M62838 Other muscle spasm: Secondary | ICD-10-CM | POA: Diagnosis not present

## 2019-11-01 DIAGNOSIS — R519 Headache, unspecified: Secondary | ICD-10-CM

## 2019-11-01 DIAGNOSIS — I10 Essential (primary) hypertension: Secondary | ICD-10-CM | POA: Diagnosis not present

## 2019-11-01 DIAGNOSIS — F411 Generalized anxiety disorder: Secondary | ICD-10-CM

## 2019-11-01 DIAGNOSIS — G8929 Other chronic pain: Secondary | ICD-10-CM | POA: Diagnosis not present

## 2019-11-01 DIAGNOSIS — I82A12 Acute embolism and thrombosis of left axillary vein: Secondary | ICD-10-CM

## 2019-11-01 MED ORDER — CYCLOBENZAPRINE HCL 5 MG PO TABS: 5.0000 mg | ORAL_TABLET | Freq: Three times a day (TID) | ORAL | 1 refills | Status: DC | PRN

## 2019-11-01 MED ORDER — CYANOCOBALAMIN 1000 MCG/ML IJ SOLN
1000.0000 ug | Freq: Once | INTRAMUSCULAR | Status: AC
  Administered 2019-11-01: 1000 ug via INTRAMUSCULAR

## 2019-11-01 MED ORDER — PREGABALIN 50 MG PO CAPS: 50.0000 mg | ORAL_CAPSULE | Freq: Three times a day (TID) | ORAL | 1 refills | Status: DC

## 2019-11-01 NOTE — Progress Notes (Signed)
[facility] [place], [place] [address]  Internal MEDICINE  Office Visit Note  Patient Name: Stacy Pineda  [id number]  DR:[id number]  Date of Service: 11/01/2019  Chief Complaint  Patient presents with  . Follow-up  . Depression    HPI  Pt is here for follow up on depression,HTN, headaches, b12 deficiency and chronic pain.  Her BP is controlled at 131/88, Denies Chest pain, Shortness of breath, palpitations, headache, or blurred vision. Her depression and headaches are at baseline. She will get B12 injection today, as her level was 138, and she will continue with Vit D supplementation as her level is less than 4.       Current Medication: Outpatient Encounter Medications as of 11/01/2019  Medication Sig Note  . albuterol (PROVENTIL) (2.5 MG/3ML) 0.083% nebulizer solution Take 3 mLs (2.5 mg total) by nebulization every 6 (six) hours as needed for wheezing or shortness of breath.   Marland Kitchen albuterol (VENTOLIN HFA) 108 (90 Base) MCG/ACT inhaler Inhale 2 puffs into the lungs every 6 (six) hours as needed for wheezing or shortness of breath.   Marland Kitchen amoxicillin-clavulanate (AUGMENTIN) 400-57 MG/5ML suspension Take 10 mLs (800 mg total) by mouth 2 (two) times daily.   Marland Kitchen apixaban (ELIQUIS) 5 MG TABS tablet Take 1 tablet (5 mg total) by mouth 2 (two) times daily.   Darlin Priestly HFA 100 MCG/ACT AERO Inhale 2 puffs into the lungs daily.  06/28/2015: Received from: External Pharmacy  . clonazePAM (KLONOPIN) 1 MG tablet Take 1 mg by mouth 2 (two) times daily.   . fentaNYL (DURAGESIC) 25 MCG/HR Place 1 patch onto the skin every 3 (three) days.   Marland Kitchen levothyroxine (SYNTHROID) 25 MCG tablet Take 1 tablet (25 mcg total) by mouth daily before breakfast.   . medroxyPROGESTERone (DEPO-PROVERA) 150 MG/ML injection Inject 1 mL (150 mg total) into the muscle every 3 (three) months.   . Multiple Vitamins-Minerals (MULTIVITAMIN ADULT PO) Take 1 tablet by mouth.   . ondansetron (ZOFRAN-ODT) 8 MG  disintegrating tablet Take 1 tablet (8 mg total) by mouth every 8 (eight) hours as needed for nausea or vomiting.   . potassium chloride (K-DUR) 10 MEQ tablet Take 1 tablet (10 mEq total) by mouth daily.   . prazosin (MINIPRESS) 5 MG capsule TAKE 1 CAPSULE BY MOUTH NIGHTLY   . pregabalin (LYRICA) 50 MG capsule Take 1 capsule (50 mg total) by mouth 3 (three) times daily.   . promethazine (PHENERGAN) 25 MG tablet Take 1 tablet (25 mg total) by mouth every 8 (eight) hours as needed for nausea or vomiting.   . promethazine (PHENERGAN) 6.25 MG/5ML syrup TAKE  10 ML BY MOUTH EVERY 8 HOURS AS NEEDED FOR NAUSEA AND VOMITING OR  REFRACTORY  NAUSEA/VOMITING   . Ubrogepant (UBRELVY) 100 MG TABS Take 100 mg by mouth as needed.   . venlafaxine XR (EFFEXOR-XR) 150 MG 24 hr capsule TAKE 2 CAPSULES BY MOUTH IN THE MORNING   . Vitamin D, Ergocalciferol, (DRISDOL) 1.25 MG (50000 UNIT) CAPS capsule Take 1 capsule (50,000 Units total) by mouth every 7 (seven) days.   . mirtazapine (REMERON) 45 MG tablet Take 1 tablet (45 mg total) by mouth at bedtime.    Facility-Administered Encounter Medications as of 11/01/2019  Medication  . cyanocobalamin ((VITAMIN B-12)) injection 1,000 mcg    Surgical History: Past Surgical History:  Procedure Laterality Date  . ABDOMINAL SURGERY    . CHOLECYSTECTOMY    . COLONOSCOPY WITH PROPOFOL N/A 09/08/2018  Procedure: COLONOSCOPY WITH PROPOFOL;  Surgeon: Virgel Manifold, MD;  Location: [facility] ENDOSCOPY;  Service: Endoscopy;  Laterality: N/A;  . DECUBITUS ULCER EXCISION    . DILATION AND CURETTAGE OF UTERUS    . ESOPHAGOGASTRODUODENOSCOPY (EGD) WITH PROPOFOL N/A 09/08/2018   Procedure: ESOPHAGOGASTRODUODENOSCOPY (EGD) WITH PROPOFOL;  Surgeon: Virgel Manifold, MD;  Location: [facility] ENDOSCOPY;  Service: Endoscopy;  Laterality: N/A;  . GASTRIC BYPASS    . GASTRIC BYPASS    . REVISION GASTRIC RESTRICTIVE PROCEDURE FOR MORBID OBESITY      Medical History: Past Medical  History:  Diagnosis Date  . Anxiety   . Asthma   . Bilateral ovarian cysts   . Chronic headaches   . Depression   . Hypothyroidism   . Pancreatitis   . Pancreatitis   . PTSD (post-traumatic stress disorder)     Family History: Family History  Problem Relation Age of Onset  . Graves' disease Mother   . Hypertension Mother   . Hyperlipidemia Mother   . Anxiety disorder Father   . Prostate cancer Father   . Depression Brother   . Anxiety disorder Brother   . Anxiety disorder Maternal Aunt   . Depression Maternal Aunt   . Anxiety disorder Paternal Aunt   . Depression Paternal Aunt   . Anxiety disorder Maternal Uncle   . Depression Maternal Uncle   . Anxiety disorder Paternal Uncle   . Depression Paternal Uncle   . Anxiety disorder Maternal Grandfather   . Depression Maternal Grandfather   . Diabetes Maternal Grandfather   . Anxiety disorder Maternal Grandmother   . Depression Maternal Grandmother   . Breast cancer Maternal Grandmother   . Ovarian cancer Maternal Grandmother   . Diabetes Maternal Grandmother   . Colon cancer Neg Hx     Social History   Socioeconomic History  . Marital status: Single    Spouse name: Not on file  . Number of children: Not on file  . Years of education: Not on file  . Highest education level: Not on file  Occupational History  . Not on file  Tobacco Use  . Smoking status: Current Some Day Smoker    Packs/day: 0.25    Types: Cigars, E-cigarettes, Cigarettes    Start date: 03/26/2005  . Smokeless tobacco: Never Used  Substance and Sexual Activity  . Alcohol use: No    Alcohol/week: 0.0 standard drinks  . Drug use: Yes    Frequency: 3.0 times per week    Types: Marijuana    Comment: MJ use weekly  . Sexual activity: Not Currently    Birth control/protection: Injection  Other Topics Concern  . Not on file  Social History Narrative  . Not on file   Social Determinants of Health   Financial Resource Strain:   . Difficulty  of Paying Living Expenses:   Food Insecurity:   . Worried About Charity fundraiser in the Last Year:   . Arboriculturist in the Last Year:   Transportation Needs:   . Film/video editor (Medical):   Marland Kitchen Lack of Transportation (Non-Medical):   Physical Activity:   . Days of Exercise per Week:   . Minutes of Exercise per Session:   Stress:   . Feeling of Stress :   Social Connections:   . Frequency of Communication with Friends and Family:   . Frequency of Social Gatherings with Friends and Family:   . Attends Religious Services:   . Active Member  of Clubs or Organizations:   . Attends Archivist Meetings:   Marland Kitchen Marital Status:   Intimate Partner Violence:   . Fear of Current or Ex-Partner:   . Emotionally Abused:   Marland Kitchen Physically Abused:   . Sexually Abused:       Review of Systems  Constitutional: Negative for chills, fatigue and unexpected weight change.  HENT: Negative for congestion, rhinorrhea, sneezing and sore throat.   Eyes: Negative for photophobia, pain and redness.  Respiratory: Negative for cough, chest tightness and shortness of breath.   Cardiovascular: Negative for chest pain and palpitations.  Gastrointestinal: Negative for abdominal pain, constipation, diarrhea, nausea and vomiting.  Endocrine: Negative.   Genitourinary: Negative for dysuria and frequency.  Musculoskeletal: Negative for arthralgias, back pain, joint swelling and neck pain.  Skin: Negative for rash.  Allergic/Immunologic: Negative.   Neurological: Negative for tremors and numbness.  Hematological: Negative for adenopathy. Does not bruise/bleed easily.  Psychiatric/Behavioral: Negative for behavioral problems and sleep disturbance. The patient is not nervous/anxious.     Vital Signs: BP 131/88   Pulse 80   Temp (!) 97.5 F (36.4 C)   Resp 16   Ht 6' (1.829 m)   Wt 165 lb (74.8 kg)   SpO2 99%   BMI 22.38 kg/m    Physical Exam Vitals and nursing note reviewed.   Constitutional:      General: She is not in acute distress.    Appearance: She is well-developed. She is not diaphoretic.  HENT:     Head: Normocephalic and atraumatic.     Mouth/Throat:     Pharynx: No oropharyngeal exudate.  Eyes:     Pupils: Pupils are equal, round, and reactive to light.  Neck:     Thyroid: No thyromegaly.     Vascular: No JVD.     Trachea: No tracheal deviation.  Cardiovascular:     Rate and Rhythm: Normal rate and regular rhythm.     Heart sounds: Normal heart sounds. No murmur. No friction rub. No gallop.   Pulmonary:     Effort: Pulmonary effort is normal. No respiratory distress.     Breath sounds: Normal breath sounds. No wheezing or rales.  Chest:     Chest wall: No tenderness.  Abdominal:     Palpations: Abdomen is soft.     Tenderness: There is no abdominal tenderness. There is no guarding.  Musculoskeletal:        General: Normal range of motion.     Cervical back: Normal range of motion and neck supple.  Lymphadenopathy:     Cervical: No cervical adenopathy.  Skin:    General: Skin is warm and dry.  Neurological:     Mental Status: She is alert and oriented to person, place, and time.     Cranial Nerves: No cranial nerve deficit.  Psychiatric:        Behavior: Behavior normal.        Thought Content: Thought content normal.        Judgment: Judgment normal.     Assessment/Plan: 1. Essential hypertension Stable, continue to monitor.   2. B12 deficiency Continue B12 injections as scheduled.  - cyanocobalamin ((VITAMIN B-12)) injection 1,000 mcg  3. Deep vein thrombosis (DVT) of axillary vein of left upper extremity, unspecified chronicity (HCC) Continue eliquis as directed.   4. Chronic nonintractable headache, unspecified headache type Continue to use Ubrelvy for break through migraines. Sample given.  5. Muscle spasm Given refill of flexeril for  muscle spasm.  - cyclobenzaprine (FLEXERIL) 5 MG tablet; Take 1 tablet (5 mg  total) by mouth 3 (three) times daily as needed for muscle spasms.  Dispense: 30 tablet; Refill: 1  6. Other chronic pain Refilled Lyrica.  - pregabalin (LYRICA) 50 MG capsule; Take 1 capsule (50 mg total) by mouth 3 (three) times daily.  Dispense: 90 capsule; Refill: 1  7. GAD (generalized anxiety disorder) At baseline, continue to monitor.  General Counseling: Stacy Pineda understanding of the findings of todays visit and agrees with plan of treatment. I have discussed any further diagnostic evaluation that may be needed or ordered today. We also reviewed her medications today. she has been encouraged to call the office with any questions or concerns that should arise related to todays visit.    No orders of the defined types were placed in this encounter.   Meds ordered this encounter  Medications  . cyanocobalamin ((VITAMIN B-12)) injection 1,000 mcg    Time spent: 25 Minutes   This patient was seen by Orson Gear AGNP-C in Collaboration with Dr Lavera Guise as a part of collaborative care agreement     Kendell Bane AGNP-C Internal medicine

## 2019-11-18 ENCOUNTER — Encounter: Payer: Medicare Other | Admitting: Certified Nurse Midwife

## 2019-11-21 DIAGNOSIS — F332 Major depressive disorder, recurrent severe without psychotic features: Secondary | ICD-10-CM | POA: Diagnosis not present

## 2019-11-21 DIAGNOSIS — F4312 Post-traumatic stress disorder, chronic: Secondary | ICD-10-CM | POA: Diagnosis not present

## 2019-11-21 DIAGNOSIS — F410 Panic disorder [episodic paroxysmal anxiety] without agoraphobia: Secondary | ICD-10-CM | POA: Diagnosis not present

## 2019-11-24 ENCOUNTER — Other Ambulatory Visit (HOSPITAL_COMMUNITY)
Admission: RE | Admit: 2019-11-24 | Discharge: 2019-11-24 | Disposition: A | Discharge status: Home / Self Care | Payer: Medicare Other | Source: Ambulatory Visit | Attending: Certified Nurse Midwife | Admitting: Certified Nurse Midwife

## 2019-11-24 ENCOUNTER — Ambulatory Visit (INDEPENDENT_AMBULATORY_CARE_PROVIDER_SITE_OTHER): Payer: Medicare Other

## 2019-11-24 ENCOUNTER — Other Ambulatory Visit: Payer: Self-pay

## 2019-11-24 ENCOUNTER — Other Ambulatory Visit: Payer: Self-pay | Admitting: Certified Nurse Midwife

## 2019-11-24 ENCOUNTER — Ambulatory Visit (INDEPENDENT_AMBULATORY_CARE_PROVIDER_SITE_OTHER): Payer: Medicare Other | Admitting: Certified Nurse Midwife

## 2019-11-24 ENCOUNTER — Encounter: Payer: Self-pay | Admitting: Certified Nurse Midwife

## 2019-11-24 VITALS — BP 135/89 | HR 96 | Ht 72.0 in | Wt 162.0 lb

## 2019-11-24 DIAGNOSIS — Z23 Encounter for immunization: Secondary | ICD-10-CM | POA: Diagnosis not present

## 2019-11-24 DIAGNOSIS — Z124 Encounter for screening for malignant neoplasm of cervix: Secondary | ICD-10-CM

## 2019-11-24 DIAGNOSIS — Z8742 Personal history of other diseases of the female genital tract: Secondary | ICD-10-CM

## 2019-11-24 DIAGNOSIS — Z3042 Encounter for surveillance of injectable contraceptive: Secondary | ICD-10-CM | POA: Diagnosis not present

## 2019-11-24 DIAGNOSIS — Z01419 Encounter for gynecological examination (general) (routine) without abnormal findings: Secondary | ICD-10-CM | POA: Insufficient documentation

## 2019-11-24 DIAGNOSIS — Z803 Family history of malignant neoplasm of breast: Secondary | ICD-10-CM

## 2019-11-24 DIAGNOSIS — R52 Pain, unspecified: Secondary | ICD-10-CM

## 2019-11-24 DIAGNOSIS — R232 Flushing: Secondary | ICD-10-CM

## 2019-11-24 DIAGNOSIS — R21 Rash and other nonspecific skin eruption: Secondary | ICD-10-CM | POA: Diagnosis not present

## 2019-11-24 DIAGNOSIS — Z1151 Encounter for screening for human papillomavirus (HPV): Secondary | ICD-10-CM | POA: Diagnosis not present

## 2019-11-24 DIAGNOSIS — N939 Abnormal uterine and vaginal bleeding, unspecified: Secondary | ICD-10-CM

## 2019-11-24 DIAGNOSIS — Z1231 Encounter for screening mammogram for malignant neoplasm of breast: Secondary | ICD-10-CM | POA: Diagnosis not present

## 2019-11-24 MED ORDER — CLOTRIMAZOLE-BETAMETHASONE 1-0.05 % EX CREA: 1.0000 "application " | TOPICAL_CREAM | Freq: Two times a day (BID) | CUTANEOUS | 0 refills | Status: DC

## 2019-11-24 MED ORDER — MEDROXYPROGESTERONE ACETATE 150 MG/ML IM SUSP: 150.0000 mg | INTRAMUSCULAR | 4 refills | Status: DC

## 2019-11-24 MED ORDER — TETANUS-DIPHTH-ACELL PERTUSSIS 5-2.5-18.5 LF-MCG/0.5 IM SUSP
0.5000 mL | Freq: Once | INTRAMUSCULAR | Status: AC
  Administered 2019-11-24: 0.5 mL via INTRAMUSCULAR

## 2019-11-24 NOTE — Patient Instructions (Signed)
Perimenopause  Perimenopause is the normal time of life before and after menstrual periods stop completely (menopause). Perimenopause can begin 2-8 years before menopause, and it usually lasts for 1 year after menopause. During perimenopause, the ovaries may or may not produce an egg. What are the causes? This condition is caused by a natural change in hormone levels that happens as you get older. What increases the risk? This condition is more likely to start at an earlier age if you have certain medical conditions or treatments, including:  A tumor of the pituitary gland in the brain.  A disease that affects the ovaries and hormone production.  Radiation treatment for cancer.  Certain cancer treatments, such as chemotherapy or hormone (anti-estrogen) therapy.  Heavy smoking and excessive alcohol use.  Family history of early menopause. What are the signs or symptoms? Perimenopausal changes affect each woman differently. Symptoms of this condition may include:  Hot flashes.  Night sweats.  Irregular menstrual periods.  Decreased sex drive.  Vaginal dryness.  Headaches.  Mood swings.  Depression.  Memory problems or trouble concentrating.  Irritability.  Tiredness.  Weight gain.  Anxiety.  Trouble getting pregnant. How is this diagnosed? This condition is diagnosed based on your medical history, a physical exam, your age, your menstrual history, and your symptoms. Hormone tests may also be done. How is this treated? In some cases, no treatment is needed. You and your health care provider should make a decision together about whether treatment is necessary. Treatment will be based on your individual condition and preferences. Various treatments are available, such as:  Menopausal hormone therapy (MHT).  Medicines to treat specific symptoms.  Acupuncture.  Vitamin or herbal supplements. Before starting treatment, make sure to let your health care provider  know if you have a personal or family history of:  Heart disease.  Breast cancer.  Blood clots.  Diabetes.  Osteoporosis. Follow these instructions at home: Lifestyle  Do not use any products that contain nicotine or tobacco, such as cigarettes and e-cigarettes. If you need help quitting, ask your health care provider.  Eat a balanced diet that includes fresh fruits and vegetables, whole grains, soybeans, eggs, lean meat, and low-fat dairy.  Get at least 30 minutes of physical activity on 5 or more days each week.  Avoid alcoholic and caffeinated beverages, as well as spicy foods. This may help prevent hot flashes.  Get 7-8 hours of sleep each night.  Dress in layers that can be removed to help you manage hot flashes.  Find ways to manage stress, such as deep breathing, meditation, or journaling. General instructions  Keep track of your menstrual periods, including: ? When they occur. ? How heavy they are and how long they last. ? How much time passes between periods.  Keep track of your symptoms, noting when they start, how often you have them, and how long they last.  Take over-the-counter and prescription medicines only as told by your health care provider.  Take vitamin supplements only as told by your health care provider. These may include calcium, vitamin E, and vitamin D.  Use vaginal lubricants or moisturizers to help with vaginal dryness and improve comfort during sex.  Talk with your health care provider before starting any herbal supplements.  Keep all follow-up visits as told by your health care provider. This is important. This includes any group therapy or counseling. Contact a health care provider if:  You have heavy vaginal bleeding or pass blood clots.  Your period  lasts more than 2 days longer than normal.  Your periods are recurring sooner than 21 days.  You bleed after having sex. Get help right away if:  You have chest pain, trouble  breathing, or trouble talking.  You have severe depression.  You have pain when you urinate.  You have severe headaches.  You have vision problems. Summary  Perimenopause is the time when a woman's body begins to move into menopause. This may happen naturally or as a result of other health problems or medical treatments.  Perimenopause can begin 2-8 years before menopause, and it usually lasts for 1 year after menopause.  Perimenopausal symptoms can be managed through medicines, lifestyle changes, and complementary therapies such as acupuncture. This information is not intended to replace advice given to you by your health care provider. Make sure you discuss any questions you have with your health care provider. Document Revised: 07/10/2017 Document Reviewed: 09/02/2016 Elsevier Patient Education  [place].   Rash, Adult  A rash is a change in the color of your skin. A rash can also change the way your skin feels. There are many different conditions and factors that can cause a rash. Follow these instructions at home: The goal of treatment is to stop the itching and keep the rash from spreading. Watch for any changes in your symptoms. Let your doctor know about them. Follow these instructions to help with your condition: Medicine Take or apply over-the-counter and prescription medicines only as told by your doctor. These may include medicines:  To treat red or swollen skin (corticosteroid creams).  To treat itching.  To treat an allergy (oral antihistamines).  To treat very bad symptoms (oral corticosteroids).  Skin care  Put cool cloths (compresses) on the affected areas.  Do not scratch or rub your skin.  Avoid covering the rash. Make sure that the rash is exposed to air as much as possible. Managing itching and discomfort  Avoid hot showers or baths. These can make itching worse. A cold shower may help.  Try taking a bath with: ? Epsom salts. You can get  these at your local pharmacy or grocery store. Follow the instructions on the package. ? Baking soda. Pour a small amount into the bath as told by your doctor. ? Colloidal oatmeal. You can get this at your local pharmacy or grocery store. Follow the instructions on the package.  Try putting baking soda paste onto your skin. Stir water into baking soda until it gets like a paste.  Try putting on a lotion that relieves itchiness (calamine lotion).  Keep cool and out of the sun. Sweating and being hot can make itching worse. General instructions   Rest as needed.  Drink enough fluid to keep your pee (urine) pale yellow.  Wear loose-fitting clothing.  Avoid scented soaps, detergents, and perfumes. Use gentle soaps, detergents, perfumes, and other cosmetic products.  Avoid anything that causes your rash. Keep a journal to help track what causes your rash. Write down: ? What you eat. ? What cosmetic products you use. ? What you drink. ? What you wear. This includes jewelry.  Keep all follow-up visits as told by your doctor. This is important. Contact a doctor if:  You sweat at night.  You lose weight.  You pee (urinate) more than normal.  You pee less than normal, or you notice that your pee is a darker color than normal.  You feel weak.  You throw up (vomit).  Your skin or  the whites of your eyes look yellow (jaundice).  Your skin: ? Tingles. ? Is numb.  Your rash: ? Does not go away after a few days. ? Gets worse.  You are: ? More thirsty than normal. ? More tired than normal.  You have: ? New symptoms. ? Pain in your belly (abdomen). ? A fever. ? Watery poop (diarrhea). Get help right away if:  You have a fever and your symptoms suddenly get worse.  You start to feel mixed up (confused).  You have a very bad headache or a stiff neck.  You have very bad joint pains or stiffness.  You have jerky movements that you cannot control (seizure).  Your rash  covers all or most of your body. The rash may or may not be painful.  You have blisters that: ? Are on top of the rash. ? Grow larger. ? Grow together. ? Are painful. ? Are inside your nose or mouth.  You have a rash that: ? Looks like purple pinprick-sized spots all over your body. ? Has a "bull's eye" or looks like a target. ? Is red and painful, causes your skin to peel, and is not from being in the sun too long. Summary  A rash is a change in the color of your skin. A rash can also change the way your skin feels.  The goal of treatment is to stop the itching and keep the rash from spreading.  Take or apply over-the-counter and prescription medicines only as told by your doctor.  Contact a doctor if you have new symptoms or symptoms that get worse.  Keep all follow-up visits as told by your doctor. This is important. This information is not intended to replace advice given to you by your health care provider. Make sure you discuss any questions you have with your health care provider. Document Revised: 11/19/2018 Document Reviewed: 03/01/2018 Elsevier Patient Education  2020 [employer]   Preventive Care 14-63 Years Old, Female Preventive care refers to visits with your health care provider and lifestyle choices that can promote health and wellness. This includes:  A yearly physical exam. This may also be called an annual well check.  Regular dental visits and eye exams.  Immunizations.  Screening for certain conditions.  Healthy lifestyle choices, such as eating a healthy diet, getting regular exercise, not using drugs or products that contain nicotine and tobacco, and limiting alcohol use. What can I expect for my preventive care visit? Physical exam Your health care provider will check your:  Height and weight. This may be used to calculate body mass index (BMI), which tells if you are at a healthy weight.  Heart rate and blood pressure.  Skin for abnormal  spots. Counseling Your health care provider may ask you questions about your:  Alcohol, tobacco, and drug use.  Emotional well-being.  Home and relationship well-being.  Sexual activity.  Eating habits.  Work and work Statistician.  Method of birth control.  Menstrual cycle.  Pregnancy history. What immunizations do I need?  Influenza (flu) vaccine  This is recommended every year. Tetanus, diphtheria, and pertussis (Tdap) vaccine  You may need a Td booster every 10 years. Varicella (chickenpox) vaccine  You may need this if you have not been vaccinated. Zoster (shingles) vaccine  You may need this after age 40. Measles, mumps, and rubella (MMR) vaccine  You may need at least one dose of MMR if you were born in 1957 or later. You may also need  a second dose. Pneumococcal conjugate (PCV13) vaccine  You may need this if you have certain conditions and were not previously vaccinated. Pneumococcal polysaccharide (PPSV23) vaccine  You may need one or two doses if you smoke cigarettes or if you have certain conditions. Meningococcal conjugate (MenACWY) vaccine  You may need this if you have certain conditions. Hepatitis A vaccine  You may need this if you have certain conditions or if you travel or work in places where you may be exposed to hepatitis A. Hepatitis B vaccine  You may need this if you have certain conditions or if you travel or work in places where you may be exposed to hepatitis B. Haemophilus influenzae type b (Hib) vaccine  You may need this if you have certain conditions. Human papillomavirus (HPV) vaccine  If recommended by your health care provider, you may need three doses over 6 months. You may receive vaccines as individual doses or as more than one vaccine together in one shot (combination vaccines). Talk with your health care provider about the risks and benefits of combination vaccines. What tests do I need? Blood tests  Lipid and  cholesterol levels. These may be checked every 5 years, or more frequently if you are over 44 years old.  Hepatitis C test.  Hepatitis B test. Screening  Lung cancer screening. You may have this screening every year starting at age 69 if you have a 30-pack-year history of smoking and currently smoke or have quit within the past 15 years.  Colorectal cancer screening. All adults should have this screening starting at age 21 and continuing until age 88. Your health care provider may recommend screening at age 65 if you are at increased risk. You will have tests every 1-10 years, depending on your results and the type of screening test.  Diabetes screening. This is done by checking your blood sugar (glucose) after you have not eaten for a while (fasting). You may have this done every 1-3 years.  Mammogram. This may be done every 1-2 years. Talk with your health care provider about when you should start having regular mammograms. This may depend on whether you have a family history of breast cancer.  BRCA-related cancer screening. This may be done if you have a family history of breast, ovarian, tubal, or peritoneal cancers.  Pelvic exam and Pap test. This may be done every 3 years starting at age 27. Starting at age 50, this may be done every 5 years if you have a Pap test in combination with an HPV test. Other tests  Sexually transmitted disease (STD) testing.  Bone density scan. This is done to screen for osteoporosis. You may have this scan if you are at high risk for osteoporosis. Follow these instructions at home: Eating and drinking  Eat a diet that includes fresh fruits and vegetables, whole grains, lean protein, and low-fat dairy.  Take vitamin and mineral supplements as recommended by your health care provider.  Do not drink alcohol if: ? Your health care provider tells you not to drink. ? You are pregnant, may be pregnant, or are planning to become pregnant.  If you drink  alcohol: ? Limit how much you have to 0-1 drink a day. ? Be aware of how much alcohol is in your drink. In the [place] one drink equals one 12 oz bottle of beer (355 mL), one 5 oz glass of wine (148 mL), or one 1 oz glass of hard liquor (44 mL). Lifestyle  Take daily care  of your teeth and gums.  Stay active. Exercise for at least 30 minutes on 5 or more days each week.  Do not use any products that contain nicotine or tobacco, such as cigarettes, e-cigarettes, and chewing tobacco. If you need help quitting, ask your health care provider.  If you are sexually active, practice safe sex. Use a condom or other form of birth control (contraception) in order to prevent pregnancy and STIs (sexually transmitted infections).  If told by your health care provider, take low-dose aspirin daily starting at age 67. What's next?  Visit your health care provider once a year for a well check visit.  Ask your health care provider how often you should have your eyes and teeth checked.  Stay up to date on all vaccines. This information is not intended to replace advice given to you by your health care provider. Make sure you discuss any questions you have with your health care provider. Document Revised: 04/08/2018 Document Reviewed: 04/08/2018 Elsevier Patient Education  2020 [employer].

## 2019-11-24 NOTE — Progress Notes (Signed)
ANNUAL PREVENTATIVE CARE GYN  ENCOUNTER NOTE  Subjective:       Stacy Pineda is a 39 y.o. G61P0010 female here for a routine annual gynecologic exam.  Current complaints: 1.  Needs Pap smear  Denies difficulty breathing or respiratory distress, chest pain, abdominal pain, excessive vaginal bleeding, dysuria, and leg pain or swelling.    Gynecologic History  No LMP recorded. Patient has had an injection.  Contraception: abstinence and Depo-Provera injections   DEXA Scan: 10/2017. Results were: Normal  Last Pap: 03/2015. Results were: Neg/Neg  Last mammogram: due  Obstetric History  OB History  Gravida Para Term Preterm AB Living  1       1    SAB TAB Ectopic Multiple Live Births  1            # Outcome Date GA Lbr Len/2nd Weight Sex Delivery Anes PTL Lv  1 SAB 1999            Past Medical History:  Diagnosis Date  . Anxiety   . Asthma   . Bilateral ovarian cysts   . Chronic headaches   . Depression   . Hypothyroidism   . Pancreatitis   . Pancreatitis   . PTSD (post-traumatic stress disorder)     Past Surgical History:  Procedure Laterality Date  . ABDOMINAL SURGERY    . CHOLECYSTECTOMY    . COLONOSCOPY WITH PROPOFOL N/A 09/08/2018   Procedure: COLONOSCOPY WITH PROPOFOL;  Surgeon: Virgel Manifold, MD;  Location: [facility];  Service: Endoscopy;  Laterality: N/A;  . DECUBITUS ULCER EXCISION    . DILATION AND CURETTAGE OF UTERUS    . ESOPHAGOGASTRODUODENOSCOPY (EGD) WITH PROPOFOL N/A 09/08/2018   Procedure: ESOPHAGOGASTRODUODENOSCOPY (EGD) WITH PROPOFOL;  Surgeon: Virgel Manifold, MD;  Location: [facility];  Service: Endoscopy;  Laterality: N/A;  . GASTRIC BYPASS    . GASTRIC BYPASS    . REVISION GASTRIC RESTRICTIVE PROCEDURE FOR MORBID OBESITY      Current Outpatient Medications on File Prior to Visit  Medication Sig Dispense Refill  . medroxyPROGESTERone (DEPO-PROVERA) 150 MG/ML injection Inject 1 mL (150 mg total) into the muscle every 3  (three) months. 1 mL 0  . Multiple Vitamins-Minerals (MULTIVITAMIN ADULT PO) Take 1 tablet by mouth.    . QUEtiapine (SEROQUEL) 25 MG tablet Take 25 mg by mouth at bedtime.    . selegiline (EMSAM) 6 MG/24HR Place 6 mg onto the skin daily.    Marland Kitchen albuterol (PROVENTIL) (2.5 MG/3ML) 0.083% nebulizer solution Take 3 mLs (2.5 mg total) by nebulization every 6 (six) hours as needed for wheezing or shortness of breath. 150 mL 1  . albuterol (VENTOLIN HFA) 108 (90 Base) MCG/ACT inhaler Inhale 2 puffs into the lungs every 6 (six) hours as needed for wheezing or shortness of breath. 18 g 1  . amoxicillin-clavulanate (AUGMENTIN) 400-57 MG/5ML suspension Take 10 mLs (800 mg total) by mouth 2 (two) times daily. (Patient not taking: Reported on 11/24/2019) 200 mL 0  . apixaban (ELIQUIS) 5 MG TABS tablet Take 1 tablet (5 mg total) by mouth 2 (two) times daily. (Patient not taking: Reported on 11/24/2019) 60 tablet 1  . ASMANEX HFA 100 MCG/ACT AERO Inhale 2 puffs into the lungs daily.     . clonazePAM (KLONOPIN) 1 MG tablet Take 1 mg by mouth 2 (two) times daily.    . cyclobenzaprine (FLEXERIL) 5 MG tablet Take 1 tablet (5 mg total) by mouth 3 (three) times daily as needed for muscle  spasms. (Patient not taking: Reported on 11/24/2019) 30 tablet 1  . fentaNYL (DURAGESIC) 25 MCG/HR Place 1 patch onto the skin every 3 (three) days. (Patient not taking: Reported on 11/24/2019) 10 patch 0  . levothyroxine (SYNTHROID) 25 MCG tablet Take 1 tablet (25 mcg total) by mouth daily before breakfast. 90 tablet 1  . mirtazapine (REMERON) 45 MG tablet Take 1 tablet (45 mg total) by mouth at bedtime. 90 tablet 0  . ondansetron (ZOFRAN-ODT) 8 MG disintegrating tablet Take 1 tablet (8 mg total) by mouth every 8 (eight) hours as needed for nausea or vomiting. (Patient not taking: Reported on 11/24/2019) 30 tablet 1  . potassium chloride (K-DUR) 10 MEQ tablet Take 1 tablet (10 mEq total) by mouth daily. 30 tablet 3  . prazosin (MINIPRESS) 5  MG capsule TAKE 1 CAPSULE BY MOUTH NIGHTLY    . pregabalin (LYRICA) 50 MG capsule Take 1 capsule (50 mg total) by mouth 3 (three) times daily. (Patient not taking: Reported on 11/24/2019) 90 capsule 1  . promethazine (PHENERGAN) 25 MG tablet Take 1 tablet (25 mg total) by mouth every 8 (eight) hours as needed for nausea or vomiting. 60 tablet 1  . promethazine (PHENERGAN) 6.25 MG/5ML syrup TAKE  10 ML BY MOUTH EVERY 8 HOURS AS NEEDED FOR NAUSEA AND VOMITING OR  REFRACTORY  NAUSEA/VOMITING (Patient not taking: Reported on 11/24/2019) 240 mL 2  . venlafaxine XR (EFFEXOR-XR) 150 MG 24 hr capsule TAKE 2 CAPSULES BY MOUTH IN THE MORNING    . Vitamin D, Ergocalciferol, (DRISDOL) 1.25 MG (50000 UNIT) CAPS capsule Take 1 capsule (50,000 Units total) by mouth every 7 (seven) days. (Patient not taking: Reported on 11/24/2019) 10 capsule 0   No current facility-administered medications on file prior to visit.    Allergies  Allergen Reactions  . Fentanyl Itching  . Morphine And Related Itching    Itchy.   . Vicodin [Hydrocodone-Acetaminophen] Itching    Social History   Socioeconomic History  . Marital status: Single    Spouse name: Not on file  . Number of children: Not on file  . Years of education: Not on file  . Highest education level: Not on file  Occupational History  . Not on file  Tobacco Use  . Smoking status: Current Some Day Smoker    Packs/day: 0.25    Types: Cigars, E-cigarettes, Cigarettes    Start date: 03/26/2005  . Smokeless tobacco: Never Used  Substance and Sexual Activity  . Alcohol use: No    Alcohol/week: 0.0 standard drinks  . Drug use: Yes    Frequency: 3.0 times per week    Types: Marijuana    Comment: MJ use weekly  . Sexual activity: Not Currently    Birth control/protection: Injection  Other Topics Concern  . Not on file  Social History Narrative  . Not on file   Social Determinants of Health   Financial Resource Strain:   . Difficulty of Paying Living  Expenses:   Food Insecurity:   . Worried About Charity fundraiser in the Last Year:   . Arboriculturist in the Last Year:   Transportation Needs:   . Film/video editor (Medical):   Marland Kitchen Lack of Transportation (Non-Medical):   Physical Activity:   . Days of Exercise per Week:   . Minutes of Exercise per Session:   Stress:   . Feeling of Stress :   Social Connections:   . Frequency of Communication with Friends and  Family:   . Frequency of Social Gatherings with Friends and Family:   . Attends Religious Services:   . Active Member of Clubs or Organizations:   . Attends Archivist Meetings:   Marland Kitchen Marital Status:   Intimate Partner Violence:   . Fear of Current or Ex-Partner:   . Emotionally Abused:   Marland Kitchen Physically Abused:   . Sexually Abused:     Family History  Problem Relation Age of Onset  . Graves' disease Mother   . Hypertension Mother   . Hyperlipidemia Mother   . Anxiety disorder Father   . Prostate cancer Father   . Depression Brother   . Anxiety disorder Brother   . Anxiety disorder Maternal Aunt   . Depression Maternal Aunt   . Anxiety disorder Paternal Aunt   . Depression Paternal Aunt   . Anxiety disorder Maternal Uncle   . Depression Maternal Uncle   . Anxiety disorder Paternal Uncle   . Depression Paternal Uncle   . Anxiety disorder Maternal Grandfather   . Depression Maternal Grandfather   . Diabetes Maternal Grandfather   . Anxiety disorder Maternal Grandmother   . Depression Maternal Grandmother   . Breast cancer Maternal Grandmother   . Ovarian cancer Maternal Grandmother   . Diabetes Maternal Grandmother   . Colon cancer Neg Hx     The following portions of the patient's history were reviewed and updated as appropriate: allergies, current medications, past family history, past medical history, past social history, past surgical history and problem list.  Review of Systems  ROS negative except as noted above. Information obtained from  patient.    Objective:   BP 135/89   Pulse 96   Ht 6' (1.829 m)   Wt 162 lb (73.5 kg)   BMI 21.97 kg/m    CONSTITUTIONAL: Well-developed, well-nourished female in no acute distress.   PSYCHIATRIC: Normal mood and affect. Normal behavior. Normal judgment and thought content.  [place]: Alert and oriented to person, place, and time. Normal muscle tone coordination. No cranial nerve deficit noted.  HENT:  Normocephalic, atraumatic, External right and left ear normal.  EYES: Conjunctivae and EOM are normal. Pupils are equal and round.   NECK: Normal range of motion, supple, no masses.  Normal thyroid.   SKIN: Skin is warm and dry. No rash noted. Not diaphoretic. No erythema. No pallor. Professional tattoos present.   CARDIOVASCULAR: Normal heart rate noted, regular rhythm, no murmur.  RESPIRATORY: Clear to auscultation bilaterally. Effort and breath sounds normal, no problems with respiration noted.  BREASTS: Symmetric in size. No masses, skin changes, nipple drainage, or lymphadenopathy.  ABDOMEN: Soft, normal bowel sounds, no distention noted.  No tenderness, rebound or guarding.   PELVIC:  External Genitalia: Normal  Vagina: Normal  Cervix: Normal, Pap collected  Uterus: Normal  Adnexa: Normal   MUSCULOSKELETAL: Normal range of motion. No tenderness.  No cyanosis, clubbing, or edema.  2+ distal pulses.  LYMPHATIC: No Axillary, Supraclavicular, or Inguinal Adenopathy.   ULTRASOUND REPORT  Location: [facility]  Date of Service: 11/24/2019     Indications:Pelvic Pain   Findings:  The uterus is anteverted and measures 6.9 x 3.3 x 4.9 cm. Echo texture is homogenous without evidence of focal masses.  The Endometrium measures 4 mm.  Right Ovary measures 2.7 x 1.8 x 2.2 cm. It is normal in appearance.Hypoechoic lesion with smooth walls and good through transmission  measuring 1.9 x 1.0 x 1.2 cm Left Ovary measures  3.3 x 1.8 x 2.8 cm. It is normal in appearance.m. It is normal in appearance. 2 hypoechoic lesion with smooth walls and good through                                                                                                                         transmission  1) measuring 1.6 x 1.1 x 1.3 cm  2) measuring 0.8 cm Survey of the adnexa demonstrates no adnexal masses. There is no free fluid in the cul de sac.  Impression: 1. Bilateral simple ovarian cysts. 2.Survey of the adnexa demonstrates no adnexal masses.  Recommendations: 1.Clinical correlation with the patient's History and Physical Exam.  Assessment:   Annual gynecologic examination 39 y.o.   Contraception: abstinence and Depo-Provera injections   Normal BMI   Problem List Items Addressed This Visit      Other   Family history of breast cancer   Relevant Orders   MM 3D SCREEN BREAST BILATERAL   History of ovarian cyst    Other Visit Diagnoses    Well woman exam    -  Primary   Relevant Orders   Cytology - PAP   MM 3D SCREEN BREAST BILATERAL   Need for immunization with diphtheria, tetanus, and poliovirus vaccine       Screening for cervical cancer       Relevant Orders   Cytology - PAP   Encounter for screening mammogram for malignant neoplasm of breast       Relevant Orders   MM 3D SCREEN BREAST BILATERAL   Depo-Provera contraceptive status       Skin rash       Hot flashes          Plan:   Pap: Pap Co Test  Mammogram: Ordered  Labs: Managed by PCP  Routine preventative health maintenance measures emphasized: Exercise/Diet/Weight control, Tobacco Warnings, Alcohol/Substance use risks, Stress Management and Safe Sex; See AVS  Reviewed red flag symptoms and when to call  Return to [place] for [place] or sooner if needed   Dondra Spry, [place], CNM [facility], [facility]

## 2019-11-25 ENCOUNTER — Telehealth: Payer: Self-pay

## 2019-11-25 NOTE — Telephone Encounter (Signed)
Confirmed appointment on 11/29/2019 and screened for [email or web address] 

## 2019-11-28 LAB — CYTOLOGY - PAP
Adequacy: ABSENT
Comment: NEGATIVE
Diagnosis: NEGATIVE
High risk HPV: NEGATIVE

## 2019-11-29 ENCOUNTER — Ambulatory Visit: Payer: Medicare Other | Admitting: Adult Health

## 2019-12-12 ENCOUNTER — Telehealth: Payer: Self-pay

## 2019-12-12 NOTE — Telephone Encounter (Signed)
Called lmom informing patient of appointment on 12/14/2019. klh

## 2019-12-14 ENCOUNTER — Ambulatory Visit (INDEPENDENT_AMBULATORY_CARE_PROVIDER_SITE_OTHER): Payer: Medicare Other | Admitting: Adult Health

## 2019-12-14 ENCOUNTER — Encounter: Payer: Self-pay | Admitting: Adult Health

## 2019-12-14 ENCOUNTER — Other Ambulatory Visit: Payer: Self-pay

## 2019-12-14 VITALS — BP 128/82 | HR 79 | Temp 97.5°F | Resp 16 | Ht 73.0 in | Wt 160.0 lb

## 2019-12-14 DIAGNOSIS — I82A12 Acute embolism and thrombosis of left axillary vein: Secondary | ICD-10-CM | POA: Diagnosis not present

## 2019-12-14 DIAGNOSIS — F17210 Nicotine dependence, cigarettes, uncomplicated: Secondary | ICD-10-CM

## 2019-12-14 DIAGNOSIS — G8929 Other chronic pain: Secondary | ICD-10-CM | POA: Diagnosis not present

## 2019-12-14 DIAGNOSIS — E538 Deficiency of other specified B group vitamins: Secondary | ICD-10-CM

## 2019-12-14 DIAGNOSIS — E559 Vitamin D deficiency, unspecified: Secondary | ICD-10-CM | POA: Diagnosis not present

## 2019-12-14 DIAGNOSIS — I10 Essential (primary) hypertension: Secondary | ICD-10-CM | POA: Diagnosis not present

## 2019-12-14 DIAGNOSIS — R109 Unspecified abdominal pain: Secondary | ICD-10-CM

## 2019-12-14 MED ORDER — FENTANYL 25 MCG/HR TD PT72: 1.0000 | MEDICATED_PATCH | TRANSDERMAL | 0 refills | Status: DC

## 2019-12-14 NOTE — Progress Notes (Signed)
[facility] [place], [place] [address]  Internal MEDICINE  Office Visit Note  Patient Name: Stacy Pineda  [id number]  ZN:[id number]  Date of Service: 12/14/2019  Chief Complaint  Patient presents with  . Follow-up  . Depression    HPI  Pt is here for follow up on depression, HTN, and chronic pain. Her blood pressure is well controlled at this time.  Denies Chest pain, Shortness of breath, palpitations, headache, or blurred vision. She reports her depression is at baseline, and she appears in good spirits.  She has been off chronic fentanyl patches for some time.  She has been using lyrica.  She feels like her chronic abdominal pain is returning. She continues to have issues with her left shoulder, where she had an axilla DVT.     Current Medication: Outpatient Encounter Medications as of 12/14/2019  Medication Sig  . apixaban (ELIQUIS) 5 MG TABS tablet Take 1 tablet (5 mg total) by mouth 2 (two) times daily.  . clotrimazole-betamethasone (LOTRISONE) cream Apply 1 application topically 2 (two) times daily.  . cyclobenzaprine (FLEXERIL) 5 MG tablet Take 1 tablet (5 mg total) by mouth 3 (three) times daily as needed for muscle spasms.  . fentaNYL (DURAGESIC) 25 MCG/HR Place 1 patch onto the skin every 3 (three) days.  . medroxyPROGESTERone (DEPO-PROVERA) 150 MG/ML injection Inject 1 mL (150 mg total) into the muscle every 3 (three) months.  . Multiple Vitamins-Minerals (MULTIVITAMIN ADULT PO) Take 1 tablet by mouth.  . ondansetron (ZOFRAN-ODT) 8 MG disintegrating tablet Take 1 tablet (8 mg total) by mouth every 8 (eight) hours as needed for nausea or vomiting.  . pregabalin (LYRICA) 50 MG capsule Take 1 capsule (50 mg total) by mouth 3 (three) times daily.  . promethazine (PHENERGAN) 6.25 MG/5ML syrup TAKE  10 ML BY MOUTH EVERY 8 HOURS AS NEEDED FOR NAUSEA AND VOMITING OR  REFRACTORY  NAUSEA/VOMITING  . QUEtiapine (SEROQUEL) 25 MG tablet Take 25 mg by mouth at  bedtime.  . selegiline (EMSAM) 6 MG/24HR Place 6 mg onto the skin daily.  . Vitamin D, Ergocalciferol, (DRISDOL) 1.25 MG (50000 UNIT) CAPS capsule Take 1 capsule (50,000 Units total) by mouth every 7 (seven) days.  . [DISCONTINUED] amoxicillin-clavulanate (AUGMENTIN) 400-57 MG/5ML suspension Take 10 mLs (800 mg total) by mouth 2 (two) times daily. (Patient not taking: Reported on 11/24/2019)   No facility-administered encounter medications on file as of 12/14/2019.    Surgical History: Past Surgical History:  Procedure Laterality Date  . ABDOMINAL SURGERY    . CHOLECYSTECTOMY    . COLONOSCOPY WITH PROPOFOL N/A 09/08/2018   Procedure: COLONOSCOPY WITH PROPOFOL;  Surgeon: Virgel Manifold, MD;  Location: [facility];  Service: Endoscopy;  Laterality: N/A;  . DECUBITUS ULCER EXCISION    . DILATION AND CURETTAGE OF UTERUS    . ESOPHAGOGASTRODUODENOSCOPY (EGD) WITH PROPOFOL N/A 09/08/2018   Procedure: ESOPHAGOGASTRODUODENOSCOPY (EGD) WITH PROPOFOL;  Surgeon: Virgel Manifold, MD;  Location: [facility];  Service: Endoscopy;  Laterality: N/A;  . GASTRIC BYPASS    . GASTRIC BYPASS    . REVISION GASTRIC RESTRICTIVE PROCEDURE FOR MORBID OBESITY      Medical History: Past Medical History:  Diagnosis Date  . Anxiety   . Asthma   . Bilateral ovarian cysts   . Chronic headaches   . Depression   . Hypothyroidism   . Pancreatitis   . Pancreatitis   . PTSD (post-traumatic stress disorder)     Family History: Family  History  Problem Relation Age of Onset  . Graves' disease Mother   . Hypertension Mother   . Hyperlipidemia Mother   . Anxiety disorder Father   . Prostate cancer Father   . Depression Brother   . Anxiety disorder Brother   . Anxiety disorder Maternal Aunt   . Depression Maternal Aunt   . Anxiety disorder Paternal Aunt   . Depression Paternal Aunt   . Anxiety disorder Maternal Uncle   . Depression Maternal Uncle   . Anxiety disorder Paternal Uncle   .  Depression Paternal Uncle   . Anxiety disorder Maternal Grandfather   . Depression Maternal Grandfather   . Diabetes Maternal Grandfather   . Anxiety disorder Maternal Grandmother   . Depression Maternal Grandmother   . Breast cancer Maternal Grandmother   . Ovarian cancer Maternal Grandmother   . Diabetes Maternal Grandmother   . Colon cancer Neg Hx     Social History   Socioeconomic History  . Marital status: Single    Spouse name: Not on file  . Number of children: Not on file  . Years of education: Not on file  . Highest education level: Not on file  Occupational History  . Not on file  Tobacco Use  . Smoking status: Current Some Day Smoker    Packs/day: 0.25    Types: Cigarettes    Start date: 03/26/2005  . Smokeless tobacco: Never Used  Substance and Sexual Activity  . Alcohol use: No    Alcohol/week: 0.0 standard drinks  . Drug use: Yes    Frequency: 3.0 times per week    Types: Marijuana    Comment: MJ use weekly  . Sexual activity: Not Currently    Birth control/protection: Injection  Other Topics Concern  . Not on file  Social History Narrative  . Not on file   Social Determinants of Health   Financial Resource Strain:   . Difficulty of Paying Living Expenses:   Food Insecurity:   . Worried About Charity fundraiser in the Last Year:   . Arboriculturist in the Last Year:   Transportation Needs:   . Film/video editor (Medical):   Marland Kitchen Lack of Transportation (Non-Medical):   Physical Activity:   . Days of Exercise per Week:   . Minutes of Exercise per Session:   Stress:   . Feeling of Stress :   Social Connections:   . Frequency of Communication with Friends and Family:   . Frequency of Social Gatherings with Friends and Family:   . Attends Religious Services:   . Active Member of Clubs or Organizations:   . Attends Archivist Meetings:   Marland Kitchen Marital Status:   Intimate Partner Violence:   . Fear of Current or Ex-Partner:   .  Emotionally Abused:   Marland Kitchen Physically Abused:   . Sexually Abused:       Review of Systems  Constitutional: Negative for chills, fatigue and unexpected weight change.  HENT: Negative for congestion, rhinorrhea, sneezing and sore throat.   Eyes: Negative for photophobia, pain and redness.  Respiratory: Negative for cough, chest tightness and shortness of breath.   Cardiovascular: Negative for chest pain and palpitations.  Gastrointestinal: Negative for abdominal pain, constipation, diarrhea, nausea and vomiting.  Endocrine: Negative.   Genitourinary: Negative for dysuria and frequency.  Musculoskeletal: Negative for arthralgias, back pain, joint swelling and neck pain.  Skin: Negative for rash.  Allergic/Immunologic: Negative.   Neurological: Negative for tremors  and numbness.  Hematological: Negative for adenopathy. Does not bruise/bleed easily.  Psychiatric/Behavioral: Negative for behavioral problems and sleep disturbance. The patient is not nervous/anxious.     Vital Signs: BP (!) 141/94   Pulse 79   Temp (!) 97.5 F (36.4 C)   Resp 16   Ht 6\' 1"  (1.854 m)   Wt 160 lb (72.6 kg)   SpO2 97%   BMI 21.11 kg/m    Physical Exam Vitals and nursing note reviewed.  Constitutional:      General: She is not in acute distress.    Appearance: She is well-developed. She is not diaphoretic.  HENT:     Head: Normocephalic and atraumatic.     Mouth/Throat:     Pharynx: No oropharyngeal exudate.  Eyes:     Pupils: Pupils are equal, round, and reactive to light.  Neck:     Thyroid: No thyromegaly.     Vascular: No JVD.     Trachea: No tracheal deviation.  Cardiovascular:     Rate and Rhythm: Normal rate and regular rhythm.     Heart sounds: Normal heart sounds. No murmur. No friction rub. No gallop.   Pulmonary:     Effort: Pulmonary effort is normal. No respiratory distress.     Breath sounds: Normal breath sounds. No wheezing or rales.  Chest:     Chest wall: No tenderness.   Abdominal:     Palpations: Abdomen is soft.     Tenderness: There is no abdominal tenderness. There is no guarding.  Musculoskeletal:        General: Normal range of motion.     Cervical back: Normal range of motion and neck supple.  Lymphadenopathy:     Cervical: No cervical adenopathy.  Skin:    General: Skin is warm and dry.  Neurological:     Mental Status: She is alert and oriented to person, place, and time.     Cranial Nerves: No cranial nerve deficit.  Psychiatric:        Behavior: Behavior normal.        Thought Content: Thought content normal.        Judgment: Judgment normal.    Assessment/Plan: 1. Essential hypertension Controlled, continue HTN as discussed.  2. Deep vein thrombosis (DVT) of axillary vein of left upper extremity, unspecified chronicity (HCC) Discussed repeat US, patient is no longer on Eliquis.   3. Other chronic pain May stop lyrica and use fenanyl as discussed.  - fentaNYL (DURAGESIC) 25 MCG/HR; Place 1 patch onto the skin every 3 (three) days.  Dispense: 10 patch; Refill: 0  4. Chronic abdominal pain May go back to Fentanyl patches as discussed.  - fentaNYL (DURAGESIC) 25 MCG/HR; Place 1 patch onto the skin every 3 (three) days.  Dispense: 10 patch; Refill: 0  5. B12 deficiency Continue B12 injections as discussed.  6. Vitamin D deficiency Continue to use Vit D as discussed.   7. Cigarette nicotine dependence without complication Smoking cessation counseling: 1. Pt acknowledges the risks of long term smoking, she will try to quite smoking. 2. Options for different medications including nicotine products, chewing gum, patch etc, Wellbutrin and Chantix is discussed 3. Goal and date of compete cessation is discussed 4. Total time spent in smoking cessation is 15 min.   General Counseling: rio rooke understanding of the findings of todays visit and agrees with plan of treatment. I have discussed any further diagnostic evaluation that  may be needed or ordered today. We also  reviewed her medications today. she has been encouraged to call the office with any questions or concerns that should arise related to todays visit.    No orders of the defined types were placed in this encounter.   No orders of the defined types were placed in this encounter.   Time spent:30 Minutes   This patient was seen by Orson Gear AGNP-C in Collaboration with Dr Lavera Guise as a part of collaborative care agreement     Kendell Bane AGNP-C Internal medicine

## 2019-12-15 ENCOUNTER — Telehealth: Payer: Self-pay

## 2019-12-15 NOTE — Telephone Encounter (Signed)
[facility]  Pharmacist called that due to pt not on opoids since jan they are unable to fill fentanyl patch as per adam canceled pres and also lmom to tell her that she need to been seen  by [facility] to Start to pain medication

## 2019-12-16 ENCOUNTER — Other Ambulatory Visit: Payer: Self-pay

## 2019-12-16 MED ORDER — IBUPROFEN 800 MG PO TABS: 800.0000 mg | ORAL_TABLET | Freq: Four times a day (QID) | ORAL | 2 refills | Status: DC

## 2019-12-27 ENCOUNTER — Ambulatory Visit: Payer: Medicare Other

## 2019-12-28 ENCOUNTER — Ambulatory Visit: Payer: Medicare Other

## 2019-12-28 ENCOUNTER — Ambulatory Visit (INDEPENDENT_AMBULATORY_CARE_PROVIDER_SITE_OTHER): Payer: Medicare Other | Admitting: Certified Nurse Midwife

## 2019-12-28 ENCOUNTER — Other Ambulatory Visit: Payer: Self-pay

## 2019-12-28 DIAGNOSIS — Z3042 Encounter for surveillance of injectable contraceptive: Secondary | ICD-10-CM

## 2019-12-28 MED ORDER — MEDROXYPROGESTERONE ACETATE 150 MG/ML IM SUSP
150.0000 mg | Freq: Once | INTRAMUSCULAR | Status: AC
  Administered 2019-12-28: 150 mg via INTRAMUSCULAR

## 2019-12-28 NOTE — Progress Notes (Signed)
Last depo inj: 10/11/19 UPT: N/A Side effects: none Next Depo- Provera injection due: 03/14/20-03/28/20 Annual exam due: 11/23/20

## 2019-12-28 NOTE — Progress Notes (Signed)
I have reviewed the record and concur with patient management and plan of care.    Diona Fanti, CNM [facility], [facility] 12/28/19 10:04 AM

## 2020-02-17 ENCOUNTER — Telehealth: Payer: Self-pay

## 2020-02-17 NOTE — Telephone Encounter (Signed)
[name] to confirm and screen for 02-21-20 ov.

## 2020-02-21 ENCOUNTER — Ambulatory Visit: Payer: Medicare Other | Admitting: Internal Medicine

## 2020-03-09 ENCOUNTER — Telehealth: Payer: Self-pay

## 2020-03-09 NOTE — Telephone Encounter (Signed)
[name] to confirm and screen for 03-13-20 ov. 

## 2020-03-13 ENCOUNTER — Ambulatory Visit (INDEPENDENT_AMBULATORY_CARE_PROVIDER_SITE_OTHER): Payer: Medicare Other | Admitting: Internal Medicine

## 2020-03-13 ENCOUNTER — Other Ambulatory Visit: Payer: Self-pay

## 2020-03-13 ENCOUNTER — Encounter: Payer: Self-pay | Admitting: Internal Medicine

## 2020-03-13 DIAGNOSIS — Z716 Tobacco abuse counseling: Secondary | ICD-10-CM

## 2020-03-13 DIAGNOSIS — F17209 Nicotine dependence, unspecified, with unspecified nicotine-induced disorders: Secondary | ICD-10-CM | POA: Diagnosis not present

## 2020-03-13 DIAGNOSIS — Z1231 Encounter for screening mammogram for malignant neoplasm of breast: Secondary | ICD-10-CM

## 2020-03-13 DIAGNOSIS — G8929 Other chronic pain: Secondary | ICD-10-CM | POA: Diagnosis not present

## 2020-03-13 DIAGNOSIS — R109 Unspecified abdominal pain: Secondary | ICD-10-CM

## 2020-03-13 DIAGNOSIS — F331 Major depressive disorder, recurrent, moderate: Secondary | ICD-10-CM | POA: Diagnosis not present

## 2020-03-13 MED ORDER — IBUPROFEN 800 MG PO TABS: 800.0000 mg | ORAL_TABLET | Freq: Four times a day (QID) | ORAL | 2 refills | Status: DC

## 2020-03-13 MED ORDER — HYDROCODONE-ACETAMINOPHEN 5-300 MG PO TABS: ORAL_TABLET | ORAL | 0 refills | Status: DC

## 2020-03-13 NOTE — Progress Notes (Signed)
[facility] [place], [place] [address]  Internal MEDICINE  Office Visit Note  Patient Name: Stacy Pineda  [id number]  [id number]  Date of Service: 03/15/2020  Chief Complaint  Patient presents with  . Follow-up    HPI Pt is here for routine follow up. She feels well, depression is under better control after change in therapy. She still gets episodes of abdominal pain with nausea ( complicated gastric bypass and reversal). She is able to gain some weight as well. Sleeps well now. She is off Fentanyl patch now. She will like to have some pain relief medications during these episodes    Current Medication: Outpatient Encounter Medications as of 03/13/2020  Medication Sig  . clotrimazole-betamethasone (LOTRISONE) cream Apply 1 application topically 2 (two) times daily.  Marland Kitchen ibuprofen (ADVIL) 800 MG tablet Take 1 tablet (800 mg total) by mouth QID.  Marland Kitchen medroxyPROGESTERone (DEPO-PROVERA) 150 MG/ML injection Inject 1 mL (150 mg total) into the muscle every 3 (three) months.  . Multiple Vitamins-Minerals (MULTIVITAMIN ADULT PO) Take 1 tablet by mouth.  . promethazine (PHENERGAN) 6.25 MG/5ML syrup TAKE  10 ML BY MOUTH EVERY 8 HOURS AS NEEDED FOR NAUSEA AND VOMITING OR  REFRACTORY  NAUSEA/VOMITING  . QUEtiapine (SEROQUEL) 25 MG tablet Take 25 mg by mouth at bedtime.  . selegiline (EMSAM) 6 MG/24HR Place 6 mg onto the skin daily.  . [DISCONTINUED] cyclobenzaprine (FLEXERIL) 5 MG tablet Take 1 tablet (5 mg total) by mouth 3 (three) times daily as needed for muscle spasms.  . [DISCONTINUED] fentaNYL (DURAGESIC) 25 MCG/HR Place 1 patch onto the skin every 3 (three) days.  . [DISCONTINUED] ibuprofen (ADVIL) 800 MG tablet Take 1 tablet (800 mg total) by mouth QID.  . [DISCONTINUED] ondansetron (ZOFRAN-ODT) 8 MG disintegrating tablet Take 1 tablet (8 mg total) by mouth every 8 (eight) hours as needed for nausea or vomiting.  . [DISCONTINUED] Vitamin D, Ergocalciferol, (DRISDOL)  1.25 MG (50000 UNIT) CAPS capsule Take 1 capsule (50,000 Units total) by mouth every 7 (seven) days.  Marland Kitchen HYDROcodone-Acetaminophen (VICODIN) 5-300 MG TABS Take one tab po bid prn for pain   No facility-administered encounter medications on file as of 03/13/2020.    Surgical History: Past Surgical History:  Procedure Laterality Date  . ABDOMINAL SURGERY    . CHOLECYSTECTOMY    . COLONOSCOPY WITH PROPOFOL N/A 09/08/2018   Procedure: COLONOSCOPY WITH PROPOFOL;  Surgeon: Virgel Manifold, MD;  Location: [facility] ENDOSCOPY;  Service: Endoscopy;  Laterality: N/A;  . DECUBITUS ULCER EXCISION    . DILATION AND CURETTAGE OF UTERUS    . ESOPHAGOGASTRODUODENOSCOPY (EGD) WITH PROPOFOL N/A 09/08/2018   Procedure: ESOPHAGOGASTRODUODENOSCOPY (EGD) WITH PROPOFOL;  Surgeon: Virgel Manifold, MD;  Location: [facility] ENDOSCOPY;  Service: Endoscopy;  Laterality: N/A;  . GASTRIC BYPASS    . GASTRIC BYPASS    . REVISION GASTRIC RESTRICTIVE PROCEDURE FOR MORBID OBESITY      Medical History: Past Medical History:  Diagnosis Date  . Anxiety   . Asthma   . Bilateral ovarian cysts   . Chronic headaches   . Depression   . Hypothyroidism   . Pancreatitis   . Pancreatitis   . PTSD (post-traumatic stress disorder)     Family History: Family History  Problem Relation Age of Onset  . Graves' disease Mother   . Hypertension Mother   . Hyperlipidemia Mother   . Anxiety disorder Father   . Prostate cancer Father   . Depression Brother   .  Anxiety disorder Brother   . Anxiety disorder Maternal Aunt   . Depression Maternal Aunt   . Anxiety disorder Paternal Aunt   . Depression Paternal Aunt   . Anxiety disorder Maternal Uncle   . Depression Maternal Uncle   . Anxiety disorder Paternal Uncle   . Depression Paternal Uncle   . Anxiety disorder Maternal Grandfather   . Depression Maternal Grandfather   . Diabetes Maternal Grandfather   . Anxiety disorder Maternal Grandmother   . Depression Maternal  Grandmother   . Breast cancer Maternal Grandmother   . Ovarian cancer Maternal Grandmother   . Diabetes Maternal Grandmother   . Colon cancer Neg Hx     Social History   Socioeconomic History  . Marital status: Single    Spouse name: Not on file  . Number of children: Not on file  . Years of education: Not on file  . Highest education level: Not on file  Occupational History  . Not on file  Tobacco Use  . Smoking status: Current Some Day Smoker    Packs/day: 0.25    Types: Cigarettes    Start date: 03/26/2005  . Smokeless tobacco: Never Used  Vaping Use  . Vaping Use: Never used  Substance and Sexual Activity  . Alcohol use: No    Alcohol/week: 0.0 standard drinks  . Drug use: Yes    Frequency: 3.0 times per week    Types: Marijuana    Comment: MJ use weekly  . Sexual activity: Not Currently    Birth control/protection: Injection  Other Topics Concern  . Not on file  Social History Narrative  . Not on file   Social Determinants of Health   Financial Resource Strain:   . Difficulty of Paying Living Expenses:   Food Insecurity:   . Worried About Charity fundraiser in the Last Year:   . Arboriculturist in the Last Year:   Transportation Needs:   . Film/video editor (Medical):   Marland Kitchen Lack of Transportation (Non-Medical):   Physical Activity:   . Days of Exercise per Week:   . Minutes of Exercise per Session:   Stress:   . Feeling of Stress :   Social Connections:   . Frequency of Communication with Friends and Family:   . Frequency of Social Gatherings with Friends and Family:   . Attends Religious Services:   . Active Member of Clubs or Organizations:   . Attends Archivist Meetings:   Marland Kitchen Marital Status:   Intimate Partner Violence:   . Fear of Current or Ex-Partner:   . Emotionally Abused:   Marland Kitchen Physically Abused:   . Sexually Abused:       Review of Systems  Constitutional: Negative for chills, diaphoresis and fatigue.  HENT: Negative  for ear pain, postnasal drip and sinus pressure.   Eyes: Negative for photophobia, discharge, redness, itching and visual disturbance.  Respiratory: Negative for cough, shortness of breath and wheezing.   Cardiovascular: Negative for chest pain, palpitations and leg swelling.  Gastrointestinal: Negative for abdominal pain, constipation, diarrhea, nausea and vomiting.  Genitourinary: Negative for dysuria and flank pain.  Musculoskeletal: Negative for arthralgias, back pain, gait problem and neck pain.  Skin: Negative for color change.  Allergic/Immunologic: Negative for environmental allergies and food allergies.  Neurological: Negative for dizziness and headaches.  Hematological: Does not bruise/bleed easily.  Psychiatric/Behavioral: Negative for agitation, behavioral problems (depression) and hallucinations.    Vital Signs: BP (!) 150/72  Pulse 89   Temp (!) 97.4 F (36.3 C)   Resp 16   Ht 6\' 1"  (1.854 m)   Wt 164 lb 6.4 oz (74.6 kg)   SpO2 99%   BMI 21.69 kg/m    Physical Exam Constitutional:      General: She is not in acute distress.    Appearance: She is well-developed. She is not diaphoretic.  HENT:     Head: Normocephalic and atraumatic.     Mouth/Throat:     Pharynx: No oropharyngeal exudate.  Eyes:     Pupils: Pupils are equal, round, and reactive to light.  Neck:     Thyroid: No thyromegaly.     Vascular: No JVD.     Trachea: No tracheal deviation.  Cardiovascular:     Rate and Rhythm: Normal rate and regular rhythm.     Heart sounds: Normal heart sounds. No murmur heard.  No friction rub. No gallop.   Pulmonary:     Effort: Pulmonary effort is normal. No respiratory distress.     Breath sounds: No wheezing or rales.  Chest:     Chest wall: No tenderness.  Abdominal:     General: Bowel sounds are normal.     Palpations: Abdomen is soft.  Musculoskeletal:        General: Normal range of motion.     Cervical back: Normal range of motion and neck supple.   Lymphadenopathy:     Cervical: No cervical adenopathy.  Skin:    General: Skin is warm and dry.  Neurological:     Mental Status: She is alert and oriented to person, place, and time.     Cranial Nerves: No cranial nerve deficit.  Psychiatric:        Behavior: Behavior normal.        Thought Content: Thought content normal.        Judgment: Judgment normal.    Assessment/Plan: 1. Chronic abdominal pain - Better control now, needs prn meds  - HYDROcodone-Acetaminophen (VICODIN) 5-300 MG TABS; Take one tab po bid prn for pain  Dispense: 15 tablet; Refill: 0 - ibuprofen (ADVIL) 800 MG tablet; Take 1 tablet (800 mg total) by mouth QID.  Dispense: 120 tablet; Refill: 2  2. Visit for screening mammogram - MM DIGITAL SCREENING BILATERAL; Future  3. MDD (major depressive disorder), recurrent episode, moderate ([place]) - Per psych, stable   4. Tobacco use disorder, continuous - Pt continues to smoke, will consider smoking less number   5. Tobacco abuse counseling  General Counseling: Stacy Pineda verbalizes understanding of the findings of todays visit and agrees with plan of treatment. I have discussed any further diagnostic evaluation that may be needed or ordered today. We also reviewed her medications today. she has been encouraged to call the office with any questions or concerns that should arise related to todays visit. Smoking cessation counseling: 1. Pt acknowledges the risks of long term smoking, she will try to quite smoking. 2. Options for different medications including nicotine products, chewing gum, patch etc, Wellbutrin and Chantix is discussed 3. Goal and date of compete cessation is discussed 4. Total time spent in smoking cessation is 10 min.  Orders Placed This Encounter  Procedures  . MM DIGITAL SCREENING BILATERAL    Meds ordered this encounter  Medications  . HYDROcodone-Acetaminophen (VICODIN) 5-300 MG TABS    Sig: Take one tab po bid prn for pain    Dispense:  15  tablet    Refill:  0  .  ibuprofen (ADVIL) 800 MG tablet    Sig: Take 1 tablet (800 mg total) by mouth QID.    Dispense:  120 tablet    Refill:  2    Total time spent: 35Minutes Time spent includes review of chart, medications, test results, and follow up plan with the patient.   Dr Lavera Guise Internal medicine

## 2020-03-28 ENCOUNTER — Ambulatory Visit: Payer: Medicare Other

## 2020-03-29 ENCOUNTER — Ambulatory Visit (INDEPENDENT_AMBULATORY_CARE_PROVIDER_SITE_OTHER): Payer: Medicare Other | Admitting: Certified Nurse Midwife

## 2020-03-29 ENCOUNTER — Ambulatory Visit: Payer: Medicare Other

## 2020-03-29 ENCOUNTER — Other Ambulatory Visit: Payer: Self-pay

## 2020-03-29 DIAGNOSIS — Z3042 Encounter for surveillance of injectable contraceptive: Secondary | ICD-10-CM | POA: Diagnosis not present

## 2020-03-29 MED ORDER — MEDROXYPROGESTERONE ACETATE 150 MG/ML IM SUSP
150.0000 mg | Freq: Once | INTRAMUSCULAR | Status: AC
  Administered 2020-03-29: 150 mg via INTRAMUSCULAR

## 2020-03-29 NOTE — Progress Notes (Signed)
Last depo inj: 12/28/19 UPT: N/A Side effects: none Next Depo- Provera injection due: 06/14/20-06/28/20 Annual exam due: 11/23/20

## 2020-03-31 DIAGNOSIS — F4312 Post-traumatic stress disorder, chronic: Secondary | ICD-10-CM | POA: Diagnosis not present

## 2020-03-31 DIAGNOSIS — F410 Panic disorder [episodic paroxysmal anxiety] without agoraphobia: Secondary | ICD-10-CM | POA: Diagnosis not present

## 2020-03-31 DIAGNOSIS — F332 Major depressive disorder, recurrent severe without psychotic features: Secondary | ICD-10-CM | POA: Diagnosis not present

## 2020-04-02 NOTE — Progress Notes (Signed)
I have reviewed the record and concur with patient management and plan of care.   Dani Gobble, CNM [facility], [facility] 04/02/20 1:52 PM

## 2020-04-03 ENCOUNTER — Ambulatory Visit
Admission: RE | Admit: 2020-04-03 | Discharge: 2020-04-03 | Disposition: A | Discharge status: Home / Self Care | Payer: Medicare Other | Source: Ambulatory Visit | Attending: Internal Medicine | Admitting: Internal Medicine

## 2020-04-03 ENCOUNTER — Other Ambulatory Visit: Payer: Self-pay

## 2020-04-03 DIAGNOSIS — Z1231 Encounter for screening mammogram for malignant neoplasm of breast: Secondary | ICD-10-CM | POA: Diagnosis not present

## 2020-04-06 ENCOUNTER — Other Ambulatory Visit: Payer: Self-pay | Admitting: Internal Medicine

## 2020-04-06 DIAGNOSIS — N6459 Other signs and symptoms in breast: Secondary | ICD-10-CM

## 2020-04-06 DIAGNOSIS — R921 Mammographic calcification found on diagnostic imaging of breast: Secondary | ICD-10-CM

## 2020-04-10 ENCOUNTER — Other Ambulatory Visit: Payer: Self-pay | Admitting: Internal Medicine

## 2020-04-10 DIAGNOSIS — R928 Other abnormal and inconclusive findings on diagnostic imaging of breast: Secondary | ICD-10-CM

## 2020-04-10 DIAGNOSIS — R921 Mammographic calcification found on diagnostic imaging of breast: Secondary | ICD-10-CM

## 2020-04-13 ENCOUNTER — Ambulatory Visit
Admission: RE | Admit: 2020-04-13 | Discharge: 2020-04-13 | Disposition: A | Discharge status: Home / Self Care | Payer: Medicare Other | Source: Ambulatory Visit | Attending: Internal Medicine | Admitting: Internal Medicine

## 2020-04-13 ENCOUNTER — Other Ambulatory Visit: Payer: Self-pay

## 2020-04-13 DIAGNOSIS — R928 Other abnormal and inconclusive findings on diagnostic imaging of breast: Secondary | ICD-10-CM | POA: Insufficient documentation

## 2020-04-13 DIAGNOSIS — R921 Mammographic calcification found on diagnostic imaging of breast: Secondary | ICD-10-CM | POA: Insufficient documentation

## 2020-04-15 NOTE — Progress Notes (Signed)
Breast biopsy order

## 2020-04-17 ENCOUNTER — Encounter: Payer: Self-pay | Admitting: Internal Medicine

## 2020-04-17 ENCOUNTER — Ambulatory Visit (INDEPENDENT_AMBULATORY_CARE_PROVIDER_SITE_OTHER): Payer: Medicare Other | Admitting: Internal Medicine

## 2020-04-17 VITALS — Temp 99.3°F | Resp 16 | Ht 72.0 in | Wt 155.0 lb

## 2020-04-17 DIAGNOSIS — F17209 Nicotine dependence, unspecified, with unspecified nicotine-induced disorders: Secondary | ICD-10-CM

## 2020-04-17 DIAGNOSIS — G8929 Other chronic pain: Secondary | ICD-10-CM | POA: Diagnosis not present

## 2020-04-17 DIAGNOSIS — E538 Deficiency of other specified B group vitamins: Secondary | ICD-10-CM

## 2020-04-17 DIAGNOSIS — R109 Unspecified abdominal pain: Secondary | ICD-10-CM

## 2020-04-17 DIAGNOSIS — Z20822 Contact with and (suspected) exposure to covid-19: Secondary | ICD-10-CM | POA: Diagnosis not present

## 2020-04-17 DIAGNOSIS — R509 Fever, unspecified: Secondary | ICD-10-CM

## 2020-04-17 MED ORDER — OXYCODONE-ACETAMINOPHEN 10-325 MG PO TABS: 1.0000 | ORAL_TABLET | Freq: Three times a day (TID) | ORAL | 0 refills | Status: AC | PRN

## 2020-04-17 NOTE — Progress Notes (Signed)
[facility] [place], [place] [address]  Internal MEDICINE  Telephone Visit  Patient Name: Stacy Pineda  [id number]  [id number]  Date of Service: 04/19/2020  I connected with the patient at 9:15 by telephone and verified the patients identity using two identifiers.   I discussed the limitations, risks, security and privacy concerns of performing an evaluation and management service by telephone and the availability of in person appointments. I also discussed with the patient that there may be a patient responsible charge related to the service.  The patient expressed understanding and agrees to proceed.    Chief Complaint  Patient presents with  . Telephone Screen  . Telephone Assessment  . Fever    99.3 this morning (fully vaccinated)  . Pain  . Follow-up    6 week fup chronic pain  . Nicotine Dependence    would like to talk about stopping smoking  . Quality Metric Gaps    thinks she had flu vacc in feb 2021, Hep C    HPI Pt is connected via video visit, c/o low grade fever, there might be some exposure to covid 19, she is staying at home. Has been vaccinated. Depression is under better control. Pt is off of Duragesic patch now and takes prin pain meds, she does not want to take hydrocodone due to itching but will like to try something else. Her B12 and Ferritin both are low, pending mammogram   Pt has a very complicated course since 2012 after her gastric bypass. She started having abdominal pain with nausea and vomiting, had Cholecystectomy in 2013 and then multiple episodes of pancreatitis. She underwent MRCP/ERCP. Had sphincterotomy done for stenosis of sphincter of oddi. However her symptoms continued. She does require use of pancreatic enzymes.in the past she needed TPN  There is a mention of narcotic bowel syndrome and may be a functional problem. She had a revision of her gastric bypass in 2015 with no resolution in her symptoms She also had gastric  ulcer, there is no h/o alcohol intake/abuse.She continues to have nutritional deficiencies and will need iron infusions periodically   Current Medication: Outpatient Encounter Medications as of 04/17/2020  Medication Sig  . clotrimazole-betamethasone (LOTRISONE) cream Apply 1 application topically 2 (two) times daily.  Marland Kitchen ibuprofen (ADVIL) 800 MG tablet Take 1 tablet (800 mg total) by mouth QID.  Marland Kitchen medroxyPROGESTERone (DEPO-PROVERA) 150 MG/ML injection Inject 1 mL (150 mg total) into the muscle every 3 (three) months.  . Multiple Vitamins-Minerals (MULTIVITAMIN ADULT PO) Take 1 tablet by mouth.  . promethazine (PHENERGAN) 6.25 MG/5ML syrup TAKE  10 ML BY MOUTH EVERY 8 HOURS AS NEEDED FOR NAUSEA AND VOMITING OR  REFRACTORY  NAUSEA/VOMITING  . QUEtiapine (SEROQUEL) 25 MG tablet Take 25 mg by mouth at bedtime.  . selegiline (EMSAM) 6 MG/24HR Place 6 mg onto the skin daily.  Marland Kitchen oxyCODONE-acetaminophen (PERCOCET) 10-325 MG tablet Take 1 tablet by mouth every 8 (eight) hours as needed for up to 5 days for pain.  . [DISCONTINUED] HYDROcodone-Acetaminophen (VICODIN) 5-300 MG TABS Take one tab po bid prn for pain (Patient not taking: Reported on 04/17/2020)   No facility-administered encounter medications on file as of 04/17/2020.    Surgical History: Past Surgical History:  Procedure Laterality Date  . ABDOMINAL SURGERY    . CHOLECYSTECTOMY    . COLONOSCOPY WITH PROPOFOL N/A 09/08/2018   Procedure: COLONOSCOPY WITH PROPOFOL;  Surgeon: Virgel Manifold, MD;  Location: [facility] ENDOSCOPY;  Service: Endoscopy;  Laterality: N/A;  . DECUBITUS ULCER EXCISION    . DILATION AND CURETTAGE OF UTERUS    . ESOPHAGOGASTRODUODENOSCOPY (EGD) WITH PROPOFOL N/A 09/08/2018   Procedure: ESOPHAGOGASTRODUODENOSCOPY (EGD) WITH PROPOFOL;  Surgeon: Virgel Manifold, MD;  Location: [facility] ENDOSCOPY;  Service: Endoscopy;  Laterality: N/A;  . GASTRIC BYPASS    . GASTRIC BYPASS    . REVISION GASTRIC RESTRICTIVE PROCEDURE FOR  MORBID OBESITY      Medical History: Past Medical History:  Diagnosis Date  . Anxiety   . Asthma   . Bilateral ovarian cysts   . Chronic headaches   . Depression   . Hypothyroidism   . Pancreatitis   . Pancreatitis   . PTSD (post-traumatic stress disorder)     Family History: Family History  Problem Relation Age of Onset  . Graves' disease Mother   . Hypertension Mother   . Hyperlipidemia Mother   . Anxiety disorder Father   . Prostate cancer Father   . Depression Brother   . Anxiety disorder Brother   . Anxiety disorder Maternal Aunt   . Depression Maternal Aunt   . Anxiety disorder Paternal Aunt   . Depression Paternal Aunt   . Breast cancer Paternal Aunt   . Anxiety disorder Maternal Uncle   . Depression Maternal Uncle   . Anxiety disorder Paternal Uncle   . Depression Paternal Uncle   . Anxiety disorder Maternal Grandfather   . Depression Maternal Grandfather   . Diabetes Maternal Grandfather   . Anxiety disorder Maternal Grandmother   . Depression Maternal Grandmother   . Breast cancer Maternal Grandmother   . Ovarian cancer Maternal Grandmother   . Diabetes Maternal Grandmother   . Colon cancer Neg Hx     Social History   Socioeconomic History  . Marital status: Single    Spouse name: Not on file  . Number of children: Not on file  . Years of education: Not on file  . Highest education level: Not on file  Occupational History  . Not on file  Tobacco Use  . Smoking status: Current Some Day Smoker    Packs/day: 0.25    Types: Cigarettes    Start date: 03/26/2005  . Smokeless tobacco: Never Used  . Tobacco comment: working on stopping  Vaping Use  . Vaping Use: Never used  Substance and Sexual Activity  . Alcohol use: No    Alcohol/week: 0.0 standard drinks  . Drug use: Yes    Frequency: 3.0 times per week    Types: Marijuana    Comment: MJ use weekly  . Sexual activity: Not Currently    Birth control/protection: Injection  Other Topics  Concern  . Not on file  Social History Narrative  . Not on file   Social Determinants of Health   Financial Resource Strain:   . Difficulty of Paying Living Expenses: Not on file  Food Insecurity:   . Worried About Charity fundraiser in the Last Year: Not on file  . Ran Out of Food in the Last Year: Not on file  Transportation Needs:   . Lack of Transportation (Medical): Not on file  . Lack of Transportation (Non-Medical): Not on file  Physical Activity:   . Days of Exercise per Week: Not on file  . Minutes of Exercise per Session: Not on file  Stress:   . Feeling of Stress : Not on file  Social Connections:   . Frequency of Communication with Friends and Family: Not on file  .  Frequency of Social Gatherings with Friends and Family: Not on file  . Attends Religious Services: Not on file  . Active Member of Clubs or Organizations: Not on file  . Attends Archivist Meetings: Not on file  . Marital Status: Not on file  Intimate Partner Violence:   . Fear of Current or Ex-Partner: Not on file  . Emotionally Abused: Not on file  . Physically Abused: Not on file  . Sexually Abused: Not on file    Review of Systems  Constitutional: Positive for fever. Negative for chills, fatigue and unexpected weight change.  HENT: Negative for congestion, postnasal drip, rhinorrhea, sneezing and sore throat.   Eyes: Negative for redness.  Respiratory: Negative for cough, chest tightness and shortness of breath.   Gastrointestinal: Negative for abdominal pain.  Genitourinary: Negative for dysuria and frequency.  Musculoskeletal: Negative for arthralgias.  Skin: Negative for rash.  Neurological: Negative.  Negative for tremors and numbness.  Hematological: Negative for adenopathy. Does not bruise/bleed easily.  Psychiatric/Behavioral: Negative for behavioral problems (Depression), sleep disturbance and suicidal ideas. The patient is not nervous/anxious.     Vital Signs: Temp 99.3  F (37.4 C)   Resp 16   Ht 6' (1.829 m)   Wt 155 lb (70.3 kg)   BMI 21.02 kg/m    Observation/Objective: NAD, Able to communicate well, does not think she has fever at the moment    Assessment/Plan: 1. Fever with exposure to COVID-19 virus  Feeling better this am, pt will look into getting her booster dose. Stay quarantined for now.   2. Chronic abdominal pain Pt to discontinue hydrocodone at home due to itching  - oxyCODONE-acetaminophen (PERCOCET) 10-325 MG tablet; Take 1 tablet by mouth every 8 (eight) hours as needed for up to 5 days for pain.  Dispense: 15 tablet; Refill: 0  3. Tobacco use disorder, continuous counseling provided   4. B12 deficiency Pt needs to be on B12 inj // Ferritin has been low as well, might need iron infusion   General Counseling: Stacy Pineda verbalizes understanding of the findings of today's phone visit and agrees with plan of treatment. I have discussed any further diagnostic evaluation that may be needed or ordered today. We also reviewed her medications today. she has been encouraged to call the office with any questions or concerns that should arise related to todays visit.    Meds ordered this encounter  Medications  . oxyCODONE-acetaminophen (PERCOCET) 10-325 MG tablet    Sig: Take 1 tablet by mouth every 8 (eight) hours as needed for up to 5 days for pain.    Dispense:  15 tablet    Refill:  0    Time spent:20 Minutes    Dr Lavera Guise Internal medicine

## 2020-04-18 ENCOUNTER — Other Ambulatory Visit: Payer: Self-pay | Admitting: Internal Medicine

## 2020-04-18 DIAGNOSIS — R928 Other abnormal and inconclusive findings on diagnostic imaging of breast: Secondary | ICD-10-CM

## 2020-04-18 DIAGNOSIS — R921 Mammographic calcification found on diagnostic imaging of breast: Secondary | ICD-10-CM

## 2020-04-23 ENCOUNTER — Ambulatory Visit: Payer: Medicare Other | Admitting: Internal Medicine

## 2020-04-27 ENCOUNTER — Ambulatory Visit
Admission: RE | Admit: 2020-04-27 | Discharge: 2020-04-27 | Disposition: A | Discharge status: Home / Self Care | Payer: Medicare Other | Source: Ambulatory Visit | Attending: Internal Medicine | Admitting: Internal Medicine

## 2020-04-27 ENCOUNTER — Other Ambulatory Visit: Payer: Self-pay

## 2020-04-27 DIAGNOSIS — R928 Other abnormal and inconclusive findings on diagnostic imaging of breast: Secondary | ICD-10-CM | POA: Insufficient documentation

## 2020-04-27 DIAGNOSIS — R921 Mammographic calcification found on diagnostic imaging of breast: Secondary | ICD-10-CM

## 2020-04-27 DIAGNOSIS — N6081 Other benign mammary dysplasias of right breast: Secondary | ICD-10-CM | POA: Diagnosis not present

## 2020-04-27 HISTORY — PX: BREAST BIOPSY: SHX20

## 2020-04-30 LAB — SURGICAL PATHOLOGY

## 2020-05-25 ENCOUNTER — Other Ambulatory Visit: Payer: Self-pay | Admitting: Internal Medicine

## 2020-05-25 ENCOUNTER — Other Ambulatory Visit: Payer: Self-pay

## 2020-05-25 ENCOUNTER — Telehealth: Payer: Self-pay

## 2020-05-25 DIAGNOSIS — R112 Nausea with vomiting, unspecified: Secondary | ICD-10-CM

## 2020-05-25 MED ORDER — PROMETHAZINE HCL 6.25 MG/5ML PO SYRP: ORAL_SOLUTION | ORAL | 2 refills | Status: DC

## 2020-05-25 NOTE — Telephone Encounter (Signed)
Sent her promethazine, but Whic pain meds, oxy or vicodine, can u chceck with her, I will tell her probably will not be sent today but on Monday

## 2020-05-28 ENCOUNTER — Other Ambulatory Visit: Payer: Self-pay | Admitting: Internal Medicine

## 2020-05-28 ENCOUNTER — Other Ambulatory Visit: Payer: Self-pay

## 2020-05-28 DIAGNOSIS — R112 Nausea with vomiting, unspecified: Secondary | ICD-10-CM

## 2020-05-28 MED ORDER — OXYCODONE-ACETAMINOPHEN 10-325 MG PO TABS
ORAL_TABLET | ORAL | 0 refills | Status: DC
Start: 2020-05-28 — End: 2020-10-22

## 2020-05-28 MED ORDER — PROMETHAZINE HCL 6.25 MG/5ML PO SYRP: ORAL_SOLUTION | ORAL | 2 refills | Status: DC

## 2020-05-28 NOTE — Telephone Encounter (Signed)
Spoke with need oxycodone

## 2020-05-28 NOTE — Telephone Encounter (Signed)
Pt.notified

## 2020-05-28 NOTE — Telephone Encounter (Signed)
done

## 2020-05-28 NOTE — Telephone Encounter (Signed)
[name] pt to call us back

## 2020-06-19 ENCOUNTER — Ambulatory Visit: Payer: Medicare Other | Admitting: Internal Medicine

## 2020-06-19 ENCOUNTER — Telehealth: Payer: Self-pay

## 2020-06-19 NOTE — Telephone Encounter (Signed)
[name] for patient to call and rs missed appt on 06-19-20. Stacy Pineda

## 2020-06-22 ENCOUNTER — Ambulatory Visit (INDEPENDENT_AMBULATORY_CARE_PROVIDER_SITE_OTHER): Payer: Medicare Other

## 2020-06-22 ENCOUNTER — Telehealth: Payer: Self-pay

## 2020-06-22 ENCOUNTER — Other Ambulatory Visit: Payer: Self-pay

## 2020-06-22 DIAGNOSIS — Z3042 Encounter for surveillance of injectable contraceptive: Secondary | ICD-10-CM

## 2020-06-22 MED ORDER — MEDROXYPROGESTERONE ACETATE 150 MG/ML IM SUSP
150.0000 mg | Freq: Once | INTRAMUSCULAR | Status: AC
  Administered 2020-06-22: 150 mg via INTRAMUSCULAR

## 2020-06-22 NOTE — Telephone Encounter (Signed)
mychart message sent to patient

## 2020-06-22 NOTE — Progress Notes (Signed)
Last depo inj:03/29/20 UPT:N/A Side effects:none Next Depo- Provera injection due: 09/07/20-09/21/20 Annual exam due:11/23/20

## 2020-06-30 DIAGNOSIS — F4312 Post-traumatic stress disorder, chronic: Secondary | ICD-10-CM | POA: Diagnosis not present

## 2020-06-30 DIAGNOSIS — F332 Major depressive disorder, recurrent severe without psychotic features: Secondary | ICD-10-CM | POA: Diagnosis not present

## 2020-06-30 DIAGNOSIS — F410 Panic disorder [episodic paroxysmal anxiety] without agoraphobia: Secondary | ICD-10-CM | POA: Diagnosis not present

## 2020-09-06 DIAGNOSIS — Z20822 Contact with and (suspected) exposure to covid-19: Secondary | ICD-10-CM | POA: Diagnosis not present

## 2020-09-10 ENCOUNTER — Encounter: Payer: Self-pay | Admitting: Hospice and Palliative Medicine

## 2020-09-10 ENCOUNTER — Other Ambulatory Visit: Payer: Self-pay

## 2020-09-10 ENCOUNTER — Ambulatory Visit (INDEPENDENT_AMBULATORY_CARE_PROVIDER_SITE_OTHER): Payer: BC Managed Care – PPO | Admitting: Internal Medicine

## 2020-09-10 VITALS — Temp 99.5°F | Resp 16 | Ht 72.0 in | Wt 160.0 lb

## 2020-09-10 DIAGNOSIS — G44229 Chronic tension-type headache, not intractable: Secondary | ICD-10-CM

## 2020-09-10 DIAGNOSIS — J45909 Unspecified asthma, uncomplicated: Secondary | ICD-10-CM | POA: Diagnosis not present

## 2020-09-10 MED ORDER — PREDNISONE 10 MG PO TABS: ORAL_TABLET | ORAL | 0 refills | Status: DC

## 2020-09-10 MED ORDER — IBUPROFEN 600 MG PO TABS: ORAL_TABLET | ORAL | 0 refills | Status: DC

## 2020-09-10 MED ORDER — AZITHROMYCIN 250 MG PO TABS: ORAL_TABLET | ORAL | 0 refills | Status: DC

## 2020-09-10 MED ORDER — FLUCONAZOLE 150 MG PO TABS: ORAL_TABLET | ORAL | 0 refills | Status: DC

## 2020-09-10 NOTE — Telephone Encounter (Signed)
Pt called that she got yeast infection due to she is on antibiotic send diflucan to [facility]

## 2020-09-10 NOTE — Progress Notes (Signed)
[facility] [place], [address] [address]  Internal MEDICINE  Telephone Visit  Patient Name: Stacy Pineda  [id number]  [id number]  Date of Service: 09/18/2020  I connected with the patient at 1150 am  by telephone and verified the patients identity using two identifiers.   I discussed the limitations, risks, security and privacy concerns of performing an evaluation and management service by telephone and the availability of in person appointments. I also discussed with the patient that there may be a patient responsible charge related to the service.  The patient expressed understanding and agrees to proceed.    Chief Complaint  Patient presents with  . Acute Visit    Congested in head, headache, 99.5 fever, coughing, wheezing   . Telephone Assessment    Phone call preferred   . Telephone Screen    [phone]    HPI Pt is connected via televisit for acute and sick visit. C/o cough, wheezing and fever. ?? COVID exposure Continues to have headaches, but improved.   Current Medication: Outpatient Encounter Medications as of 09/10/2020  Medication Sig  . ALPRAZolam (XANAX) 0.5 MG tablet Take 0.5 mg by mouth at bedtime as needed for anxiety.  Marland Kitchen azithromycin (ZITHROMAX) 250 MG tablet Use as directed  . ibuprofen (ADVIL) 600 MG tablet Take one tab po bid for headaches prn only  . medroxyPROGESTERone (DEPO-PROVERA) 150 MG/ML injection Inject 1 mL (150 mg total) into the muscle every 3 (three) months.  Marland Kitchen oxyCODONE-acetaminophen (PERCOCET) 10-325 MG tablet Take half to one rab po qd prn for acute and severe pain  . predniSONE (DELTASONE) 10 MG tablet Take one tab 3 x day for 3 days, then take one tab 2 x a day for 3 days and then take one tab a day for 3 days for copd  . promethazine (PHENERGAN) 6.25 MG/5ML syrup TAKE  10 ML BY MOUTH EVERY 8 HOURS AS NEEDED FOR NAUSEA AND VOMITING OR  REFRACTORY  NAUSEA/VOMITING  . QUEtiapine (SEROQUEL) 25 MG tablet Take 25 mg by  mouth at bedtime.  . selegiline (EMSAM) 6 MG/24HR Place 6 mg onto the skin daily.  . Multiple Vitamins-Minerals (MULTIVITAMIN ADULT PO) Take 1 tablet by mouth.  . [DISCONTINUED] clotrimazole-betamethasone (LOTRISONE) cream Apply 1 application topically 2 (two) times daily. (Patient not taking: Reported on 09/10/2020)  . [DISCONTINUED] ibuprofen (ADVIL) 800 MG tablet Take 1 tablet (800 mg total) by mouth QID. (Patient not taking: Reported on 09/10/2020)   No facility-administered encounter medications on file as of 09/10/2020.    Surgical History: Past Surgical History:  Procedure Laterality Date  . ABDOMINAL SURGERY    . BREAST BIOPSY Right 04/27/2020   stereo bx, coil clip, path pending   . CHOLECYSTECTOMY    . COLONOSCOPY WITH PROPOFOL N/A 09/08/2018   Procedure: COLONOSCOPY WITH PROPOFOL;  Surgeon: Virgel Manifold, MD;  Location: [facility] ENDOSCOPY;  Service: Endoscopy;  Laterality: N/A;  . DECUBITUS ULCER EXCISION    . DILATION AND CURETTAGE OF UTERUS    . ESOPHAGOGASTRODUODENOSCOPY (EGD) WITH PROPOFOL N/A 09/08/2018   Procedure: ESOPHAGOGASTRODUODENOSCOPY (EGD) WITH PROPOFOL;  Surgeon: Virgel Manifold, MD;  Location: [facility] ENDOSCOPY;  Service: Endoscopy;  Laterality: N/A;  . GASTRIC BYPASS    . GASTRIC BYPASS    . REVISION GASTRIC RESTRICTIVE PROCEDURE FOR MORBID OBESITY      Medical History: Past Medical History:  Diagnosis Date  . Anxiety   . Asthma   . Bilateral ovarian cysts   . Chronic headaches   .  Depression   . Hypothyroidism   . Pancreatitis   . Pancreatitis   . PTSD (post-traumatic stress disorder)     Family History: Family History  Problem Relation Age of Onset  . Graves' disease Mother   . Hypertension Mother   . Hyperlipidemia Mother   . Anxiety disorder Father   . Prostate cancer Father   . Depression Brother   . Anxiety disorder Brother   . Anxiety disorder Maternal Aunt   . Depression Maternal Aunt   . Anxiety disorder Paternal Aunt   .  Depression Paternal Aunt   . Breast cancer Paternal Aunt   . Anxiety disorder Maternal Uncle   . Depression Maternal Uncle   . Anxiety disorder Paternal Uncle   . Depression Paternal Uncle   . Anxiety disorder Maternal Grandfather   . Depression Maternal Grandfather   . Diabetes Maternal Grandfather   . Anxiety disorder Maternal Grandmother   . Depression Maternal Grandmother   . Breast cancer Maternal Grandmother   . Ovarian cancer Maternal Grandmother   . Diabetes Maternal Grandmother   . Colon cancer Neg Hx     Social History   Socioeconomic History  . Marital status: Single    Spouse name: Not on file  . Number of children: Not on file  . Years of education: Not on file  . Highest education level: Not on file  Occupational History  . Not on file  Tobacco Use  . Smoking status: Current Some Day Smoker    Packs/day: 0.25    Types: Cigarettes    Start date: 03/26/2005  . Smokeless tobacco: Never Used  . Tobacco comment: working on stopping  Vaping Use  . Vaping Use: Never used  Substance and Sexual Activity  . Alcohol use: No    Alcohol/week: 0.0 standard drinks  . Drug use: Yes    Frequency: 3.0 times per week    Types: Marijuana    Comment: MJ use weekly  . Sexual activity: Not Currently    Birth control/protection: Injection  Other Topics Concern  . Not on file  Social History Narrative  . Not on file   Social Determinants of Health   Financial Resource Strain: Not on file  Food Insecurity: Not on file  Transportation Needs: Not on file  Physical Activity: Not on file  Stress: Not on file  Social Connections: Not on file  Intimate Partner Violence: Not on file      Review of Systems  Constitutional: Positive for fever. Negative for fatigue.  HENT: Positive for sinus pain. Negative for congestion, mouth sores and postnasal drip.   Respiratory: Negative for cough.   Cardiovascular: Negative for chest pain.  Genitourinary: Negative for flank pain.   Neurological: Positive for headaches.  Psychiatric/Behavioral: Negative.     Vital Signs: Temp 99.5 F (37.5 C)   Resp 16   Ht 6' (1.829 m)   Wt 160 lb (72.6 kg)   BMI 21.70 kg/m    Observation/Objective: Pt is congested and hoarse     Assessment/Plan:  1. Acute asthmatic bronchitis Start therapy, will increase po fluids, advised for COVID test - azithromycin (ZITHROMAX) 250 MG tablet; Use as directed  Dispense: 6 tablet; Refill: 0 - predniSONE (DELTASONE) 10 MG tablet; Take one tab 3 x day for 3 days, then take one tab 2 x a day for 3 days and then take one tab a day for 3 days for copd  Dispense: 18 tablet; Refill: 0  2. Chronic tension-type  headache, not intractable Will like refill for advil since it helps with headaches  - ibuprofen (ADVIL) 600 MG tablet; Take one tab po bid for headaches prn only  Dispense: 30 tablet; Refill: 0 General Counseling: Jazmon verbalizes understanding of the findings of today's phone visit and agrees with plan of treatment. I have discussed any further diagnostic evaluation that may be needed or ordered today. We also reviewed her medications today. she has been encouraged to call the office with any questions or concerns that should arise related to todays visit.  No orders of the defined types were placed in this encounter.   Meds ordered this encounter  Medications  . azithromycin (ZITHROMAX) 250 MG tablet    Sig: Use as directed    Dispense:  6 tablet    Refill:  0  . predniSONE (DELTASONE) 10 MG tablet    Sig: Take one tab 3 x day for 3 days, then take one tab 2 x a day for 3 days and then take one tab a day for 3 days for copd    Dispense:  18 tablet    Refill:  0  . ibuprofen (ADVIL) 600 MG tablet    Sig: Take one tab po bid for headaches prn only    Dispense:  30 tablet    Refill:  0    Time spent:20 Minutes    Dr Lavera Guise Internal medicine

## 2020-09-11 ENCOUNTER — Ambulatory Visit: Payer: Medicare Other

## 2020-09-12 ENCOUNTER — Encounter: Payer: Self-pay | Admitting: Hospice and Palliative Medicine

## 2020-09-12 ENCOUNTER — Telehealth: Payer: Self-pay

## 2020-09-12 NOTE — Telephone Encounter (Signed)
Scanned in patient work excuse. Stacy Pineda

## 2020-09-13 ENCOUNTER — Ambulatory Visit: Payer: Medicare Other

## 2020-09-14 ENCOUNTER — Ambulatory Visit: Payer: Medicare Other

## 2020-09-18 ENCOUNTER — Ambulatory Visit: Payer: Medicare Other | Admitting: Internal Medicine

## 2020-09-20 ENCOUNTER — Other Ambulatory Visit: Payer: Self-pay

## 2020-09-20 ENCOUNTER — Ambulatory Visit (INDEPENDENT_AMBULATORY_CARE_PROVIDER_SITE_OTHER): Payer: Medicare Other | Admitting: Certified Nurse Midwife

## 2020-09-20 DIAGNOSIS — Z3042 Encounter for surveillance of injectable contraceptive: Secondary | ICD-10-CM | POA: Diagnosis not present

## 2020-09-20 DIAGNOSIS — R21 Rash and other nonspecific skin eruption: Secondary | ICD-10-CM

## 2020-09-20 MED ORDER — CLOTRIMAZOLE-BETAMETHASONE 1-0.05 % EX CREA: 1.0000 "application " | TOPICAL_CREAM | Freq: Two times a day (BID) | CUTANEOUS | 0 refills | Status: DC

## 2020-09-20 MED ORDER — MEDROXYPROGESTERONE ACETATE 150 MG/ML IM SUSP
150.0000 mg | Freq: Once | INTRAMUSCULAR | Status: AC
  Administered 2020-09-20: 150 mg via INTRAMUSCULAR

## 2020-09-20 NOTE — Progress Notes (Signed)
Date last pap: 11/24/2019 Last Depo-Provera: 06/22/2020  Side Effects if any: none Serum HCG indicated? n/a Depo-Provera 150 mg IM given by: Lovell Sheehan, LPN Next appointment due April 28-Dec 20, 2020.     Pt stated that she has a rash under her breast area. Pt stated that it is the same area and issues that she had on 11/24/19 and was given a sample tube of cream from her pharmacy and it cleared up the area. Pt state is unsure of the name of the medication. Pt would like another refill of that cream sent to her pharmacy on file.

## 2020-09-20 NOTE — Progress Notes (Signed)
Rx Lostrisone, see orders.   I have reviewed the record and concur with patient management and plan of care.    Dani Gobble, CNM [facility], [facility] 09/20/20 4:38 PM

## 2020-10-11 ENCOUNTER — Ambulatory Visit: Payer: Medicare Other | Admitting: Physician Assistant

## 2020-10-22 ENCOUNTER — Other Ambulatory Visit: Payer: Self-pay

## 2020-10-22 ENCOUNTER — Ambulatory Visit (INDEPENDENT_AMBULATORY_CARE_PROVIDER_SITE_OTHER): Payer: PRIVATE HEALTH INSURANCE | Admitting: Physician Assistant

## 2020-10-22 ENCOUNTER — Encounter: Payer: Self-pay | Admitting: Physician Assistant

## 2020-10-22 DIAGNOSIS — R300 Dysuria: Secondary | ICD-10-CM

## 2020-10-22 DIAGNOSIS — Z0001 Encounter for general adult medical examination with abnormal findings: Secondary | ICD-10-CM

## 2020-10-22 DIAGNOSIS — R109 Unspecified abdominal pain: Secondary | ICD-10-CM | POA: Diagnosis not present

## 2020-10-22 DIAGNOSIS — G8929 Other chronic pain: Secondary | ICD-10-CM

## 2020-10-22 DIAGNOSIS — F17209 Nicotine dependence, unspecified, with unspecified nicotine-induced disorders: Secondary | ICD-10-CM | POA: Diagnosis not present

## 2020-10-22 DIAGNOSIS — F331 Major depressive disorder, recurrent, moderate: Secondary | ICD-10-CM

## 2020-10-22 NOTE — Progress Notes (Signed)
[facility] [place], [place] [address]  Internal MEDICINE  Office Visit Note  Patient Name: Stacy Pineda  [id number]  [id number]  Date of Service: 10/23/2020  Chief Complaint  Patient presents with  . Medicare Wellness  . Anxiety  . Depression      HPI Pt is here for routine health maintenance examination -Pt is doing better today. Still has abdominal pain and nausea and diarrhea. Also has a new pain in last 6 months. HX bilateral cysts on ovaries since 2020, has not been scanned this year. Followed by OBGYN. New  Pain is in LLQ and feels like menstrual cramp or deeper dull pain. Does not have cycles due to depo inj. Gets these 10-12 weeks. She has well woman exam with OBGYN in a few weeks and will discuss f/u on monitoring cysts.  -Hemorrhoids bleed depending on the day, and discussed banding in past, but is resistant to this. -Pain after eating/drinking is the same as in the past. Used to use fentanyl patch but started itching and was scared of getting too much. Preferred getting a few pills and using it occasionally. Requests refills today. -Headaches seem exacerbated since she had covid. -UTD on mammogram and colonoscopy in 2020 -Psych business closed, and is changing format. He prescribes Ensam and a few xanax as needed. She will get back in with him, but he will no longer be able to prescribe xanax due to telehealth format. She doesn't think she needs this anyways. -She is still smoking a pack of cigs per week. Wants to quit all together, but feels she needs something to help her for this.  Current Medication: Outpatient Encounter Medications as of 10/22/2020  Medication Sig  . ALPRAZolam (XANAX) 0.5 MG tablet Take 0.5 mg by mouth at bedtime as needed for anxiety.  . clotrimazole-betamethasone (LOTRISONE) cream Apply 1 application topically 2 (two) times daily.  Marland Kitchen ibuprofen (ADVIL) 600 MG tablet Take one tab po bid for headaches prn only  .  medroxyPROGESTERone (DEPO-PROVERA) 150 MG/ML injection Inject 1 mL (150 mg total) into the muscle every 3 (three) months.  . Multiple Vitamins-Minerals (MULTIVITAMIN ADULT PO) Take 1 tablet by mouth.  . nicotine (NICODERM CQ - DOSED IN MG/24 HR) 7 mg/24hr patch Place 1 patch (7 mg total) onto the skin daily.  . promethazine (PHENERGAN) 6.25 MG/5ML syrup TAKE  10 ML BY MOUTH EVERY 8 HOURS AS NEEDED FOR NAUSEA AND VOMITING OR  REFRACTORY  NAUSEA/VOMITING  . QUEtiapine (SEROQUEL) 25 MG tablet Take 25 mg by mouth at bedtime.  . selegiline (EMSAM) 6 MG/24HR Place 6 mg onto the skin daily.  Marland Kitchen oxyCODONE-acetaminophen (PERCOCET) 10-325 MG tablet Take half to one rab po qd prn for acute and severe pain  . [DISCONTINUED] oxyCODONE-acetaminophen (PERCOCET) 10-325 MG tablet Take half to one rab po qd prn for acute and severe pain   No facility-administered encounter medications on file as of 10/22/2020.    Surgical History: Past Surgical History:  Procedure Laterality Date  . ABDOMINAL SURGERY    . BREAST BIOPSY Right 04/27/2020   stereo bx, coil clip, path pending   . CHOLECYSTECTOMY    . COLONOSCOPY WITH PROPOFOL N/A 09/08/2018   Procedure: COLONOSCOPY WITH PROPOFOL;  Surgeon: Virgel Manifold, MD;  Location: [facility] ENDOSCOPY;  Service: Endoscopy;  Laterality: N/A;  . DECUBITUS ULCER EXCISION    . DILATION AND CURETTAGE OF UTERUS    . ESOPHAGOGASTRODUODENOSCOPY (EGD) WITH PROPOFOL N/A 09/08/2018   Procedure: ESOPHAGOGASTRODUODENOSCOPY (EGD)  WITH PROPOFOL;  Surgeon: Virgel Manifold, MD;  Location: [facility] ENDOSCOPY;  Service: Endoscopy;  Laterality: N/A;  . GASTRIC BYPASS    . GASTRIC BYPASS    . REVISION GASTRIC RESTRICTIVE PROCEDURE FOR MORBID OBESITY      Medical History: Past Medical History:  Diagnosis Date  . Anxiety   . Asthma   . Bilateral ovarian cysts   . Chronic headaches   . Depression   . Hypothyroidism   . Pancreatitis   . Pancreatitis   . PTSD (post-traumatic stress  disorder)     Family History: Family History  Problem Relation Age of Onset  . Graves' disease Mother   . Hypertension Mother   . Hyperlipidemia Mother   . Anxiety disorder Father   . Prostate cancer Father   . Depression Brother   . Anxiety disorder Brother   . Anxiety disorder Maternal Aunt   . Depression Maternal Aunt   . Anxiety disorder Paternal Aunt   . Depression Paternal Aunt   . Breast cancer Paternal Aunt   . Anxiety disorder Maternal Uncle   . Depression Maternal Uncle   . Anxiety disorder Paternal Uncle   . Depression Paternal Uncle   . Anxiety disorder Maternal Grandfather   . Depression Maternal Grandfather   . Diabetes Maternal Grandfather   . Anxiety disorder Maternal Grandmother   . Depression Maternal Grandmother   . Breast cancer Maternal Grandmother   . Ovarian cancer Maternal Grandmother   . Diabetes Maternal Grandmother   . Colon cancer Neg Hx       Review of Systems  Constitutional: Negative for chills, fatigue and unexpected weight change.  HENT: Negative for congestion, postnasal drip, rhinorrhea, sneezing and sore throat.   Eyes: Negative for redness.  Respiratory: Negative for cough, chest tightness and shortness of breath.   Cardiovascular: Negative for chest pain and palpitations.  Gastrointestinal: Positive for abdominal pain, diarrhea, nausea and vomiting.  Genitourinary: Positive for pelvic pain. Negative for dysuria, frequency, menstrual problem, vaginal bleeding, vaginal discharge and vaginal pain.  Musculoskeletal: Negative for arthralgias, back pain, joint swelling and neck pain.  Skin: Negative for rash.  Neurological: Positive for headaches. Negative for tremors and numbness.  Hematological: Negative for adenopathy. Does not bruise/bleed easily.  Psychiatric/Behavioral: Negative for behavioral problems (Depression), sleep disturbance and suicidal ideas. The patient is not nervous/anxious.      Vital Signs: BP 132/84   Pulse 94    Temp 97.8 F (36.6 C)   Resp 16   Ht 6\' 1"  (1.854 m)   Wt 167 lb (75.8 kg)   SpO2 100%   BMI 22.03 kg/m    Physical Exam Constitutional:      General: She is not in acute distress.    Appearance: She is well-developed and normal weight. She is not diaphoretic.  HENT:     Head: Normocephalic and atraumatic.     Right Ear: External ear normal.     Left Ear: External ear normal.     Nose: Nose normal.     Mouth/Throat:     Pharynx: No oropharyngeal exudate.  Eyes:     General: No scleral icterus.       Right eye: No discharge.        Left eye: No discharge.     Conjunctiva/sclera: Conjunctivae normal.     Pupils: Pupils are equal, round, and reactive to light.  Neck:     Thyroid: No thyromegaly.     Vascular: No JVD.  Trachea: No tracheal deviation.  Cardiovascular:     Rate and Rhythm: Normal rate and regular rhythm.     Heart sounds: Normal heart sounds. No murmur heard. No friction rub. No gallop.   Pulmonary:     Effort: Pulmonary effort is normal. No respiratory distress.     Breath sounds: Normal breath sounds. No stridor. No wheezing or rales.  Chest:     Chest wall: No tenderness.  Abdominal:     General: Bowel sounds are normal. There is no distension.     Palpations: Abdomen is soft. There is no mass.     Tenderness: There is abdominal tenderness. There is no guarding or rebound.     Comments: Generalized abdominal pain, no worse than previous visits  Musculoskeletal:        General: No tenderness or deformity. Normal range of motion.     Cervical back: Normal range of motion and neck supple.  Lymphadenopathy:     Cervical: No cervical adenopathy.  Skin:    General: Skin is warm and dry.     Coloration: Skin is not pale.     Findings: No erythema or rash.  Neurological:     Mental Status: She is alert.     Cranial Nerves: No cranial nerve deficit.     Motor: No abnormal muscle tone.     Coordination: Coordination normal.     Deep Tendon  Reflexes: Reflexes are normal and symmetric.  Psychiatric:        Behavior: Behavior normal.        Thought Content: Thought content normal.        Judgment: Judgment normal.      LABS: Recent Results (from the past 2160 hour(s))  UA/M w/rflx Culture, Routine     Status: Abnormal   Collection Time: 10/22/20  8:35 AM   Specimen: Urine   Urine  Result Value Ref Range   Specific Gravity, UA      >=1.030 (A) 1.005 - 1.030   pH, UA 5.5 5.0 - 7.5   Color, UA Yellow Yellow   Appearance Ur Clear Clear   Leukocytes,UA Negative Negative   Protein,UA Trace Negative/Trace   Glucose, UA Negative Negative   Ketones, UA Negative Negative   RBC, UA Negative Negative   Bilirubin, UA Negative Negative   Urobilinogen, Ur 0.2 0.2 - 1.0 mg/dL   Nitrite, UA Negative Negative   Microscopic Examination Comment     Comment: Microscopic follows if indicated.   Microscopic Examination See below:     Comment: Microscopic was indicated and was performed.   Urinalysis Reflex Comment     Comment: This specimen will not reflex to a Urine Culture.  Microscopic Examination     Status: None   Collection Time: 10/22/20  8:35 AM   Urine  Result Value Ref Range   WBC, UA None seen 0 - 5 /hpf   RBC None seen 0 - 2 /hpf   Epithelial Cells (non renal) 0-10 0 - 10 /hpf   Casts None seen None seen /lpf   Bacteria, UA None seen None seen/Few        Assessment/Plan: 1. Encounter for general adult medical examination with abnormal findings UTD on mammogram and colonoscopy. Pt given lab slip for routine fasting labs. Pt denies breast/pelvic exam today since she sees OBGYN for this in a few weeks.  2. Chronic abdominal pain May continue percocet as needed for severe pain.  3. Tobacco use disorder, continuous Smoking cessation  counseling: 1. Pt acknowledges the risks of long term smoking, she will try to quite smoking. 2. Options for different medications including nicotine products, chewing gum, patch  etc, Wellbutrin and Chantix is discussed 3. Goal and date of compete cessation is discussed 4. Total time spent in smoking cessation is 10 min.  Will start low dose nicotine patch--educated that she should not wear patch if she is going to smoke a cigarette since it can increase risk of stroke. Pt expressed understanding.  4. MDD (major depressive disorder), recurrent episode, moderate ([place]) Followed by psych.  5. Dysuria - UA/M w/rflx Culture, Routine   General Counseling: Matraca verbalizes understanding of the findings of todays visit and agrees with plan of treatment. I have discussed any further diagnostic evaluation that may be needed or ordered today. We also reviewed her medications today. she has been encouraged to call the office with any questions or concerns that should arise related to todays visit.    Counseling:    Orders Placed This Encounter  Procedures  . Microscopic Examination  . UA/M w/rflx Culture, Routine    Meds ordered this encounter  Medications  . oxyCODONE-acetaminophen (PERCOCET) 10-325 MG tablet    Sig: Take half to one rab po qd prn for acute and severe pain    Dispense:  10 tablet    Refill:  0  . nicotine (NICODERM CQ - DOSED IN MG/24 HR) 7 mg/24hr patch    Sig: Place 1 patch (7 mg total) onto the skin daily.    Dispense:  28 patch    Refill:  0    This patient was seen by Drema Dallas, PA-C in collaboration with Dr. Clayborn Bigness as a part of collaborative care agreement.  Total time spent:30 Minutes  Time spent includes review of chart, medications, test results, and follow up plan with the patient.     Lavera Guise, MD  Internal Medicine

## 2020-10-23 LAB — UA/M W/RFLX CULTURE, ROUTINE
Bilirubin, UA: NEGATIVE
Glucose, UA: NEGATIVE
Ketones, UA: NEGATIVE
Leukocytes,UA: NEGATIVE
Nitrite, UA: NEGATIVE
RBC, UA: NEGATIVE
Specific Gravity, UA: >=1.03 — AB (ref 1.005–1.030)
Urobilinogen, Ur: 0.2 mg/dL (ref 0.2–1.0)
pH, UA: 5.5 (ref 5.0–7.5)

## 2020-10-23 LAB — MICROSCOPIC EXAMINATION
Bacteria, UA: NONE SEEN
Casts: NONE SEEN /lpf
RBC, Urine: NONE SEEN /hpf (ref 0–2)
WBC, UA: NONE SEEN /hpf (ref 0–5)

## 2020-10-23 MED ORDER — NICOTINE 7 MG/24HR TD PT24: 7.0000 mg | MEDICATED_PATCH | Freq: Every day | TRANSDERMAL | 0 refills | Status: DC

## 2020-10-23 MED ORDER — OXYCODONE-ACETAMINOPHEN 10-325 MG PO TABS: ORAL_TABLET | ORAL | 0 refills | Status: DC

## 2020-12-04 ENCOUNTER — Other Ambulatory Visit: Payer: Self-pay | Admitting: Physician Assistant

## 2020-12-04 ENCOUNTER — Telehealth: Payer: Self-pay

## 2020-12-04 DIAGNOSIS — R928 Other abnormal and inconclusive findings on diagnostic imaging of breast: Secondary | ICD-10-CM

## 2020-12-04 NOTE — Telephone Encounter (Signed)
Order placed for diagnostic mammogram

## 2020-12-05 NOTE — Telephone Encounter (Signed)
[name] to inform pt that the order for a diagnostic mammogram was placed

## 2020-12-11 ENCOUNTER — Other Ambulatory Visit: Payer: Self-pay | Admitting: Physician Assistant

## 2020-12-11 DIAGNOSIS — R928 Other abnormal and inconclusive findings on diagnostic imaging of breast: Secondary | ICD-10-CM

## 2020-12-13 ENCOUNTER — Other Ambulatory Visit: Payer: Self-pay | Admitting: Certified Nurse Midwife

## 2020-12-13 ENCOUNTER — Telehealth: Payer: Self-pay | Admitting: Certified Nurse Midwife

## 2020-12-13 NOTE — Telephone Encounter (Signed)
Patient has an appointment tomorrow with Sharyn Lull and was supposed to receive her Depo injection during this visit.  Patient went to pharmacy to fill the Depo and they told her the prescription is expired.  Patient would like a new prescription sent in to [facility] in [place] so she may bring it to her appointment tomorrow.  Please advise.

## 2020-12-13 NOTE — Telephone Encounter (Signed)
We can provided patient with Depo in office tomorrow and renew her prescription. Thanks, JML

## 2020-12-13 NOTE — Telephone Encounter (Signed)
Patient called.  Patient aware.  

## 2020-12-14 ENCOUNTER — Ambulatory Visit (INDEPENDENT_AMBULATORY_CARE_PROVIDER_SITE_OTHER): Payer: Medicare Other | Admitting: Certified Nurse Midwife

## 2020-12-14 ENCOUNTER — Encounter: Payer: Self-pay | Admitting: Certified Nurse Midwife

## 2020-12-14 ENCOUNTER — Other Ambulatory Visit: Payer: Self-pay

## 2020-12-14 VITALS — BP 159/104 | HR 90 | Resp 16 | Ht 73.0 in | Wt 172.7 lb

## 2020-12-14 DIAGNOSIS — Z8742 Personal history of other diseases of the female genital tract: Secondary | ICD-10-CM

## 2020-12-14 DIAGNOSIS — Z3042 Encounter for surveillance of injectable contraceptive: Secondary | ICD-10-CM

## 2020-12-14 DIAGNOSIS — N921 Excessive and frequent menstruation with irregular cycle: Secondary | ICD-10-CM

## 2020-12-14 DIAGNOSIS — Z01419 Encounter for gynecological examination (general) (routine) without abnormal findings: Secondary | ICD-10-CM | POA: Diagnosis not present

## 2020-12-14 DIAGNOSIS — R102 Pelvic and perineal pain: Secondary | ICD-10-CM | POA: Diagnosis not present

## 2020-12-14 MED ORDER — MEDROXYPROGESTERONE ACETATE 150 MG/ML IM SUSP
150.0000 mg | Freq: Once | INTRAMUSCULAR | Status: AC
  Administered 2020-12-14: 150 mg via INTRAMUSCULAR

## 2020-12-14 MED ORDER — MEDROXYPROGESTERONE ACETATE 150 MG/ML IM SUSP: 150.0000 mg | INTRAMUSCULAR | 4 refills | Status: DC

## 2020-12-14 NOTE — Progress Notes (Signed)
ANNUAL PREVENTATIVE CARE GYN  ENCOUNTER NOTE  Subjective:       Stacy Pineda is a 40 y.o. G83P0010 female here for a routine annual gynecologic exam.  Current complaints: 1. Requests Depo refill, injection due 2. Pelvic pain and history of ovarian cyst 3. Breakthrough bleeding on Depo-provera  Denies difficulty breathing or respiratory distress, chest pain, dysuria, and leg pain or swelling.    Gynecologic History  No LMP recorded. Patient has had an injection.  Contraception: Depo-Provera injections  Last Pap: 11/2019. Results were: Neg/Neg  Last mammogram: 03/2020. Results were: abnormal  Obstetric History OB History  Gravida Para Term Preterm AB Living  1       1    SAB IAB Ectopic Multiple Live Births  1            # Outcome Date GA Lbr Len/2nd Weight Sex Delivery Anes PTL Lv  1 SAB 1999            Past Medical History:  Diagnosis Date  . Anxiety   . Asthma   . Bilateral ovarian cysts   . Chronic headaches   . Depression   . Hypothyroidism   . Pancreatitis   . Pancreatitis   . PTSD (post-traumatic stress disorder)     Past Surgical History:  Procedure Laterality Date  . ABDOMINAL SURGERY    . BREAST BIOPSY Right 04/27/2020   stereo bx, coil clip, path pending   . CHOLECYSTECTOMY    . COLONOSCOPY WITH PROPOFOL N/A 09/08/2018   Procedure: COLONOSCOPY WITH PROPOFOL;  Surgeon: Virgel Manifold, MD;  Location: [facility] ENDOSCOPY;  Service: Endoscopy;  Laterality: N/A;  . DECUBITUS ULCER EXCISION    . DILATION AND CURETTAGE OF UTERUS    . ESOPHAGOGASTRODUODENOSCOPY (EGD) WITH PROPOFOL N/A 09/08/2018   Procedure: ESOPHAGOGASTRODUODENOSCOPY (EGD) WITH PROPOFOL;  Surgeon: Virgel Manifold, MD;  Location: [facility] ENDOSCOPY;  Service: Endoscopy;  Laterality: N/A;  . GASTRIC BYPASS    . GASTRIC BYPASS    . REVISION GASTRIC RESTRICTIVE PROCEDURE FOR MORBID OBESITY      Current Outpatient Medications on File Prior to Visit  Medication Sig Dispense Refill  .  clotrimazole-betamethasone (LOTRISONE) cream Apply 1 application topically 2 (two) times daily. 30 g 0  . ibuprofen (ADVIL) 600 MG tablet Take one tab po bid for headaches prn only 30 tablet 0  . Multiple Vitamins-Minerals (MULTIVITAMIN ADULT PO) Take 1 tablet by mouth.    . oxyCODONE-acetaminophen (PERCOCET) 10-325 MG tablet Take half to one rab po qd prn for acute and severe pain 10 tablet 0  . promethazine (PHENERGAN) 6.25 MG/5ML syrup TAKE  10 ML BY MOUTH EVERY 8 HOURS AS NEEDED FOR NAUSEA AND VOMITING OR  REFRACTORY  NAUSEA/VOMITING 240 mL 2  . QUEtiapine (SEROQUEL) 100 MG tablet Take 100 mg by mouth at bedtime.     No current facility-administered medications on file prior to visit.    Allergies  Allergen Reactions  . Fentanyl Itching  . Morphine And Related Itching    Itchy.   . Vicodin [Hydrocodone-Acetaminophen] Itching    Social History   Socioeconomic History  . Marital status: Single    Spouse name: Not on file  . Number of children: Not on file  . Years of education: Not on file  . Highest education level: Not on file  Occupational History  . Not on file  Tobacco Use  . Smoking status: Current Some Day Smoker    Packs/day: 0.25    Types:  Cigarettes    Start date: 03/26/2005  . Smokeless tobacco: Never Used  . Tobacco comment: working on stopping  Vaping Use  . Vaping Use: Never used  Substance and Sexual Activity  . Alcohol use: No    Alcohol/week: 0.0 standard drinks  . Drug use: Yes    Frequency: 3.0 times per week    Types: Marijuana    Comment: MJ use weekly  . Sexual activity: Not Currently    Birth control/protection: Injection  Other Topics Concern  . Not on file  Social History Narrative  . Not on file   Social Determinants of Health   Financial Resource Strain: Not on file  Food Insecurity: Not on file  Transportation Needs: Not on file  Physical Activity: Not on file  Stress: Not on file  Social Connections: Not on file  Intimate  Partner Violence: Not on file    Family History  Problem Relation Age of Onset  . Graves' disease Mother   . Hypertension Mother   . Hyperlipidemia Mother   . Anxiety disorder Father   . Prostate cancer Father   . Depression Brother   . Anxiety disorder Brother   . Anxiety disorder Maternal Aunt   . Depression Maternal Aunt   . Anxiety disorder Paternal Aunt   . Depression Paternal Aunt   . Breast cancer Paternal Aunt   . Anxiety disorder Maternal Uncle   . Depression Maternal Uncle   . Anxiety disorder Paternal Uncle   . Depression Paternal Uncle   . Anxiety disorder Maternal Grandfather   . Depression Maternal Grandfather   . Diabetes Maternal Grandfather   . Anxiety disorder Maternal Grandmother   . Depression Maternal Grandmother   . Breast cancer Maternal Grandmother   . Ovarian cancer Maternal Grandmother   . Diabetes Maternal Grandmother   . Colon cancer Neg Hx     The following portions of the patient's history were reviewed and updated as appropriate: allergies, current medications, past family history, past medical history, past social history, past surgical history and problem list.  Review of Systems  ROS negative except as noted above. Information obtained from patient.    Objective:   BP (!) 159/104   Pulse 90   Resp 16   Ht 6\' 1"  (1.854 m)   Wt 172 lb 11.2 oz (78.3 kg)   BMI 22.79 kg/m    CONSTITUTIONAL: Well-developed, well-nourished female in no acute distress.   PSYCHIATRIC: Normal mood and affect. Normal behavior. Normal judgment and thought content.  Ruth: Alert and oriented to person, place, and time. Normal muscle tone coordination. No cranial nerve deficit noted.  HENT:  Normocephalic, atraumatic.  EYES: Conjunctivae and EOM are normal.  NECK: Normal range of motion, supple, no masses.  Normal thyroid.   SKIN: Skin is warm and dry. No rash noted. Not diaphoretic. No erythema. No pallor. Professional tattoos present.    CARDIOVASCULAR: Normal heart rate noted, regular rhythm, no murmur.  RESPIRATORY: Clear to auscultation bilaterally. Effort and breath sounds normal, no problems with respiration noted.  BREASTS: Symmetric in size. No masses, skin changes, nipple drainage, or lymphadenopathy.  ABDOMEN: Soft, normal bowel sounds, no distention noted.  No tenderness, rebound or guarding.   PELVIC:  External Genitalia: Normal  Vagina: Normal  Cervix: Normal  Uterus: Normal  Adnexa: Normal  MUSCULOSKELETAL: Normal range of motion. No tenderness.  No cyanosis, clubbing, or edema.  2+ distal pulses.  LYMPHATIC: No Axillary, Supraclavicular, or Inguinal Adenopathy.  Assessment:  Annual gynecologic examination 40 y.o.   Contraception: Depo-Provera injections   Normal BMI   Problem List Items Addressed This Visit      Other   Breakthrough bleeding on depo provera   Relevant Orders   US PELVIC COMPLETE WITH TRANSVAGINAL   History of ovarian cyst   Relevant Orders   US PELVIC COMPLETE WITH TRANSVAGINAL   Encounter for Depo-Provera contraception    Other Visit Diagnoses    Well woman exam    -  Primary   Pelvic pain in female       Relevant Orders   US PELVIC COMPLETE WITH TRANSVAGINAL      Plan:   Pap: Not needed   Mammogram: Ordered   Labs: Managed by PCP  Routine preventative health maintenance measures emphasized: Exercise/Diet/Weight control, Tobacco Warnings, Alcohol/Substance use risks and Stress Management; see AVS  Ultrasound ordered, see chart  Rx Depo, see orders; injection given today  Reviewed red flag symptoms and when to call  Return to Clinic - 1 Year for Lorraine, CNM  [facility] 12/14/20 3:10 PM

## 2020-12-14 NOTE — Patient Instructions (Signed)
Preventive Care 42-42 Years Old, Female Preventive care refers to lifestyle choices and visits with your health care provider that can promote health and wellness. This includes:  A yearly physical exam. This is also called an annual wellness visit.  Regular dental and eye exams.  Immunizations.  Screening for certain conditions.  Healthy lifestyle choices, such as: ? Eating a healthy diet. ? Getting regular exercise. ? Not using drugs or products that contain nicotine and tobacco. ? Limiting alcohol use. What can I expect for my preventive care visit? Physical exam Your health care provider will check your:  Height and weight. These may be used to calculate your BMI (body mass index). BMI is a measurement that tells if you are at a healthy weight.  Heart rate and blood pressure.  Body temperature.  Skin for abnormal spots. Counseling Your health care provider may ask you questions about your:  Past medical problems.  Family's medical history.  Alcohol, tobacco, and drug use.  Emotional well-being.  Home life and relationship well-being.  Sexual activity.  Diet, exercise, and sleep habits.  Work and work Statistician.  Access to firearms.  Method of birth control.  Menstrual cycle.  Pregnancy history. What immunizations do I need? Vaccines are usually given at various ages, according to a schedule. Your health care provider will recommend vaccines for you based on your age, medical history, and lifestyle or other factors, such as travel or where you work.   What tests do I need? Blood tests  Lipid and cholesterol levels. These may be checked every 5 years, or more often if you are over 3 years old.  Hepatitis C test.  Hepatitis B test. Screening  Lung cancer screening. You may have this screening every year starting at age 73 if you have a 30-pack-year history of smoking and currently smoke or have quit within the past 15 years.  Colorectal cancer  screening. ? All adults should have this screening starting at age 52 and continuing until age 17. ? Your health care provider may recommend screening at age 49 if you are at increased risk. ? You will have tests every 1-10 years, depending on your results and the type of screening test.  Diabetes screening. ? This is done by checking your blood sugar (glucose) after you have not eaten for a while (fasting). ? You may have this done every 1-3 years.  Mammogram. ? This may be done every 1-2 years. ? Talk with your health care provider about when you should start having regular mammograms. This may depend on whether you have a family history of breast cancer.  BRCA-related cancer screening. This may be done if you have a family history of breast, ovarian, tubal, or peritoneal cancers.  Pelvic exam and Pap test. ? This may be done every 3 years starting at age 10. ? Starting at age 11, this may be done every 5 years if you have a Pap test in combination with an HPV test. Other tests  STD (sexually transmitted disease) testing, if you are at risk.  Bone density scan. This is done to screen for osteoporosis. You may have this scan if you are at high risk for osteoporosis. Talk with your health care provider about your test results, treatment options, and if necessary, the need for more tests. Follow these instructions at home: Eating and drinking  Eat a diet that includes fresh fruits and vegetables, whole grains, lean protein, and low-fat dairy products.  Take vitamin and mineral supplements  as recommended by your health care provider.  Do not drink alcohol if: ? Your health care provider tells you not to drink. ? You are pregnant, may be pregnant, or are planning to become pregnant.  If you drink alcohol: ? Limit how much you have to 0-1 drink a day. ? Be aware of how much alcohol is in your drink. In the [place] one drink equals one 12 oz bottle of beer (355 mL), one 5 oz glass of  wine (148 mL), or one 1 oz glass of hard liquor (44 mL).   Lifestyle  Take daily care of your teeth and gums. Brush your teeth every morning and night with fluoride toothpaste. Floss one time each day.  Stay active. Exercise for at least 30 minutes 5 or more days each week.  Do not use any products that contain nicotine or tobacco, such as cigarettes, e-cigarettes, and chewing tobacco. If you need help quitting, ask your health care provider.  Do not use drugs.  If you are sexually active, practice safe sex. Use a condom or other form of protection to prevent STIs (sexually transmitted infections).  If you do not wish to become pregnant, use a form of birth control. If you plan to become pregnant, see your health care provider for a prepregnancy visit.  If told by your health care provider, take low-dose aspirin daily starting at age 24.  Find healthy ways to cope with stress, such as: ? Meditation, yoga, or listening to music. ? Journaling. ? Talking to a trusted person. ? Spending time with friends and family. Safety  Always wear your seat belt while driving or riding in a vehicle.  Do not drive: ? If you have been drinking alcohol. Do not ride with someone who has been drinking. ? When you are tired or distracted. ? While texting.  Wear a helmet and other protective equipment during sports activities.  If you have firearms in your house, make sure you follow all gun safety procedures. What's next?  Visit your health care provider once a year for an annual wellness visit.  Ask your health care provider how often you should have your eyes and teeth checked.  Stay up to date on all vaccines. This information is not intended to replace advice given to you by your health care provider. Make sure you discuss any questions you have with your health care provider. Document Revised: 05/01/2020 Document Reviewed: 04/08/2018 Elsevier Patient Education  2021 [place].   Contraceptive Injection A contraceptive injection is a shot that prevents pregnancy. It is also called a birth control shot. The shot contains the hormone progestin, which prevents pregnancy by:  Stopping the ovaries from releasing eggs.  Thickening cervical mucus to prevent sperm from entering the cervix.  Thinning the lining of the uterus to prevent a fertilized egg from attaching to the uterus. Contraceptive injections are given under the skin (subcutaneous) or into a muscle (intramuscular). For these shots to work, you must get one of them every 3 months (12-13 weeks) from a health care provider. Tell a health care provider about:  Any allergies you have.  All medicines you are taking, including vitamins, herbs, eye drops, creams, and over-the-counter medicines.  Any blood disorders you have.  Any medical conditions you have.  Whether you are pregnant or may be pregnant. What are the risks? Generally, this is a safe procedure. However, problems may occur, including:  Mood changes or depression.  Loss of bone density (osteoporosis) after  long-term use.  Blood clots. This is rare.  Higher risk of an egg being fertilized outside your uterus (ectopic pregnancy).This is rare. What happens before the procedure?  Your health care provider may do a routine physical exam.  You may have a test to make sure you are not pregnant. What happens during the procedure?  The area where the shot will be given will be cleaned and sanitized with alcohol.  A needle will be inserted into a muscle in your upper arm or buttock, or into the skin of your thigh or abdomen. The needle will be attached to a syringe with the medicine inside of it.  The medicine will be pushed through the syringe and injected into your body.  A small bandage (dressing) may be placed over the injection site.   What can I expect after the procedure?  After the procedure, it is common to have: ? Soreness  around the injection site for a couple of days. ? Irregular menstrual bleeding. ? Weight gain. ? Breast tenderness. ? Headaches. ? Discomfort in your abdomen.  Ask your health care provider whether you need to use an added method of birth control (backup contraception), such as a condom, sponge, or spermicide. ? If the first shot is given 1-7 days after the start of your last menstrual period, you will not need backup contraception. ? If the first shot is given at any other time during your menstrual cycle, you should avoid having sex. If you do have sex, you will need to use backup contraception for 7 days after you receive the shot. Follow these instructions at home: General instructions  Take over-the-counter and prescription medicines only as told by your health care provider.  Do not rub or massage the injection site.  Track your menstrual periods so you will know if they become irregular.  Always use a condom to protect against sexually transmitted infections (STIs).  Make sure you schedule an appointment in time for your next shot and mark it on your calendar. You must get an injection every 3 months (12-13 weeks) to prevent pregnancy. Lifestyle  Do not use any products that contain nicotine or tobacco. These products include cigarettes, chewing tobacco, and vaping devices, such as e-cigarettes. If you need help quitting, ask your health care provider.  Eat foods that are high in calcium and vitamin D, such as milk, cheese, and salmon. Doing this may help with any loss in bone density caused by the contraceptive injection. Ask your health care provider for dietary recommendations. Contact a health care provider if you:  Have nausea or vomiting.  Have abnormal vaginal discharge or bleeding.  Miss a menstrual period or think you might be pregnant.  Experience mood changes or depression.  Feel dizzy or light-headed.  Have leg pain. Get help right away if you:  Have chest  pain or cough up blood.  Have shortness of breath.  Have a severe headache that does not go away.  Have numbness in any part of your body.  Have slurred speech or vision problems.  Have vaginal bleeding that is abnormally heavy or does not stop, or you have severe pain in your abdomen.  Have depression that does not get better with treatment. If you ever feel like you may hurt yourself or others, or have thoughts about taking your own life, get help right away. Go to your nearest emergency department or:  Call your local emergency services (911 in the [place]  Call a suicide crisis  helpline, such as the [facility] at 979 644 8348. This is open 24 hours a day in the [place]  Text the Crisis Text Line at (430)518-8698 (in the [place] Summary  A contraceptive injection is a shot that prevents pregnancy. It is also called the birth control shot.  The shot is given under the skin (subcutaneous) or into a muscle (intramuscular).  After this procedure, it is common to have soreness around the injection site for a couple of days.  To prevent pregnancy, the shot must be given by a health care provider every 3 months (12-13 weeks).  After you have the shot, ask your health care provider whether you need to use an added method of birth control (backup contraception), such as a condom, sponge, or spermicide. This information is not intended to replace advice given to you by your health care provider. Make sure you discuss any questions you have with your health care provider. Document Revised: 02/06/2020 Document Reviewed: 02/06/2020 Elsevier Patient Education  [place].

## 2021-01-03 DIAGNOSIS — H5213 Myopia, bilateral: Secondary | ICD-10-CM | POA: Diagnosis not present

## 2021-01-09 ENCOUNTER — Encounter: Payer: Self-pay | Admitting: Oncology

## 2021-01-09 ENCOUNTER — Ambulatory Visit
Admission: RE | Admit: 2021-01-09 | Discharge: 2021-01-09 | Disposition: A | Discharge status: Home / Self Care | Payer: BC Managed Care – PPO | Source: Ambulatory Visit | Attending: Certified Nurse Midwife | Admitting: Certified Nurse Midwife

## 2021-01-09 ENCOUNTER — Other Ambulatory Visit: Payer: Self-pay

## 2021-01-09 DIAGNOSIS — Z8742 Personal history of other diseases of the female genital tract: Secondary | ICD-10-CM | POA: Diagnosis not present

## 2021-01-09 DIAGNOSIS — N939 Abnormal uterine and vaginal bleeding, unspecified: Secondary | ICD-10-CM | POA: Diagnosis not present

## 2021-01-09 DIAGNOSIS — N921 Excessive and frequent menstruation with irregular cycle: Secondary | ICD-10-CM | POA: Diagnosis not present

## 2021-01-09 DIAGNOSIS — R102 Pelvic and perineal pain: Secondary | ICD-10-CM | POA: Insufficient documentation

## 2021-01-22 ENCOUNTER — Other Ambulatory Visit: Payer: Self-pay | Admitting: Internal Medicine

## 2021-01-22 ENCOUNTER — Telehealth: Payer: Self-pay

## 2021-01-22 ENCOUNTER — Other Ambulatory Visit: Payer: Self-pay

## 2021-01-22 DIAGNOSIS — E538 Deficiency of other specified B group vitamins: Secondary | ICD-10-CM

## 2021-01-22 DIAGNOSIS — R109 Unspecified abdominal pain: Secondary | ICD-10-CM

## 2021-01-22 DIAGNOSIS — Z0001 Encounter for general adult medical examination with abnormal findings: Secondary | ICD-10-CM

## 2021-01-22 DIAGNOSIS — F17209 Nicotine dependence, unspecified, with unspecified nicotine-induced disorders: Secondary | ICD-10-CM

## 2021-01-22 DIAGNOSIS — I10 Essential (primary) hypertension: Secondary | ICD-10-CM

## 2021-01-22 DIAGNOSIS — G8929 Other chronic pain: Secondary | ICD-10-CM

## 2021-01-22 DIAGNOSIS — G44229 Chronic tension-type headache, not intractable: Secondary | ICD-10-CM

## 2021-01-22 NOTE — Telephone Encounter (Signed)
Done

## 2021-01-22 NOTE — Telephone Encounter (Signed)
[name] that we put labs order in epic

## 2021-01-24 ENCOUNTER — Ambulatory Visit: Payer: BC Managed Care – PPO | Admitting: Physician Assistant

## 2021-03-11 ENCOUNTER — Ambulatory Visit: Payer: Medicare Other

## 2021-03-12 ENCOUNTER — Ambulatory Visit: Payer: Medicare Other

## 2021-03-21 ENCOUNTER — Ambulatory Visit: Payer: Medicare Other

## 2021-03-21 ENCOUNTER — Encounter: Payer: Self-pay | Admitting: Certified Nurse Midwife

## 2021-05-15 DIAGNOSIS — R109 Unspecified abdominal pain: Secondary | ICD-10-CM | POA: Diagnosis not present

## 2021-05-15 DIAGNOSIS — F410 Panic disorder [episodic paroxysmal anxiety] without agoraphobia: Secondary | ICD-10-CM | POA: Diagnosis not present

## 2021-05-15 DIAGNOSIS — F17290 Nicotine dependence, other tobacco product, uncomplicated: Secondary | ICD-10-CM | POA: Diagnosis not present

## 2021-05-15 DIAGNOSIS — R1013 Epigastric pain: Secondary | ICD-10-CM | POA: Diagnosis not present

## 2021-05-15 DIAGNOSIS — Z86718 Personal history of other venous thrombosis and embolism: Secondary | ICD-10-CM | POA: Diagnosis not present

## 2021-05-15 DIAGNOSIS — Z885 Allergy status to narcotic agent status: Secondary | ICD-10-CM | POA: Diagnosis not present

## 2021-05-15 DIAGNOSIS — Z6822 Body mass index (BMI) 22.0-22.9, adult: Secondary | ICD-10-CM | POA: Diagnosis not present

## 2021-05-15 DIAGNOSIS — K861 Other chronic pancreatitis: Secondary | ICD-10-CM | POA: Diagnosis not present

## 2021-05-15 DIAGNOSIS — R112 Nausea with vomiting, unspecified: Secondary | ICD-10-CM | POA: Diagnosis not present

## 2021-05-15 DIAGNOSIS — F4310 Post-traumatic stress disorder, unspecified: Secondary | ICD-10-CM | POA: Diagnosis not present

## 2021-05-16 DIAGNOSIS — K861 Other chronic pancreatitis: Secondary | ICD-10-CM | POA: Diagnosis not present

## 2021-05-20 DIAGNOSIS — K649 Unspecified hemorrhoids: Secondary | ICD-10-CM | POA: Diagnosis not present

## 2021-05-20 DIAGNOSIS — Z79899 Other long term (current) drug therapy: Secondary | ICD-10-CM | POA: Diagnosis not present

## 2021-05-20 DIAGNOSIS — I10 Essential (primary) hypertension: Secondary | ICD-10-CM | POA: Diagnosis not present

## 2021-05-20 DIAGNOSIS — E278 Other specified disorders of adrenal gland: Secondary | ICD-10-CM | POA: Diagnosis not present

## 2021-05-20 DIAGNOSIS — Z79891 Long term (current) use of opiate analgesic: Secondary | ICD-10-CM | POA: Diagnosis not present

## 2021-05-20 DIAGNOSIS — Z9884 Bariatric surgery status: Secondary | ICD-10-CM | POA: Diagnosis not present

## 2021-05-20 DIAGNOSIS — K2100 Gastro-esophageal reflux disease with esophagitis, without bleeding: Secondary | ICD-10-CM | POA: Diagnosis not present

## 2021-05-20 DIAGNOSIS — F411 Generalized anxiety disorder: Secondary | ICD-10-CM | POA: Diagnosis not present

## 2021-05-20 DIAGNOSIS — R1012 Left upper quadrant pain: Secondary | ICD-10-CM | POA: Diagnosis not present

## 2021-05-20 DIAGNOSIS — K289 Gastrojejunal ulcer, unspecified as acute or chronic, without hemorrhage or perforation: Secondary | ICD-10-CM | POA: Diagnosis not present

## 2021-05-20 DIAGNOSIS — R109 Unspecified abdominal pain: Secondary | ICD-10-CM | POA: Diagnosis not present

## 2021-05-20 DIAGNOSIS — Z86718 Personal history of other venous thrombosis and embolism: Secondary | ICD-10-CM | POA: Diagnosis not present

## 2021-05-20 DIAGNOSIS — J4520 Mild intermittent asthma, uncomplicated: Secondary | ICD-10-CM | POA: Diagnosis not present

## 2021-05-20 DIAGNOSIS — Z7951 Long term (current) use of inhaled steroids: Secondary | ICD-10-CM | POA: Diagnosis not present

## 2021-05-20 DIAGNOSIS — Z6823 Body mass index (BMI) 23.0-23.9, adult: Secondary | ICD-10-CM | POA: Diagnosis not present

## 2021-05-20 DIAGNOSIS — Z885 Allergy status to narcotic agent status: Secondary | ICD-10-CM | POA: Diagnosis not present

## 2021-05-20 DIAGNOSIS — F4310 Post-traumatic stress disorder, unspecified: Secondary | ICD-10-CM | POA: Diagnosis not present

## 2021-05-20 DIAGNOSIS — R112 Nausea with vomiting, unspecified: Secondary | ICD-10-CM | POA: Diagnosis not present

## 2021-05-20 DIAGNOSIS — F17290 Nicotine dependence, other tobacco product, uncomplicated: Secondary | ICD-10-CM | POA: Diagnosis not present

## 2021-05-20 DIAGNOSIS — K529 Noninfective gastroenteritis and colitis, unspecified: Secondary | ICD-10-CM | POA: Diagnosis not present

## 2021-05-20 DIAGNOSIS — F410 Panic disorder [episodic paroxysmal anxiety] without agoraphobia: Secondary | ICD-10-CM | POA: Diagnosis not present

## 2021-05-20 DIAGNOSIS — K861 Other chronic pancreatitis: Secondary | ICD-10-CM | POA: Diagnosis not present

## 2021-05-20 DIAGNOSIS — G8929 Other chronic pain: Secondary | ICD-10-CM | POA: Diagnosis not present

## 2021-05-20 DIAGNOSIS — Z20822 Contact with and (suspected) exposure to covid-19: Secondary | ICD-10-CM | POA: Diagnosis not present

## 2021-05-20 DIAGNOSIS — R1011 Right upper quadrant pain: Secondary | ICD-10-CM | POA: Diagnosis not present

## 2021-05-20 DIAGNOSIS — D509 Iron deficiency anemia, unspecified: Secondary | ICD-10-CM | POA: Diagnosis not present

## 2021-05-21 ENCOUNTER — Telehealth: Payer: Self-pay

## 2021-05-21 DIAGNOSIS — R112 Nausea with vomiting, unspecified: Secondary | ICD-10-CM | POA: Diagnosis not present

## 2021-05-21 DIAGNOSIS — R1012 Left upper quadrant pain: Secondary | ICD-10-CM | POA: Diagnosis not present

## 2021-05-21 DIAGNOSIS — K861 Other chronic pancreatitis: Secondary | ICD-10-CM | POA: Diagnosis not present

## 2021-05-21 DIAGNOSIS — D509 Iron deficiency anemia, unspecified: Secondary | ICD-10-CM | POA: Diagnosis not present

## 2021-05-21 NOTE — Telephone Encounter (Signed)
Received FMLA paperwork from patients employer. I contacted patient advising they needed an appointment in order for [place] to complete her paperwork. She advised me she is currently admitted and after she is discharged I asked her to contact us to schedule an appointment. Will hold paperwork at the front desk.

## 2021-05-22 DIAGNOSIS — R627 Adult failure to thrive: Secondary | ICD-10-CM | POA: Diagnosis not present

## 2021-05-22 DIAGNOSIS — K861 Other chronic pancreatitis: Secondary | ICD-10-CM | POA: Diagnosis not present

## 2021-05-22 DIAGNOSIS — D509 Iron deficiency anemia, unspecified: Secondary | ICD-10-CM | POA: Diagnosis not present

## 2021-05-22 DIAGNOSIS — F419 Anxiety disorder, unspecified: Secondary | ICD-10-CM | POA: Diagnosis not present

## 2021-05-23 DIAGNOSIS — I10 Essential (primary) hypertension: Secondary | ICD-10-CM | POA: Diagnosis not present

## 2021-05-23 DIAGNOSIS — F419 Anxiety disorder, unspecified: Secondary | ICD-10-CM | POA: Diagnosis not present

## 2021-05-23 DIAGNOSIS — R197 Diarrhea, unspecified: Secondary | ICD-10-CM | POA: Diagnosis not present

## 2021-05-23 DIAGNOSIS — R1012 Left upper quadrant pain: Secondary | ICD-10-CM | POA: Diagnosis not present

## 2021-05-24 ENCOUNTER — Telehealth: Payer: Self-pay

## 2021-05-24 NOTE — Telephone Encounter (Signed)
Short term disability paperwork given to [facility].

## 2021-05-30 ENCOUNTER — Ambulatory Visit (INDEPENDENT_AMBULATORY_CARE_PROVIDER_SITE_OTHER): Payer: Medicare Other | Admitting: Internal Medicine

## 2021-05-30 ENCOUNTER — Encounter: Payer: Self-pay | Admitting: Internal Medicine

## 2021-05-30 ENCOUNTER — Other Ambulatory Visit: Payer: Self-pay

## 2021-05-30 VITALS — BP 130/96 | HR 86 | Temp 98.4°F | Resp 16 | Ht 72.0 in | Wt 180.0 lb

## 2021-05-30 DIAGNOSIS — G8929 Other chronic pain: Secondary | ICD-10-CM

## 2021-05-30 DIAGNOSIS — R109 Unspecified abdominal pain: Secondary | ICD-10-CM | POA: Diagnosis not present

## 2021-05-30 DIAGNOSIS — K861 Other chronic pancreatitis: Secondary | ICD-10-CM | POA: Diagnosis not present

## 2021-05-30 DIAGNOSIS — F331 Major depressive disorder, recurrent, moderate: Secondary | ICD-10-CM | POA: Diagnosis not present

## 2021-05-30 MED ORDER — PREGABALIN 25 MG PO CAPS: 25.0000 mg | ORAL_CAPSULE | Freq: Two times a day (BID) | ORAL | 3 refills | Status: DC

## 2021-05-30 MED ORDER — VENLAFAXINE HCL ER 75 MG PO CP24: 75.0000 mg | ORAL_CAPSULE | Freq: Every day | ORAL | 1 refills | Status: DC

## 2021-05-30 MED ORDER — OXYCODONE-ACETAMINOPHEN 10-325 MG PO TABS: ORAL_TABLET | ORAL | 0 refills | Status: DC

## 2021-05-30 MED ORDER — QUETIAPINE FUMARATE 50 MG PO TABS: 50.0000 mg | ORAL_TABLET | Freq: Every day | ORAL | 1 refills | Status: DC

## 2021-05-30 NOTE — Progress Notes (Signed)
[facility] [place], [place] [address]  Internal MEDICINE  Office Visit Note  Patient Name: Stacy Pineda  [id number]  [id number]  Date of Service: 05/30/2021     Chief Complaint  Patient presents with   Hospitalization Follow-up    Left upper quad/abdominal pain Chronic issues   FMLA paperwork   Depression    Pt would like a referral to psych     HPI Pt is here for recent hospital follow up. Note is copied from [facility]. She continues to have pain, complicated history due to multiple abdominal surgeries. She is c/o depression and not sleeping well   Patient is a 40 year old female with history of chronic pancreatitis. She states over the past 3 to 4 days her abdominal pain has gotten worse. She states that she always has some pain but it has been tolerable. Differential diagnosis includes but not limited to bowel obstruction, pancreatitis, GERD, gastritis, colitis, peptic ulcer disease.   Lipase was 548. Hemoglobin was 9.0. Using shared decision making patient has decided to try IV fluids now, pain medication and will treat as outpatient. Patient was given normal saline 1 L and lactated Ringer's 1 L. She also received morphine 4 mg IV. She states that she always gets itching with all pain medications and always receives Benadryl. Patient was given Benadryl 25 mg IV Depression screen [facility] 2/9 05/30/2021 12/14/2020 10/22/2020 04/17/2020 03/13/2020  Decreased Interest 3 3 1 2 1   Down, Depressed, Hopeless 3 2 0 3 1  PHQ - 2 Score 6 5 1 5 2   Altered sleeping 3 3 - - 3  Tired, decreased energy 3 3 - - 3  Change in appetite 3 3 - - 3  Feeling bad or failure about yourself  3 2 - - 1  Trouble concentrating 3 2 - - 1  Moving slowly or fidgety/restless 3 0 - - 0  Suicidal thoughts 3 0 - - 0  PHQ-9 Score 27 18 - - 13  Difficult doing work/chores Extremely dIfficult Very difficult - - -  Some recent data might be hidden    Current Medication: Outpatient  Encounter Medications as of 05/30/2021  Medication Sig   medroxyPROGESTERone (DEPO-PROVERA) 150 MG/ML injection Inject 1 mL (150 mg total) into the muscle every 3 (three) months.   Multiple Vitamins-Minerals (MULTIVITAMIN ADULT PO) Take 1 tablet by mouth.   pregabalin (LYRICA) 25 MG capsule Take 1 capsule (25 mg total) by mouth 2 (two) times daily.   promethazine (PHENERGAN) 6.25 MG/5ML syrup TAKE  10 ML BY MOUTH EVERY 8 HOURS AS NEEDED FOR NAUSEA AND VOMITING OR  REFRACTORY  NAUSEA/VOMITING   QUEtiapine (SEROQUEL) 50 MG tablet Take 1 tablet (50 mg total) by mouth at bedtime.   venlafaxine XR (EFFEXOR XR) 75 MG 24 hr capsule Take 1 capsule (75 mg total) by mouth daily with breakfast.   [DISCONTINUED] oxyCODONE-acetaminophen (PERCOCET) 10-325 MG tablet Take half to one rab po qd prn for acute and severe pain   oxyCODONE-acetaminophen (PERCOCET) 10-325 MG tablet Take half to one rab po qd prn for acute and severe pain   [DISCONTINUED] clotrimazole-betamethasone (LOTRISONE) cream Apply 1 application topically 2 (two) times daily. (Patient not taking: Reported on 05/30/2021)   [DISCONTINUED] ibuprofen (ADVIL) 600 MG tablet Take one tab po bid for headaches prn only (Patient not taking: Reported on 05/30/2021)   [DISCONTINUED] QUEtiapine (SEROQUEL) 100 MG tablet Take 100 mg by mouth at bedtime. (Patient not taking: Reported on 05/30/2021)  No facility-administered encounter medications on file as of 05/30/2021.    Surgical History: Past Surgical History:  Procedure Laterality Date   ABDOMINAL SURGERY     BREAST BIOPSY Right 04/27/2020   stereo bx, coil clip, path pending    CHOLECYSTECTOMY     COLONOSCOPY WITH PROPOFOL N/A 09/08/2018   Procedure: COLONOSCOPY WITH PROPOFOL;  Surgeon: Virgel Manifold, MD;  Location: [facility] ENDOSCOPY;  Service: Endoscopy;  Laterality: N/A;   DECUBITUS ULCER EXCISION     DILATION AND CURETTAGE OF UTERUS     ESOPHAGOGASTRODUODENOSCOPY (EGD) WITH PROPOFOL N/A  09/08/2018   Procedure: ESOPHAGOGASTRODUODENOSCOPY (EGD) WITH PROPOFOL;  Surgeon: Virgel Manifold, MD;  Location: [facility] ENDOSCOPY;  Service: Endoscopy;  Laterality: N/A;   GASTRIC BYPASS     GASTRIC BYPASS     REVISION GASTRIC RESTRICTIVE PROCEDURE FOR MORBID OBESITY      Medical History: Past Medical History:  Diagnosis Date   Anxiety    Asthma    Bilateral ovarian cysts    Chronic headaches    Depression    Hypothyroidism    Pancreatitis    Pancreatitis    PTSD (post-traumatic stress disorder)     Family History: Family History  Problem Relation Age of Onset   Graves' disease Mother    Hypertension Mother    Hyperlipidemia Mother    Anxiety disorder Father    Prostate cancer Father    Depression Brother    Anxiety disorder Brother    Anxiety disorder Maternal Aunt    Depression Maternal Aunt    Anxiety disorder Paternal Aunt    Depression Paternal Aunt    Breast cancer Paternal Aunt    Anxiety disorder Maternal Uncle    Depression Maternal Uncle    Anxiety disorder Paternal Uncle    Depression Paternal Uncle    Anxiety disorder Maternal Grandfather    Depression Maternal Grandfather    Diabetes Maternal Grandfather    Anxiety disorder Maternal Grandmother    Depression Maternal Grandmother    Breast cancer Maternal Grandmother    Ovarian cancer Maternal Grandmother    Diabetes Maternal Grandmother    Colon cancer Neg Hx     Social History   Socioeconomic History   Marital status: Single    Spouse name: Not on file   Number of children: Not on file   Years of education: Not on file   Highest education level: Not on file  Occupational History   Not on file  Tobacco Use   Smoking status: Some Days    Packs/day: 0.25    Types: Cigarettes    Start date: 03/26/2005   Smokeless tobacco: Never   Tobacco comments:    working on stopping  Vaping Use   Vaping Use: Never used  Substance and Sexual Activity   Alcohol use: No    Alcohol/week: 0.0  standard drinks   Drug use: Yes    Frequency: 3.0 times per week    Types: Marijuana    Comment: MJ use weekly   Sexual activity: Not Currently    Birth control/protection: Injection  Other Topics Concern   Not on file  Social History Narrative   Not on file   Social Determinants of Health   Financial Resource Strain: Not on file  Food Insecurity: Not on file  Transportation Needs: Not on file  Physical Activity: Not on file  Stress: Not on file  Social Connections: Not on file  Intimate Partner Violence: Not on file  Review of Systems  Constitutional:  Negative for chills, fatigue and unexpected weight change.  HENT:  Negative for congestion, postnasal drip, rhinorrhea, sneezing and sore throat.   Eyes:  Negative for redness.  Respiratory:  Negative for cough, chest tightness and shortness of breath.   Cardiovascular:  Negative for chest pain and palpitations.  Gastrointestinal:  Positive for abdominal pain. Negative for constipation, diarrhea, nausea and vomiting.  Genitourinary:  Negative for dysuria and frequency.  Musculoskeletal:  Negative for arthralgias, back pain, joint swelling and neck pain.  Skin:  Negative for rash.  Neurological: Negative.  Negative for tremors and numbness.  Hematological:  Negative for adenopathy. Does not bruise/bleed easily.  Psychiatric/Behavioral:  Negative for behavioral problems (Depression), sleep disturbance and suicidal ideas. The patient is not nervous/anxious.    Vital Signs: BP (!) 130/96   Pulse 86   Temp 98.4 F (36.9 C)   Resp 16   Ht 6' (1.829 m)   Wt 180 lb (81.6 kg)   SpO2 98%   BMI 24.41 kg/m    Physical Exam Constitutional:      Appearance: Normal appearance.  HENT:     Head: Normocephalic and atraumatic.     Nose: Nose normal.     Mouth/Throat:     Mouth: Mucous membranes are moist.     Pharynx: No posterior oropharyngeal erythema.  Eyes:     Extraocular Movements: Extraocular movements intact.      Pupils: Pupils are equal, round, and reactive to light.  Cardiovascular:     Pulses: Normal pulses.     Heart sounds: Normal heart sounds.  Pulmonary:     Effort: Pulmonary effort is normal.     Breath sounds: Normal breath sounds.  Neurological:     General: No focal deficit present.     Mental Status: She is alert.  Psychiatric:        Mood and Affect: Mood normal.        Behavior: Behavior normal.      Assessment/Plan: 1. Chronic pancreatitis, unspecified pancreatitis type ([place]) Pt continues to have episodes of ab pain, nausea and vomiting, pt was found to be anemic, with hg of 7.0 - Ambulatory referral to Gastroenterology  2. Chronic abdominal pain Pt has periods of severe pain, start Lyrica as prescribed today  - oxyCODONE-acetaminophen (PERCOCET) 10-325 MG tablet; Take half to one rab po qd prn for acute and severe pain  Dispense: 30 tablet; Refill: 0 - Ambulatory referral to Gastroenterology  3. MDD (major depressive disorder), recurrent episode, moderate (HCC) Restart Effexor as prescribed, add Seroquel for insomnia  - oxyCODONE-acetaminophen (PERCOCET) 10-325 MG tablet; Take half to one rab po qd prn for acute and severe pain  Dispense: 30 tablet; Refill: 0 - Ambulatory referral to Psychiatry   General Counseling: kensleigh gates understanding of the findings of todays visit and agrees with plan of treatment. I have discussed any further diagnostic evaluation that may be needed or ordered today. We also reviewed her medications today. she has been encouraged to call the office with any questions or concerns that should arise related to todays visit.    Counseling:  [place] Controlled Substance Database was reviewed by me.  Orders Placed This Encounter  Procedures   Ambulatory referral to Psychiatry   Ambulatory referral to Gastroenterology   Meds ordered this encounter  Medications   venlafaxine XR (EFFEXOR XR) 75 MG 24 hr capsule    Sig: Take 1 capsule (75 mg  total) by mouth daily with  breakfast.    Dispense:  90 capsule    Refill:  1   pregabalin (LYRICA) 25 MG capsule    Sig: Take 1 capsule (25 mg total) by mouth 2 (two) times daily.    Dispense:  180 capsule    Refill:  3   oxyCODONE-acetaminophen (PERCOCET) 10-325 MG tablet    Sig: Take half to one rab po qd prn for acute and severe pain    Dispense:  30 tablet    Refill:  0   QUEtiapine (SEROQUEL) 50 MG tablet    Sig: Take 1 tablet (50 mg total) by mouth at bedtime.    Dispense:  90 tablet    Refill:  1       I have reviewed all medical records from hospital follow up including radiology reports and consults from other physicians. Appropriate follow up diagnostics will be scheduled as needed. Patient/ Family understands the plan of treatment. Time spent 30 minutes.   Dr Lavera Guise, MD Internal Medicine

## 2021-06-03 ENCOUNTER — Telehealth: Payer: Self-pay

## 2021-06-03 NOTE — Telephone Encounter (Signed)
Disability paperwork received through fax on 05-30-21. Placed on DFK desk to be completed.

## 2021-06-03 NOTE — Telephone Encounter (Signed)
Short term disability paperwork filled out by provider at patients last appointment and given back to patient. Copy of paperwork placed in scan.

## 2021-06-11 ENCOUNTER — Telehealth: Payer: Self-pay

## 2021-06-11 NOTE — Telephone Encounter (Signed)
Filled out disability forms and were signed by DFK and placed with courtney

## 2021-06-14 ENCOUNTER — Telehealth: Payer: Self-pay

## 2021-06-14 NOTE — Telephone Encounter (Signed)
Patient disability paperwork completed by provider and faxed back to [employer] at [phone]. Copy of paperwork placed in scan.

## 2021-06-28 ENCOUNTER — Ambulatory Visit: Payer: Medicare Other | Admitting: Gastroenterology

## 2021-07-08 ENCOUNTER — Telehealth (HOSPITAL_COMMUNITY): Payer: Self-pay | Admitting: Psychiatry

## 2021-07-11 ENCOUNTER — Telehealth: Payer: Self-pay

## 2021-07-11 ENCOUNTER — Other Ambulatory Visit: Payer: Self-pay | Admitting: Internal Medicine

## 2021-07-11 DIAGNOSIS — R112 Nausea with vomiting, unspecified: Secondary | ICD-10-CM

## 2021-07-11 DIAGNOSIS — G8929 Other chronic pain: Secondary | ICD-10-CM

## 2021-07-11 DIAGNOSIS — F331 Major depressive disorder, recurrent, moderate: Secondary | ICD-10-CM

## 2021-07-11 MED ORDER — OXYCODONE-ACETAMINOPHEN 10-325 MG PO TABS: ORAL_TABLET | ORAL | 0 refills | Status: DC

## 2021-07-11 MED ORDER — PROMETHAZINE HCL 6.25 MG/5ML PO SYRP: ORAL_SOLUTION | ORAL | 2 refills | Status: DC

## 2021-07-11 NOTE — Telephone Encounter (Signed)
sent 

## 2021-07-11 NOTE — Telephone Encounter (Signed)
Disability paperwork given to [facility].

## 2021-07-16 ENCOUNTER — Telehealth: Payer: Self-pay

## 2021-07-16 NOTE — Telephone Encounter (Signed)
[name] to call us back 

## 2021-07-17 ENCOUNTER — Telehealth: Payer: Self-pay

## 2021-07-17 NOTE — Telephone Encounter (Signed)
Referral sent via [facility]

## 2021-07-17 NOTE — Telephone Encounter (Signed)
Spoke to pt about her short term disability paperwork and she advised she isn't sure that we need to complete the paperwork at this time that she needs to see GI before.  Pt has appt with GI on 07/25/21 and in the meantime she will call the [facility] ([employer]) company and check if paperwork needs to be done now or after seeing the GI doctor.  Pt did schedule a f/u appt with [place] after her GI appt so she can discuss the results from GI and returning to work.

## 2021-07-23 NOTE — Telephone Encounter (Signed)
Appointment scheduled for 08/06/21 w/ Dian Situ Henley-Toni

## 2021-07-25 ENCOUNTER — Encounter: Payer: Self-pay | Admitting: Gastroenterology

## 2021-07-25 ENCOUNTER — Ambulatory Visit (INDEPENDENT_AMBULATORY_CARE_PROVIDER_SITE_OTHER): Payer: BC Managed Care – PPO | Admitting: Gastroenterology

## 2021-07-25 VITALS — BP 137/87 | HR 92 | Temp 97.2°F | Ht 73.0 in | Wt 186.4 lb

## 2021-07-25 DIAGNOSIS — R109 Unspecified abdominal pain: Secondary | ICD-10-CM | POA: Diagnosis not present

## 2021-07-25 DIAGNOSIS — D508 Other iron deficiency anemias: Secondary | ICD-10-CM

## 2021-07-25 DIAGNOSIS — K529 Noninfective gastroenteritis and colitis, unspecified: Secondary | ICD-10-CM

## 2021-07-25 DIAGNOSIS — E538 Deficiency of other specified B group vitamins: Secondary | ICD-10-CM

## 2021-07-25 MED ORDER — CLENPIQ 10-3.5-12 MG-GM -GM/160ML PO SOLN: 1.0000 | ORAL | 0 refills | Status: DC

## 2021-07-25 MED ORDER — DICYCLOMINE HCL 20 MG PO TABS: 20.0000 mg | ORAL_TABLET | Freq: Three times a day (TID) | ORAL | 0 refills | Status: DC

## 2021-07-25 NOTE — Progress Notes (Signed)
°  ° R , MD °[address]  °[address]  °[place], [place] [address]  °Main: [phone]  °Fax: [phone] °Pager: [phone] ° ° °Primary Care Physician: McDonough, Lauren K, PA-C ° °Primary Gastroenterologist:  Dr.  R  ° °Chief Complaint  °Patient presents with  ° New Patient (Initial Visit)  ° Abdominal Pain  ° Anemia  ° Bloated  ° ° ° °HPI: Stacy Pineda is a 40 y.o. female with history of laparoscopic reversal of gastric bypass, partial gastrectomy in 2015 secondary to chronic recurrent marginal ulcer, cholecystectomy is referred to me by Dr. Tahiliani to evaluate for hemorrhoid ligation due to intermittent rectal bleeding.  Patient underwent colonoscopy for chronic diarrhea and was found to have internal hemorrhoids.  She had random colon biopsies which were unremarkable for any inflammation.  Patient reports ongoing diarrhea.  She is currently on ciprofloxacin started by her PCP for nausea and vomiting, thought to be viral gastroenteritis.  Patient is more concerned about ongoing diarrhea more than rectal bleeding, has postprandial urgency.  She does have chronic iron deficiency anemia, closely followed by hematology for parenteral iron.  Patient has history of PTSD, anxiety and depression and has been going through a lot of health issues.  Her quality of life is significantly disrupted due to her GI symptoms and hoping to find some answers.  She is currently on Remeron, Klonopin, Effexor managed by her psychiatrist ° °Follow-up visit 07/25/2021 °Patient was lost to follow-up since 01/2019.  Patient is here to discuss about chronic symptoms of upper abdominal discomfort associated with abdominal bloating, postprandial urgency and loose stools.  She does report history of nausea, dry heaving but without any vomiting.  Patient has been gaining weight.  She does have known history of chronic iron deficiency anemia.  Patient denies any weight loss, rectal bleeding.  Patient was  admitted to [facility] in early October 2022 secondary to abdominal pain, nausea, her serum lipase was in 500s, found to have severe iron deficiency anemia with no evidence of active GI bleed.  She was managed as acute on chronic pancreatitis.  Patient also received iron infusions x 2 at [facility].  Her most recent hemoglobin was 7.9 on 05/23/2021, CV 58.7, no evidence of chronic liver disease.  She underwent CT abdomen and pelvis which revealed normal pancreas.  H. pylori stool antigen negative, normal vitamin D, folate, copper and zinc levels.  She denies smoking marijuana but she does have elevated marijuana to fight with the pain.  Her TSH is normal.  She had EGD and colonoscopy in 2020 which was normal.  Gastric emptying study was normal.  Patient also tells me that she tried pancreatic enzymes in the past with not much relief.  She is requesting if she can have some pain medication ° °Current Outpatient Medications  °Medication Sig Dispense Refill  ° dicyclomine (BENTYL) 20 MG tablet Take 1 tablet (20 mg total) by mouth 4 (four) times daily -  before meals and at bedtime. 120 tablet 0  ° medroxyPROGESTERone (DEPO-PROVERA) 150 MG/ML injection Inject 1 mL (150 mg total) into the muscle every 3 (three) months. 1 mL 4  ° Multiple Vitamins-Minerals (MULTIVITAMIN ADULT PO) Take 1 tablet by mouth.    ° oxyCODONE-acetaminophen (PERCOCET) 10-325 MG tablet Take half to one rab po qd prn for acute and severe pain 30 tablet 0  ° pregabalin (LYRICA) 25 MG capsule Take 1 capsule (25 mg total) by mouth 2 (two) times daily. 180 capsule 3  °   promethazine (PHENERGAN) 6.25 MG/5ML syrup TAKE  10 ML BY MOUTH EVERY 8 HOURS AS NEEDED FOR NAUSEA AND VOMITING OR  REFRACTORY  NAUSEA/VOMITING 240 mL 2  ° QUEtiapine (SEROQUEL) 50 MG tablet Take 1 tablet (50 mg total) by mouth at bedtime. 90 tablet 1  ° Sod Picosulfate-Mag Ox-Cit Acd (CLENPIQ) 10-3.5-12 MG-GM -GM/160ML SOLN Take 1 kit by mouth as directed. At 5 PM evening before procedure, drink 1  bottle of Clenpiq, hydrate, drink (5) 8 oz of water. Then do the same thing 5 hours prior to your procedure. 320 mL 0  ° venlafaxine XR (EFFEXOR XR) 75 MG 24 hr capsule Take 1 capsule (75 mg total) by mouth daily with breakfast. 90 capsule 1  ° °No current facility-administered medications for this visit.  ° ° °Allergies as of 07/25/2021 - Review Complete 07/25/2021  °Allergen Reaction Noted  ° Fentanyl Itching 03/27/2015  ° Morphine and related Itching 03/27/2015  ° Vicodin [hydrocodone-acetaminophen] Itching 03/27/2015  ° ° °NSAIDs: None ° °Antiplts/Anticoagulants/Anti thrombotics: None ° °GI procedures:  °EGD and colonoscopy 09/08/2018 °- Normal gastroesophageal junction and esophagus. Biopsied. °- A, history of gastric bypass and reversal with sutures, were found, characterized by no stomal ulceration and °healthy appearing mucosa. °- Normal examined duodenum. ° °- Preparation of the colon was fair. °- Hemorrhoids found on perianal exam. °- The rectum, sigmoid colon, descending colon, transverse colon, ascending colon and cecum are normal. °Biopsied. °- Non-bleeding internal hemorrhoids. ° °DIAGNOSIS:  °A. STOMACH, BODY; COLD BIOPSY:  °- GASTRIC ANTRAL AND OXYNTIC MUCOSA WITH MILD CHRONIC INACTIVE GASTRITIS  °AND FOVEOLAR HYPERPLASIA.  °- NEGATIVE FOR H. PYLORI, DYSPLASIA, AND MALIGNANCY.  ° °B. ESOPHAGUS; COLD BIOPSY:  °- BENIGN SQUAMOUS MUCOSA WITH NO SIGNIFICANT HISTOPATHOLOGIC CHANGE.  °- NO INCREASE IN INTRAEPITHELIAL EOSINOPHILS (LESS THAN 2 PER HPF).  °- NEGATIVE FOR DYSPLASIA AND MALIGNANCY.  ° °C. COLON, RANDOM; COLD BIOPSY:  °- BENIGN COLONIC MUCOSA WITH NO SIGNIFICANT HISTOPATHOLOGIC CHANGE.  °- NEGATIVE FOR FEATURES OF MICROSCOPIC COLITIS, DYSPLASIA, AND  °MALIGNANCY.  ° °ROS: ° °General: Negative for anorexia, weight loss, fever, chills, fatigue, weakness. °ENT: Negative for hoarseness, difficulty swallowing , nasal congestion. °CV: Negative for chest pain, angina, palpitations, dyspnea on exertion,  peripheral edema.  °Respiratory: Negative for dyspnea at rest, dyspnea on exertion, cough, sputum, wheezing.  °GI: See history of present illness. °GU:  Negative for dysuria, hematuria, urinary incontinence, urinary frequency, nocturnal urination.  °Endo: Negative for unusual weight change.  °  °Physical Examination: ° ° BP 137/87 (BP Location: Left Arm, Patient Position: Sitting, Cuff Size: Normal)    Pulse 92    Temp (!) 97.2 °F (36.2 °C) (Temporal)    Ht 6' 1" (1.854 m)    Wt 186 lb 6.4 oz (84.6 kg)    BMI 24.59 kg/m²  ° °General: Well-nourished, well-developed in no acute distress.  °Eyes: No icterus. Conjunctivae pink. °Mouth: Oropharyngeal mucosa moist and pink , no lesions erythema or exudate. °Lungs: Clear to auscultation bilaterally. Non-labored. °Heart: Regular rate and rhythm, no murmurs rubs or gallops.  °Abdomen: Bowel sounds are normal, nontender, nondistended, no hepatosplenomegaly or masses, no hernia , no rebound or guarding.   °Extremities: No lower extremity edema. No clubbing or deformities. °Neuro: Alert and oriented x 3.  Grossly intact. °Skin: Warm and dry, no jaundice.   °Psych: Alert and cooperative, normal mood and affect. ° ° °Imaging Studies: °Reviewed ° °Assessment and Plan:  ° °Stacy Pineda is a 40 y.o. pleasant female history of gastric   bypass, reversal, chronic iron deficiency anemia, upper abdominal discomfort, postprandial urgency, abdominal bloating with intermittent rectal bleeding attributed to internal hemorrhoids.  EGD and colonoscopy were unremarkable ° °Upper abdominal discomfort with postprandial abdominal bloating and urgency °Recommend to check pancreatic fecal elastase levels given suspicion for chronic pancreatitis °Recommend EGD to evaluate for peptic ulcer disease °Trial of Bentyl 20 mg before each meal and at bedtime as needed °If above work-up is negative recommend trial of Xifaxan or Cipro plus Flagyl empirically for bacterial overgrowth ° °History of iron  deficiency anemia and history of peptic ulcer disease °Recommend EGD with gastric and duodenal biopsies °Recommend colonoscopy with TI evaluation, melena biopsies and random colon biopsies °If EGD and colonoscopy are unremarkable, recommend video capsule endoscopy °Recheck CBC, iron panel today, patient received iron infusion at [facility] in 05/2021 ° ° °Follow up in 3 to 4 months ° ° °Dr  , MD ° °

## 2021-07-29 ENCOUNTER — Telehealth: Payer: Self-pay

## 2021-07-29 ENCOUNTER — Ambulatory Visit (INDEPENDENT_AMBULATORY_CARE_PROVIDER_SITE_OTHER): Payer: Medicare Other | Admitting: Physician Assistant

## 2021-07-29 ENCOUNTER — Encounter: Payer: Self-pay | Admitting: Physician Assistant

## 2021-07-29 ENCOUNTER — Other Ambulatory Visit: Payer: Self-pay

## 2021-07-29 DIAGNOSIS — G8929 Other chronic pain: Secondary | ICD-10-CM | POA: Diagnosis not present

## 2021-07-29 DIAGNOSIS — R109 Unspecified abdominal pain: Secondary | ICD-10-CM | POA: Diagnosis not present

## 2021-07-29 DIAGNOSIS — K861 Other chronic pancreatitis: Secondary | ICD-10-CM

## 2021-07-29 DIAGNOSIS — F331 Major depressive disorder, recurrent, moderate: Secondary | ICD-10-CM | POA: Diagnosis not present

## 2021-07-29 MED ORDER — PREGABALIN 50 MG PO CAPS: 50.0000 mg | ORAL_CAPSULE | Freq: Two times a day (BID) | ORAL | 2 refills | Status: DC

## 2021-07-29 NOTE — Telephone Encounter (Signed)
Disability parking placard completed by provider and given to patient at checkout.

## 2021-07-29 NOTE — Progress Notes (Signed)
[facility] [place], [place] [address]  Internal MEDICINE  Office Visit Note  Patient Name: Stacy Pineda  [id number]  [id number]  Date of Service: 07/31/2021  Chief Complaint  Patient presents with   Follow-up   Depression   Anxiety    HPI Pt is here for routine follow up -Saw GI, has plans for EGD and colonoscopy in Jan. Given bentyl and not yet started. Will pick up today -They had discussed possibly getting back on pancreatic enzymes -Does not want to be on narcotics but does have debilitating pain. Will take like a 1/4 tab percocet to take edge off but still painful. -Eating can make it worse but not eating also problematic -Worried a lot and difficulty sleeping. Tried seroquel and not helping much -Seeing psych on the Dec 27th -May try 1.5 tabs seroquel until she sees psych as they may take over medications and make adjustments -Requests that Promethazine be ordered in the 484m bottle next time she needs a refill to help with cost -lyrica not made much difference, will increase to 554mBID and see if this helps -Will send 1 refill of her percocet until she is seen again with the hopes that she will only use 1/2 tab as needed as lyrica is increased. If pain does not get under better control with lyrica increase may have to consider pain management despite patient not wanting to take narcotics if avoidable -Also mentions she doesnt need any paperwork for job right now  -BP is borderline currently and may need to consider adding bp med in future  Current Medication: Outpatient Encounter Medications as of 07/29/2021  Medication Sig   dicyclomine (BENTYL) 20 MG tablet Take 1 tablet (20 mg total) by mouth 4 (four) times daily -  before meals and at bedtime.   medroxyPROGESTERone (DEPO-PROVERA) 150 MG/ML injection Inject 1 mL (150 mg total) into the muscle every 3 (three) months.   Multiple Vitamins-Minerals (MULTIVITAMIN ADULT PO) Take 1 tablet by  mouth.   pregabalin (LYRICA) 50 MG capsule Take 1 capsule (50 mg total) by mouth 2 (two) times daily.   promethazine (PHENERGAN) 6.25 MG/5ML syrup TAKE  10 ML BY MOUTH EVERY 8 HOURS AS NEEDED FOR NAUSEA AND VOMITING OR  REFRACTORY  NAUSEA/VOMITING   QUEtiapine (SEROQUEL) 50 MG tablet Take 1 tablet (50 mg total) by mouth at bedtime.   Sod Picosulfate-Mag Ox-Cit Acd (CLENPIQ) 10-3.5-12 MG-GM -GM/160ML SOLN Take 1 kit by mouth as directed. At 5 PM evening before procedure, drink 1 bottle of Clenpiq, hydrate, drink (5) 8 oz of water. Then do the same thing 5 hours prior to your procedure.   venlafaxine XR (EFFEXOR XR) 75 MG 24 hr capsule Take 1 capsule (75 mg total) by mouth daily with breakfast.   [DISCONTINUED] oxyCODONE-acetaminophen (PERCOCET) 10-325 MG tablet Take half to one rab po qd prn for acute and severe pain   [DISCONTINUED] pregabalin (LYRICA) 25 MG capsule Take 1 capsule (25 mg total) by mouth 2 (two) times daily.   [START ON 08/14/2021] oxyCODONE-acetaminophen (PERCOCET) 10-325 MG tablet Take half to one rab po qd prn for acute and severe pain   No facility-administered encounter medications on file as of 07/29/2021.    Surgical History: Past Surgical History:  Procedure Laterality Date   ABDOMINAL SURGERY     BREAST BIOPSY Right 04/27/2020   stereo bx, coil clip, path pending    CHOLECYSTECTOMY     COLONOSCOPY WITH PROPOFOL N/A 09/08/2018   Procedure:  COLONOSCOPY WITH PROPOFOL;  Surgeon: Virgel Manifold, MD;  Location: [facility] ENDOSCOPY;  Service: Endoscopy;  Laterality: N/A;   DECUBITUS ULCER EXCISION     DILATION AND CURETTAGE OF UTERUS     ESOPHAGOGASTRODUODENOSCOPY (EGD) WITH PROPOFOL N/A 09/08/2018   Procedure: ESOPHAGOGASTRODUODENOSCOPY (EGD) WITH PROPOFOL;  Surgeon: Virgel Manifold, MD;  Location: [facility] ENDOSCOPY;  Service: Endoscopy;  Laterality: N/A;   GASTRIC BYPASS     GASTRIC BYPASS     REVISION GASTRIC RESTRICTIVE PROCEDURE FOR MORBID OBESITY      Medical  History: Past Medical History:  Diagnosis Date   Anxiety    Asthma    Bilateral ovarian cysts    Chronic headaches    Depression    Hypothyroidism    Pancreatitis    Pancreatitis    PTSD (post-traumatic stress disorder)     Family History: Family History  Problem Relation Age of Onset   Graves' disease Mother    Hypertension Mother    Hyperlipidemia Mother    Anxiety disorder Father    Prostate cancer Father    Depression Brother    Anxiety disorder Brother    Anxiety disorder Maternal Aunt    Depression Maternal Aunt    Anxiety disorder Paternal Aunt    Depression Paternal Aunt    Breast cancer Paternal Aunt    Anxiety disorder Maternal Uncle    Depression Maternal Uncle    Anxiety disorder Paternal Uncle    Depression Paternal Uncle    Anxiety disorder Maternal Grandfather    Depression Maternal Grandfather    Diabetes Maternal Grandfather    Anxiety disorder Maternal Grandmother    Depression Maternal Grandmother    Breast cancer Maternal Grandmother    Ovarian cancer Maternal Grandmother    Diabetes Maternal Grandmother    Colon cancer Neg Hx     Social History   Socioeconomic History   Marital status: Single    Spouse name: Not on file   Number of children: Not on file   Years of education: Not on file   Highest education level: Not on file  Occupational History   Not on file  Tobacco Use   Smoking status: Some Days    Packs/day: 0.25    Types: Cigarettes    Start date: 03/26/2005   Smokeless tobacco: Never   Tobacco comments:    working on stopping  Vaping Use   Vaping Use: Never used  Substance and Sexual Activity   Alcohol use: No    Alcohol/week: 0.0 standard drinks   Drug use: Yes    Frequency: 3.0 times per week    Types: Marijuana    Comment: MJ use weekly   Sexual activity: Not Currently    Birth control/protection: Injection  Other Topics Concern   Not on file  Social History Narrative   Not on file   Social Determinants of  Health   Financial Resource Strain: Not on file  Food Insecurity: Not on file  Transportation Needs: Not on file  Physical Activity: Not on file  Stress: Not on file  Social Connections: Not on file  Intimate Partner Violence: Not on file      Review of Systems  Constitutional:  Negative for chills, fatigue and unexpected weight change.  HENT:  Negative for congestion, postnasal drip, rhinorrhea, sneezing and sore throat.   Eyes:  Negative for redness.  Respiratory:  Negative for cough, chest tightness and shortness of breath.   Cardiovascular:  Negative for chest pain and palpitations.  Gastrointestinal:  Positive for abdominal pain. Negative for constipation, diarrhea, nausea and vomiting.  Genitourinary:  Negative for dysuria and frequency.  Musculoskeletal:  Negative for arthralgias, back pain, joint swelling and neck pain.  Skin:  Negative for rash.  Neurological: Negative.  Negative for tremors and numbness.  Hematological:  Negative for adenopathy. Does not bruise/bleed easily.  Psychiatric/Behavioral:  Positive for sleep disturbance. Negative for behavioral problems (Depression) and suicidal ideas. The patient is nervous/anxious.    Vital Signs: BP (!) 132/92 Comment: 139/95   Pulse 95    Temp 98.4 F (36.9 C)    Resp 16    Ht 6' 1" (1.854 m)    Wt 186 lb 12.8 oz (84.7 kg)    SpO2 99%    BMI 24.65 kg/m    Physical Exam Vitals and nursing note reviewed.  Constitutional:      Appearance: Normal appearance.  HENT:     Head: Normocephalic and atraumatic.     Nose: Nose normal.     Mouth/Throat:     Mouth: Mucous membranes are moist.     Pharynx: No posterior oropharyngeal erythema.  Eyes:     Extraocular Movements: Extraocular movements intact.     Pupils: Pupils are equal, round, and reactive to light.  Cardiovascular:     Rate and Rhythm: Normal rate and regular rhythm.     Pulses: Normal pulses.     Heart sounds: Normal heart sounds.  Pulmonary:     Effort:  Pulmonary effort is normal.     Breath sounds: Normal breath sounds.  Musculoskeletal:        General: Normal range of motion.  Skin:    General: Skin is warm and dry.  Neurological:     General: No focal deficit present.     Mental Status: She is alert.  Psychiatric:        Mood and Affect: Mood normal.        Behavior: Behavior normal.       Assessment/Plan: 1. Chronic abdominal pain Will try increasing to 23m BID of lyrica and may continue percocet as needed. Is being followed by GI however they will not treat pain. Advised we may have ot consider pain management referral if not improving. Pt will start on bentyl as prescribed by GI last visit and will have upcoming diagnostics with their office. - pregabalin (LYRICA) 50 MG capsule; Take 1 capsule (50 mg total) by mouth 2 (two) times daily.  Dispense: 60 capsule; Refill: 2 - oxyCODONE-acetaminophen (PERCOCET) 10-325 MG tablet; Take half to one rab po qd prn for acute and severe pain  Dispense: 30 tablet; Refill: 0  2. Chronic pancreatitis, unspecified pancreatitis type ([place] Followed by GI  3. MDD (major depressive disorder), recurrent episode, moderate ([place] May continue effexor and increase seroquel to 1.5tabs (743m before bed until she has appt with psych in 1 week. Advised that psych may adjust meds further at that time.   General Counseling: Daazalyn sliwanderstanding of the findings of todays visit and agrees with plan of treatment. I have discussed any further diagnostic evaluation that may be needed or ordered today. We also reviewed her medications today. she has been encouraged to call the office with any questions or concerns that should arise related to todays visit.    No orders of the defined types were placed in this encounter.   Meds ordered this encounter  Medications   pregabalin (LYRICA) 50 MG capsule    Sig: Take 1 capsule (  50 mg total) by mouth 2 (two) times daily.    Dispense:  60 capsule     Refill:  2   oxyCODONE-acetaminophen (PERCOCET) 10-325 MG tablet    Sig: Take half to one rab po qd prn for acute and severe pain    Dispense:  30 tablet    Refill:  0    This patient was seen by Drema Dallas, PA-C in collaboration with Dr. Clayborn Bigness as a part of collaborative care agreement.   Total time spent:35 Minutes Time spent includes review of chart, medications, test results, and follow up plan with the patient.      Dr Lavera Guise Internal medicine

## 2021-07-30 ENCOUNTER — Telehealth: Payer: Self-pay

## 2021-07-30 NOTE — Telephone Encounter (Signed)
Short term disability paperwork received via fax and placed in Lauren's folder.

## 2021-07-31 ENCOUNTER — Other Ambulatory Visit: Payer: Self-pay

## 2021-07-31 ENCOUNTER — Telehealth: Payer: Self-pay

## 2021-07-31 DIAGNOSIS — F331 Major depressive disorder, recurrent, moderate: Secondary | ICD-10-CM

## 2021-07-31 DIAGNOSIS — G8929 Other chronic pain: Secondary | ICD-10-CM

## 2021-07-31 DIAGNOSIS — R109 Unspecified abdominal pain: Secondary | ICD-10-CM

## 2021-07-31 MED ORDER — OXYCODONE-ACETAMINOPHEN 10-325 MG PO TABS: ORAL_TABLET | ORAL | 0 refills | Status: DC

## 2021-07-31 NOTE — Telephone Encounter (Signed)
Phar called for clarification of direction for percocet its tab

## 2021-08-06 ENCOUNTER — Encounter: Payer: Self-pay | Admitting: Oncology

## 2021-08-06 DIAGNOSIS — F4011 Social phobia, generalized: Secondary | ICD-10-CM | POA: Diagnosis not present

## 2021-08-06 DIAGNOSIS — F331 Major depressive disorder, recurrent, moderate: Secondary | ICD-10-CM | POA: Diagnosis not present

## 2021-08-06 DIAGNOSIS — F4312 Post-traumatic stress disorder, chronic: Secondary | ICD-10-CM | POA: Diagnosis not present

## 2021-08-06 DIAGNOSIS — F4001 Agoraphobia with panic disorder: Secondary | ICD-10-CM | POA: Diagnosis not present

## 2021-08-14 ENCOUNTER — Encounter: Payer: Self-pay | Admitting: Oncology

## 2021-08-15 ENCOUNTER — Encounter: Payer: Self-pay | Admitting: Oncology

## 2021-08-19 ENCOUNTER — Encounter: Payer: Self-pay | Admitting: Gastroenterology

## 2021-08-27 DIAGNOSIS — F331 Major depressive disorder, recurrent, moderate: Secondary | ICD-10-CM | POA: Diagnosis not present

## 2021-08-27 DIAGNOSIS — F4312 Post-traumatic stress disorder, chronic: Secondary | ICD-10-CM | POA: Diagnosis not present

## 2021-08-27 DIAGNOSIS — F4001 Agoraphobia with panic disorder: Secondary | ICD-10-CM | POA: Diagnosis not present

## 2021-08-27 DIAGNOSIS — F4011 Social phobia, generalized: Secondary | ICD-10-CM | POA: Diagnosis not present

## 2021-08-29 ENCOUNTER — Ambulatory Visit: Payer: BC Managed Care – PPO | Admitting: Anesthesiology

## 2021-08-29 ENCOUNTER — Encounter: Payer: Self-pay | Admitting: Gastroenterology

## 2021-08-29 ENCOUNTER — Other Ambulatory Visit: Payer: Self-pay

## 2021-08-29 ENCOUNTER — Ambulatory Visit
Admission: RE | Admit: 2021-08-29 | Discharge: 2021-08-29 | Disposition: A | Discharge status: Home / Self Care | Payer: BC Managed Care – PPO | Source: Ambulatory Visit | Attending: Gastroenterology | Admitting: Gastroenterology

## 2021-08-29 ENCOUNTER — Encounter
Admission: RE | Disposition: A | Discharge status: Home / Self Care | Payer: Self-pay | Source: Ambulatory Visit | Attending: Gastroenterology

## 2021-08-29 DIAGNOSIS — K2210 Ulcer of esophagus without bleeding: Secondary | ICD-10-CM

## 2021-08-29 DIAGNOSIS — T182XXA Foreign body in stomach, initial encounter: Secondary | ICD-10-CM | POA: Insufficient documentation

## 2021-08-29 DIAGNOSIS — D500 Iron deficiency anemia secondary to blood loss (chronic): Secondary | ICD-10-CM | POA: Diagnosis not present

## 2021-08-29 DIAGNOSIS — F17210 Nicotine dependence, cigarettes, uncomplicated: Secondary | ICD-10-CM | POA: Diagnosis not present

## 2021-08-29 DIAGNOSIS — X58XXXA Exposure to other specified factors, initial encounter: Secondary | ICD-10-CM | POA: Diagnosis not present

## 2021-08-29 DIAGNOSIS — E039 Hypothyroidism, unspecified: Secondary | ICD-10-CM | POA: Diagnosis not present

## 2021-08-29 DIAGNOSIS — K2100 Gastro-esophageal reflux disease with esophagitis, without bleeding: Secondary | ICD-10-CM | POA: Diagnosis not present

## 2021-08-29 DIAGNOSIS — R1013 Epigastric pain: Secondary | ICD-10-CM | POA: Diagnosis not present

## 2021-08-29 DIAGNOSIS — J45909 Unspecified asthma, uncomplicated: Secondary | ICD-10-CM | POA: Diagnosis not present

## 2021-08-29 DIAGNOSIS — F32A Depression, unspecified: Secondary | ICD-10-CM | POA: Diagnosis not present

## 2021-08-29 DIAGNOSIS — D508 Other iron deficiency anemias: Secondary | ICD-10-CM

## 2021-08-29 DIAGNOSIS — K644 Residual hemorrhoidal skin tags: Secondary | ICD-10-CM | POA: Diagnosis not present

## 2021-08-29 DIAGNOSIS — Z4802 Encounter for removal of sutures: Secondary | ICD-10-CM

## 2021-08-29 DIAGNOSIS — K259 Gastric ulcer, unspecified as acute or chronic, without hemorrhage or perforation: Secondary | ICD-10-CM

## 2021-08-29 DIAGNOSIS — K529 Noninfective gastroenteritis and colitis, unspecified: Secondary | ICD-10-CM | POA: Diagnosis not present

## 2021-08-29 DIAGNOSIS — K254 Chronic or unspecified gastric ulcer with hemorrhage: Secondary | ICD-10-CM | POA: Diagnosis not present

## 2021-08-29 DIAGNOSIS — Z9884 Bariatric surgery status: Secondary | ICD-10-CM | POA: Insufficient documentation

## 2021-08-29 DIAGNOSIS — K2960 Other gastritis without bleeding: Secondary | ICD-10-CM | POA: Insufficient documentation

## 2021-08-29 DIAGNOSIS — I10 Essential (primary) hypertension: Secondary | ICD-10-CM | POA: Insufficient documentation

## 2021-08-29 DIAGNOSIS — R197 Diarrhea, unspecified: Secondary | ICD-10-CM | POA: Diagnosis not present

## 2021-08-29 DIAGNOSIS — F419 Anxiety disorder, unspecified: Secondary | ICD-10-CM | POA: Diagnosis not present

## 2021-08-29 HISTORY — PX: ESOPHAGOGASTRODUODENOSCOPY (EGD) WITH PROPOFOL: SHX5813

## 2021-08-29 HISTORY — DX: Presence of spectacles and contact lenses: Z97.3

## 2021-08-29 HISTORY — PX: COLONOSCOPY WITH PROPOFOL: SHX5780

## 2021-08-29 LAB — POCT PREGNANCY, URINE: Preg Test, Ur: NEGATIVE

## 2021-08-29 SURGERY — ESOPHAGOGASTRODUODENOSCOPY (EGD) WITH PROPOFOL
Anesthesia: General | Site: Throat

## 2021-08-29 MED ORDER — OMEPRAZOLE 40 MG PO CPDR
40.0000 mg | DELAYED_RELEASE_CAPSULE | Freq: Two times a day (BID) | ORAL | 3 refills | Status: DC
Start: 2021-08-29 — End: 2022-04-10

## 2021-08-29 MED ORDER — LACTATED RINGERS IV SOLN: INTRAVENOUS | Status: DC

## 2021-08-29 MED ORDER — ACETAMINOPHEN 160 MG/5ML PO SOLN: 325.0000 mg | ORAL | Status: DC | PRN

## 2021-08-29 MED ORDER — PROPOFOL 10 MG/ML IV BOLUS
INTRAVENOUS | Status: DC | PRN
  Administered 2021-08-29: 20 mg via INTRAVENOUS
  Administered 2021-08-29 (×6): 30 mg via INTRAVENOUS
  Administered 2021-08-29: 20 mg via INTRAVENOUS
  Administered 2021-08-29 (×2): 30 mg via INTRAVENOUS
  Administered 2021-08-29: 150 mg via INTRAVENOUS
  Administered 2021-08-29 (×4): 30 mg via INTRAVENOUS
  Administered 2021-08-29: 20 mg via INTRAVENOUS
  Administered 2021-08-29: 40 mg via INTRAVENOUS
  Administered 2021-08-29: 30 mg via INTRAVENOUS
  Administered 2021-08-29: 50 mg via INTRAVENOUS
  Administered 2021-08-29: 20 mg via INTRAVENOUS
  Administered 2021-08-29 (×5): 30 mg via INTRAVENOUS
  Administered 2021-08-29: 20 mg via INTRAVENOUS
  Administered 2021-08-29 (×2): 30 mg via INTRAVENOUS
  Administered 2021-08-29: 20 mg via INTRAVENOUS
  Administered 2021-08-29: 30 mg via INTRAVENOUS
  Administered 2021-08-29: 20 mg via INTRAVENOUS
  Administered 2021-08-29: 50 mg via INTRAVENOUS
  Administered 2021-08-29: 30 mg via INTRAVENOUS

## 2021-08-29 MED ORDER — ACETAMINOPHEN 325 MG PO TABS: 325.0000 mg | ORAL_TABLET | ORAL | Status: DC | PRN

## 2021-08-29 MED ORDER — LIDOCAINE HCL (CARDIAC) PF 100 MG/5ML IV SOSY
PREFILLED_SYRINGE | INTRAVENOUS | Status: DC | PRN
  Administered 2021-08-29: 40 mg via INTRAVENOUS

## 2021-08-29 MED ORDER — STERILE WATER FOR IRRIGATION IR SOLN
Status: DC | PRN
  Administered 2021-08-29: 500 mL

## 2021-08-29 MED ORDER — SODIUM CHLORIDE 0.9 % IV SOLN: INTRAVENOUS | Status: DC

## 2021-08-29 MED ORDER — ONDANSETRON HCL 4 MG/2ML IJ SOLN: 4.0000 mg | Freq: Once | INTRAMUSCULAR | Status: DC | PRN

## 2021-08-29 MED ORDER — GLYCOPYRROLATE 0.2 MG/ML IJ SOLN
INTRAMUSCULAR | Status: DC | PRN
  Administered 2021-08-29: .1 mg via INTRAVENOUS

## 2021-08-29 SURGICAL SUPPLY — 12 items
BLOCK BITE 60FR ADLT L/F GRN (MISCELLANEOUS) ×3 IMPLANT
ELECT REM PT RETURN 9FT ADLT (ELECTROSURGICAL) ×3
ELECTRODE REM PT RTRN 9FT ADLT (ELECTROSURGICAL) IMPLANT
FORCEPS BIOP RAD 4 LRG CAP 4 (CUTTING FORCEPS) ×1 IMPLANT
GOWN CVR UNV OPN BCK APRN NK (MISCELLANEOUS) ×4 IMPLANT
GOWN ISOL THUMB LOOP REG UNIV (MISCELLANEOUS) ×6
KIT PRC NS LF DISP ENDO (KITS) ×4 IMPLANT
KIT PROCEDURE OLYMPUS (KITS) ×6
MANIFOLD NEPTUNE II (INSTRUMENTS) ×3 IMPLANT
PROBE INJECTION GOLD (MISCELLANEOUS) ×3
PROBE INJECTION GOLD 7FR (MISCELLANEOUS) IMPLANT
WATER STERILE IRR 250ML POUR (IV SOLUTION) ×6 IMPLANT

## 2021-08-29 NOTE — H&P (Signed)
Stacy Darby, MD [address]  [place]ined[place]  Stacy Pineda, Stacy Pined[place] [id number]  Main: [phone]  Fax: [phone] Pager: [phone]  Primary Care Physician:  Mylinda Latina, PA-C Primary Gastroenterologist:  Dr. Cephas Pineda  Pre-Procedure History & Physical: HPI:  Stacy Pineda is a 41 y.o. female is here for an endoscopy and colonoscopy.   Past Medical History:  Diagnosis Date   Anxiety    Asthma    Bilateral ovarian cysts    Chronic headaches    migraines about 1x/month   Depression    DVT (deep venous thrombosis) ([place]) 07/2014   right upper extremity   Hypothyroidism    Pancreatitis    Pancreatitis    PTSD (post-traumatic stress disorder)    Wears contact lenses     Past Surgical History:  Procedure Laterality Date   ABDOMINAL SURGERY     BREAST BIOPSY Right 04/27/2020   stereo bx, coil clip, path pending    CHOLECYSTECTOMY     COLONOSCOPY WITH PROPOFOL N/A 09/08/2018   Procedure: COLONOSCOPY WITH PROPOFOL;  Surgeon: Stacy Manifold, MD;  Location: [facility] ENDOSCOPY;  Service: Endoscopy;  Laterality: N/A;   DECUBITUS ULCER EXCISION     DILATION AND CURETTAGE OF UTERUS     ESOPHAGOGASTRODUODENOSCOPY (EGD) WITH PROPOFOL N/A 09/08/2018   Procedure: ESOPHAGOGASTRODUODENOSCOPY (EGD) WITH PROPOFOL;  Surgeon: Stacy Manifold, MD;  Location: [facility] ENDOSCOPY;  Service: Endoscopy;  Laterality: N/A;   GASTRIC BYPASS     GASTRIC BYPASS     REVISION GASTRIC RESTRICTIVE PROCEDURE FOR MORBID OBESITY      Prior to Admission medications   Medication Sig Start Date End Date Taking? Authorizing Provider  dicyclomine (BENTYL) 20 MG tablet Take 1 tablet (20 mg total) by mouth 4 (four) times daily -  before meals and at bedtime. 07/25/21 08/29/21 Yes Stacy Pineda, Tally Due, MD  DULoxetine (CYMBALTA) 30 MG capsule Take 60 mg by mouth daily.   Yes [provider]  medroxyPROGESTERone (DEPO-PROVERA) 150 MG/ML injection Inject 1 mL (150 mg total) into the muscle  every 3 (three) months. 12/14/20  Yes Lawhorn, Stacy Pineda, CNM  Multiple Vitamins-Minerals (MULTIVITAMIN ADULT PO) Take 1 tablet by mouth.   Yes [provider]  oxyCODONE-acetaminophen (PERCOCET) 10-325 MG tablet Take half to one rab po qd prn for acute and severe pain Patient taking differently: Take half to one tab po qd prn for acute and severe pain 08/14/21  Yes Pineda, Stacy K, PA-C  pregabalin (LYRICA) 50 MG capsule Take 1 capsule (50 mg total) by mouth 2 (two) times daily. 07/29/21  Yes Pineda, Stacy K, PA-C  promethazine (PHENERGAN) 6.25 MG/5ML syrup TAKE  10 ML BY MOUTH EVERY 8 HOURS AS NEEDED FOR NAUSEA AND VOMITING OR  REFRACTORY  NAUSEA/VOMITING 07/11/21  Yes Stacy Guise, MD  QUEtiapine (SEROQUEL) 50 MG tablet Take 1 tablet (50 mg total) by mouth at bedtime. 05/30/21  Yes Stacy Guise, MD  Sod Picosulfate-Mag Ox-Cit Acd [facility]) 10-3.5-12 MG-GM -GM/160ML SOLN Take 1 kit by mouth as directed. At 5 PM evening before procedure, drink 1 bottle of Clenpiq, hydrate, drink (5) 8 oz of water. Then do the same thing 5 hours prior to your procedure. 07/25/21   Stacy Landsman, MD  venlafaxine XR (EFFEXOR XR) 75 MG 24 hr capsule Take 1 capsule (75 mg total) by mouth daily with breakfast. Patient not taking: Reported on 08/19/2021 05/30/21   Stacy Guise, MD    Allergies as of 07/25/2021 -  Review Complete 07/25/2021  Allergen Reaction Noted   Fentanyl Itching 03/27/2015   Morphine and related Itching 03/27/2015   Vicodin [hydrocodone-acetaminophen] Itching 03/27/2015    Family History  Problem Relation Age of Onset   Graves' disease Mother    Hypertension Mother    Hyperlipidemia Mother    Anxiety disorder Father    Prostate cancer Father    Depression Brother    Anxiety disorder Brother    Anxiety disorder Maternal Aunt    Depression Maternal Aunt    Anxiety disorder Paternal Aunt    Depression Paternal Aunt    Breast cancer Paternal Aunt    Anxiety  disorder Maternal Uncle    Depression Maternal Uncle    Anxiety disorder Paternal Uncle    Depression Paternal Uncle    Anxiety disorder Maternal Grandfather    Depression Maternal Grandfather    Diabetes Maternal Grandfather    Anxiety disorder Maternal Grandmother    Depression Maternal Grandmother    Breast cancer Maternal Grandmother    Ovarian cancer Maternal Grandmother    Diabetes Maternal Grandmother    Colon cancer Neg Hx     Social History   Socioeconomic History   Marital status: Single    Spouse name: Not on file   Number of children: Not on file   Years of education: Not on file   Highest education level: Not on file  Occupational History   Not on file  Tobacco Use   Smoking status: Some Days    Packs/day: 0.25    Types: Cigarettes    Start date: 03/26/2005   Smokeless tobacco: Never   Tobacco comments:    working on stopping  Vaping Use   Vaping Use: Never used  Substance and Sexual Activity   Alcohol use: No    Alcohol/week: 0.0 standard drinks   Drug use: Yes    Frequency: 3.0 times per week    Types: Marijuana    Comment: MJ use weekly   Sexual activity: Not Currently    Birth control/protection: Injection  Other Topics Concern   Not on file  Social History Narrative   Not on file   Social Determinants of Health   Financial Resource Strain: Not on file  Food Insecurity: Not on file  Transportation Needs: Not on file  Physical Activity: Not on file  Stress: Not on file  Social Connections: Not on file  Intimate Partner Violence: Not on file    Review of Systems: See HPI, otherwise negative ROS  Physical Exam: BP 131/88    Pulse 79    Temp 98.3 F (36.8 C) (Temporal)    Ht '6\' 1"'  (1.854 m)    Wt 83.6 kg    SpO2 98%    BMI 24.33 kg/m  General:   Alert,  pleasant and cooperative in NAD Head:  Normocephalic and atraumatic. Neck:  Supple; no masses or thyromegaly. Lungs:  Clear throughout to auscultation.    Heart:  Regular rate and  rhythm. Abdomen:  Soft, nontender and nondistended. Normal bowel sounds, without guarding, and without rebound.   Neurologic:  Alert and  oriented x4;  grossly normal neurologically.  Impression/Plan: Stacy Pineda is here for an endoscopy and colonoscopy to be performed for IDA, upper abdominal discomfort, postprandial urgency, abdominal bloating with intermittent rectal bleeding   Risks, benefits, limitations, and alternatives regarding  endoscopy and colonoscopy have been reviewed with the patient.  Questions have been answered.  All parties agreeable.   Sherri Sear, MD  08/29/2021, 9:16 AM

## 2021-08-29 NOTE — Op Note (Signed)
[facility] Gastroenterology Patient Name: Stacy Pineda Procedure Date: 08/29/2021 9:54 AM MRN: [id number] Account #: [id number] Date of Birth: 12/16/1980 Admit Type: Outpatient Age: 41 Room: [facility] OR ROOM 01 Gender: Female Note Status: Finalized Instrument Name: [id number] Procedure:             Upper GI endoscopy Indications:           Iron deficiency anemia secondary to chronic blood                         loss, Dyspepsia, Diarrhea Providers:             Lin Landsman MD, MD Medicines:             General Anesthesia Complications:         No immediate complications. Estimated blood loss: None. Procedure:             Pre-Anesthesia Assessment:                        - Prior to the procedure, a History and Physical was                         performed, and patient medications and allergies were                         reviewed. The patient is competent. The risks and                         benefits of the procedure and the sedation options and                         risks were discussed with the patient. All questions                         were answered and informed consent was obtained.                         Patient identification and proposed procedure were                         verified by the physician, the nurse, the                         anesthesiologist, the anesthetist and the technician                         in the pre-procedure area in the procedure room in the                         endoscopy suite. Mental Status Examination: alert and                         oriented. Airway Examination: normal oropharyngeal                         airway and neck mobility. Respiratory Examination:                         clear to auscultation.  CV Examination: normal.                         Prophylactic Antibiotics: The patient does not require                         prophylactic antibiotics. Prior Anticoagulants: The                         patient  has taken no previous anticoagulant or                         antiplatelet agents. ASA Grade Assessment: II - A                         patient with mild systemic disease. After reviewing                         the risks and benefits, the patient was deemed in                         satisfactory condition to undergo the procedure. The                         anesthesia plan was to use general anesthesia.                         Immediately prior to administration of medications,                         the patient was re-assessed for adequacy to receive                         sedatives. The heart rate, respiratory rate, oxygen                         saturations, blood pressure, adequacy of pulmonary                         ventilation, and response to care were monitored                         throughout the procedure. The physical status of the                         patient was re-assessed after the procedure.                        After obtaining informed consent, the endoscope was                         passed under direct vision. Throughout the procedure,                         the patient's blood pressure, pulse, and oxygen                         saturations were monitored continuously. The Endoscope  was introduced through the mouth, and advanced to the                         third part of duodenum. The upper GI endoscopy was                         accomplished without difficulty. The patient tolerated                         the procedure well. Findings:      The duodenal bulb, second portion of the duodenum and third portion of       the duodenum were normal. Biopsies were taken with a cold forceps for       histology.      One oozing superficial gastric ulcer with oozing hemorrhage ([place]       Class Ib) was found at the pylorus. The lesion was 5 mm in largest       dimension. Fulguration to ablate the lesion by monopolar probe was        successful. Estimated blood loss: none.      Evidence of a gastric bypass reversal was found in the gastric body.       This was characterized by healthy appearing mucosa. Biopsies were taken       with a cold forceps for Helicobacter pylori testing.      LA Grade B (one or more mucosal breaks greater than 5 mm, not extending       between the tops of two mucosal folds) esophagitis with no bleeding was       found at the gastroesophageal junction.      A staple was found at the anastomosis. Removal was accomplished with a       regular forceps. Estimated blood loss: none. Impression:            - Normal duodenal bulb, second portion of the duodenum                         and third portion of the duodenum. Biopsied.                        - Oozing gastric ulcer with oozing hemorrhage ([place]                         Class Ib). Treated with a monopolar probe.                        - A gastric bypass was found, characterized by healthy                         appearing mucosa. Biopsied.                        - LA Grade B reflux esophagitis with no bleeding.                        - A staple was found in the stomach. Removal was                         successful. Recommendation:        -  Await pathology results.                        - Follow an antireflux regimen indefinitely.                        - Use Prilosec (omeprazole) 40 mg PO BID for 3 months.                        - Return to my office as previously scheduled.                        - Proceed with colonoscopy as scheduled                        See colonoscopy report Procedure Code(s):     --- Professional ---                        32440, 92, Esophagogastroduodenoscopy, flexible,                         transoral; with control of bleeding, any method                        43247, Esophagogastroduodenoscopy, flexible,                         transoral; with removal of foreign body(s)                        43239,  Esophagogastroduodenoscopy, flexible,                         transoral; with biopsy, single or multiple Diagnosis Code(s):     --- Professional ---                        K25.4, Chronic or unspecified gastric ulcer with                         hemorrhage                        Z98.84, Bariatric surgery status                        K21.00, Gastro-esophageal reflux disease with                         esophagitis, without bleeding                        T18.2XXA, Foreign body in stomach, initial encounter                        D50.0, Iron deficiency anemia secondary to blood loss                         (chronic)                        R10.13, Epigastric pain  R19.7, Diarrhea, unspecified CPT copyright 2019 [employer]. All rights reserved. The codes documented in this report are preliminary and upon coder review may  be revised to meet current compliance requirements. Dr. Ulyess Mort Lin Landsman MD, MD 08/29/2021 10:33:20 AM This report has been signed electronically. Number of Addenda: 0 Note Initiated On: 08/29/2021 9:54 AM Total Procedure Duration: 0 hours 24 minutes 8 seconds  Estimated Blood Loss:  Estimated blood loss: none. Estimated blood loss: none.      [facility]

## 2021-08-29 NOTE — Anesthesia Procedure Notes (Signed)
Date/Time: 08/29/2021 10:26 AM Performed by: Cameron Ali, CRNA Pre-anesthesia Checklist: Patient identified, Emergency Drugs available, Suction available, Timeout performed and Patient being monitored Patient Re-evaluated:Patient Re-evaluated prior to induction Oxygen Delivery Method: Nasal cannula Placement Confirmation: positive ETCO2

## 2021-08-29 NOTE — Transfer of Care (Signed)
Immediate Anesthesia Transfer of Care Note  Patient: Stacy Pineda  Procedure(s) Performed: ESOPHAGOGASTRODUODENOSCOPY (EGD) WITH PROPOFOL (Throat) COLONOSCOPY WITH PROPOFOL (Rectum)  Patient Location: PACU  Anesthesia Type: General  Level of Consciousness: awake, alert  and patient cooperative  Airway and Oxygen Therapy: Patient Spontanous Breathing and Patient connected to supplemental oxygen  Post-op Assessment: Post-op Vital signs reviewed, Patient's Cardiovascular Status Stable, Respiratory Function Stable, Patent Airway and No signs of Nausea or vomiting  Post-op Vital Signs: Reviewed and stable  Complications: No notable events documented.

## 2021-08-29 NOTE — Op Note (Signed)
[facility] Gastroenterology Patient Name: Stacy Pineda Procedure Date: 08/29/2021 9:53 AM MRN: [id number] Account #: [id number] Date of Birth: Sep 01, 1980 Admit Type: Outpatient Age: 41 Room: [facility] OR ROOM 1 Gender: Female Note Status: Finalized Instrument Name: [id number] Procedure:             Colonoscopy Indications:           Last colonoscopy: January 2020, Clinically significant                         diarrhea of unexplained origin Providers:             Lin Landsman MD, MD Medicines:             General Anesthesia Complications:         No immediate complications. Estimated blood loss: None. Procedure:             Pre-Anesthesia Assessment:                        - Prior to the procedure, a History and Physical was                         performed, and patient medications and allergies were                         reviewed. The patient is competent. The risks and                         benefits of the procedure and the sedation options and                         risks were discussed with the patient. All questions                         were answered and informed consent was obtained.                         Patient identification and proposed procedure were                         verified by the physician, the nurse, the                         anesthesiologist, the anesthetist and the technician                         in the pre-procedure area in the procedure room in the                         endoscopy suite. Mental Status Examination: alert and                         oriented. Airway Examination: normal oropharyngeal                         airway and neck mobility. Respiratory Examination:                         clear to auscultation. CV Examination:  normal.                         Prophylactic Antibiotics: The patient does not require                         prophylactic antibiotics. Prior Anticoagulants: The                         patient  has taken no previous anticoagulant or                         antiplatelet agents. ASA Grade Assessment: II - A                         patient with mild systemic disease. After reviewing                         the risks and benefits, the patient was deemed in                         satisfactory condition to undergo the procedure. The                         anesthesia plan was to use general anesthesia.                         Immediately prior to administration of medications,                         the patient was re-assessed for adequacy to receive                         sedatives. The heart rate, respiratory rate, oxygen                         saturations, blood pressure, adequacy of pulmonary                         ventilation, and response to care were monitored                         throughout the procedure. The physical status of the                         patient was re-assessed after the procedure.                        After obtaining informed consent, the colonoscope was                         passed under direct vision. Throughout the procedure,                         the patient's blood pressure, pulse, and oxygen                         saturations were monitored continuously. The  Colonoscope was introduced through the anus and                         advanced to the the terminal ileum, with                         identification of the appendiceal orifice and IC                         valve. The colonoscopy was performed with moderate                         difficulty due to inadequate bowel prep, significant                         looping and the patient's body habitus. Successful                         completion of the procedure was aided by applying                         abdominal pressure and lavage. The patient tolerated                         the procedure well. The quality of the bowel                         preparation  was poor. Findings:      The perianal and digital rectal examinations were normal. Pertinent       negatives include normal sphincter tone and no palpable rectal lesions.      Skin tags were found on perianal exam.      The terminal ileum appeared normal.      Normal mucosa was found in the entire colon. Biopsies were taken with a       cold forceps for histology.      Copious quantities of liquid stool was found in the entire colon,       precluding visualization.      The retroflexed view of the distal rectum and anal verge was normal and       showed no anal or rectal abnormalities. Impression:            - Preparation of the colon was poor.                        - Perianal skin tags found on perianal exam.                        - The examined portion of the ileum was normal.                        - Normal mucosa in the entire examined colon. Biopsied.                        - Stool in the entire examined colon.                        - The distal rectum and anal verge are normal on  retroflexion view. Recommendation:        - Discharge patient to home (with escort).                        - Resume previous diet today.                        - Continue present medications.                        - Await pathology results.                        - Return to my office as previously scheduled. Procedure Code(s):     --- Professional ---                        [phone], Colonoscopy, flexible; with biopsy, single or                         multiple Diagnosis Code(s):     --- Professional ---                        K64.4, Residual hemorrhoidal skin tags                        R19.7, Diarrhea, unspecified CPT copyright 2019 American Medical Association. All rights reserved. The codes documented in this report are preliminary and upon coder review may  be revised to meet current compliance requirements. Dr. Ulyess Mort Lin Landsman MD, MD 08/29/2021 10:54:55  AM This report has been signed electronically. Number of Addenda: 0 Note Initiated On: 08/29/2021 9:53 AM Scope Withdrawal Time: 0 hours 9 minutes 51 seconds  Total Procedure Duration: 0 hours 16 minutes 35 seconds  Estimated Blood Loss:  Estimated blood loss: none.      [facility]

## 2021-08-29 NOTE — Anesthesia Preprocedure Evaluation (Addendum)
Anesthesia Evaluation  Patient identified by MRN, date of birth, ID band Patient awake    Reviewed: Allergy & Precautions, NPO status   Airway Mallampati: II  TM Distance: >3 FB     Dental   Pulmonary asthma , Current Smoker and Patient abstained from smoking.,    Pulmonary exam normal        Cardiovascular hypertension,  Rhythm:Regular Rate:Normal     Neuro/Psych  Headaches, PSYCHIATRIC DISORDERS Anxiety Depression    GI/Hepatic   Endo/Other  Hypothyroidism   Renal/GU      Musculoskeletal   Abdominal   Peds  Hematology  (+) anemia ,   Anesthesia Other Findings   Reproductive/Obstetrics                            Anesthesia Physical Anesthesia Plan  ASA: 2  Anesthesia Plan: General   Post-op Pain Management:    Induction: Intravenous  PONV Risk Score and Plan: Propofol infusion, TIVA and Treatment may vary due to age or medical condition  Airway Management Planned: Natural Airway and Nasal Cannula  Additional Equipment:   Intra-op Plan:   Post-operative Plan:   Informed Consent: I have reviewed the patients History and Physical, chart, labs and discussed the procedure including the risks, benefits and alternatives for the proposed anesthesia with the patient or authorized representative who has indicated his/her understanding and acceptance.       Plan Discussed with: CRNA  Anesthesia Plan Comments:        Anesthesia Quick Evaluation

## 2021-08-29 NOTE — Anesthesia Postprocedure Evaluation (Signed)
Anesthesia Post Note  Patient: Stacy Pineda  Procedure(s) Performed: ESOPHAGOGASTRODUODENOSCOPY (EGD) WITH PROPOFOL (Throat) COLONOSCOPY WITH PROPOFOL (Rectum)     Patient location during evaluation: PACU Anesthesia Type: General Level of consciousness: awake Pain management: pain level controlled Vital Signs Assessment: post-procedure vital signs reviewed and stable Respiratory status: respiratory function stable Cardiovascular status: stable Postop Assessment: no signs of nausea or vomiting Anesthetic complications: no   No notable events documented.  Veda Canning

## 2021-08-30 ENCOUNTER — Encounter: Payer: Self-pay | Admitting: Gastroenterology

## 2021-08-30 LAB — SURGICAL PATHOLOGY

## 2021-09-02 ENCOUNTER — Telehealth: Payer: Self-pay

## 2021-09-02 DIAGNOSIS — D508 Other iron deficiency anemias: Secondary | ICD-10-CM | POA: Diagnosis not present

## 2021-09-02 DIAGNOSIS — K529 Noninfective gastroenteritis and colitis, unspecified: Secondary | ICD-10-CM | POA: Diagnosis not present

## 2021-09-02 DIAGNOSIS — E538 Deficiency of other specified B group vitamins: Secondary | ICD-10-CM | POA: Diagnosis not present

## 2021-09-02 NOTE — Telephone Encounter (Signed)
We received short term disability forms for this patient off the fax. Called patient to find out more information why she is needing short term. Do you know anything why she is requesting this?

## 2021-09-02 NOTE — Telephone Encounter (Signed)
Sent patient a mychart message.

## 2021-09-02 NOTE — Telephone Encounter (Signed)
She does have chronic GI symptoms.  I am not sure why she wants a short-term disability  RV

## 2021-09-03 ENCOUNTER — Telehealth: Payer: Self-pay

## 2021-09-03 LAB — COMPREHENSIVE METABOLIC PANEL
ALT: 25 IU/L (ref 0–32)
AST: 23 IU/L (ref 0–40)
Albumin/Globulin Ratio: 1.8 (ref 1.2–2.2)
Albumin: 4.5 g/dL (ref 3.8–4.8)
Alkaline Phosphatase: 145 IU/L — ABNORMAL HIGH (ref 44–121)
BUN/Creatinine Ratio: 16 (ref 9–23)
BUN: 10 mg/dL (ref 6–24)
Bilirubin Total: 0.4 mg/dL (ref 0.0–1.2)
CO2: 21 mmol/L (ref 20–29)
Calcium: 9.4 mg/dL (ref 8.7–10.2)
Chloride: 103 mmol/L (ref 96–106)
Creatinine, Ser: 0.63 mg/dL (ref 0.57–1.00)
Globulin, Total: 2.5 g/dL (ref 1.5–4.5)
Glucose: 86 mg/dL (ref 70–99)
Potassium: 4.6 mmol/L (ref 3.5–5.2)
Sodium: 141 mmol/L (ref 134–144)
Total Protein: 7 g/dL (ref 6.0–8.5)
eGFR: 113 mL/min/{1.73_m2} (ref >59)

## 2021-09-03 LAB — CBC
Hematocrit: 39.3 % (ref 34.0–46.6)
Hemoglobin: 12.3 g/dL (ref 11.1–15.9)
MCH: 23.4 pg — ABNORMAL LOW (ref 26.6–33.0)
MCHC: 31.3 g/dL — ABNORMAL LOW (ref 31.5–35.7)
MCV: 75 fL — ABNORMAL LOW (ref 79–97)
Platelets: 271 10*3/uL (ref 150–450)
RBC: 5.26 x10E6/uL (ref 3.77–5.28)
RDW: 17.5 % — ABNORMAL HIGH (ref 11.7–15.4)
WBC: 8.1 10*3/uL (ref 3.4–10.8)

## 2021-09-03 LAB — IRON,TIBC AND FERRITIN PANEL
Ferritin: 16 ng/mL (ref 15–150)
Iron Saturation: 12 % — ABNORMAL LOW (ref 15–55)
Iron: 53 ug/dL (ref 27–159)
Total Iron Binding Capacity: 450 ug/dL (ref 250–450)
UIBC: 397 ug/dL (ref 131–425)

## 2021-09-03 LAB — B12 AND FOLATE PANEL
Folate: 2.2 ng/mL — ABNORMAL LOW (ref >3.0)
Vitamin B-12: 162 pg/mL — ABNORMAL LOW (ref 232–1245)

## 2021-09-03 MED ORDER — CYANOCOBALAMIN 1000 MCG/ML IJ SOLN: INTRAMUSCULAR | 6 refills | Status: DC

## 2021-09-03 MED ORDER — FOLIC ACID 1 MG PO TABS: 1.0000 mg | ORAL_TABLET | Freq: Every day | ORAL | 5 refills | Status: DC

## 2021-09-03 NOTE — Telephone Encounter (Signed)
Sent mediations to the pharmacy. Called and left a message sent mychart message

## 2021-09-03 NOTE — Telephone Encounter (Signed)
-----   Message from Lin Landsman, MD sent at 09/03/2021  8:23 AM EST ----- She has severe severe S92 and folic acid deficiency. Recommend B12 injections 1060mcg once weekly for 4 weeks then monthly. Also, folic acid 1mg  daily   RV

## 2021-09-04 DIAGNOSIS — K529 Noninfective gastroenteritis and colitis, unspecified: Secondary | ICD-10-CM | POA: Diagnosis not present

## 2021-09-06 ENCOUNTER — Telehealth: Payer: Self-pay

## 2021-09-06 LAB — PANCREATIC ELASTASE, FECAL: Pancreatic Elastase, Fecal: <50 ug Elast./g — ABNORMAL LOW (ref >200)

## 2021-09-06 NOTE — Telephone Encounter (Signed)
Faxed requesting records to [employer] at [place] at [phone].

## 2021-09-08 ENCOUNTER — Encounter: Payer: Self-pay | Admitting: Gastroenterology

## 2021-09-09 ENCOUNTER — Ambulatory Visit (INDEPENDENT_AMBULATORY_CARE_PROVIDER_SITE_OTHER): Payer: Medicare Other | Admitting: Physician Assistant

## 2021-09-09 ENCOUNTER — Telehealth: Payer: Self-pay

## 2021-09-09 ENCOUNTER — Encounter: Payer: Self-pay | Admitting: Physician Assistant

## 2021-09-09 ENCOUNTER — Other Ambulatory Visit: Payer: Self-pay

## 2021-09-09 DIAGNOSIS — K8689 Other specified diseases of pancreas: Secondary | ICD-10-CM

## 2021-09-09 DIAGNOSIS — R109 Unspecified abdominal pain: Secondary | ICD-10-CM

## 2021-09-09 DIAGNOSIS — D509 Iron deficiency anemia, unspecified: Secondary | ICD-10-CM

## 2021-09-09 DIAGNOSIS — F331 Major depressive disorder, recurrent, moderate: Secondary | ICD-10-CM | POA: Diagnosis not present

## 2021-09-09 DIAGNOSIS — E782 Mixed hyperlipidemia: Secondary | ICD-10-CM

## 2021-09-09 DIAGNOSIS — R5383 Other fatigue: Secondary | ICD-10-CM

## 2021-09-09 DIAGNOSIS — G8929 Other chronic pain: Secondary | ICD-10-CM

## 2021-09-09 MED ORDER — PANCRELIPASE (LIP-PROT-AMYL) 36000-114000 UNITS PO CPEP: ORAL_CAPSULE | ORAL | 5 refills | Status: DC

## 2021-09-09 MED ORDER — OXYCODONE-ACETAMINOPHEN 10-325 MG PO TABS: ORAL_TABLET | ORAL | 0 refills | Status: DC

## 2021-09-09 NOTE — Telephone Encounter (Signed)
Called and left a message for call back sent referral to hematology. Sent creon to the pharmacy

## 2021-09-09 NOTE — Telephone Encounter (Signed)
-----   Message from Lin Landsman, MD sent at 09/08/2021  1:40 PM EST ----- Recommend referral to hematology for severe iron deficiency anemia and parenteral iron therapy She also has severe EPI.  Recommend Creon or Zenpep 2 capsules with each meal and 1 with snack  RV

## 2021-09-09 NOTE — Progress Notes (Signed)
[facility] [place], [place] [address]  Internal MEDICINE  Office Visit Note  Patient Name: Stacy Pineda  [id number]  [id number]  Date of Service: 09/11/2021  Chief Complaint  Patient presents with   Follow-up   Depression    HPI Pt is here for routine follow up -she reports she had a Bleeding ulcer that had to be cauterized -also was told she has severe pancreatic insufficiency and will be starting on enzymes. Was just given these results this morning so has not started yet  -Also low B12 and was sent injections, also given folic acid supplement. States she was sent vials for b12 but no needles or instructions and is unclear on what she is supposed to do and is therefore going to contact GI office who prescribed them for clarification. -Was also referred to hematology for IDA -Given bentyl and this combination with 1/2 percocet and lyrica is controlling pain enough to function, pain is still there but manageable -Does not want to become addicted to pain meds and really hopes to be able to manage without percocet however still feels she needs some at this time. Hopeful to taper down once pancreatic enzymes on board as this may help abdominal discomfort -not ready to get back to work while still making med adjustments -Working on smoking cessation, maybe smokes 3 days out of a week. -Psych gave her 60mg  cymbalta and started her on minipress (2 at night), next appt on the 17th. Was told to take seroquel if needed. -BP elevated--will start checking at home   Current Medication: Outpatient Encounter Medications as of 09/09/2021  Medication Sig   cyanocobalamin (,VITAMIN B-12,) 1000 MCG/ML injection Inject 1ML every week for 4 weeks then go to once a month   DULoxetine (CYMBALTA) 30 MG capsule Take 60 mg by mouth daily.   folic acid (FOLVITE) 1 MG tablet Take 1 tablet (1 mg total) by mouth daily.   lipase/protease/amylase (CREON) 36000 UNITS CPEP capsule Take 2  capsules with the first bite of each meal and 1 capsule with the first bite of each snack   medroxyPROGESTERone (DEPO-PROVERA) 150 MG/ML injection Inject 1 mL (150 mg total) into the muscle every 3 (three) months.   Multiple Vitamins-Minerals (MULTIVITAMIN ADULT PO) Take 1 tablet by mouth.   omeprazole (PRILOSEC) 40 MG capsule Take 1 capsule (40 mg total) by mouth 2 (two) times daily before a meal.   pregabalin (LYRICA) 50 MG capsule Take 1 capsule (50 mg total) by mouth 2 (two) times daily.   promethazine (PHENERGAN) 6.25 MG/5ML syrup TAKE  10 ML BY MOUTH EVERY 8 HOURS AS NEEDED FOR NAUSEA AND VOMITING OR  REFRACTORY  NAUSEA/VOMITING   QUEtiapine (SEROQUEL) 50 MG tablet Take 1 tablet (50 mg total) by mouth at bedtime.   [DISCONTINUED] oxyCODONE-acetaminophen (PERCOCET) 10-325 MG tablet Take half to one rab po qd prn for acute and severe pain (Patient taking differently: Take half to one tab po qd prn for acute and severe pain)   dicyclomine (BENTYL) 20 MG tablet Take 1 tablet (20 mg total) by mouth 4 (four) times daily -  before meals and at bedtime.   oxyCODONE-acetaminophen (PERCOCET) 10-325 MG tablet Take half to one tab po qd prn for acute and severe pain   [DISCONTINUED] oxyCODONE-acetaminophen (PERCOCET) 10-325 MG tablet Take half to one rab po qd prn for acute and severe pain   [DISCONTINUED] oxyCODONE-acetaminophen (PERCOCET) 10-325 MG tablet Take half to one rab po qd prn for  acute and severe pain   No facility-administered encounter medications on file as of 09/09/2021.    Surgical History: Past Surgical History:  Procedure Laterality Date   ABDOMINAL SURGERY     BREAST BIOPSY Right 04/27/2020   stereo bx, coil clip, path pending    CHOLECYSTECTOMY     COLONOSCOPY WITH PROPOFOL N/A 09/08/2018   Procedure: COLONOSCOPY WITH PROPOFOL;  Surgeon: Virgel Manifold, MD;  Location: [facility] ENDOSCOPY;  Service: Endoscopy;  Laterality: N/A;   COLONOSCOPY WITH PROPOFOL N/A 08/29/2021    Procedure: COLONOSCOPY WITH PROPOFOL;  Surgeon: Lin Landsman, MD;  Location: [facility];  Service: Endoscopy;  Laterality: N/A;   DECUBITUS ULCER EXCISION     DILATION AND CURETTAGE OF UTERUS     ESOPHAGOGASTRODUODENOSCOPY (EGD) WITH PROPOFOL N/A 09/08/2018   Procedure: ESOPHAGOGASTRODUODENOSCOPY (EGD) WITH PROPOFOL;  Surgeon: Virgel Manifold, MD;  Location: [facility] ENDOSCOPY;  Service: Endoscopy;  Laterality: N/A;   ESOPHAGOGASTRODUODENOSCOPY (EGD) WITH PROPOFOL N/A 08/29/2021   Procedure: ESOPHAGOGASTRODUODENOSCOPY (EGD) WITH PROPOFOL;  Surgeon: Lin Landsman, MD;  Location: [facility];  Service: Endoscopy;  Laterality: N/A;   GASTRIC BYPASS     GASTRIC BYPASS     REVISION GASTRIC RESTRICTIVE PROCEDURE FOR MORBID OBESITY      Medical History: Past Medical History:  Diagnosis Date   Anxiety    Asthma    Bilateral ovarian cysts    Chronic headaches    migraines about 1x/month   Depression    DVT (deep venous thrombosis) ([place]) 07/2014   right upper extremity   Hypothyroidism    Pancreatitis    Pancreatitis    PTSD (post-traumatic stress disorder)    Wears contact lenses     Family History: Family History  Problem Relation Age of Onset   Graves' disease Mother    Hypertension Mother    Hyperlipidemia Mother    Anxiety disorder Father    Prostate cancer Father    Depression Brother    Anxiety disorder Brother    Anxiety disorder Maternal Aunt    Depression Maternal Aunt    Anxiety disorder Paternal Aunt    Depression Paternal Aunt    Breast cancer Paternal Aunt    Anxiety disorder Maternal Uncle    Depression Maternal Uncle    Anxiety disorder Paternal Uncle    Depression Paternal Uncle    Anxiety disorder Maternal Grandfather    Depression Maternal Grandfather    Diabetes Maternal Grandfather    Anxiety disorder Maternal Grandmother    Depression Maternal Grandmother    Breast cancer Maternal Grandmother    Ovarian cancer Maternal  Grandmother    Diabetes Maternal Grandmother    Colon cancer Neg Hx     Social History   Socioeconomic History   Marital status: Single    Spouse name: Not on file   Number of children: Not on file   Years of education: Not on file   Highest education level: Not on file  Occupational History   Not on file  Tobacco Use   Smoking status: Some Days    Packs/day: 0.25    Types: Cigarettes    Start date: 03/26/2005   Smokeless tobacco: Never   Tobacco comments:    Now only smokes 3 days week  Vaping Use   Vaping Use: Never used  Substance and Sexual Activity   Alcohol use: No    Alcohol/week: 0.0 standard drinks   Drug use: Yes    Frequency: 3.0 times per week  Types: Marijuana    Comment: MJ use weekly   Sexual activity: Not Currently    Birth control/protection: Injection  Other Topics Concern   Not on file  Social History Narrative   Not on file   Social Determinants of Health   Financial Resource Strain: Not on file  Food Insecurity: Not on file  Transportation Needs: Not on file  Physical Activity: Not on file  Stress: Not on file  Social Connections: Not on file  Intimate Partner Violence: Not on file      Review of Systems  Constitutional:  Negative for chills, fatigue and unexpected weight change.  HENT:  Negative for congestion, postnasal drip, rhinorrhea, sneezing and sore throat.   Eyes:  Negative for redness.  Respiratory:  Negative for cough, chest tightness and shortness of breath.   Cardiovascular:  Negative for chest pain and palpitations.  Gastrointestinal:  Positive for abdominal pain. Negative for constipation, diarrhea, nausea and vomiting.  Genitourinary:  Negative for dysuria and frequency.  Musculoskeletal:  Negative for arthralgias, back pain, joint swelling and neck pain.  Skin:  Negative for rash.  Neurological: Negative.  Negative for tremors and numbness.  Hematological:  Negative for adenopathy. Does not bruise/bleed easily.   Psychiatric/Behavioral:  Positive for sleep disturbance. Negative for behavioral problems (Depression) and suicidal ideas. The patient is nervous/anxious.    Vital Signs: BP (!) 138/94 Comment: 135/98   Pulse 83    Temp 98.4 F (36.9 C)    Resp 16    Ht 6\' 1"  (1.854 m)    Wt 183 lb 12.8 oz (83.4 kg)    SpO2 98%    BMI 24.25 kg/m    Physical Exam Vitals and nursing note reviewed.  Constitutional:      Appearance: Normal appearance.  HENT:     Head: Normocephalic and atraumatic.     Nose: Nose normal.     Mouth/Throat:     Mouth: Mucous membranes are moist.     Pharynx: No posterior oropharyngeal erythema.  Eyes:     Extraocular Movements: Extraocular movements intact.     Pupils: Pupils are equal, round, and reactive to light.  Cardiovascular:     Rate and Rhythm: Normal rate and regular rhythm.     Pulses: Normal pulses.     Heart sounds: Normal heart sounds.  Pulmonary:     Effort: Pulmonary effort is normal.     Breath sounds: Normal breath sounds.  Abdominal:     General: Bowel sounds are normal.  Musculoskeletal:        General: Normal range of motion.  Skin:    General: Skin is warm and dry.  Neurological:     General: No focal deficit present.     Mental Status: She is alert.  Psychiatric:        Mood and Affect: Mood normal.        Behavior: Behavior normal.       Assessment/Plan: 1. Chronic abdominal pain May take half tablet one to two times per day only as needed for severe pain.  Goal is to taper off of pain medications.  If prolonged need will be referred to pain management - oxyCODONE-acetaminophen (PERCOCET) 10-325 MG tablet; Take half to one tab po qd prn for acute and severe pain  Dispense: 30 tablet; Refill: 0 [place] Controlled Substance Database was reviewed by me for overdose risk score (ORS) Reviewed risks and possible side effects associated with taking opiates, benzodiazepines and other CNS depressants. Combination  of these could cause dizziness  and drowsiness. Advised patient not to drive or operate machinery when taking these medications, as patient's and other's life can be at risk and will have consequences. Patient verbalized understanding in this matter. Dependence and abuse for these drugs will be monitored closely. A Controlled substance policy and procedure is on file which allows [facility] to order a urine drug screen test at any visit. Patient understands and agrees with the plan  2. Pancreatic insufficiency Recently diagnosed pancreatic insufficiency and will be starting on enzymes.  Hope that this will help with abdominal pain and enable patient to come off of pain medications  3. Iron deficiency anemia, unspecified iron deficiency anemia type Refer to hematology per GI  4. MDD (major depressive disorder), recurrent episode, moderate ([place]) Followed by behavioral health  5. Mixed hyperlipidemia - Lipid Panel With LDL/HDL Ratio  6. Other fatigue - TSH + free T4 - Lipid Panel With LDL/HDL Ratio   General Counseling: Bettie verbalizes understanding of the findings of todays visit and agrees with plan of treatment. I have discussed any further diagnostic evaluation that may be needed or ordered today. We also reviewed her medications today. she has been encouraged to call the office with any questions or concerns that should arise related to todays visit.    Orders Placed This Encounter  Procedures   TSH + free T4   Lipid Panel With LDL/HDL Ratio    Meds ordered this encounter  Medications   DISCONTD: oxyCODONE-acetaminophen (PERCOCET) 10-325 MG tablet    Sig: Take half to one rab po qd prn for acute and severe pain    Dispense:  30 tablet    Refill:  0    Patient will call when she needs to fill   DISCONTD: oxyCODONE-acetaminophen (PERCOCET) 10-325 MG tablet    Sig: Take half to one rab po qd prn for acute and severe pain    Dispense:  30 tablet    Refill:  0    Patient will call when she needs to  fill   oxyCODONE-acetaminophen (PERCOCET) 10-325 MG tablet    Sig: Take half to one tab po qd prn for acute and severe pain    Dispense:  30 tablet    Refill:  0    Patient will call when she needs to fill    This patient was seen by Drema Dallas, PA-C in collaboration with Dr. Clayborn Bigness as a part of collaborative care agreement.   Total time spent:30 Minutes Time spent includes review of chart, medications, test results, and follow up plan with the patient.      Dr Lavera Guise Internal medicine

## 2021-09-09 NOTE — Telephone Encounter (Signed)
Patient verbalized understanding of results made follow up appointment

## 2021-09-09 NOTE — Telephone Encounter (Signed)
Called to make follow up appointment for patient per vanga scheduled for 11/07/2021

## 2021-09-11 MED ORDER — OXYCODONE-ACETAMINOPHEN 10-325 MG PO TABS: ORAL_TABLET | ORAL | 0 refills | Status: DC

## 2021-09-16 ENCOUNTER — Telehealth: Payer: Self-pay

## 2021-09-16 NOTE — Telephone Encounter (Signed)
Short term disability paperwork received and placed on Lauren's desk.

## 2021-09-19 ENCOUNTER — Telehealth: Payer: Self-pay

## 2021-09-19 NOTE — Telephone Encounter (Signed)
PA sent at 110 pm on 09/19/2021

## 2021-09-19 NOTE — Telephone Encounter (Signed)
PA for OXYCODONE-ACETAMINOPHEN 10-325 mg was approved  valid from 09/19/21 to 09/19/22.  Pt informed [name]

## 2021-09-23 DIAGNOSIS — F4312 Post-traumatic stress disorder, chronic: Secondary | ICD-10-CM | POA: Diagnosis not present

## 2021-09-23 DIAGNOSIS — F4001 Agoraphobia with panic disorder: Secondary | ICD-10-CM | POA: Diagnosis not present

## 2021-09-23 DIAGNOSIS — F4011 Social phobia, generalized: Secondary | ICD-10-CM | POA: Diagnosis not present

## 2021-09-23 DIAGNOSIS — F331 Major depressive disorder, recurrent, moderate: Secondary | ICD-10-CM | POA: Diagnosis not present

## 2021-09-25 ENCOUNTER — Telehealth: Payer: Self-pay

## 2021-09-25 NOTE — Telephone Encounter (Signed)
[name] to call us back regarding her paperwork

## 2021-09-26 ENCOUNTER — Telehealth: Payer: Self-pay

## 2021-09-26 NOTE — Telephone Encounter (Signed)
[name] that we faxed paper for her disability to [employer] [phone]

## 2021-10-12 ENCOUNTER — Encounter: Payer: Self-pay | Admitting: Oncology

## 2021-10-21 DIAGNOSIS — F4312 Post-traumatic stress disorder, chronic: Secondary | ICD-10-CM | POA: Diagnosis not present

## 2021-10-21 DIAGNOSIS — F331 Major depressive disorder, recurrent, moderate: Secondary | ICD-10-CM | POA: Diagnosis not present

## 2021-10-21 DIAGNOSIS — F4001 Agoraphobia with panic disorder: Secondary | ICD-10-CM | POA: Diagnosis not present

## 2021-10-24 ENCOUNTER — Ambulatory Visit: Payer: BC Managed Care – PPO | Admitting: Physician Assistant

## 2021-10-31 ENCOUNTER — Encounter: Payer: Self-pay | Admitting: Physician Assistant

## 2021-10-31 ENCOUNTER — Ambulatory Visit: Payer: Medicare Other | Admitting: Physician Assistant

## 2021-10-31 ENCOUNTER — Telehealth: Payer: Self-pay

## 2021-10-31 ENCOUNTER — Encounter: Payer: Self-pay | Admitting: Oncology

## 2021-10-31 ENCOUNTER — Other Ambulatory Visit: Payer: Self-pay

## 2021-10-31 DIAGNOSIS — G8929 Other chronic pain: Secondary | ICD-10-CM

## 2021-10-31 DIAGNOSIS — F331 Major depressive disorder, recurrent, moderate: Secondary | ICD-10-CM | POA: Diagnosis not present

## 2021-10-31 DIAGNOSIS — R109 Unspecified abdominal pain: Secondary | ICD-10-CM | POA: Diagnosis not present

## 2021-10-31 DIAGNOSIS — F411 Generalized anxiety disorder: Secondary | ICD-10-CM

## 2021-10-31 DIAGNOSIS — R300 Dysuria: Secondary | ICD-10-CM | POA: Diagnosis not present

## 2021-10-31 DIAGNOSIS — Z1231 Encounter for screening mammogram for malignant neoplasm of breast: Secondary | ICD-10-CM | POA: Diagnosis not present

## 2021-10-31 DIAGNOSIS — Z0001 Encounter for general adult medical examination with abnormal findings: Secondary | ICD-10-CM

## 2021-10-31 DIAGNOSIS — G4709 Other insomnia: Secondary | ICD-10-CM

## 2021-10-31 DIAGNOSIS — Z79899 Other long term (current) drug therapy: Secondary | ICD-10-CM

## 2021-10-31 DIAGNOSIS — F4310 Post-traumatic stress disorder, unspecified: Secondary | ICD-10-CM

## 2021-10-31 DIAGNOSIS — K8689 Other specified diseases of pancreas: Secondary | ICD-10-CM | POA: Diagnosis not present

## 2021-10-31 LAB — POCT URINE DRUG SCREEN
Methylenedioxyamphetamine: NOT DETECTED
POC Amphetamine UR: NOT DETECTED
POC BENZODIAZEPINES UR: NOT DETECTED
POC Barbiturate UR: NOT DETECTED
POC Cocaine UR: NOT DETECTED
POC Ecstasy UR: NOT DETECTED
POC Marijuana UR: NOT DETECTED
POC Methadone UR: NOT DETECTED
POC Methamphetamine UR: NOT DETECTED
POC Opiate Ur: NOT DETECTED
POC Oxycodone UR: NOT DETECTED
POC PHENCYCLIDINE UR: NOT DETECTED
POC TRICYCLICS UR: POSITIVE — AB

## 2021-10-31 MED ORDER — OXYCODONE-ACETAMINOPHEN 10-325 MG PO TABS: ORAL_TABLET | ORAL | 0 refills | Status: DC

## 2021-10-31 NOTE — Progress Notes (Signed)
?[facility] ?[address] ?[place], [place] [address] ? ?Internal MEDICINE  ?Office Visit Note ? ?Patient Name: Stacy Pineda ? [id number]  ?[id number] ? ?Date of Service: 11/03/2021 ? ?Chief Complaint  ?Patient presents with  ? Medicare Wellness  ? Depression  ? Anxiety  ? Asthma  ? Nasal Congestion  ?  Started Monday - has tried Sudafed, but did not help  ? Headache  ? ? ? ?HPI ?Pt is here for routine health maintenance examination ?-Having difficulty sleeping since enzymes, has follow up with psych to address insomnia as she has been taking hydroxyzine '100mg'$  and has follow up after 2 weeks on this. ?-Goes back to GI on the 30th. GI also has her doing B12 shots herself and folic acid, and '40mg'$  omeprazole BID. Reports the enzymes haven't helped the pain. Still feels fatigued ?-Has not gotten labs done yet because car problems,  but will do so now ?-On disability currently ?-Up to date on pap and colonoscopy ?-some sinus congestions will try mucinex and nasal spray and avoid ABX unless needed due to already chronic GI upset ?-Stopped marijuana to try to help overall health ? ?Current Medication: ?Outpatient Encounter Medications as of 10/31/2021  ?Medication Sig  ? cyanocobalamin (,VITAMIN B-12,) 1000 MCG/ML injection Inject 1ML every week for 4 weeks then go to once a month  ? DULoxetine (CYMBALTA) 30 MG capsule Take 60 mg by mouth daily.  ? folic acid (FOLVITE) 1 MG tablet Take 1 tablet (1 mg total) by mouth daily.  ? lipase/protease/amylase (CREON) 36000 UNITS CPEP capsule Take 2 capsules with the first bite of each meal and 1 capsule with the first bite of each snack  ? medroxyPROGESTERone (DEPO-PROVERA) 150 MG/ML injection Inject 1 mL (150 mg total) into the muscle every 3 (three) months.  ? Multiple Vitamins-Minerals (MULTIVITAMIN ADULT PO) Take 1 tablet by mouth.  ? pregabalin (LYRICA) 50 MG capsule Take 1 capsule (50 mg total) by mouth 2 (two) times daily.  ? promethazine (PHENERGAN) 6.25 MG/5ML  syrup TAKE  10 ML BY MOUTH EVERY 8 HOURS AS NEEDED FOR NAUSEA AND VOMITING OR  REFRACTORY  NAUSEA/VOMITING  ? [DISCONTINUED] oxyCODONE-acetaminophen (PERCOCET) 10-325 MG tablet Take half to one tab po qd prn for acute and severe pain  ? [DISCONTINUED] QUEtiapine (SEROQUEL) 50 MG tablet Take 1 tablet (50 mg total) by mouth at bedtime.  ? dicyclomine (BENTYL) 20 MG tablet Take 1 tablet (20 mg total) by mouth 4 (four) times daily -  before meals and at bedtime.  ? omeprazole (PRILOSEC) 40 MG capsule Take 1 capsule (40 mg total) by mouth 2 (two) times daily before a meal.  ? oxyCODONE-acetaminophen (PERCOCET) 10-325 MG tablet Take half to one tab po qd prn for acute and severe pain  ? ?No facility-administered encounter medications on file as of 10/31/2021.  ? ? ?Surgical History: ?Past Surgical History:  ?Procedure Laterality Date  ? ABDOMINAL SURGERY    ? BREAST BIOPSY Right 04/27/2020  ? stereo bx, coil clip, path pending   ? CHOLECYSTECTOMY    ? COLONOSCOPY WITH PROPOFOL N/A 09/08/2018  ? Procedure: COLONOSCOPY WITH PROPOFOL;  Surgeon: Virgel Manifold, MD;  Location: [facility];  Service: Endoscopy;  Laterality: N/A;  ? COLONOSCOPY WITH PROPOFOL N/A 08/29/2021  ? Procedure: COLONOSCOPY WITH PROPOFOL;  Surgeon: Lin Landsman, MD;  Location: [facility];  Service: Endoscopy;  Laterality: N/A;  ? DECUBITUS ULCER EXCISION    ? DILATION AND CURETTAGE OF UTERUS    ?  ESOPHAGOGASTRODUODENOSCOPY (EGD) WITH PROPOFOL N/A 09/08/2018  ? Procedure: ESOPHAGOGASTRODUODENOSCOPY (EGD) WITH PROPOFOL;  Surgeon: Virgel Manifold, MD;  Location: [facility];  Service: Endoscopy;  Laterality: N/A;  ? ESOPHAGOGASTRODUODENOSCOPY (EGD) WITH PROPOFOL N/A 08/29/2021  ? Procedure: ESOPHAGOGASTRODUODENOSCOPY (EGD) WITH PROPOFOL;  Surgeon: Lin Landsman, MD;  Location: [facility];  Service: Endoscopy;  Laterality: N/A;  ? GASTRIC BYPASS    ? GASTRIC BYPASS    ? REVISION GASTRIC RESTRICTIVE PROCEDURE FOR  MORBID OBESITY    ? ? ?Medical History: ?Past Medical History:  ?Diagnosis Date  ? Anxiety   ? Asthma   ? Bilateral ovarian cysts   ? Chronic headaches   ? migraines about 1x/month  ? Depression   ? DVT (deep venous thrombosis) ([facility]) 07/2014  ? right upper extremity  ? Hypothyroidism   ? Pancreatitis   ? Pancreatitis   ? PTSD (post-traumatic stress disorder)   ? Wears contact lenses   ? ? ?Family History: ?Family History  ?Problem Relation Age of Onset  ? Graves' disease Mother   ? Hypertension Mother   ? Hyperlipidemia Mother   ? Anxiety disorder Father   ? Prostate cancer Father   ? Depression Brother   ? Anxiety disorder Brother   ? Anxiety disorder Maternal Aunt   ? Depression Maternal Aunt   ? Anxiety disorder Paternal Aunt   ? Depression Paternal Aunt   ? Breast cancer Paternal Aunt   ? Anxiety disorder Maternal Uncle   ? Depression Maternal Uncle   ? Anxiety disorder Paternal Uncle   ? Depression Paternal Uncle   ? Anxiety disorder Maternal Grandfather   ? Depression Maternal Grandfather   ? Diabetes Maternal Grandfather   ? Anxiety disorder Maternal Grandmother   ? Depression Maternal Grandmother   ? Breast cancer Maternal Grandmother   ? Ovarian cancer Maternal Grandmother   ? Diabetes Maternal Grandmother   ? Colon cancer Neg Hx   ? ? ? ? ?Review of Systems  ?Constitutional:  Negative for chills, fatigue and unexpected weight change.  ?HENT:  Positive for congestion and postnasal drip. Negative for rhinorrhea, sneezing and sore throat.   ?Eyes:  Negative for redness.  ?Respiratory:  Negative for cough, chest tightness and shortness of breath.   ?Cardiovascular:  Negative for chest pain and palpitations.  ?Gastrointestinal:  Positive for abdominal pain. Negative for constipation, diarrhea, nausea and vomiting.  ?Genitourinary:  Negative for dysuria and frequency.  ?Musculoskeletal:  Negative for arthralgias, back pain, joint swelling and neck pain.  ?Skin:  Negative for rash.  ?Neurological: Negative.   Negative for tremors and numbness.  ?Hematological:  Negative for adenopathy. Does not bruise/bleed easily.  ?Psychiatric/Behavioral:  Positive for sleep disturbance. Negative for behavioral problems (Depression) and suicidal ideas. The patient is nervous/anxious.   ? ? ?Vital Signs: ?BP 123/82   Pulse 91   Temp 97.9 ?F (36.6 ?C)   Resp 16   Ht '6\' 1"'$  (1.854 m)   Wt 182 lb (82.6 kg)   SpO2 99%   BMI 24.01 kg/m?  ? ? ?Physical Exam ?Vitals and nursing note reviewed.  ?Constitutional:   ?   Appearance: Normal appearance.  ?HENT:  ?   Head: Normocephalic and atraumatic.  ?   Nose: Nose normal.  ?   Mouth/Throat:  ?   Mouth: Mucous membranes are moist.  ?   Pharynx: No posterior oropharyngeal erythema.  ?Eyes:  ?   Extraocular Movements: Extraocular movements intact.  ?   Pupils: Pupils are  equal, round, and reactive to light.  ?Cardiovascular:  ?   Rate and Rhythm: Normal rate and regular rhythm.  ?   Pulses: Normal pulses.  ?   Heart sounds: Normal heart sounds.  ?Pulmonary:  ?   Effort: Pulmonary effort is normal.  ?   Breath sounds: Normal breath sounds.  ?Abdominal:  ?   General: Bowel sounds are normal.  ?   Tenderness: There is abdominal tenderness.  ?Musculoskeletal:     ?   General: Normal range of motion.  ?Skin: ?   General: Skin is warm and dry.  ?Neurological:  ?   General: No focal deficit present.  ?   Mental Status: She is alert.  ?Psychiatric:     ?   Mood and Affect: Mood normal.     ?   Behavior: Behavior normal.  ? ? ? ?LABS: ?Recent Results (from the past 2160 hour(s))  ?Pregnancy, urine POC     Status: None  ? Collection Time: 08/29/21  9:02 AM  ?Result Value Ref Range  ? Preg Test, Ur NEGATIVE NEGATIVE  ?  Comment:        ?THE SENSITIVITY OF THIS ?METHODOLOGY IS >24 mIU/mL ?  ?Surgical pathology     Status: None  ? Collection Time: 08/29/21  9:03 AM  ?Result Value Ref Range  ? SURGICAL PATHOLOGY    ?  SURGICAL PATHOLOGY ?CASE: [id number] ?PATIENT: Stacy Pineda ?Surgical Pathology  Report ? ? ? ? ?Specimen Submitted: ?A. Duodenum; bx ?B. Stomach; bx ?C. Colon, random; bx ? ?Clinical History: Chronic diarrhea K52.9 other iron deficiency anemia ?D50.8.  Esophagitis, gastric ulcer, normal colon ? ? ? ? ? ?DI

## 2021-10-31 NOTE — Telephone Encounter (Signed)
Patient is being referredto psychiatry and is requesting to have referral sent to [facility], [place]. Referral printed, and placed in  Toni's folder. ?

## 2021-11-01 ENCOUNTER — Telehealth: Payer: Self-pay

## 2021-11-01 LAB — UA/M W/RFLX CULTURE, ROUTINE
Bilirubin, UA: NEGATIVE
Glucose, UA: NEGATIVE
Ketones, UA: NEGATIVE
Leukocytes,UA: NEGATIVE
Nitrite, UA: NEGATIVE
RBC, UA: NEGATIVE
Specific Gravity, UA: >=1.03 — AB (ref 1.005–1.030)
Urobilinogen, Ur: 1 mg/dL (ref 0.2–1.0)
pH, UA: 5.5 (ref 5.0–7.5)

## 2021-11-01 LAB — MICROSCOPIC EXAMINATION
Casts: NONE SEEN /lpf
Epithelial Cells (non renal): >10 /hpf — AB (ref 0–10)

## 2021-11-01 NOTE — Telephone Encounter (Signed)
[facility] referral emailed to [employer] [email or web address]-Toni ?

## 2021-11-04 DIAGNOSIS — F4312 Post-traumatic stress disorder, chronic: Secondary | ICD-10-CM | POA: Diagnosis not present

## 2021-11-04 DIAGNOSIS — F331 Major depressive disorder, recurrent, moderate: Secondary | ICD-10-CM | POA: Diagnosis not present

## 2021-11-04 DIAGNOSIS — F4011 Social phobia, generalized: Secondary | ICD-10-CM | POA: Diagnosis not present

## 2021-11-04 DIAGNOSIS — F4001 Agoraphobia with panic disorder: Secondary | ICD-10-CM | POA: Diagnosis not present

## 2021-11-06 DIAGNOSIS — F4001 Agoraphobia with panic disorder: Secondary | ICD-10-CM | POA: Diagnosis not present

## 2021-11-06 DIAGNOSIS — F331 Major depressive disorder, recurrent, moderate: Secondary | ICD-10-CM | POA: Diagnosis not present

## 2021-11-07 ENCOUNTER — Ambulatory Visit (INDEPENDENT_AMBULATORY_CARE_PROVIDER_SITE_OTHER): Payer: BC Managed Care – PPO | Admitting: Gastroenterology

## 2021-11-07 ENCOUNTER — Encounter: Payer: Self-pay | Admitting: Gastroenterology

## 2021-11-07 ENCOUNTER — Other Ambulatory Visit: Payer: Self-pay

## 2021-11-07 VITALS — BP 144/99 | HR 85 | Temp 98.9°F | Ht 73.0 in | Wt 186.1 lb

## 2021-11-07 DIAGNOSIS — D509 Iron deficiency anemia, unspecified: Secondary | ICD-10-CM

## 2021-11-07 DIAGNOSIS — E538 Deficiency of other specified B group vitamins: Secondary | ICD-10-CM

## 2021-11-07 DIAGNOSIS — R109 Unspecified abdominal pain: Secondary | ICD-10-CM | POA: Diagnosis not present

## 2021-11-07 DIAGNOSIS — K529 Noninfective gastroenteritis and colitis, unspecified: Secondary | ICD-10-CM

## 2021-11-07 DIAGNOSIS — K8681 Exocrine pancreatic insufficiency: Secondary | ICD-10-CM

## 2021-11-07 MED ORDER — DICYCLOMINE HCL 20 MG PO TABS: 20.0000 mg | ORAL_TABLET | Freq: Three times a day (TID) | ORAL | 1 refills | Status: DC

## 2021-11-07 MED ORDER — RIFAXIMIN 550 MG PO TABS: 550.0000 mg | ORAL_TABLET | Freq: Three times a day (TID) | ORAL | 0 refills | Status: AC

## 2021-11-07 NOTE — Patient Instructions (Signed)
gAVE fUSION SAMPLES  ?

## 2021-11-07 NOTE — Progress Notes (Signed)
?  ?[name], MD ?[address]  ?[address]  ?[place], [place] [address]  ?Main: [phone]  ?Fax: [phone] ?Pager: [phone] ? ? ?Primary Care Physician: Mylinda Latina, PA-C ? ?Primary Gastroenterologist:  Dr. [name] ? ?Chief Complaint  ?Patient presents with  ? Diarrhea  ?  6-7 times a day the creon has not helped   ? Abdominal Pain  ?  All over her stomach after she eats epigastric   ? Nausea  ?  Has nausea all the time vomiting 2-3 times a week   ? ? ? ?HPI: Stacy Pineda is a 41 y.o. female with history of laparoscopic reversal of gastric bypass, partial gastrectomy in 2015 secondary to chronic recurrent marginal ulcer, cholecystectomy is referred to me by Dr. Bonna Gains to evaluate for hemorrhoid ligation due to intermittent rectal bleeding.  Patient underwent colonoscopy for chronic diarrhea and was found to have internal hemorrhoids.  She had random colon biopsies which were unremarkable for any inflammation.  Patient reports ongoing diarrhea.  She is currently on ciprofloxacin started by her PCP for nausea and vomiting, thought to be viral gastroenteritis.  Patient is more concerned about ongoing diarrhea more than rectal bleeding, has postprandial urgency.  She does have chronic iron deficiency anemia, closely followed by hematology for parenteral iron.  Patient has history of PTSD, anxiety and depression and has been going through a lot of health issues.  Her quality of life is significantly disrupted due to her GI symptoms and hoping to find some answers.  She is currently on Remeron, Klonopin, Effexor managed by her psychiatrist ? ?Follow-up visit 07/25/2021 ?Patient was lost to follow-up since 01/2019.  Patient is here to discuss about chronic symptoms of upper abdominal discomfort associated with abdominal bloating, postprandial urgency and loose stools.  She does report history of nausea, dry heaving but without any vomiting.  Patient has been gaining weight.  She does  have known history of chronic iron deficiency anemia.  Patient denies any weight loss, rectal bleeding.  Patient was admitted to [facility] in early October 2022 secondary to abdominal pain, nausea, her serum lipase was in 500s, found to have severe iron deficiency anemia with no evidence of active GI bleed.  She was managed as acute on chronic pancreatitis.  Patient also received iron infusions x 2 at [facility].  Her most recent hemoglobin was 7.9 on 05/23/2021, CV 58.7, no evidence of chronic liver disease.  She underwent CT abdomen and pelvis which revealed normal pancreas.  H. pylori stool antigen negative, normal vitamin D, folate, copper and zinc levels.  She denies smoking marijuana but she does have elevated marijuana to fight with the pain.  Her TSH is normal.  She had EGD and colonoscopy in 2020 which was normal.  Gastric emptying study was normal.  Patient also tells me that she tried pancreatic enzymes in the past with not much relief.  She is requesting if she can have some pain medication ? ?Follow-up visit 11/07/2021 ?Patient is here for follow-up of chronic diarrhea, iron and B12 deficiency anemia, abdominal pain.  She underwent upper endoscopy which revealed bleeding gastric ulcer, treated with cautery, started omeprazole 40 mg p.o. twice daily which she is taking.  She is taking B12 injections as well as oral folic acid daily.  I also referred her to hematology, does not have an appointment yet.  Her pancreatic fecal elastase levels were less than 50, started her on Creon 36 K 2 capsules with each meal  and 1 with snack.  Patient reports that her diarrhea is more or less the same even though the consistency has somewhat improved.  Patient is taking Bentyl 20 mg as needed. ? ?Current Outpatient Medications  ?Medication Sig Dispense Refill  ? cyanocobalamin (,VITAMIN B-12,) 1000 MCG/ML injection Inject 1ML every week for 4 weeks then go to once a month 5 mL 6  ? DULoxetine (CYMBALTA) 30 MG capsule Take 60 mg by  mouth daily.    ? DULoxetine (CYMBALTA) 60 MG capsule SMARTSIG:1 Capsule(s) By Mouth Every Evening    ? eszopiclone (LUNESTA) 1 MG TABS tablet Take 1 mg by mouth at bedtime.    ? folic acid (FOLVITE) 1 MG tablet Take 1 tablet (1 mg total) by mouth daily. 30 tablet 5  ? lipase/protease/amylase (CREON) 36000 UNITS CPEP capsule Take 2 capsules with the first bite of each meal and 1 capsule with the first bite of each snack 240 capsule 5  ? Multiple Vitamins-Minerals (MULTIVITAMIN ADULT PO) Take 1 tablet by mouth.    ? omeprazole (PRILOSEC) 40 MG capsule Take 1 capsule (40 mg total) by mouth 2 (two) times daily before a meal. 60 capsule 3  ? oxyCODONE-acetaminophen (PERCOCET) 10-325 MG tablet Take half to one tab po qd prn for acute and severe pain 30 tablet 0  ? prazosin (MINIPRESS) 2 MG capsule Take 2 mg by mouth at bedtime.    ? pregabalin (LYRICA) 50 MG capsule Take 1 capsule (50 mg total) by mouth 2 (two) times daily. 60 capsule 2  ? promethazine (PHENERGAN) 6.25 MG/5ML syrup TAKE  10 ML BY MOUTH EVERY 8 HOURS AS NEEDED FOR NAUSEA AND VOMITING OR  REFRACTORY  NAUSEA/VOMITING 240 mL 2  ? rifaximin (XIFAXAN) 550 MG TABS tablet Take 1 tablet (550 mg total) by mouth 3 (three) times daily for 14 days. 42 tablet 0  ? dicyclomine (BENTYL) 20 MG tablet Take 1 tablet (20 mg total) by mouth every 8 (eight) hours. As needed 120 tablet 1  ? ?No current facility-administered medications for this visit.  ? ? ?Allergies as of 11/07/2021 - Review Complete 11/07/2021  ?Allergen Reaction Noted  ? Fentanyl Itching 03/27/2015  ? Morphine and related Itching 03/27/2015  ? Vicodin [hydrocodone-acetaminophen] Itching 03/27/2015  ? ? ?NSAIDs: None ? ?Antiplts/Anticoagulants/Anti thrombotics: None ? ?GI procedures:  ?EGD and colonoscopy 08/29/2021 ?- Normal duodenal bulb, second portion of the duodenum and third portion of the duodenum. Biopsied. ?- Oozing gastric ulcer with oozing hemorrhage ([place] Class Ib). Treated with a monopolar  probe. ?- A gastric bypass was found, characterized by healthy appearing mucosa. Biopsied. ?- LA Grade B reflux esophagitis with no bleeding. ?- A staple was found in the stomach. Removal was successful. ? ?- Preparation of the colon was poor. ?- Perianal skin tags found on perianal exam. ?- The examined portion of the ileum was normal. ?- Normal mucosa in the entire examined colon. Biopsied. ?- Stool in the entire examined colon. ?- The distal rectum and anal verge are normal on retroflexion view. ? ?EGD and colonoscopy 09/08/2018 ?- Normal gastroesophageal junction and esophagus. Biopsied. ?- A, history of gastric bypass and reversal with sutures, were found, characterized by no stomal ulceration and ?healthy appearing mucosa. ?- Normal examined duodenum. ? ?- Preparation of the colon was fair. ?- Hemorrhoids found on perianal exam. ?- The rectum, sigmoid colon, descending colon, transverse colon, ascending colon and cecum are normal. ?Biopsied. ?- Non-bleeding internal hemorrhoids. ? ?DIAGNOSIS:  ?A. STOMACH, BODY; COLD BIOPSY:  ?-  GASTRIC ANTRAL AND OXYNTIC MUCOSA WITH MILD CHRONIC INACTIVE GASTRITIS  ?AND FOVEOLAR HYPERPLASIA.  ?- NEGATIVE FOR H. PYLORI, DYSPLASIA, AND MALIGNANCY.  ? ?B. ESOPHAGUS; COLD BIOPSY:  ?- BENIGN SQUAMOUS MUCOSA WITH NO SIGNIFICANT HISTOPATHOLOGIC CHANGE.  ?- NO INCREASE IN INTRAEPITHELIAL EOSINOPHILS (LESS THAN 2 PER HPF).  ?- NEGATIVE FOR DYSPLASIA AND MALIGNANCY.  ? ?C. COLON, RANDOM; COLD BIOPSY:  ?- BENIGN COLONIC MUCOSA WITH NO SIGNIFICANT HISTOPATHOLOGIC CHANGE.  ?- NEGATIVE FOR FEATURES OF MICROSCOPIC COLITIS, DYSPLASIA, AND  ?MALIGNANCY.  ? ?ROS: ? ?General: Negative for anorexia, weight loss, fever, chills, fatigue, weakness. ?ENT: Negative for hoarseness, difficulty swallowing , nasal congestion. ?CV: Negative for chest pain, angina, palpitations, dyspnea on exertion, peripheral edema.  ?Respiratory: Negative for dyspnea at rest, dyspnea on exertion, cough, sputum, wheezing.   ?GI: See history of present illness. ?GU:  Negative for dysuria, hematuria, urinary incontinence, urinary frequency, nocturnal urination.  ?Endo: Negative for unusual weight change.  ?  ?Physical Examination

## 2021-11-08 LAB — CBC
Hematocrit: 36.1 % (ref 34.0–46.6)
Hemoglobin: 11.6 g/dL (ref 11.1–15.9)
MCH: 23.2 pg — ABNORMAL LOW (ref 26.6–33.0)
MCHC: 32.1 g/dL (ref 31.5–35.7)
MCV: 72 fL — ABNORMAL LOW (ref 79–97)
Platelets: 272 10*3/uL (ref 150–450)
RBC: 5.01 x10E6/uL (ref 3.77–5.28)
RDW: 17.7 % — ABNORMAL HIGH (ref 11.7–15.4)
WBC: 7.1 10*3/uL (ref 3.4–10.8)

## 2021-11-08 LAB — IRON,TIBC AND FERRITIN PANEL
Ferritin: 16 ng/mL (ref 15–150)
Iron Saturation: 20 % (ref 15–55)
Iron: 84 ug/dL (ref 27–159)
Total Iron Binding Capacity: 414 ug/dL (ref 250–450)
UIBC: 330 ug/dL (ref 131–425)

## 2021-11-08 LAB — B12 AND FOLATE PANEL
Folate: 2.4 ng/mL — ABNORMAL LOW (ref >3.0)
Vitamin B-12: 139 pg/mL — ABNORMAL LOW (ref 232–1245)

## 2021-11-11 ENCOUNTER — Telehealth: Payer: Self-pay

## 2021-11-11 DIAGNOSIS — D509 Iron deficiency anemia, unspecified: Secondary | ICD-10-CM

## 2021-11-11 DIAGNOSIS — E538 Deficiency of other specified B group vitamins: Secondary | ICD-10-CM

## 2021-11-11 DIAGNOSIS — K529 Noninfective gastroenteritis and colitis, unspecified: Secondary | ICD-10-CM

## 2021-11-11 MED ORDER — FOLIC ACID 1 MG PO TABS: 2.0000 mg | ORAL_TABLET | Freq: Every day | ORAL | 2 refills | Status: AC

## 2021-11-11 MED ORDER — CYANOCOBALAMIN 1000 MCG/ML IJ SOLN: INTRAMUSCULAR | 3 refills | Status: DC

## 2021-11-11 NOTE — Telephone Encounter (Signed)
Sent medication to the pharmacy and order lab work for 1 month  ?

## 2021-11-11 NOTE — Telephone Encounter (Signed)
-----   Message from Lin Landsman, MD sent at 11/08/2021 11:13 AM EDT ----- ?Called patient to discuss labs, informed her about persistent B12 and folate deficiency along with iron deficiency. ? ?Advised to increase folic acid to 2 mg daily and B12 injections 1000 mcg every week for 4 weeks and then every 2 weeks for 3 months.  Please send in prescriptions for folic acid and X03 as above ? ?Recheck CBC, iron panel, B12 and folate levels in 1 month ? ?Thanks ?Stacy Pineda ?

## 2021-11-15 ENCOUNTER — Encounter: Payer: Self-pay | Admitting: Oncology

## 2021-11-19 ENCOUNTER — Encounter: Payer: Self-pay | Admitting: Oncology

## 2021-11-19 ENCOUNTER — Encounter: Payer: Self-pay | Admitting: Nurse Practitioner

## 2021-11-19 ENCOUNTER — Telehealth: Payer: Self-pay

## 2021-11-19 ENCOUNTER — Telehealth (INDEPENDENT_AMBULATORY_CARE_PROVIDER_SITE_OTHER): Payer: Medicare Other | Admitting: Nurse Practitioner

## 2021-11-19 VITALS — Temp 99.4°F

## 2021-11-19 DIAGNOSIS — R051 Acute cough: Secondary | ICD-10-CM

## 2021-11-19 DIAGNOSIS — R062 Wheezing: Secondary | ICD-10-CM

## 2021-11-19 DIAGNOSIS — J0110 Acute frontal sinusitis, unspecified: Secondary | ICD-10-CM | POA: Diagnosis not present

## 2021-11-19 MED ORDER — AMOXICILLIN-POT CLAVULANATE 875-125 MG PO TABS: 1.0000 | ORAL_TABLET | Freq: Two times a day (BID) | ORAL | 0 refills | Status: DC

## 2021-11-19 MED ORDER — HYDROCOD POLI-CHLORPHE POLI ER 10-8 MG/5ML PO SUER: 5.0000 mL | Freq: Two times a day (BID) | ORAL | 0 refills | Status: DC | PRN

## 2021-11-19 MED ORDER — PREDNISONE 10 MG PO TABS: ORAL_TABLET | ORAL | 0 refills | Status: DC

## 2021-11-19 NOTE — Progress Notes (Signed)
[facility] ?[address] ?[place], [place] [address] ? ?Internal MEDICINE  ?Telephone Visit ? ?Patient Name: Stacy Pineda ? [id number]  ?[id number] ? ?Date of Service: 11/19/2021 ? ?I connected with the patient at 3:05 PM by telephone and verified the patients identity using two identifiers.   ?I discussed the limitations, risks, security and privacy concerns of performing an evaluation and management service by telephone and the availability of in person appointments. I also discussed with the patient that there may be a patient responsible charge related to the service.  The patient expressed understanding and agrees to proceed.   ? ?Chief Complaint  ?Patient presents with  ? Telephone Screen  ?  [phone]  ? Telephone Assessment  ?   ?  ? Cough  ?  Cough is starting to hurt chest, wheezing, short of breath  ? Sore Throat  ? ? ?HPI ?Stacy Pineda presents for a telehealth virtual visit for cough, chest tightness, wheezing and shortness of breath. She also reports having a sore throat. Her symptoms started on Wednesday. She tried taking OTC sudafed and mucinex but it was not helping.  ? ? ?Current Medication: ?Outpatient Encounter Medications as of 11/19/2021  ?Medication Sig  ? amoxicillin-clavulanate (AUGMENTIN) 875-125 MG tablet Take 1 tablet by mouth 2 (two) times daily.  ? chlorpheniramine-HYDROcodone (TUSSIONEX PENNKINETIC ER) 10-8 MG/5ML Take 5 mLs by mouth every 12 (twelve) hours as needed for cough.  ? cyanocobalamin (,VITAMIN B-12,) 1000 MCG/ML injection Inject 1ML every week for 4 weeks and then every other week for 2 months.  ? dicyclomine (BENTYL) 20 MG tablet Take 1 tablet (20 mg total) by mouth every 8 (eight) hours. As needed  ? DULoxetine (CYMBALTA) 30 MG capsule Take 60 mg by mouth daily.  ? DULoxetine (CYMBALTA) 60 MG capsule SMARTSIG:1 Capsule(s) By Mouth Every Evening  ? eszopiclone (LUNESTA) 1 MG TABS tablet Take 1 mg by mouth at bedtime.  ? folic acid (FOLVITE) 1 MG tablet Take 2 tablets (2  mg total) by mouth daily.  ? lipase/protease/amylase (CREON) 36000 UNITS CPEP capsule Take 2 capsules with the first bite of each meal and 1 capsule with the first bite of each snack  ? Multiple Vitamins-Minerals (MULTIVITAMIN ADULT PO) Take 1 tablet by mouth.  ? oxyCODONE-acetaminophen (PERCOCET) 10-325 MG tablet Take half to one tab po qd prn for acute and severe pain  ? prazosin (MINIPRESS) 2 MG capsule Take 2 mg by mouth at bedtime.  ? predniSONE (DELTASONE) 10 MG tablet Take one tab 3 x day for 3 days, then take one tab 2 x a day for 3 days and then take one tab a day for 3 days for copd  ? pregabalin (LYRICA) 50 MG capsule Take 1 capsule (50 mg total) by mouth 2 (two) times daily.  ? promethazine (PHENERGAN) 6.25 MG/5ML syrup TAKE  10 ML BY MOUTH EVERY 8 HOURS AS NEEDED FOR NAUSEA AND VOMITING OR  REFRACTORY  NAUSEA/VOMITING  ? rifaximin (XIFAXAN) 550 MG TABS tablet Take 1 tablet (550 mg total) by mouth 3 (three) times daily for 14 days.  ? omeprazole (PRILOSEC) 40 MG capsule Take 1 capsule (40 mg total) by mouth 2 (two) times daily before a meal.  ? ?No facility-administered encounter medications on file as of 11/19/2021.  ? ? ?Surgical History: ?Past Surgical History:  ?Procedure Laterality Date  ? ABDOMINAL SURGERY    ? BREAST BIOPSY Right 04/27/2020  ? stereo bx, coil clip, path pending   ? CHOLECYSTECTOMY    ?  COLONOSCOPY WITH PROPOFOL N/A 09/08/2018  ? Procedure: COLONOSCOPY WITH PROPOFOL;  Surgeon: Virgel Manifold, MD;  Location: [facility];  Service: Endoscopy;  Laterality: N/A;  ? COLONOSCOPY WITH PROPOFOL N/A 08/29/2021  ? Procedure: COLONOSCOPY WITH PROPOFOL;  Surgeon: Lin Landsman, MD;  Location: [facility];  Service: Endoscopy;  Laterality: N/A;  ? DECUBITUS ULCER EXCISION    ? DILATION AND CURETTAGE OF UTERUS    ? ESOPHAGOGASTRODUODENOSCOPY (EGD) WITH PROPOFOL N/A 09/08/2018  ? Procedure: ESOPHAGOGASTRODUODENOSCOPY (EGD) WITH PROPOFOL;  Surgeon: Virgel Manifold, MD;   Location: [facility];  Service: Endoscopy;  Laterality: N/A;  ? ESOPHAGOGASTRODUODENOSCOPY (EGD) WITH PROPOFOL N/A 08/29/2021  ? Procedure: ESOPHAGOGASTRODUODENOSCOPY (EGD) WITH PROPOFOL;  Surgeon: Lin Landsman, MD;  Location: [facility];  Service: Endoscopy;  Laterality: N/A;  ? GASTRIC BYPASS    ? GASTRIC BYPASS    ? REVISION GASTRIC RESTRICTIVE PROCEDURE FOR MORBID OBESITY    ? ? ?Medical History: ?Past Medical History:  ?Diagnosis Date  ? Anxiety   ? Asthma   ? Bilateral ovarian cysts   ? Chronic headaches   ? migraines about 1x/month  ? Depression   ? DVT (deep venous thrombosis) ([place]) 07/2014  ? right upper extremity  ? Hypothyroidism   ? Pancreatitis   ? Pancreatitis   ? PTSD (post-traumatic stress disorder)   ? Wears contact lenses   ? ? ?Family History: ?Family History  ?Problem Relation Age of Onset  ? Graves' disease Mother   ? Hypertension Mother   ? Hyperlipidemia Mother   ? Anxiety disorder Father   ? Prostate cancer Father   ? Depression Brother   ? Anxiety disorder Brother   ? Anxiety disorder Maternal Aunt   ? Depression Maternal Aunt   ? Anxiety disorder Paternal Aunt   ? Depression Paternal Aunt   ? Breast cancer Paternal Aunt   ? Anxiety disorder Maternal Uncle   ? Depression Maternal Uncle   ? Anxiety disorder Paternal Uncle   ? Depression Paternal Uncle   ? Anxiety disorder Maternal Grandfather   ? Depression Maternal Grandfather   ? Diabetes Maternal Grandfather   ? Anxiety disorder Maternal Grandmother   ? Depression Maternal Grandmother   ? Breast cancer Maternal Grandmother   ? Ovarian cancer Maternal Grandmother   ? Diabetes Maternal Grandmother   ? Colon cancer Neg Hx   ? ? ?Social History  ? ?Socioeconomic History  ? Marital status: Single  ?  Spouse name: Not on file  ? Number of children: Not on file  ? Years of education: Not on file  ? Highest education level: Not on file  ?Occupational History  ? Not on file  ?Tobacco Use  ? Smoking status: Some Days  ?   Packs/day: 0.25  ?  Types: Cigarettes  ?  Start date: 03/26/2005  ? Smokeless tobacco: Never  ? Tobacco comments:  ?  2 cigarettes daily  ?Vaping Use  ? Vaping Use: Never used  ?Substance and Sexual Activity  ? Alcohol use: No  ?  Alcohol/week: 0.0 standard drinks  ? Drug use: Yes  ?  Frequency: 3.0 times per week  ?  Types: Marijuana  ?  Comment: MJ use weekly  ? Sexual activity: Not Currently  ?  Birth control/protection: Injection  ?Other Topics Concern  ? Not on file  ?Social History Narrative  ? Not on file  ? ?Social Determinants of Health  ? ?Financial Resource Strain: Not on file  ?Food Insecurity:  Not on file  ?Transportation Needs: Not on file  ?Physical Activity: Not on file  ?Stress: Not on file  ?Social Connections: Not on file  ?Intimate Partner Violence: Not on file  ? ? ? ? ?Review of Systems  ?Constitutional:  Negative for chills, fatigue and fever.  ?HENT:  Positive for congestion, postnasal drip, rhinorrhea, sinus pressure, sinus pain, sore throat and voice change. Negative for ear pain.   ?Respiratory:  Positive for cough, chest tightness and wheezing. Negative for shortness of breath.   ?Cardiovascular: Negative.  Negative for chest pain and palpitations.  ?Gastrointestinal:  Negative for abdominal pain, diarrhea, nausea and vomiting.  ?Neurological:  Negative for headaches.  ? ?Vital Signs: ?Temp 99.4 ?F (37.4 ?C)  ? ? ?Observation/Objective: ?She is alert and oriented and engages in conversation appropriately.  She does not appear to be in any acute distress over video call.  Her voice sounds hoarse and raspy. ? ? ? ?Assessment/Plan: ?1. Acute non-recurrent frontal sinusitis ?empiric antibiotic treatment prescribed ?- amoxicillin-clavulanate (AUGMENTIN) 875-125 MG tablet; Take 1 tablet by mouth 2 (two) times daily.  Dispense: 20 tablet; Refill: 0 ? ?2. Acute cough ?Medication prescribed for symptomatic relief of cough ?- chlorpheniramine-HYDROcodone (TUSSIONEX PENNKINETIC ER) 10-8 MG/5ML; Take  5 mLs by mouth every 12 (twelve) hours as needed for cough.  Dispense: 140 mL; Refill: 0 ? ?3. Wheezing ?Prednisone taper prescribed to decrease inflammation and wheezing. ?- predniSONE (DELTASONE) 10 MG tablet;

## 2021-11-19 NOTE — Telephone Encounter (Signed)
Spoke with [facility] ph[facility] and canceled tussinex pres  ?

## 2021-11-20 ENCOUNTER — Telehealth: Payer: Self-pay

## 2021-11-20 ENCOUNTER — Telehealth: Payer: Self-pay | Admitting: Gastroenterology

## 2021-11-20 MED ORDER — HYDROCOD POLI-CHLORPHE POLI ER 10-8 MG/5ML PO SUER: 5.0000 mL | Freq: Two times a day (BID) | ORAL | 0 refills | Status: DC | PRN

## 2021-11-20 NOTE — Telephone Encounter (Signed)
Pt called and asked if the T[facility]ex [facility] due to the [facility] was on backorder.  Per Alyssa [facility] and I notified pt that it was sent. ?

## 2021-11-20 NOTE — Addendum Note (Signed)
Addended by: Jonetta Osgood on: 11/20/2021 12:18 PM ? ? Modules accepted: Orders ? ?

## 2021-11-20 NOTE — Telephone Encounter (Signed)
Medical records were sent out to Medical information intake P.Lina Sayre M.O. ?[phone] ?

## 2021-11-27 ENCOUNTER — Telehealth: Payer: Self-pay

## 2021-11-27 NOTE — Telephone Encounter (Signed)
Patient called to cancel appointment for 11/28/2021 and will call back to reschedule on a different day. ?

## 2021-11-27 NOTE — Telephone Encounter (Signed)
Completed medical records for [facility]  ?Faxed to [phone] ?

## 2021-11-28 ENCOUNTER — Ambulatory Visit: Payer: Medicare Other | Admitting: Physician Assistant

## 2021-11-29 DIAGNOSIS — F4312 Post-traumatic stress disorder, chronic: Secondary | ICD-10-CM | POA: Diagnosis not present

## 2021-11-29 DIAGNOSIS — F331 Major depressive disorder, recurrent, moderate: Secondary | ICD-10-CM | POA: Diagnosis not present

## 2021-11-29 DIAGNOSIS — F4001 Agoraphobia with panic disorder: Secondary | ICD-10-CM | POA: Diagnosis not present

## 2021-11-29 DIAGNOSIS — F4011 Social phobia, generalized: Secondary | ICD-10-CM | POA: Diagnosis not present

## 2021-12-23 DIAGNOSIS — F4011 Social phobia, generalized: Secondary | ICD-10-CM | POA: Diagnosis not present

## 2021-12-23 DIAGNOSIS — F331 Major depressive disorder, recurrent, moderate: Secondary | ICD-10-CM | POA: Diagnosis not present

## 2021-12-23 DIAGNOSIS — F4001 Agoraphobia with panic disorder: Secondary | ICD-10-CM | POA: Diagnosis not present

## 2021-12-23 DIAGNOSIS — F4312 Post-traumatic stress disorder, chronic: Secondary | ICD-10-CM | POA: Diagnosis not present

## 2021-12-27 ENCOUNTER — Telehealth: Payer: Self-pay

## 2021-12-27 NOTE — Telephone Encounter (Signed)
Mary from psychiatrist office called, [phone], wanted to know if they could prescribe benzodiazepine to pt. Spoke with Ander Purpura, she said yes that's fine as long as they educate the pt on taking a benzo with her current pain meds. Spoke to the office and informed them of above info.

## 2022-01-07 DIAGNOSIS — F331 Major depressive disorder, recurrent, moderate: Secondary | ICD-10-CM | POA: Diagnosis not present

## 2022-01-07 DIAGNOSIS — F4011 Social phobia, generalized: Secondary | ICD-10-CM | POA: Diagnosis not present

## 2022-01-07 DIAGNOSIS — F4001 Agoraphobia with panic disorder: Secondary | ICD-10-CM | POA: Diagnosis not present

## 2022-01-07 DIAGNOSIS — F4312 Post-traumatic stress disorder, chronic: Secondary | ICD-10-CM | POA: Diagnosis not present

## 2022-02-04 DIAGNOSIS — F4001 Agoraphobia with panic disorder: Secondary | ICD-10-CM | POA: Diagnosis not present

## 2022-02-04 DIAGNOSIS — F331 Major depressive disorder, recurrent, moderate: Secondary | ICD-10-CM | POA: Diagnosis not present

## 2022-02-04 DIAGNOSIS — F4312 Post-traumatic stress disorder, chronic: Secondary | ICD-10-CM | POA: Diagnosis not present

## 2022-02-04 DIAGNOSIS — G4700 Insomnia, unspecified: Secondary | ICD-10-CM | POA: Diagnosis not present

## 2022-03-04 DIAGNOSIS — F331 Major depressive disorder, recurrent, moderate: Secondary | ICD-10-CM | POA: Diagnosis not present

## 2022-03-04 DIAGNOSIS — F4001 Agoraphobia with panic disorder: Secondary | ICD-10-CM | POA: Diagnosis not present

## 2022-03-04 DIAGNOSIS — F4011 Social phobia, generalized: Secondary | ICD-10-CM | POA: Diagnosis not present

## 2022-03-04 DIAGNOSIS — F4312 Post-traumatic stress disorder, chronic: Secondary | ICD-10-CM | POA: Diagnosis not present

## 2022-03-06 ENCOUNTER — Encounter: Payer: Self-pay | Admitting: Oncology

## 2022-03-10 ENCOUNTER — Other Ambulatory Visit: Payer: Self-pay

## 2022-03-13 ENCOUNTER — Other Ambulatory Visit: Payer: Self-pay

## 2022-03-13 ENCOUNTER — Encounter: Payer: Self-pay | Admitting: Gastroenterology

## 2022-03-13 ENCOUNTER — Ambulatory Visit (INDEPENDENT_AMBULATORY_CARE_PROVIDER_SITE_OTHER): Payer: Medicare Other | Admitting: Gastroenterology

## 2022-03-13 VITALS — BP 157/100 | HR 87 | Temp 99.5°F | Ht 73.0 in | Wt 194.4 lb

## 2022-03-13 DIAGNOSIS — K6289 Other specified diseases of anus and rectum: Secondary | ICD-10-CM

## 2022-03-13 DIAGNOSIS — D509 Iron deficiency anemia, unspecified: Secondary | ICD-10-CM | POA: Diagnosis not present

## 2022-03-13 DIAGNOSIS — K529 Noninfective gastroenteritis and colitis, unspecified: Secondary | ICD-10-CM | POA: Diagnosis not present

## 2022-03-13 DIAGNOSIS — K2210 Ulcer of esophagus without bleeding: Secondary | ICD-10-CM

## 2022-03-13 DIAGNOSIS — E538 Deficiency of other specified B group vitamins: Secondary | ICD-10-CM | POA: Diagnosis not present

## 2022-03-13 DIAGNOSIS — R109 Unspecified abdominal pain: Secondary | ICD-10-CM

## 2022-03-13 DIAGNOSIS — K259 Gastric ulcer, unspecified as acute or chronic, without hemorrhage or perforation: Secondary | ICD-10-CM

## 2022-03-13 DIAGNOSIS — R1012 Left upper quadrant pain: Secondary | ICD-10-CM

## 2022-03-13 MED ORDER — PANCRELIPASE (LIP-PROT-AMYL) 36000-114000 UNITS PO CPEP: ORAL_CAPSULE | ORAL | 5 refills | Status: DC

## 2022-03-13 MED ORDER — DICYCLOMINE HCL 20 MG PO TABS: 20.0000 mg | ORAL_TABLET | Freq: Three times a day (TID) | ORAL | 1 refills | Status: DC

## 2022-03-13 MED ORDER — GABAPENTIN 100 MG PO CAPS: 200.0000 mg | ORAL_CAPSULE | Freq: Two times a day (BID) | ORAL | 0 refills | Status: DC

## 2022-03-13 MED ORDER — HYDROCORTISONE (PERIANAL) 2.5 % EX CREA: 1.0000 | TOPICAL_CREAM | Freq: Two times a day (BID) | CUTANEOUS | 1 refills | Status: DC

## 2022-03-13 NOTE — Patient Instructions (Addendum)
Increase Creon to 3 capsules with each meal and 1-2 with snack, take after the first bite of each meal 2.  Take omeprazole 20 to 30 minutes before breakfast and 20 to 30 minutes before dinner 3.  Take Bentyl 20 mg twice daily for abdominal cramps 4.  Start new medication called gabapentin 100 mg twice daily for chronic left upper quadrant pain 5.  For your hemorrhoids, apply pea-sized Anusol cream 1 inch per rectum twice daily for 7 to 10 days  Please call our office to speak with my nurse Ulyess Blossom [phone] during business hours from 8am to 4pm if you have any questions/concerns. During after hours, you will be redirected to on call GI physician. For any emergency please call 911 or go the nearest emergency room.    [name], MD [address]  [place]  [place], [place] [address]  Main: [phone]  Fax: [phone]

## 2022-03-13 NOTE — Progress Notes (Signed)
[name], MD [address]  [place]  [place], [place] [address]  Main: [phone]  Fax: [phone] Pager: [phone]   Primary Care Physician: Mylinda Latina, PA-C  Primary Gastroenterologist:  Dr. [name]  Chief Complaint  Patient presents with   Anemia   pancreatic insufficency     HPI: Stacy Pineda is a 41 y.o. female with history of laparoscopic reversal of gastric bypass, partial gastrectomy in 2015 secondary to chronic recurrent marginal ulcer, cholecystectomy is referred to me by Dr. Bonna Gains to evaluate for hemorrhoid ligation due to intermittent rectal bleeding.  Patient underwent colonoscopy for chronic diarrhea and was found to have internal hemorrhoids.  She had random colon biopsies which were unremarkable for any inflammation.  Patient reports ongoing diarrhea.  She is currently on ciprofloxacin started by her PCP for nausea and vomiting, thought to be viral gastroenteritis.  Patient is more concerned about ongoing diarrhea more than rectal bleeding, has postprandial urgency.  She does have chronic iron deficiency anemia, closely followed by hematology for parenteral iron.  Patient has history of PTSD, anxiety and depression and has been going through a lot of health issues.  Her quality of life is significantly disrupted due to her GI symptoms and hoping to find some answers.  She is currently on Remeron, Klonopin, Effexor managed by her psychiatrist  Follow-up visit 07/25/2021 Patient was lost to follow-up since 01/2019.  Patient is here to discuss about chronic symptoms of upper abdominal discomfort associated with abdominal bloating, postprandial urgency and loose stools.  She does report history of nausea, dry heaving but without any vomiting.  Patient has been gaining weight.  She does have known history of chronic iron deficiency anemia.  Patient denies any weight loss, rectal bleeding.  Patient was admitted to [facility] in early October 2022  secondary to abdominal pain, nausea, her serum lipase was in 500s, found to have severe iron deficiency anemia with no evidence of active GI bleed.  She was managed as acute on chronic pancreatitis.  Patient also received iron infusions x 2 at [facility].  Her most recent hemoglobin was 7.9 on 05/23/2021, CV 58.7, no evidence of chronic liver disease.  She underwent CT abdomen and pelvis which revealed normal pancreas.  H. pylori stool antigen negative, normal vitamin D, folate, copper and zinc levels.  She denies smoking marijuana but she does have elevated marijuana to fight with the pain.  Her TSH is normal.  She had EGD and colonoscopy in 2020 which was normal.  Gastric emptying study was normal.  Patient also tells me that she tried pancreatic enzymes in the past with not much relief.  She is requesting if she can have some pain medication  Follow-up visit 11/07/2021 Patient is here for follow-up of chronic diarrhea, iron and B12 deficiency anemia, abdominal pain.  She underwent upper endoscopy which revealed bleeding gastric ulcer, treated with cautery, started omeprazole 40 mg p.o. twice daily which she is taking.  She is taking B12 injections as well as oral folic acid daily.  I also referred her to hematology, does not have an appointment yet.  Her pancreatic fecal elastase levels were less than 50, started her on Creon 36 K 2 capsules with each meal and 1 with snack.  Patient reports that her diarrhea is more or less the same even though the consistency has somewhat improved.  Patient is taking Bentyl 20 mg as needed.  Follow-up visit 03/13/2022 Patient is here for follow-up of  chronic left-sided upper abdominal pain, chronic diarrhea.  Patient is frustrated with ongoing abdominal pain and chronic diarrhea.  Since last visit, she gained approximately 10 pounds.  She continues to take Creon 36 K 2 capsules with each meal and 1 with snack.  She is also taking omeprazole 40 mg twice daily.  She is taking iron,  folic acid as well as Z61 injections.  Patient reports sharp stabbing left-sided upper abdominal pain which is worse after eating symptoms radiating to the back.  She also reports 10-12 episodes of watery bowel movements daily.  She denies any abdominal bloating.  She sometimes take Bentyl as needed which helps with the pain.  Current Outpatient Medications  Medication Sig Dispense Refill   clonazePAM (KLONOPIN) 0.5 MG tablet Take 0.5 mg by mouth 3 (three) times daily.     cyanocobalamin (,VITAMIN B-12,) 1000 MCG/ML injection Inject 1ML every week for 4 weeks and then every other week for 2 months. 4 mL 3   dicyclomine (BENTYL) 20 MG tablet Take 1 tablet (20 mg total) by mouth every 8 (eight) hours. As needed 120 tablet 1   doxepin (SINEQUAN) 10 MG capsule Take 10 mg by mouth at bedtime.     DULoxetine (CYMBALTA) 30 MG capsule Take 60 mg by mouth daily.     DULoxetine (CYMBALTA) 60 MG capsule SMARTSIG:1 Capsule(s) By Mouth Every Evening     folic acid (FOLVITE) 1 MG tablet Take 1 mg by mouth daily.     gabapentin (NEURONTIN) 100 MG capsule Take 2 capsules (200 mg total) by mouth 2 (two) times daily. 120 capsule 0   lipase/protease/amylase (CREON) 36000 UNITS CPEP capsule Take 3 capsules with the first bite of each meal and 2 capsule with the first bite of each snack 330 capsule 5   Multiple Vitamins-Minerals (MULTIVITAMIN ADULT PO) Take 1 tablet by mouth.     omeprazole (PRILOSEC) 40 MG capsule Take 1 capsule (40 mg total) by mouth 2 (two) times daily before a meal. 60 capsule 3   oxyCODONE-acetaminophen (PERCOCET) 10-325 MG tablet Take half to one tab po qd prn for acute and severe pain 30 tablet 0   prazosin (MINIPRESS) 2 MG capsule Take 2 mg by mouth at bedtime.     promethazine (PHENERGAN) 6.25 MG/5ML syrup TAKE  10 ML BY MOUTH EVERY 8 HOURS AS NEEDED FOR NAUSEA AND VOMITING OR  REFRACTORY  NAUSEA/VOMITING 240 mL 2   No current facility-administered medications for this visit.    Allergies  as of 03/13/2022 - Review Complete 03/13/2022  Allergen Reaction Noted   Fentanyl Itching 03/27/2015   Morphine and related Itching 03/27/2015   Vicodin [hydrocodone-acetaminophen] Itching 03/27/2015    NSAIDs: None  Antiplts/Anticoagulants/Anti thrombotics: None  GI procedures:  EGD and colonoscopy 08/29/2021 - Normal duodenal bulb, second portion of the duodenum and third portion of the duodenum. Biopsied. - Oozing gastric ulcer with oozing hemorrhage ([place] Class Ib). Treated with a monopolar probe. - A gastric bypass was found, characterized by healthy appearing mucosa. Biopsied. - LA Grade B reflux esophagitis with no bleeding. - A staple was found in the stomach. Removal was successful.  - Preparation of the colon was poor. - Perianal skin tags found on perianal exam. - The examined portion of the ileum was normal. - Normal mucosa in the entire examined colon. Biopsied. - Stool in the entire examined colon. - The distal rectum and anal verge are normal on retroflexion view.  EGD and colonoscopy 09/08/2018 - Normal gastroesophageal junction  and esophagus. Biopsied. - A, history of gastric bypass and reversal with sutures, were found, characterized by no stomal ulceration and healthy appearing mucosa. - Normal examined duodenum.  - Preparation of the colon was fair. - Hemorrhoids found on perianal exam. - The rectum, sigmoid colon, descending colon, transverse colon, ascending colon and cecum are normal. Biopsied. - Non-bleeding internal hemorrhoids.  DIAGNOSIS:  A. STOMACH, BODY; COLD BIOPSY:  - GASTRIC ANTRAL AND OXYNTIC MUCOSA WITH MILD CHRONIC INACTIVE GASTRITIS  AND FOVEOLAR HYPERPLASIA.  - NEGATIVE FOR H. PYLORI, DYSPLASIA, AND MALIGNANCY.   B. ESOPHAGUS; COLD BIOPSY:  - BENIGN SQUAMOUS MUCOSA WITH NO SIGNIFICANT HISTOPATHOLOGIC CHANGE.  - NO INCREASE IN INTRAEPITHELIAL EOSINOPHILS (LESS THAN 2 PER HPF).  - NEGATIVE FOR DYSPLASIA AND MALIGNANCY.   C. COLON,  RANDOM; COLD BIOPSY:  - BENIGN COLONIC MUCOSA WITH NO SIGNIFICANT HISTOPATHOLOGIC CHANGE.  - NEGATIVE FOR FEATURES OF MICROSCOPIC COLITIS, DYSPLASIA, AND  MALIGNANCY.   ROS:  General: Negative for anorexia, weight loss, fever, chills, fatigue, weakness. ENT: Negative for hoarseness, difficulty swallowing , nasal congestion. CV: Negative for chest pain, angina, palpitations, dyspnea on exertion, peripheral edema.  Respiratory: Negative for dyspnea at rest, dyspnea on exertion, cough, sputum, wheezing.  GI: See history of present illness. GU:  Negative for dysuria, hematuria, urinary incontinence, urinary frequency, nocturnal urination.  Endo: Negative for unusual weight change.    Physical Examination:   BP (!) 157/100 (BP Location: Left Arm, Patient Position: Sitting, Cuff Size: Normal)   Pulse 87   Temp 99.5 F (37.5 C) (Oral)   Ht '6\' 1"'$  (1.854 m)   Wt 194 lb 6 oz (88.2 kg)   BMI 25.64 kg/m   General: Well-nourished, well-developed in no acute distress.  Eyes: No icterus. Conjunctivae pink. Mouth: Oropharyngeal mucosa moist and pink , no lesions erythema or exudate. Lungs: Clear to auscultation bilaterally. Non-labored. Heart: Regular rate and rhythm, no murmurs rubs or gallops.  Abdomen: Bowel sounds are normal, nontender, nondistended, no hepatosplenomegaly or masses, no hernia , no rebound or guarding.   Extremities: No lower extremity edema. No clubbing or deformities. Neuro: Alert and oriented x 3.  Grossly intact. Skin: Warm and dry, no jaundice.   Psych: Alert and cooperative, normal mood and affect.   Imaging Studies: Reviewed  Assessment and Plan:   Stacy Pineda is a 41 y.o. pleasant female history of gastric bypass, reversal, chronic iron deficiency anemia, upper abdominal discomfort, postprandial urgency, abdominal bloating with diarrhea   Left-sided upper abdominal discomfort, worse postprandial  EGD in 08/2021 revealed bleeding gastric ulcer s/p cautery,  surgical staples were removed, LA grade B esophagitis Continue omeprazole 40 mg p.o. twice daily before meals Recommend repeat EGD to confirm healing of the ulcer and esophagitis Continue Bentyl 20 mg 2-3 times daily on a scheduled basis Discussed with patient regarding the possibility of functional abdominal pain and try gabapentin 100 mg twice daily  Chronic nonbloody diarrhea Patient tried Xifaxan for 2 weeks, did not notice any improvement in diarrhea pancreatic fecal elastase levels less than 50 Increase Creon 36 K, 3 capsules with each meal and 1-2 with snack 72 K with each meal and 1 with snack Recommend GI profile PCR given ongoing symptoms of diarrhea to rule out infection Continue Bentyl 20 mg before each meal and at bedtime as needed  History of iron deficiency, B12 and folate deficiency anemia and history of peptic ulcer disease  Continue iron, B12 and folate replacement therapy Recheck CBC, iron panel, B12 and folate levels  today  Rectal pain Colonoscopy revealed perianal skin tags only, probably from small hemorrhoids Recommend Anusol cream twice daily for 7 to 10 days   Follow up in 4-6 months   Dr Sherri Sear, MD

## 2022-03-15 ENCOUNTER — Encounter: Payer: Self-pay | Admitting: Oncology

## 2022-03-15 LAB — CBC
Hematocrit: 40.1 % (ref 34.0–46.6)
Hemoglobin: 12 g/dL (ref 11.1–15.9)
MCH: 22.4 pg — ABNORMAL LOW (ref 26.6–33.0)
MCHC: 29.9 g/dL — ABNORMAL LOW (ref 31.5–35.7)
MCV: 75 fL — ABNORMAL LOW (ref 79–97)
Platelets: 272 10*3/uL (ref 150–450)
RBC: 5.36 x10E6/uL — ABNORMAL HIGH (ref 3.77–5.28)
RDW: 17.3 % — ABNORMAL HIGH (ref 11.7–15.4)
WBC: 5.8 10*3/uL (ref 3.4–10.8)

## 2022-03-15 LAB — IRON,TIBC AND FERRITIN PANEL
Ferritin: 17 ng/mL (ref 15–150)
Iron Saturation: 21 % (ref 15–55)
Iron: 94 ug/dL (ref 27–159)
Total Iron Binding Capacity: 440 ug/dL (ref 250–450)
UIBC: 346 ug/dL (ref 131–425)

## 2022-03-15 LAB — B12 AND FOLATE PANEL
Folate: 4.2 ng/mL (ref >3.0)
Vitamin B-12: 300 pg/mL (ref 232–1245)

## 2022-03-15 LAB — LIPASE: Lipase: 72 U/L (ref 14–72)

## 2022-04-10 ENCOUNTER — Ambulatory Visit: Payer: BC Managed Care – PPO | Admitting: Anesthesiology

## 2022-04-10 ENCOUNTER — Encounter: Payer: Self-pay | Admitting: Gastroenterology

## 2022-04-10 ENCOUNTER — Ambulatory Visit
Admission: RE | Admit: 2022-04-10 | Discharge: 2022-04-10 | Disposition: A | Discharge status: Home / Self Care | Payer: BC Managed Care – PPO | Source: Ambulatory Visit | Attending: Gastroenterology | Admitting: Gastroenterology

## 2022-04-10 ENCOUNTER — Other Ambulatory Visit: Payer: Self-pay

## 2022-04-10 ENCOUNTER — Encounter: Payer: Self-pay | Admitting: Oncology

## 2022-04-10 ENCOUNTER — Encounter
Admission: RE | Disposition: A | Discharge status: Home / Self Care | Payer: Self-pay | Source: Ambulatory Visit | Attending: Gastroenterology

## 2022-04-10 ENCOUNTER — Telehealth: Payer: Self-pay

## 2022-04-10 DIAGNOSIS — Z8719 Personal history of other diseases of the digestive system: Secondary | ICD-10-CM | POA: Insufficient documentation

## 2022-04-10 DIAGNOSIS — K2210 Ulcer of esophagus without bleeding: Secondary | ICD-10-CM | POA: Diagnosis not present

## 2022-04-10 DIAGNOSIS — Z980 Intestinal bypass and anastomosis status: Secondary | ICD-10-CM | POA: Diagnosis not present

## 2022-04-10 DIAGNOSIS — Z8349 Family history of other endocrine, nutritional and metabolic diseases: Secondary | ICD-10-CM | POA: Insufficient documentation

## 2022-04-10 DIAGNOSIS — T182XXA Foreign body in stomach, initial encounter: Secondary | ICD-10-CM | POA: Diagnosis not present

## 2022-04-10 DIAGNOSIS — Z4802 Encounter for removal of sutures: Secondary | ICD-10-CM | POA: Diagnosis not present

## 2022-04-10 DIAGNOSIS — F17210 Nicotine dependence, cigarettes, uncomplicated: Secondary | ICD-10-CM | POA: Diagnosis not present

## 2022-04-10 DIAGNOSIS — K219 Gastro-esophageal reflux disease without esophagitis: Secondary | ICD-10-CM | POA: Insufficient documentation

## 2022-04-10 DIAGNOSIS — J45909 Unspecified asthma, uncomplicated: Secondary | ICD-10-CM | POA: Insufficient documentation

## 2022-04-10 DIAGNOSIS — K449 Diaphragmatic hernia without obstruction or gangrene: Secondary | ICD-10-CM | POA: Insufficient documentation

## 2022-04-10 DIAGNOSIS — K259 Gastric ulcer, unspecified as acute or chronic, without hemorrhage or perforation: Secondary | ICD-10-CM | POA: Diagnosis not present

## 2022-04-10 DIAGNOSIS — Z8711 Personal history of peptic ulcer disease: Secondary | ICD-10-CM | POA: Diagnosis not present

## 2022-04-10 DIAGNOSIS — T7840XA Allergy, unspecified, initial encounter: Secondary | ICD-10-CM | POA: Diagnosis not present

## 2022-04-10 DIAGNOSIS — Z8249 Family history of ischemic heart disease and other diseases of the circulatory system: Secondary | ICD-10-CM | POA: Diagnosis not present

## 2022-04-10 DIAGNOSIS — G43909 Migraine, unspecified, not intractable, without status migrainosus: Secondary | ICD-10-CM | POA: Insufficient documentation

## 2022-04-10 DIAGNOSIS — R1013 Epigastric pain: Secondary | ICD-10-CM

## 2022-04-10 DIAGNOSIS — I10 Essential (primary) hypertension: Secondary | ICD-10-CM | POA: Diagnosis not present

## 2022-04-10 DIAGNOSIS — E039 Hypothyroidism, unspecified: Secondary | ICD-10-CM | POA: Diagnosis not present

## 2022-04-10 HISTORY — PX: ESOPHAGOGASTRODUODENOSCOPY (EGD) WITH PROPOFOL: SHX5813

## 2022-04-10 LAB — POCT PREGNANCY, URINE: Preg Test, Ur: NEGATIVE

## 2022-04-10 SURGERY — ESOPHAGOGASTRODUODENOSCOPY (EGD) WITH PROPOFOL
Anesthesia: General

## 2022-04-10 MED ORDER — SODIUM CHLORIDE 0.9 % IV SOLN: INTRAVENOUS | Status: DC

## 2022-04-10 MED ORDER — OMEPRAZOLE 2 MG/ML ORAL SUSPENSION: 40.0000 mg | Freq: Two times a day (BID) | ORAL | 0 refills | Status: DC

## 2022-04-10 MED ORDER — LIDOCAINE HCL (CARDIAC) PF 100 MG/5ML IV SOSY
PREFILLED_SYRINGE | INTRAVENOUS | Status: DC | PRN
  Administered 2022-04-10: 50 mg via INTRAVENOUS

## 2022-04-10 MED ORDER — PROPOFOL 10 MG/ML IV BOLUS
INTRAVENOUS | Status: DC | PRN
  Administered 2022-04-10: 50 mg via INTRAVENOUS
  Administered 2022-04-10: 40 mg via INTRAVENOUS

## 2022-04-10 MED ORDER — PROPOFOL 500 MG/50ML IV EMUL
INTRAVENOUS | Status: DC | PRN
  Administered 2022-04-10: 200 ug/kg/min via INTRAVENOUS

## 2022-04-10 NOTE — Transfer of Care (Signed)
Immediate Anesthesia Transfer of Care Note  Patient: Stacy Pineda  Procedure(s) Performed: ESOPHAGOGASTRODUODENOSCOPY (EGD) WITH PROPOFOL  Patient Location: PACU  Anesthesia Type:General  Level of Consciousness: sedated  Airway & Oxygen Therapy: Patient Spontanous Breathing and Patient connected to nasal cannula oxygen  Post-op Assessment: Report given to RN and Post -op Vital signs reviewed and stable  Post vital signs: Reviewed and stable  Last Vitals:  Vitals Value Taken Time  BP 116/87 04/10/22 1009  Temp    Pulse 98 04/10/22 1008  Resp 21 04/10/22 1009  SpO2 100 % 04/10/22 1008  Vitals shown include unvalidated device data.  Last Pain:  Vitals:   04/10/22 0856  TempSrc: Temporal  PainSc: 6          Complications: No notable events documented.

## 2022-04-10 NOTE — Op Note (Signed)
[facility]astroenterology Patient Name: Stacy Pineda Procedure Date: 04/10/2022 9:41 AM MRN: [id number] Account #: [id number] Date of Birth: February 22, 1981 Admit Type: Outpatient Age: 41 Room: [facility] ENDO ROOM 4 Gender: Female Note Status: Finalized Instrument Name: Upper Endoscope [id number] Procedure:             Upper GI endoscopy Indications:           Epigastric abdominal pain, Follow-up of gastrojejunal                         ulcer, Follow-up of esophagitis Providers:             Lin Landsman MD, MD Referring MD:          Mylinda Latina (Referring MD) Medicines:             General Anesthesia Complications:         No immediate complications. Estimated blood loss: None. Procedure:             Pre-Anesthesia Assessment:                        - Prior to the procedure, a History and Physical was                         performed, and patient medications and allergies were                         reviewed. The patient is competent. The risks and                         benefits of the procedure and the sedation options and                         risks were discussed with the patient. All questions                         were answered and informed consent was obtained.                         Patient identification and proposed procedure were                         verified by the physician, the nurse, the                         anesthesiologist, the anesthetist and the technician                         in the pre-procedure area in the procedure room in the                         endoscopy suite. Mental Status Examination: alert and                         oriented. Airway Examination: normal oropharyngeal                         airway and neck mobility. Respiratory Examination:  clear to auscultation. CV Examination: normal.                         Prophylactic Antibiotics: The patient does not require                          prophylactic antibiotics. Prior Anticoagulants: The                         patient has taken no previous anticoagulant or                         antiplatelet agents. ASA Grade Assessment: II - A                         patient with mild systemic disease. After reviewing                         the risks and benefits, the patient was deemed in                         satisfactory condition to undergo the procedure. The                         anesthesia plan was to use general anesthesia.                         Immediately prior to administration of medications,                         the patient was re-assessed for adequacy to receive                         sedatives. The heart rate, respiratory rate, oxygen                         saturations, blood pressure, adequacy of pulmonary                         ventilation, and response to care were monitored                         throughout the procedure. The physical status of the                         patient was re-assessed after the procedure.                        After obtaining informed consent, the endoscope was                         passed under direct vision. Throughout the procedure,                         the patient's blood pressure, pulse, and oxygen                         saturations were monitored continuously. The  Endosonoscope was introduced through the mouth, and                         advanced to the afferent jejunal loop. The upper GI                         endoscopy was accomplished without difficulty. The                         patient tolerated the procedure well. Findings:      Evidence of a Roux-en-Y gastrojejunostomy was found. The gastrojejunal       anastomosis was characterized by congestion. This was traversed. The       pouch-to-jejunum limb was characterized by healthy appearing mucosa. The       jejunojejunal anastomosis was characterized by healthy appearing mucosa.       A staple was found at the anastomosis. Removal was accomplished with a       regular forceps. Estimated blood loss: none.      The gastroesophageal junction and examined esophagus were normal.      A 1 cm hiatal hernia was present. Impression:            - Roux-en-Y gastrojejunostomy with gastrojejunal                         anastomosis characterized by congestion.                        - A staple was found in the stomach. Removal was                         successful.                        - Normal gastroesophageal junction and esophagus.                        - 1 cm hiatal hernia. Recommendation:        - Discharge patient to home (with escort).                        - Resume previous diet today.                        - Continue present medications.                        - Return to my office as previously scheduled. Procedure Code(s):     --- Professional ---                        [phone], Esophagogastroduodenoscopy, flexible,                         transoral; with removal of foreign body(s) Diagnosis Code(s):     --- Professional ---                        Z98.0, Intestinal bypass and anastomosis status  T18.2XXA, Foreign body in stomach, initial encounter                        K44.9, Diaphragmatic hernia without obstruction or                         gangrene                        R10.13, Epigastric pain                        K20.90, Esophagitis, unspecified without bleeding                        K28.9, Gastrojejunal ulcer, unspecified as acute or                         chronic, without hemorrhage or perforation CPT copyright 2019 American [employer] Association. All rights reserved. The codes documented in this report are preliminary and upon coder review may  be revised to meet current compliance requirements. Dr. Ulyess Mort Lin Landsman MD, MD 04/10/2022 10:12:10 AM This report has been signed electronically. Number of Addenda: 0 Note  Initiated On: 04/10/2022 9:41 AM Estimated Blood Loss:  Estimated blood loss: none.      [facility]

## 2022-04-10 NOTE — Telephone Encounter (Signed)
I only see the omeprazole suspension in '2mg'$ . Please advised

## 2022-04-10 NOTE — Anesthesia Preprocedure Evaluation (Signed)
Anesthesia Evaluation  Patient identified by MRN, date of birth, ID band Patient awake    Reviewed: Allergy & Precautions, NPO status , Patient's Chart, lab work & pertinent test results  Airway Mallampati: III  TM Distance: >3 FB Neck ROM: full    Dental  (+) Chipped, Poor Dentition   Pulmonary asthma , Current Smoker and Patient abstained from smoking.,    Pulmonary exam normal        Cardiovascular hypertension, (-) anginaNormal cardiovascular exam     Neuro/Psych  Headaches, PSYCHIATRIC DISORDERS    GI/Hepatic Neg liver ROS, PUD, GERD  Controlled,  Endo/Other  Hypothyroidism   Renal/GU negative Renal ROS  negative genitourinary   Musculoskeletal   Abdominal   Peds  Hematology negative hematology ROS (+)   Anesthesia Other Findings Past Medical History: No date: Anxiety No date: Asthma No date: Bilateral ovarian cysts No date: Chronic headaches     Comment:  migraines about 1x/month No date: Depression 07/2014: DVT (deep venous thrombosis) ([facility])     Comment:  right upper extremity No date: Hypothyroidism No date: Pancreatitis No date: Pancreatitis No date: PTSD (post-traumatic stress disorder) No date: Wears contact lenses  Past Surgical History: No date: ABDOMINAL SURGERY 04/27/2020: BREAST BIOPSY; Right     Comment:  stereo bx, coil clip, path pending  No date: CHOLECYSTECTOMY 09/08/2018: COLONOSCOPY WITH PROPOFOL; N/A     Comment:  Procedure: COLONOSCOPY WITH PROPOFOL;  Surgeon:               Virgel Manifold, MD;  Location: [facility] ENDOSCOPY;                Service: Endoscopy;  Laterality: N/A; 08/29/2021: COLONOSCOPY WITH PROPOFOL; N/A     Comment:  Procedure: COLONOSCOPY WITH PROPOFOL;  Surgeon: Lin Landsman, MD;  Location: [place];                Service: Endoscopy;  Laterality: N/A; No date: DECUBITUS ULCER EXCISION No date: DILATION AND CURETTAGE OF  UTERUS 09/08/2018: ESOPHAGOGASTRODUODENOSCOPY (EGD) WITH PROPOFOL; N/A     Comment:  Procedure: ESOPHAGOGASTRODUODENOSCOPY (EGD) WITH               PROPOFOL;  Surgeon: Virgel Manifold, MD;  Location:               [facility] ENDOSCOPY;  Service: Endoscopy;  Laterality: N/A; 08/29/2021: ESOPHAGOGASTRODUODENOSCOPY (EGD) WITH PROPOFOL; N/A     Comment:  Procedure: ESOPHAGOGASTRODUODENOSCOPY (EGD) WITH               PROPOFOL;  Surgeon: Lin Landsman, MD;  Location:               [place];  Service: Endoscopy;  Laterality:               N/A; No date: GASTRIC BYPASS No date: GASTRIC BYPASS No date: REVISION GASTRIC RESTRICTIVE PROCEDURE FOR MORBID OBESITY  BMI    Body Mass Index: 25.77 kg/m      Reproductive/Obstetrics negative OB ROS                             Anesthesia Physical Anesthesia Plan  ASA: 3  Anesthesia Plan: General   Post-op Pain Management:    Induction: Intravenous  PONV Risk Score and Plan: Propofol infusion and TIVA  Airway Management Planned: Natural Airway  and Nasal Cannula  Additional Equipment:   Intra-op Plan:   Post-operative Plan:   Informed Consent: I have reviewed the patients History and Physical, chart, labs and discussed the procedure including the risks, benefits and alternatives for the proposed anesthesia with the patient or authorized representative who has indicated his/her understanding and acceptance.     Dental Advisory Given  Plan Discussed with: Anesthesiologist, CRNA and Surgeon  Anesthesia Plan Comments: (Patient consented for risks of anesthesia including but not limited to:  - adverse reactions to medications - risk of airway placement if required - damage to eyes, teeth, lips or other oral mucosa - nerve damage due to positioning  - sore throat or hoarseness - Damage to heart, brain, nerves, lungs, other parts of body or loss of life  Patient voiced understanding.)         Anesthesia Quick Evaluation

## 2022-04-10 NOTE — Telephone Encounter (Signed)
-----   Message from Lin Landsman, MD sent at 04/10/2022 12:50 PM EDT ----- Regarding: meds Please send her liquid prilosec '40mg'$  PO BID before meals, she refers liquid, 3 months, 3 refills  Thanks  RV

## 2022-04-10 NOTE — H&P (Signed)
[name], MD [address]  [place]  [place], [place] [address]  Main: [phone]  Fax: [phone] Pager: [phone]  Primary Care Physician:  Mylinda Latina, PA-C Primary Gastroenterologist:  Dr. [name]  Pre-Procedure History & Physical: HPI:  Stacy Pineda is a 41 y.o. female is here for an EGD   Past Medical History:  Diagnosis Date   Anxiety    Asthma    Bilateral ovarian cysts    Chronic headaches    migraines about 1x/month   Depression    DVT (deep venous thrombosis) ([place]) 07/2014   right upper extremity   Hypothyroidism    Pancreatitis    Pancreatitis    PTSD (post-traumatic stress disorder)    Wears contact lenses     Past Surgical History:  Procedure Laterality Date   ABDOMINAL SURGERY     BREAST BIOPSY Right 04/27/2020   stereo bx, coil clip, path pending    CHOLECYSTECTOMY     COLONOSCOPY WITH PROPOFOL N/A 09/08/2018   Procedure: COLONOSCOPY WITH PROPOFOL;  Surgeon: Virgel Manifold, MD;  Location: [facility] ENDOSCOPY;  Service: Endoscopy;  Laterality: N/A;   COLONOSCOPY WITH PROPOFOL N/A 08/29/2021   Procedure: COLONOSCOPY WITH PROPOFOL;  Surgeon: Lin Landsman, MD;  Location: [place];  Service: Endoscopy;  Laterality: N/A;   DECUBITUS ULCER EXCISION     DILATION AND CURETTAGE OF UTERUS     ESOPHAGOGASTRODUODENOSCOPY (EGD) WITH PROPOFOL N/A 09/08/2018   Procedure: ESOPHAGOGASTRODUODENOSCOPY (EGD) WITH PROPOFOL;  Surgeon: Virgel Manifold, MD;  Location: [facility] ENDOSCOPY;  Service: Endoscopy;  Laterality: N/A;   ESOPHAGOGASTRODUODENOSCOPY (EGD) WITH PROPOFOL N/A 08/29/2021   Procedure: ESOPHAGOGASTRODUODENOSCOPY (EGD) WITH PROPOFOL;  Surgeon: Lin Landsman, MD;  Location: [place];  Service: Endoscopy;  Laterality: N/A;   GASTRIC BYPASS     GASTRIC BYPASS     REVISION GASTRIC RESTRICTIVE PROCEDURE FOR MORBID OBESITY      Prior to Admission medications   Medication Sig Start Date End Date  Taking? Authorizing Provider  clonazePAM (KLONOPIN) 0.5 MG tablet Take 0.5 mg by mouth 3 (three) times daily. 02/14/22  Yes [provider]  cyanocobalamin (,VITAMIN B-12,) 1000 MCG/ML injection Inject 1ML every week for 4 weeks and then every other week for 2 months. 11/11/21  Yes Rachna Schonberger, Tally Due, MD  dicyclomine (BENTYL) 20 MG tablet Take 1 tablet (20 mg total) by mouth every 8 (eight) hours. As needed 03/13/22 04/12/22 Yes Franceska Strahm, Tally Due, MD  doxepin (SINEQUAN) 10 MG capsule Take 10 mg by mouth at bedtime. 02/04/22  Yes [provider]  DULoxetine (CYMBALTA) 30 MG capsule Take 60 mg by mouth daily.   Yes [provider]  DULoxetine (CYMBALTA) 60 MG capsule SMARTSIG:1 Capsule(s) By Mouth Every Evening 10/21/21  Yes [provider]  folic acid (FOLVITE) 1 MG tablet Take 1 mg by mouth daily. 01/21/22  Yes [provider]  gabapentin (NEURONTIN) 100 MG capsule Take 2 capsules (200 mg total) by mouth 2 (two) times daily. 03/13/22 04/12/22 Yes Janiah Devinney, Tally Due, MD  hydrocortisone (ANUSOL-HC) 2.5 % rectal cream Place 1 Application rectally 2 (two) times daily. 03/13/22  Yes Nadia Torr, Tally Due, MD  lipase/protease/amylase (CREON) 36000 UNITS CPEP capsule Take 3 capsules with the first bite of each meal and 2 capsule with the first bite of each snack 03/13/22  Yes Iva Posten, Tally Due, MD  Multiple Vitamins-Minerals (MULTIVITAMIN ADULT PO) Take 1 tablet by mouth.   Yes [provider]  omeprazole (  PRILOSEC) 40 MG capsule Take 1 capsule (40 mg total) by mouth 2 (two) times daily before a meal. 08/29/21 04/10/22 Yes Renji Berwick, Tally Due, MD  oxyCODONE-acetaminophen (PERCOCET) 10-325 MG tablet Take half to one tab po qd prn for acute and severe pain 10/31/21  Yes McDonough, Lauren K, PA-C  prazosin (MINIPRESS) 2 MG capsule Take 2 mg by mouth at bedtime. 10/21/21  Yes [provider]  promethazine (PHENERGAN) 6.25 MG/5ML syrup TAKE  10 ML BY MOUTH EVERY 8 HOURS  AS NEEDED FOR NAUSEA AND VOMITING OR  REFRACTORY  NAUSEA/VOMITING 07/11/21  Yes Lavera Guise, MD    Allergies as of 03/13/2022 - Review Complete 03/13/2022  Allergen Reaction Noted   Fentanyl Itching 03/27/2015   Morphine and related Itching 03/27/2015   Vicodin [hydrocodone-acetaminophen] Itching 03/27/2015    Family History  Problem Relation Age of Onset   Graves' disease Mother    Hypertension Mother    Hyperlipidemia Mother    Anxiety disorder Father    Prostate cancer Father    Depression Brother    Anxiety disorder Brother    Anxiety disorder Maternal Aunt    Depression Maternal Aunt    Anxiety disorder Paternal Aunt    Depression Paternal Aunt    Breast cancer Paternal Aunt    Anxiety disorder Maternal Uncle    Depression Maternal Uncle    Anxiety disorder Paternal Uncle    Depression Paternal Uncle    Anxiety disorder Maternal Grandfather    Depression Maternal Grandfather    Diabetes Maternal Grandfather    Anxiety disorder Maternal Grandmother    Depression Maternal Grandmother    Breast cancer Maternal Grandmother    Ovarian cancer Maternal Grandmother    Diabetes Maternal Grandmother    Colon cancer Neg Hx     Social History   Socioeconomic History   Marital status: Single    Spouse name: Not on file   Number of children: Not on file   Years of education: Not on file   Highest education level: Not on file  Occupational History   Not on file  Tobacco Use   Smoking status: Some Days    Packs/day: 0.25    Types: Cigarettes    Start date: 03/26/2005   Smokeless tobacco: Never   Tobacco comments:    2 cigarettes daily  Vaping Use   Vaping Use: Never used  Substance and Sexual Activity   Alcohol use: No    Alcohol/week: 0.0 standard drinks of alcohol   Drug use: Yes    Frequency: 3.0 times per week    Types: Marijuana    Comment: last used 2 weeks ago   Sexual activity: Not Currently    Birth control/protection: Injection  Other Topics Concern    Not on file  Social History Narrative   Not on file   Social Determinants of Health   Financial Resource Strain: Not on file  Food Insecurity: Not on file  Transportation Needs: Not on file  Physical Activity: Not on file  Stress: Not on file  Social Connections: Not on file  Intimate Partner Violence: Not on file    Review of Systems: See HPI, otherwise negative ROS  Physical Exam: BP (!) 129/90   Pulse 80   Temp (!) 97.5 F (36.4 C) (Temporal)   Resp 18   Ht 6' (1.829 m)   Wt 86.2 kg   LMP 03/20/2022 (Approximate)   SpO2 100%   BMI 25.77 kg/m  General:   Alert,  pleasant and cooperative in NAD Head:  Normocephalic and atraumatic. Neck:  Supple; no masses or thyromegaly. Lungs:  Clear throughout to auscultation.    Heart:  Regular rate and rhythm. Abdomen:  Soft, nontender and nondistended. Normal bowel sounds, without guarding, and without rebound.   Neurologic:  Alert and  oriented x4;  grossly normal neurologically.  Impression/Plan: Karole A Pineda is here for an EGD to be performed for h/o gastric ulcer  Risks, benefits, limitations, and alternatives regarding  endoscopy have been reviewed with the patient.  Questions have been answered.  All parties agreeable.   Sherri Sear, MD  04/10/2022, 9:09 AM

## 2022-04-10 NOTE — Anesthesia Postprocedure Evaluation (Signed)
Anesthesia Post Note  Patient: Stacy Pineda  Procedure(s) Performed: ESOPHAGOGASTRODUODENOSCOPY (EGD) WITH PROPOFOL  Patient location during evaluation: Endoscopy Anesthesia Type: General Level of consciousness: awake and alert Pain management: pain level controlled Vital Signs Assessment: post-procedure vital signs reviewed and stable Respiratory status: spontaneous breathing, nonlabored ventilation, respiratory function stable and patient connected to nasal cannula oxygen Cardiovascular status: blood pressure returned to baseline and stable Postop Assessment: no apparent nausea or vomiting Anesthetic complications: no   No notable events documented.   Last Vitals:  Vitals:   04/10/22 1038 04/10/22 1049  BP: 116/88   Pulse:    Resp:    Temp:    SpO2:  99%    Last Pain:  Vitals:   04/10/22 1049  TempSrc:   PainSc: 5                  Precious Haws Eder Macek

## 2022-04-11 ENCOUNTER — Encounter: Payer: Self-pay | Admitting: Gastroenterology

## 2022-04-15 ENCOUNTER — Telehealth: Payer: Self-pay

## 2022-04-15 DIAGNOSIS — F4001 Agoraphobia with panic disorder: Secondary | ICD-10-CM | POA: Diagnosis not present

## 2022-04-15 DIAGNOSIS — F4312 Post-traumatic stress disorder, chronic: Secondary | ICD-10-CM | POA: Diagnosis not present

## 2022-04-15 DIAGNOSIS — F331 Major depressive disorder, recurrent, moderate: Secondary | ICD-10-CM | POA: Diagnosis not present

## 2022-04-15 DIAGNOSIS — F4011 Social phobia, generalized: Secondary | ICD-10-CM | POA: Diagnosis not present

## 2022-04-15 MED ORDER — OMEPRAZOLE 40 MG PO CPDR: 40.0000 mg | DELAYED_RELEASE_CAPSULE | Freq: Every day | ORAL | 3 refills | Status: AC

## 2022-04-15 NOTE — Telephone Encounter (Signed)
Please inform patient of the price and she should continue omeprazole pills at this time.  Please refill omeprazole 40 mg once a day before breakfast for 90 days and 3 refills if she does not have any pills left  RV

## 2022-04-15 NOTE — Telephone Encounter (Signed)
Called patient and left a detail message for patient. Sent omeprazole to the pharmacy

## 2022-04-15 NOTE — Addendum Note (Signed)
Addended by: Ulyess Blossom L on: 04/15/2022 02:19 PM   Modules accepted: Orders

## 2022-04-15 NOTE — Telephone Encounter (Signed)
[facility] called and states the liquid omeprazole is 1600 dollars on her insurance and is needing to be change to something cheaper

## 2022-05-13 DIAGNOSIS — F4001 Agoraphobia with panic disorder: Secondary | ICD-10-CM | POA: Diagnosis not present

## 2022-05-13 DIAGNOSIS — F4312 Post-traumatic stress disorder, chronic: Secondary | ICD-10-CM | POA: Diagnosis not present

## 2022-05-13 DIAGNOSIS — F4011 Social phobia, generalized: Secondary | ICD-10-CM | POA: Diagnosis not present

## 2022-05-13 DIAGNOSIS — F331 Major depressive disorder, recurrent, moderate: Secondary | ICD-10-CM | POA: Diagnosis not present

## 2022-07-10 ENCOUNTER — Other Ambulatory Visit: Payer: Self-pay

## 2022-07-15 ENCOUNTER — Ambulatory Visit: Payer: Medicare Other | Admitting: Gastroenterology

## 2022-07-15 DIAGNOSIS — F4312 Post-traumatic stress disorder, chronic: Secondary | ICD-10-CM | POA: Diagnosis not present

## 2022-07-15 DIAGNOSIS — F331 Major depressive disorder, recurrent, moderate: Secondary | ICD-10-CM | POA: Diagnosis not present

## 2022-07-15 DIAGNOSIS — F4001 Agoraphobia with panic disorder: Secondary | ICD-10-CM | POA: Diagnosis not present

## 2022-07-15 DIAGNOSIS — F4011 Social phobia, generalized: Secondary | ICD-10-CM | POA: Diagnosis not present

## 2022-08-13 DIAGNOSIS — F4011 Social phobia, generalized: Secondary | ICD-10-CM | POA: Diagnosis not present

## 2022-08-13 DIAGNOSIS — F4001 Agoraphobia with panic disorder: Secondary | ICD-10-CM | POA: Diagnosis not present

## 2022-08-13 DIAGNOSIS — F331 Major depressive disorder, recurrent, moderate: Secondary | ICD-10-CM | POA: Diagnosis not present

## 2022-08-13 DIAGNOSIS — F4312 Post-traumatic stress disorder, chronic: Secondary | ICD-10-CM | POA: Diagnosis not present

## 2022-09-10 DIAGNOSIS — F4001 Agoraphobia with panic disorder: Secondary | ICD-10-CM | POA: Diagnosis not present

## 2022-09-10 DIAGNOSIS — F4312 Post-traumatic stress disorder, chronic: Secondary | ICD-10-CM | POA: Diagnosis not present

## 2022-09-10 DIAGNOSIS — F331 Major depressive disorder, recurrent, moderate: Secondary | ICD-10-CM | POA: Diagnosis not present

## 2022-09-10 DIAGNOSIS — G4700 Insomnia, unspecified: Secondary | ICD-10-CM | POA: Diagnosis not present

## 2022-10-04 ENCOUNTER — Encounter: Payer: Self-pay | Admitting: Oncology

## 2022-10-08 DIAGNOSIS — F4001 Agoraphobia with panic disorder: Secondary | ICD-10-CM | POA: Diagnosis not present

## 2022-10-08 DIAGNOSIS — F4312 Post-traumatic stress disorder, chronic: Secondary | ICD-10-CM | POA: Diagnosis not present

## 2022-10-08 DIAGNOSIS — F4011 Social phobia, generalized: Secondary | ICD-10-CM | POA: Diagnosis not present

## 2022-10-08 DIAGNOSIS — F331 Major depressive disorder, recurrent, moderate: Secondary | ICD-10-CM | POA: Diagnosis not present

## 2022-10-27 ENCOUNTER — Other Ambulatory Visit: Payer: Self-pay | Admitting: Physician Assistant

## 2022-10-27 ENCOUNTER — Telehealth: Payer: Self-pay

## 2022-10-27 DIAGNOSIS — R946 Abnormal results of thyroid function studies: Secondary | ICD-10-CM

## 2022-10-27 DIAGNOSIS — E782 Mixed hyperlipidemia: Secondary | ICD-10-CM

## 2022-10-27 DIAGNOSIS — D509 Iron deficiency anemia, unspecified: Secondary | ICD-10-CM

## 2022-10-27 DIAGNOSIS — E559 Vitamin D deficiency, unspecified: Secondary | ICD-10-CM

## 2022-10-27 DIAGNOSIS — E538 Deficiency of other specified B group vitamins: Secondary | ICD-10-CM

## 2022-10-27 DIAGNOSIS — R5383 Other fatigue: Secondary | ICD-10-CM

## 2022-10-27 NOTE — Telephone Encounter (Signed)
Patient notified

## 2022-10-29 DIAGNOSIS — F4312 Post-traumatic stress disorder, chronic: Secondary | ICD-10-CM | POA: Diagnosis not present

## 2022-10-29 DIAGNOSIS — F4011 Social phobia, generalized: Secondary | ICD-10-CM | POA: Diagnosis not present

## 2022-10-29 DIAGNOSIS — F4001 Agoraphobia with panic disorder: Secondary | ICD-10-CM | POA: Diagnosis not present

## 2022-10-29 DIAGNOSIS — F331 Major depressive disorder, recurrent, moderate: Secondary | ICD-10-CM | POA: Diagnosis not present

## 2022-10-31 DIAGNOSIS — E538 Deficiency of other specified B group vitamins: Secondary | ICD-10-CM | POA: Diagnosis not present

## 2022-10-31 DIAGNOSIS — E559 Vitamin D deficiency, unspecified: Secondary | ICD-10-CM | POA: Diagnosis not present

## 2022-10-31 DIAGNOSIS — E782 Mixed hyperlipidemia: Secondary | ICD-10-CM | POA: Diagnosis not present

## 2022-10-31 DIAGNOSIS — D509 Iron deficiency anemia, unspecified: Secondary | ICD-10-CM | POA: Diagnosis not present

## 2022-10-31 DIAGNOSIS — R946 Abnormal results of thyroid function studies: Secondary | ICD-10-CM | POA: Diagnosis not present

## 2022-10-31 DIAGNOSIS — R5383 Other fatigue: Secondary | ICD-10-CM | POA: Diagnosis not present

## 2022-11-01 LAB — CBC WITH DIFFERENTIAL/PLATELET
Basophils Absolute: 0.1 10*3/uL (ref 0.0–0.2)
Basos: 1 %
EOS (ABSOLUTE): 0.2 10*3/uL (ref 0.0–0.4)
Eos: 4 %
Hematocrit: 39.5 % (ref 34.0–46.6)
Hemoglobin: 11.4 g/dL (ref 11.1–15.9)
Immature Grans (Abs): 0 10*3/uL (ref 0.0–0.1)
Immature Granulocytes: 0 %
Lymphocytes Absolute: 2.5 10*3/uL (ref 0.7–3.1)
Lymphs: 49 %
MCH: 19.3 pg — ABNORMAL LOW (ref 26.6–33.0)
MCHC: 28.9 g/dL — ABNORMAL LOW (ref 31.5–35.7)
MCV: 67 fL — ABNORMAL LOW (ref 79–97)
Monocytes Absolute: 0.3 10*3/uL (ref 0.1–0.9)
Monocytes: 7 %
Neutrophils Absolute: 2 10*3/uL (ref 1.4–7.0)
Neutrophils: 39 %
Platelets: 256 10*3/uL (ref 150–450)
RBC: 5.92 x10E6/uL — ABNORMAL HIGH (ref 3.77–5.28)
RDW: 20.1 % — ABNORMAL HIGH (ref 11.7–15.4)
WBC: 5.1 10*3/uL (ref 3.4–10.8)

## 2022-11-01 LAB — TSH+FREE T4
Free T4: 0.93 ng/dL (ref 0.82–1.77)
TSH: 0.98 u[IU]/mL (ref 0.450–4.500)

## 2022-11-01 LAB — COMPREHENSIVE METABOLIC PANEL
ALT: 14 IU/L (ref 0–32)
AST: 15 IU/L (ref 0–40)
Albumin/Globulin Ratio: 1.5 (ref 1.2–2.2)
Albumin: 4.2 g/dL (ref 3.9–4.9)
Alkaline Phosphatase: 143 IU/L — ABNORMAL HIGH (ref 44–121)
BUN/Creatinine Ratio: 18 (ref 9–23)
BUN: 10 mg/dL (ref 6–24)
Bilirubin Total: 0.3 mg/dL (ref 0.0–1.2)
CO2: 20 mmol/L (ref 20–29)
Calcium: 9.4 mg/dL (ref 8.7–10.2)
Chloride: 107 mmol/L — ABNORMAL HIGH (ref 96–106)
Creatinine, Ser: 0.57 mg/dL (ref 0.57–1.00)
Globulin, Total: 2.8 g/dL (ref 1.5–4.5)
Glucose: 82 mg/dL (ref 70–99)
Potassium: 4.3 mmol/L (ref 3.5–5.2)
Sodium: 145 mmol/L — ABNORMAL HIGH (ref 134–144)
Total Protein: 7 g/dL (ref 6.0–8.5)
eGFR: 115 mL/min/{1.73_m2} (ref >59)

## 2022-11-01 LAB — IRON,TIBC AND FERRITIN PANEL
Ferritin: 12 ng/mL — ABNORMAL LOW (ref 15–150)
Iron Saturation: 6 % — CL (ref 15–55)
Iron: 29 ug/dL (ref 27–159)
Total Iron Binding Capacity: 470 ug/dL — ABNORMAL HIGH (ref 250–450)
UIBC: 441 ug/dL — ABNORMAL HIGH (ref 131–425)

## 2022-11-01 LAB — LIPID PANEL WITH LDL/HDL RATIO
Cholesterol, Total: 153 mg/dL (ref 100–199)
HDL: 75 mg/dL (ref >39)
LDL Chol Calc (NIH): 66 mg/dL (ref 0–99)
LDL/HDL Ratio: 0.9 ratio (ref 0.0–3.2)
Triglycerides: 56 mg/dL (ref 0–149)
VLDL Cholesterol Cal: 12 mg/dL (ref 5–40)

## 2022-11-01 LAB — VITAMIN D 25 HYDROXY (VIT D DEFICIENCY, FRACTURES): Vit D, 25-Hydroxy: 5.6 ng/mL — ABNORMAL LOW (ref 30.0–100.0)

## 2022-11-01 LAB — B12 AND FOLATE PANEL
Folate: 2.7 ng/mL — ABNORMAL LOW (ref >3.0)
Vitamin B-12: 282 pg/mL (ref 232–1245)

## 2022-11-03 ENCOUNTER — Ambulatory Visit (INDEPENDENT_AMBULATORY_CARE_PROVIDER_SITE_OTHER): Payer: BC Managed Care – PPO | Admitting: Physician Assistant

## 2022-11-03 ENCOUNTER — Encounter: Payer: Self-pay | Admitting: Physician Assistant

## 2022-11-03 VITALS — BP 146/104 | Temp 98.3°F | Resp 16 | Ht 73.0 in | Wt 188.6 lb

## 2022-11-03 DIAGNOSIS — R053 Chronic cough: Secondary | ICD-10-CM | POA: Diagnosis not present

## 2022-11-03 DIAGNOSIS — E611 Iron deficiency: Secondary | ICD-10-CM

## 2022-11-03 DIAGNOSIS — R030 Elevated blood-pressure reading, without diagnosis of hypertension: Secondary | ICD-10-CM | POA: Diagnosis not present

## 2022-11-03 DIAGNOSIS — E559 Vitamin D deficiency, unspecified: Secondary | ICD-10-CM

## 2022-11-03 DIAGNOSIS — Z0001 Encounter for general adult medical examination with abnormal findings: Secondary | ICD-10-CM

## 2022-11-03 DIAGNOSIS — R300 Dysuria: Secondary | ICD-10-CM | POA: Diagnosis not present

## 2022-11-03 DIAGNOSIS — R112 Nausea with vomiting, unspecified: Secondary | ICD-10-CM

## 2022-11-03 DIAGNOSIS — U099 Post covid-19 condition, unspecified: Secondary | ICD-10-CM

## 2022-11-03 MED ORDER — PROMETHAZINE HCL 6.25 MG/5ML PO SYRP: ORAL_SOLUTION | ORAL | 2 refills | Status: DC

## 2022-11-03 MED ORDER — FOLIC ACID 1 MG PO TABS: 1.0000 mg | ORAL_TABLET | Freq: Every day | ORAL | 1 refills | Status: DC

## 2022-11-03 MED ORDER — CYANOCOBALAMIN 1000 MCG/ML IJ SOLN: INTRAMUSCULAR | 3 refills | Status: DC

## 2022-11-03 MED ORDER — ERGOCALCIFEROL 1.25 MG (50000 UT) PO CAPS: ORAL_CAPSULE | ORAL | 3 refills | Status: DC

## 2022-11-03 NOTE — Progress Notes (Signed)
[facility] [place], [place] [address]  Internal MEDICINE  Office Visit Note  Patient Name: Stacy Pineda  [id number]  [id number]:[id number]  Date of Service: 11/12/2022  Chief Complaint  Patient presents with   Depression   Medicare Wellness     HPI Pt is here for routine health maintenance examination, unfortunately she has not been seen in office in ~1 year -Labs reviewed--vit D very low, iron also very low and oral supplement not helping. She states she was told by GI she would need infusion but this never happened. Will refer now. Her folic acid and [id number] are also low and will refill these and she was doing this at home previously. -Would like refill of promethazine to have on hand for nausea only as needed -on creon now, helped with absorption, but states she did start gaining weight and has been worried about this. Reassured her that her weight is normal and goal is to maintain now. Does occasionally have some severe pain still but is managing. -no longer on depo and menstrual cycle worse, also due for pap and will schedule with OBGYN who also does her breast exam -BP elevated, pt is nervous coming into office and will start monitoring at home now as this is new finding. -Did have covid previously and still having cough at night since then. She is taking her reflux medication and does not think it is due to this. She wonders if she needs inhaler now. Will go ahead and check PFT due to change since covid  Current Medication: Outpatient Encounter Medications as of 11/03/2022  Medication Sig   diazepam (VALIUM) 5 MG tablet Take 5 mg by mouth 3 (three) times daily as needed.   doxepin (SINEQUAN) 10 MG capsule Take 10 mg by mouth at bedtime.   ergocalciferol (DRISDOL) 1.25 MG (50000 UT) capsule Take one cap q week   hydrocortisone (ANUSOL-HC) 2.5 % rectal cream Place 1 Application rectally 2 (two) times daily.   Multiple Vitamins-Minerals (MULTIVITAMIN ADULT PO) Take 1  tablet by mouth.   omeprazole (PRILOSEC) 40 MG capsule Take 1 capsule (40 mg total) by mouth daily before breakfast.   prazosin (MINIPRESS) 1 MG capsule Take 1 mg by mouth at bedtime.   [DISCONTINUED] promethazine (PHENERGAN) 6.25 MG/5ML syrup TAKE  10 ML BY MOUTH EVERY 8 HOURS AS NEEDED FOR NAUSEA AND VOMITING OR  REFRACTORY  NAUSEA/VOMITING   clonazePAM (KLONOPIN) 0.5 MG tablet Take 0.5 mg by mouth 3 (three) times daily. (Patient not taking: Reported on 11/03/2022)   cyanocobalamin (VITAMIN B12) 1000 MCG/ML injection Inject 1ML every week for 4 weeks and then every other week for 2 months. (Patient not taking: Reported on 11/11/2022)   dicyclomine (BENTYL) 20 MG tablet Take 1 tablet (20 mg total) by mouth every 8 (eight) hours. As needed   DULoxetine (CYMBALTA) 30 MG capsule Take 60 mg by mouth daily. (Patient not taking: Reported on 11/03/2022)   DULoxetine (CYMBALTA) 60 MG capsule SMARTSIG:1 Capsule(s) By Mouth Every Evening (Patient not taking: Reported on [id number])   folic acid (FOLVITE) 1 MG tablet Take 1 tablet (1 mg total) by mouth daily.   gabapentin (NEURONTIN) 100 MG capsule Take 2 capsules (200 mg total) by mouth 2 (two) times daily. (Patient not taking: Reported on 11/11/2022)   lipase/protease/amylase (CREON) 36000 UNITS CPEP capsule Take 3 capsules with the first bite of each meal and 2 capsule with the first bite of each snack   oxyCODONE-acetaminophen (PERCOCET) 10-325 MG tablet  Take half to one tab po qd prn for acute and severe pain (Patient not taking: Reported on 11/03/2022)   promethazine (PHENERGAN) 6.25 MG/5ML syrup TAKE  10 ML BY MOUTH EVERY 8 HOURS AS NEEDED FOR NAUSEA AND VOMITING OR  REFRACTORY  NAUSEA/VOMITING   [DISCONTINUED] cyanocobalamin (,VITAMIN B-12,) 1000 MCG/ML injection Inject 1ML every week for 4 weeks and then every other week for 2 months. (Patient not taking: Reported on 11/03/2022)   [DISCONTINUED] folic acid (FOLVITE) 1 MG tablet Take 1 mg by mouth daily.  (Patient not taking: Reported on 11/03/2022)   No facility-administered encounter medications on file as of 11/03/2022.    Surgical History: Past Surgical History:  Procedure Laterality Date   ABDOMINAL SURGERY     BREAST BIOPSY Right 04/27/2020   stereo bx, coil clip, path pending    CHOLECYSTECTOMY     COLONOSCOPY WITH PROPOFOL N/A 09/08/2018   Procedure: COLONOSCOPY WITH PROPOFOL;  Surgeon: Virgel Manifold, MD;  Location: [facility] ENDOSCOPY;  Service: Endoscopy;  Laterality: N/A;   COLONOSCOPY WITH PROPOFOL N/A 08/29/2021   Procedure: COLONOSCOPY WITH PROPOFOL;  Surgeon: Lin Landsman, MD;  Location: [facility];  Service: Endoscopy;  Laterality: N/A;   DECUBITUS ULCER EXCISION     DILATION AND CURETTAGE OF UTERUS     ESOPHAGOGASTRODUODENOSCOPY (EGD) WITH PROPOFOL N/A 09/08/2018   Procedure: ESOPHAGOGASTRODUODENOSCOPY (EGD) WITH PROPOFOL;  Surgeon: Virgel Manifold, MD;  Location: [facility] ENDOSCOPY;  Service: Endoscopy;  Laterality: N/A;   ESOPHAGOGASTRODUODENOSCOPY (EGD) WITH PROPOFOL N/A 08/29/2021   Procedure: ESOPHAGOGASTRODUODENOSCOPY (EGD) WITH PROPOFOL;  Surgeon: Lin Landsman, MD;  Location: [facility];  Service: Endoscopy;  Laterality: N/A;   ESOPHAGOGASTRODUODENOSCOPY (EGD) WITH PROPOFOL N/A 04/10/2022   Procedure: ESOPHAGOGASTRODUODENOSCOPY (EGD) WITH PROPOFOL;  Surgeon: Lin Landsman, MD;  Location: [facility] ENDOSCOPY;  Service: Gastroenterology;  Laterality: N/A;   GASTRIC BYPASS     GASTRIC BYPASS     REVISION GASTRIC RESTRICTIVE PROCEDURE FOR MORBID OBESITY      Medical History: Past Medical History:  Diagnosis Date   Anxiety    Asthma    Bilateral ovarian cysts    Chronic headaches    migraines about 1x/month   Depression    DVT (deep venous thrombosis) 07/2014   right upper extremity   Hypothyroidism    Pancreatitis    Pancreatitis    PTSD (post-traumatic stress disorder)    Wears contact lenses     Family History: Family  History  Problem Relation Age of Onset   Graves' disease Mother    Hypertension Mother    Hyperlipidemia Mother    Anxiety disorder Father    Prostate cancer Father    Depression Brother    Anxiety disorder Brother    Anxiety disorder Maternal Aunt    Depression Maternal Aunt    Anxiety disorder Paternal Aunt    Depression Paternal Aunt    Breast cancer Paternal Aunt    Anxiety disorder Maternal Uncle    Depression Maternal Uncle    Anxiety disorder Paternal Uncle    Depression Paternal Uncle    Anxiety disorder Maternal Grandfather    Depression Maternal Grandfather    Diabetes Maternal Grandfather    Anxiety disorder Maternal Grandmother    Depression Maternal Grandmother    Breast cancer Maternal Grandmother    Ovarian cancer Maternal Grandmother    Diabetes Maternal Grandmother    Colon cancer Neg Hx       Review of Systems  Constitutional:  Negative for chills,  fatigue and unexpected weight change.  HENT:  Positive for postnasal drip. Negative for congestion, rhinorrhea, sneezing and sore throat.   Eyes:  Negative for redness.  Respiratory:  Positive for cough. Negative for chest tightness and shortness of breath.   Cardiovascular:  Negative for chest pain and palpitations.  Gastrointestinal:  Positive for nausea. Negative for constipation, diarrhea and vomiting.  Genitourinary:  Negative for dysuria and frequency.  Musculoskeletal:  Negative for arthralgias, back pain, joint swelling and neck pain.  Skin:  Negative for rash.  Neurological: Negative.  Negative for tremors and numbness.  Hematological:  Negative for adenopathy. Does not bruise/bleed easily.  Psychiatric/Behavioral:  Negative for behavioral problems (Depression), sleep disturbance and suicidal ideas. The patient is not nervous/anxious.      Vital Signs: BP (!) 146/104 Comment: 145/111  Temp 98.3 F (36.8 C)   Resp 16   Ht 6\' 1"  (1.854 m)   Wt 188 lb 9.6 oz (85.5 kg)   BMI 24.88 kg/m     Physical Exam Constitutional:      General: She is not in acute distress.    Appearance: She is well-developed. She is not diaphoretic.  HENT:     Head: Normocephalic and atraumatic.     Mouth/Throat:     Pharynx: No oropharyngeal exudate.  Eyes:     Pupils: Pupils are equal, round, and reactive to light.  Neck:     Thyroid: No thyromegaly.     Vascular: No JVD.     Trachea: No tracheal deviation.  Cardiovascular:     Rate and Rhythm: Normal rate and regular rhythm.     Heart sounds: Normal heart sounds. No murmur heard.    No friction rub. No gallop.  Pulmonary:     Effort: Pulmonary effort is normal. No respiratory distress.     Breath sounds: No wheezing or rales.  Chest:     Chest wall: No tenderness.  Abdominal:     General: Bowel sounds are normal.     Palpations: Abdomen is soft.  Musculoskeletal:        General: Normal range of motion.     Cervical back: Normal range of motion and neck supple.  Lymphadenopathy:     Cervical: No cervical adenopathy.  Skin:    General: Skin is warm and dry.  Neurological:     Mental Status: She is alert and oriented to person, place, and time.     Cranial Nerves: No cranial nerve deficit.  Psychiatric:        Behavior: Behavior normal.        Thought Content: Thought content normal.        Judgment: Judgment normal.      LABS: Recent Results (from the past 2160 hour(s))  CBC w/Diff/Platelet     Status: Abnormal   Collection Time: 10/31/22  8:57 AM  Result Value Ref Range   WBC 5.1 3.4 - 10.8 x10E3/uL   RBC 5.92 (H) 3.77 - 5.28 x10E6/uL   Hemoglobin 11.4 11.1 - 15.9 g/dL   Hematocrit 39.5 34.0 - 46.6 %   MCV 67 (L) 79 - 97 fL   MCH 19.3 (L) 26.6 - 33.0 pg   MCHC 28.9 (L) 31.5 - 35.7 g/dL   RDW 20.1 (H) 11.7 - 15.4 %   Platelets 256 150 - 450 x10E3/uL   Neutrophils 39 Not Estab. %   Lymphs 49 Not Estab. %   Monocytes 7 Not Estab. %   Eos 4 Not Estab. %  Basos 1 Not Estab. %   Neutrophils Absolute 2.0 1.4 - 7.0  x10E3/uL   Lymphocytes Absolute 2.5 0.7 - 3.1 x10E3/uL   Monocytes Absolute 0.3 0.1 - 0.9 x10E3/uL   EOS (ABSOLUTE) 0.2 0.0 - 0.4 x10E3/uL   Basophils Absolute 0.1 0.0 - 0.2 x10E3/uL   Immature Granulocytes 0 Not Estab. %   Immature Grans (Abs) 0.0 0.0 - 0.1 x10E3/uL  Comprehensive metabolic panel     Status: Abnormal   Collection Time: 10/31/22  8:57 AM  Result Value Ref Range   Glucose 82 70 - 99 mg/dL   BUN 10 6 - 24 mg/dL   Creatinine, Ser 0.57 0.57 - 1.00 mg/dL   eGFR 115 >59 mL/min/1.73   BUN/Creatinine Ratio 18 9 - 23   Sodium 145 (H) 134 - 144 mmol/L   Potassium 4.3 3.5 - 5.2 mmol/L   Chloride 107 (H) 96 - 106 mmol/L   CO2 20 20 - 29 mmol/L   Calcium 9.4 8.7 - 10.2 mg/dL   Total Protein 7.0 6.0 - 8.5 g/dL   Albumin 4.2 3.9 - 4.9 g/dL   Globulin, Total 2.8 1.5 - 4.5 g/dL   Albumin/Globulin Ratio 1.5 1.2 - 2.2   Bilirubin Total 0.3 0.0 - 1.2 mg/dL   Alkaline Phosphatase 143 (H) 44 - 121 IU/L   AST 15 0 - 40 IU/L   ALT 14 0 - 32 IU/L  TSH + free T4     Status: None   Collection Time: 10/31/22  8:57 AM  Result Value Ref Range   TSH 0.980 0.450 - 4.500 uIU/mL   Free T4 0.93 0.82 - 1.77 ng/dL  Lipid Panel With LDL/HDL Ratio     Status: None   Collection Time: 10/31/22  8:57 AM  Result Value Ref Range   Cholesterol, Total 153 100 - 199 mg/dL   Triglycerides 56 0 - 149 mg/dL   HDL 75 >39 mg/dL   VLDL Cholesterol Cal 12 5 - 40 mg/dL   LDL Chol Calc (NIH) 66 0 - 99 mg/dL   LDL/HDL Ratio 0.9 0.0 - 3.2 ratio    Comment:                                     LDL/HDL Ratio                                             Men  Women                               1/2 Avg.Risk  1.0    1.5                                   Avg.Risk  3.6    3.2                                2X Avg.Risk  6.2    5.0                                3X  Avg.Risk  8.0    6.1   Fe+TIBC+Fer     Status: Abnormal   Collection Time: 10/31/22  8:57 AM  Result Value Ref Range   Total Iron Binding Capacity 470  (H) 250 - 450 ug/dL   UIBC 441 (H) 131 - 425 ug/dL   Iron 29 27 - 159 ug/dL   Iron Saturation 6 (LL) 15 - 55 %   Ferritin 12 (L) 15 - 150 ng/mL  B12 and Folate Panel     Status: Abnormal   Collection Time: 10/31/22  8:57 AM  Result Value Ref Range   Vitamin B-12 282 232 - 1,245 pg/mL   Folate 2.7 (L) >3.0 ng/mL    Comment: A serum folate concentration of less than 3.1 ng/mL is considered to represent clinical deficiency.   VITAMIN D 25 Hydroxy (Vit-D Deficiency, Fractures)     Status: Abnormal   Collection Time: 10/31/22  8:57 AM  Result Value Ref Range   Vit D, 25-Hydroxy 5.6 (L) 30.0 - 100.0 ng/mL    Comment: Vitamin D deficiency has been defined by the [employer] practice guideline as a level of serum 25-OH vitamin D less than 20 ng/mL (1,2). The Endocrine Society went on to further define vitamin D insufficiency as a level between 21 and 29 ng/mL (2). 1. IOM (Institute of Medicine). 2010. Dietary reference    intakes for calcium and D. [place]: The    [employer]. 2. Holick MF, Binkley [place], Bischoff-Ferrari HA, et al.    Evaluation, treatment, and prevention of vitamin D    deficiency: an Endocrine Society clinical practice    guideline. JCEM. 2011 Jul; 96(7):1911-30.   UA/M w/rflx Culture, Routine     Status: Abnormal   Collection Time: 11/03/22  4:11 PM   Specimen: Urine   Urine  Result Value Ref Range   Specific Gravity, UA      >=1.030 (A) 1.005 - 1.030   pH, UA 6.0 5.0 - 7.5   Color, UA Yellow Yellow   Appearance Ur Turbid (A) Clear   Leukocytes,UA Negative Negative   Protein,UA 1+ (A) Negative/Trace   Glucose, UA Negative Negative   Ketones, UA Negative Negative   RBC, UA Negative Negative   Bilirubin, UA CANCELED     Comment: Test not performed. Unable to perform test due to current unavailability of reagents.  Result canceled by the ancillary.    Urobilinogen, Ur 1.0 0.2 - 1.0 mg/dL   Nitrite, UA  Negative Negative   Microscopic Examination See below:     Comment: Microscopic was indicated and was performed.   Urinalysis Reflex Comment     Comment: This specimen will not reflex to a Urine Culture.  Microscopic Examination     Status: None   Collection Time: 11/03/22  4:11 PM   Urine  Result Value Ref Range   WBC, UA 0-5 0 - 5 /hpf   RBC, Urine 0-2 0 - 2 /hpf   Epithelial Cells (non renal) 0-10 0 - 10 /hpf   Casts None seen None seen /lpf   Bacteria, UA Few None seen/Few        Assessment/Plan: 1. Encounter for general adult medical examination with abnormal findings CPE performed, labs reviewed, seeing OBGYN for pap/breast exam  2. Elevated BP without diagnosis of hypertension Patient will start monitoring at home and bring log next visit  3. Post-COVID chronic cough - Pulmonary Function Test; Future  4. Vitamin D deficiency  Start vitamin D supplement  5. Iron deficiency - Ambulatory referral to Hematology / Oncology  6. Nausea and vomiting, unspecified vomiting type - promethazine (PHENERGAN) 6.25 MG/5ML syrup; TAKE  10 ML BY MOUTH EVERY 8 HOURS AS NEEDED FOR NAUSEA AND VOMITING OR  REFRACTORY  NAUSEA/VOMITING  Dispense: 240 mL; Refill: 2  7. Dysuria - UA/M w/rflx Culture, Routine   General Counseling: Arrissa verbalizes understanding of the findings of todays visit and agrees with plan of treatment. I have discussed any further diagnostic evaluation that may be needed or ordered today. We also reviewed her medications today. she has been encouraged to call the office with any questions or concerns that should arise related to todays visit.    Counseling:    Orders Placed This Encounter  Procedures   Microscopic Examination   UA/M w/rflx Culture, Routine   Ambulatory referral to Hematology / Oncology   Pulmonary Function Test    Meds ordered this encounter  Medications   ergocalciferol (DRISDOL) 1.25 MG (50000 UT) capsule    Sig: Take one cap q week     Dispense:  12 capsule    Refill:  3   cyanocobalamin (VITAMIN B12) 1000 MCG/ML injection    Sig: Inject 1ML every week for 4 weeks and then every other week for 2 months.    Dispense:  4 mL    Refill:  3   folic acid (FOLVITE) 1 MG tablet    Sig: Take 1 tablet (1 mg total) by mouth daily.    Dispense:  90 tablet    Refill:  1   promethazine (PHENERGAN) 6.25 MG/5ML syrup    Sig: TAKE  10 ML BY MOUTH EVERY 8 HOURS AS NEEDED FOR NAUSEA AND VOMITING OR  REFRACTORY  NAUSEA/VOMITING    Dispense:  240 mL    Refill:  2    This patient was seen by Drema Dallas, PA-C in collaboration with Dr. Clayborn Bigness as a part of collaborative care agreement.  Total time spent:35 Minutes  Time spent includes review of chart, medications, test results, and follow up plan with the patient.     Lavera Guise, MD  Internal Medicine

## 2022-11-04 LAB — MICROSCOPIC EXAMINATION: Casts: NONE SEEN /lpf

## 2022-11-04 LAB — UA/M W/RFLX CULTURE, ROUTINE
Glucose, UA: NEGATIVE
Ketones, UA: NEGATIVE
Leukocytes,UA: NEGATIVE
Nitrite, UA: NEGATIVE
RBC, UA: NEGATIVE
Specific Gravity, UA: >=1.03 — AB (ref 1.005–1.030)
Urobilinogen, Ur: 1 mg/dL (ref 0.2–1.0)
pH, UA: 6 (ref 5.0–7.5)

## 2022-11-07 ENCOUNTER — Ambulatory Visit: Payer: BC Managed Care – PPO | Admitting: Physician Assistant

## 2022-11-11 ENCOUNTER — Inpatient Hospital Stay: Payer: BC Managed Care – PPO

## 2022-11-11 ENCOUNTER — Encounter: Payer: Self-pay | Admitting: Oncology

## 2022-11-11 ENCOUNTER — Inpatient Hospital Stay: Payer: BC Managed Care – PPO | Attending: Oncology | Admitting: Oncology

## 2022-11-11 VITALS — BP 154/116 | HR 78 | Temp 97.6°F | Resp 16 | Ht 73.0 in | Wt 188.0 lb

## 2022-11-11 DIAGNOSIS — Z9884 Bariatric surgery status: Secondary | ICD-10-CM | POA: Diagnosis not present

## 2022-11-11 DIAGNOSIS — F17210 Nicotine dependence, cigarettes, uncomplicated: Secondary | ICD-10-CM | POA: Diagnosis not present

## 2022-11-11 DIAGNOSIS — D509 Iron deficiency anemia, unspecified: Secondary | ICD-10-CM | POA: Diagnosis not present

## 2022-11-11 NOTE — Progress Notes (Signed)
[place]  [phone] [phone]  ID: Stacy Pineda OB: 05-27-1981  MR#: [id number]  [id number]  Patient Care Team: Carolynne Edouard as PCP - General (Physician Assistant)  CHIEF COMPLAINT: Iron deficiency anemia.  INTERVAL HISTORY: Patient is a 42 year old female who was last seen in clinic greater than 4 years ago who was referred back for increasing weakness and fatigue with a declining hemoglobin and iron stores.  Patient reports she had a recent "bleeding ulcer", but otherwise has felt well.  She has no neurologic complaints.  She denies any recent fevers or illnesses.  She has a good appetite and denies weight loss.  She has no chest pain, shortness of breath, cough, or hemoptysis.  She denies any nausea, vomiting, constipation, or diarrhea.  She has no melena or hematochezia.  She has no urinary complaints.  Patient offers no further specific complaints today.  REVIEW OF SYSTEMS:   Review of Systems  Constitutional:  Positive for malaise/fatigue. Negative for fever and weight loss.  Respiratory: Negative.  Negative for cough, hemoptysis and shortness of breath.   Cardiovascular: Negative.  Negative for chest pain and leg swelling.  Gastrointestinal:  Negative for abdominal pain, blood in stool and melena.  Genitourinary: Negative.  Negative for hematuria.  Musculoskeletal: Negative.  Negative for back pain.  Skin: Negative.  Negative for rash.  Neurological:  Positive for weakness. Negative for dizziness, focal weakness and headaches.  Psychiatric/Behavioral: Negative.  The patient is not nervous/anxious.     As per HPI. Otherwise, a complete review of systems is negative.  PAST MEDICAL HISTORY: Past Medical History:  Diagnosis Date   Anxiety    Asthma    Bilateral ovarian cysts    Chronic headaches    migraines about 1x/month   Depression    DVT (deep venous thrombosis) 07/2014   right upper extremity    Hypothyroidism    Pancreatitis    Pancreatitis    PTSD (post-traumatic stress disorder)    Wears contact lenses     PAST SURGICAL HISTORY: Past Surgical History:  Procedure Laterality Date   ABDOMINAL SURGERY     BREAST BIOPSY Right 04/27/2020   stereo bx, coil clip, path pending    CHOLECYSTECTOMY     COLONOSCOPY WITH PROPOFOL N/A 09/08/2018   Procedure: COLONOSCOPY WITH PROPOFOL;  Surgeon: Virgel Manifold, MD;  Location: [facility];  Service: Endoscopy;  Laterality: N/A;   COLONOSCOPY WITH PROPOFOL N/A 08/29/2021   Procedure: COLONOSCOPY WITH PROPOFOL;  Surgeon: Lin Landsman, MD;  Location: [place];  Service: Endoscopy;  Laterality: N/A;   DECUBITUS ULCER EXCISION     DILATION AND CURETTAGE OF UTERUS     ESOPHAGOGASTRODUODENOSCOPY (EGD) WITH PROPOFOL N/A 09/08/2018   Procedure: ESOPHAGOGASTRODUODENOSCOPY (EGD) WITH PROPOFOL;  Surgeon: Virgel Manifold, MD;  Location: [facility];  Service: Endoscopy;  Laterality: N/A;   ESOPHAGOGASTRODUODENOSCOPY (EGD) WITH PROPOFOL N/A 08/29/2021   Procedure: ESOPHAGOGASTRODUODENOSCOPY (EGD) WITH PROPOFOL;  Surgeon: Lin Landsman, MD;  Location: [place];  Service: Endoscopy;  Laterality: N/A;   ESOPHAGOGASTRODUODENOSCOPY (EGD) WITH PROPOFOL N/A 04/10/2022   Procedure: ESOPHAGOGASTRODUODENOSCOPY (EGD) WITH PROPOFOL;  Surgeon: Lin Landsman, MD;  Location: [facility] END[facility]COPY;  Service: Gastroenterology;  Laterality: N/A;   GASTRIC BYPASS     GASTRIC BYPASS     REVISION GASTRIC RESTRICTIVE PROCEDURE FOR MORBID OBESITY      FAMILY HISTORY: Family History  Problem Relation Age of Onset   Graves' disease Mother  Hypertension Mother    Hyperlipidemia Mother    Anxiety disorder Father    Prostate cancer Father    Depression Brother    Anxiety disorder Brother    Anxiety disorder Maternal Aunt    Depression Maternal Aunt    Anxiety disorder Paternal Aunt    Depression Paternal Aunt    Breast  cancer Paternal Aunt    Anxiety disorder Maternal Uncle    Depression Maternal Uncle    Anxiety disorder Paternal Uncle    Depression Paternal Uncle    Anxiety disorder Maternal Grandfather    Depression Maternal Grandfather    Diabetes Maternal Grandfather    Anxiety disorder Maternal Grandmother    Depression Maternal Grandmother    Breast cancer Maternal Grandmother    Ovarian cancer Maternal Grandmother    Diabetes Maternal Grandmother    Colon cancer Neg Hx     ADVANCED DIRECTIVES (Y/N):  N  HEALTH MAINTENANCE: Social History   Tobacco Use   Smoking status: Some Days    Packs/day: .25    Types: Cigarettes    Start date: 03/26/2005   Smokeless tobacco: Never   Tobacco comments:    2 cigarettes daily  Vaping Use   Vaping Use: Never used  Substance Use Topics   Alcohol use: No    Alcohol/week: 0.0 standard drinks of alcohol   Drug use: Yes    Frequency: 3.0 times per week    Types: Marijuana    Comment: last used 2 weeks ago     Colonoscopy:  PAP:  Bone density:  Lipid panel:  Allergies  Allergen Reactions   Fentanyl Itching   Morphine And Related Itching    Itchy.    Vicodin [Hydrocodone-Acetaminophen] Itching    Current Outpatient Medications  Medication Sig Dispense Refill   buPROPion (WELLBUTRIN XL) 300 MG 24 hr tablet Take 300 mg by mouth every morning.     diazepam (VALIUM) 5 MG tablet Take 5 mg by mouth 3 (three) times daily as needed.     dicyclomine (BENTYL) 20 MG tablet Take 1 tablet (20 mg total) by mouth every 8 (eight) hours. As needed 120 tablet 1   doxepin (SINEQUAN) 10 MG capsule Take 10 mg by mouth at bedtime.     ergocalciferol (DRISDOL) 1.25 MG (50000 UT) capsule Take one cap q week 12 capsule 3   folic acid (FOLVITE) 1 MG tablet Take 1 tablet (1 mg total) by mouth daily. 90 tablet 1   hydrocortisone (ANUSOL-HC) 2.5 % rectal cream Place 1 Application rectally 2 (two) times daily. 30 g 1   lamoTRIgine (LAMICTAL) 100 MG tablet Take 100  mg by mouth at bedtime.     lipase/protease/amylase (CREON) 36000 UNITS CPEP capsule Take 3 capsules with the first bite of each meal and 2 capsule with the first bite of each snack 330 capsule 5   Multiple Vitamins-Minerals (MULTIVITAMIN ADULT PO) Take 1 tablet by mouth.     omeprazole (PRILOSEC) 40 MG capsule Take 1 capsule (40 mg total) by mouth daily before breakfast. 90 capsule 3   prazosin (MINIPRESS) 1 MG capsule Take 1 mg by mouth at bedtime.     promethazine (PHENERGAN) 6.25 MG/5ML syrup TAKE  10 ML BY MOUTH EVERY 8 HOURS AS NEEDED FOR NAUSEA AND VOMITING OR  REFRACTORY  NAUSEA/VOMITING 240 mL 2   clonazePAM (KLONOPIN) 0.5 MG tablet Take 0.5 mg by mouth 3 (three) times daily. (Patient not taking: Reported on 11/03/2022)     cyanocobalamin (VITAMIN B12)  1000 MCG/ML injection Inject 1ML every week for 4 weeks and then every other week for 2 months. (Patient not taking: Reported on 11/11/2022) 4 mL 3   DULoxetine (CYMBALTA) 30 MG capsule Take 60 mg by mouth daily. (Patient not taking: Reported on 11/03/2022)     DULoxetine (CYMBALTA) 60 MG capsule SMARTSIG:1 Capsule(s) By Mouth Every Evening (Patient not taking: Reported on 11/03/2022)     gabapentin (NEURONTIN) 100 MG capsule Take 2 capsules (200 mg total) by mouth 2 (two) times daily. (Patient not taking: Reported on 11/11/2022) 120 capsule 0   oxyCODONE-acetaminophen (PERCOCET) 10-325 MG tablet Take half to one tab po qd prn for acute and severe pain (Patient not taking: Reported on 11/03/2022) 30 tablet 0   No current facility-administered medications for this visit.    OBJECTIVE: Vitals:   11/11/22 0923  BP: (!) 154/116  Pulse: 78  Resp: 16  Temp: 97.6 F (36.4 C)  SpO2: 100%     Body mass index is 24.8 kg/m.    ECOG FS:1 - Symptomatic but completely ambulatory  General: Well-developed, well-nourished, no acute distress. Eyes: Pink conjunctiva, anicteric sclera. HEENT: Normocephalic, moist mucous membranes. Lungs: No audible  wheezing or coughing. Heart: Regular rate and rhythm. Abdomen: Soft, nontender, no obvious distention. Musculoskeletal: No edema, cyanosis, or clubbing. Neuro: Alert, answering all questions appropriately. Cranial nerves grossly intact. Skin: No rashes or petechiae noted. Psych: Normal affect. Lymphatics: No cervical, calvicular, axillary or inguinal LAD.   LAB RESULTS:  Lab Results  Component Value Date   NA 145 (H) 10/31/2022   K 4.3 10/31/2022   CL 107 (H) 10/31/2022   CO2 20 10/31/2022   GLUCOSE 82 10/31/2022   BUN 10 10/31/2022   CREATININE 0.57 10/31/2022   CALCIUM 9.4 10/31/2022   PROT 7.0 10/31/2022   ALBUMIN 4.2 10/31/2022   AST 15 10/31/2022   ALT 14 10/31/2022   ALKPHOS 143 (H) 10/31/2022   BILITOT 0.3 10/31/2022   GFRNONAA 115 09/28/2019   GFRAA 132 09/28/2019    Lab Results  Component Value Date   WBC 5.1 10/31/2022   NEUTROABS 2.0 10/31/2022   HGB 11.4 10/31/2022   HCT 39.5 10/31/2022   MCV 67 (L) 10/31/2022   PLT 256 10/31/2022   Lab Results  Component Value Date   IRON 29 10/31/2022   TIBC 470 (H) 10/31/2022   IRONPCTSAT 6 (LL) 10/31/2022   Lab Results  Component Value Date   FERRITIN 12 (L) 10/31/2022     STUDIES: No results found.  ASSESSMENT: Iron deficiency anemia.  PLAN:    Iron deficiency anemia: Patient has a history of Roux-en-Y gastrojejunostomy.  Her hemoglobin and iron stores have trended down and she is symptomatic.  She will benefit from 5 doses of 200 mg IV Venofer over the next 2 to 3 weeks.  Patient will then return to clinic in 4 months with repeat laboratory, further evaluation, and continuation of treatment if necessary. Gastric ulcer: Follow-up with GI as indicated.  I spent a total of 45 minutes reviewing chart data, face-to-face evaluation with the patient, counseling and coordination of care as detailed above.   Patient expressed understanding and was in agreement with this plan. She also understands that She can  call clinic at any time with any questions, concerns, or complaints.    Lloyd Huger, MD   11/11/2022 10:37 AM

## 2022-11-11 NOTE — Progress Notes (Signed)
Patient seen in 2020 for Iron deficiency. Has been feeling tired and has no energy. States that iron infusions helped a lot in the past.

## 2022-11-12 MED FILL — Iron Sucrose Inj 20 MG/ML (Fe Equiv): INTRAVENOUS | Qty: 10 | Status: AC

## 2022-11-13 ENCOUNTER — Inpatient Hospital Stay: Payer: BC Managed Care – PPO

## 2022-11-13 VITALS — BP 154/107 | HR 75 | Temp 97.5°F | Resp 16

## 2022-11-13 DIAGNOSIS — D509 Iron deficiency anemia, unspecified: Secondary | ICD-10-CM | POA: Diagnosis not present

## 2022-11-13 DIAGNOSIS — Z9884 Bariatric surgery status: Secondary | ICD-10-CM | POA: Diagnosis not present

## 2022-11-13 DIAGNOSIS — F17210 Nicotine dependence, cigarettes, uncomplicated: Secondary | ICD-10-CM | POA: Diagnosis not present

## 2022-11-13 MED ORDER — SODIUM CHLORIDE 0.9 % IV SOLN
200.0000 mg | Freq: Once | INTRAVENOUS | Status: AC
  Administered 2022-11-13: 200 mg via INTRAVENOUS
  Filled 2022-11-13: qty 200

## 2022-11-13 MED ORDER — SODIUM CHLORIDE 0.9 % IV SOLN
Freq: Once | INTRAVENOUS | Status: AC
  Filled 2022-11-13: qty 250

## 2022-11-13 NOTE — Progress Notes (Signed)
Pt BP elevated on arrival and at discharge. Pt states it runs normal 120/80s at home. Pt has follow up with PCP regarding same. Pt stable and tolerated tx without incident.

## 2022-11-19 ENCOUNTER — Inpatient Hospital Stay: Payer: BC Managed Care – PPO

## 2022-11-19 VITALS — BP 145/100 | HR 79 | Temp 97.4°F | Resp 16

## 2022-11-19 DIAGNOSIS — Z9884 Bariatric surgery status: Secondary | ICD-10-CM | POA: Diagnosis not present

## 2022-11-19 DIAGNOSIS — F17210 Nicotine dependence, cigarettes, uncomplicated: Secondary | ICD-10-CM | POA: Diagnosis not present

## 2022-11-19 DIAGNOSIS — D509 Iron deficiency anemia, unspecified: Secondary | ICD-10-CM

## 2022-11-19 MED ORDER — SODIUM CHLORIDE 0.9 % IV SOLN
200.0000 mg | Freq: Once | INTRAVENOUS | Status: AC
  Administered 2022-11-19: 200 mg via INTRAVENOUS
  Filled 2022-11-19: qty 200

## 2022-11-19 MED ORDER — SODIUM CHLORIDE 0.9 % IV SOLN
Freq: Once | INTRAVENOUS | Status: AC
  Filled 2022-11-19: qty 250

## 2022-11-19 NOTE — Patient Instructions (Signed)

## 2022-11-19 NOTE — Progress Notes (Signed)
Pt tolerated treatment without complaints.  VSS.  Pt refused 30 minute post observation.  

## 2022-11-20 MED FILL — Iron Sucrose Inj 20 MG/ML (Fe Equiv): INTRAVENOUS | Qty: 10 | Status: AC

## 2022-11-21 ENCOUNTER — Inpatient Hospital Stay: Payer: BC Managed Care – PPO

## 2022-11-21 VITALS — BP 150/108 | HR 78 | Temp 97.9°F | Resp 16

## 2022-11-21 DIAGNOSIS — D509 Iron deficiency anemia, unspecified: Secondary | ICD-10-CM | POA: Diagnosis not present

## 2022-11-21 DIAGNOSIS — F17210 Nicotine dependence, cigarettes, uncomplicated: Secondary | ICD-10-CM | POA: Diagnosis not present

## 2022-11-21 DIAGNOSIS — Z9884 Bariatric surgery status: Secondary | ICD-10-CM | POA: Diagnosis not present

## 2022-11-21 MED ORDER — SODIUM CHLORIDE 0.9 % IV SOLN
Freq: Once | INTRAVENOUS | Status: AC
  Filled 2022-11-21: qty 250

## 2022-11-21 MED ORDER — SODIUM CHLORIDE 0.9 % IV SOLN
200.0000 mg | Freq: Once | INTRAVENOUS | Status: AC
  Administered 2022-11-21: 200 mg via INTRAVENOUS
  Filled 2022-11-21: qty 200

## 2022-11-21 NOTE — Progress Notes (Signed)
Patient declined to wait the 30 minutes for post iron infusion observation today.  Post iron education done. Patient verbalized understanding. Discharged in stable condition.

## 2022-11-26 ENCOUNTER — Inpatient Hospital Stay: Payer: BC Managed Care – PPO

## 2022-11-26 VITALS — BP 134/96 | HR 74 | Temp 96.7°F | Resp 18

## 2022-11-26 DIAGNOSIS — D509 Iron deficiency anemia, unspecified: Secondary | ICD-10-CM | POA: Diagnosis not present

## 2022-11-26 DIAGNOSIS — Z9884 Bariatric surgery status: Secondary | ICD-10-CM | POA: Diagnosis not present

## 2022-11-26 DIAGNOSIS — F17210 Nicotine dependence, cigarettes, uncomplicated: Secondary | ICD-10-CM | POA: Diagnosis not present

## 2022-11-26 MED ORDER — SODIUM CHLORIDE 0.9 % IV SOLN
Freq: Once | INTRAVENOUS | Status: AC
  Filled 2022-11-26: qty 250

## 2022-11-26 MED ORDER — SODIUM CHLORIDE 0.9 % IV SOLN
200.0000 mg | Freq: Once | INTRAVENOUS | Status: AC
  Administered 2022-11-26: 200 mg via INTRAVENOUS
  Filled 2022-11-26: qty 200

## 2022-11-27 DIAGNOSIS — F331 Major depressive disorder, recurrent, moderate: Secondary | ICD-10-CM | POA: Diagnosis not present

## 2022-11-27 DIAGNOSIS — F4011 Social phobia, generalized: Secondary | ICD-10-CM | POA: Diagnosis not present

## 2022-11-27 DIAGNOSIS — F4312 Post-traumatic stress disorder, chronic: Secondary | ICD-10-CM | POA: Diagnosis not present

## 2022-11-27 DIAGNOSIS — F4001 Agoraphobia with panic disorder: Secondary | ICD-10-CM | POA: Diagnosis not present

## 2022-11-27 MED FILL — Iron Sucrose Inj 20 MG/ML (Fe Equiv): INTRAVENOUS | Qty: 10 | Status: AC

## 2022-11-28 ENCOUNTER — Other Ambulatory Visit: Payer: Self-pay | Admitting: Internal Medicine

## 2022-11-28 ENCOUNTER — Inpatient Hospital Stay: Payer: BC Managed Care – PPO

## 2022-11-28 NOTE — Progress Notes (Signed)
B/P 147/106. Pt stable and denies any s/s. Pt reports she has an MD appt with PCP on Monday 12/01/2022. Per on call MD, Dr. Donneta Romberg hold Venofer today and have Pt follow up with PCP as scheduled. Pt aware, and verbalizes frustration with the decision and states "she will not be back".  Pt stable at discharge, and MD made aware.

## 2022-12-01 ENCOUNTER — Ambulatory Visit (INDEPENDENT_AMBULATORY_CARE_PROVIDER_SITE_OTHER): Payer: BC Managed Care – PPO | Admitting: Physician Assistant

## 2022-12-01 ENCOUNTER — Encounter: Payer: Self-pay | Admitting: Physician Assistant

## 2022-12-01 VITALS — BP 146/102 | HR 79 | Temp 98.4°F | Resp 16 | Ht 73.0 in | Wt 189.8 lb

## 2022-12-01 DIAGNOSIS — R053 Chronic cough: Secondary | ICD-10-CM

## 2022-12-01 DIAGNOSIS — I10 Essential (primary) hypertension: Secondary | ICD-10-CM | POA: Diagnosis not present

## 2022-12-01 MED ORDER — AMLODIPINE BESYLATE 2.5 MG PO TABS
2.5000 mg | ORAL_TABLET | Freq: Every day | ORAL | 1 refills | Status: DC
Start: 2022-12-01 — End: 2022-12-29

## 2022-12-01 NOTE — Progress Notes (Signed)
[facility] [address] [place], [place] [address]  Internal MEDICINE  Office Visit Note  Patient Name: Stacy Pineda  [id number]  [id number]  Date of Service: 12/10/2022  Chief Complaint  Patient presents with   Follow-up   Depression    HPI Pt is here for routine follow up -BP at home 136-150/90-110, will need to start medication and continue to monitor -Cough is just about gone, no sob at night. Never had PFT and will cancel since symptoms have improved -some stress may contribute to BP along with smoking again. 2-3 cigs per day -right upper neck/scapular muscle spasm and will try lacrosse ball/tennis ball massaging and heating pad and topicals. If not improving consider muscle relaxer  Current Medication: Outpatient Encounter Medications as of 12/01/2022  Medication Sig   amLODipine (NORVASC) 2.5 MG tablet Take 1 tablet (2.5 mg total) by mouth daily.   buPROPion (WELLBUTRIN XL) 300 MG 24 hr tablet Take 300 mg by mouth every morning.   clonazePAM (KLONOPIN) 0.5 MG tablet Take 0.5 mg by mouth 3 (three) times daily.   cyanocobalamin (VITAMIN B12) 1000 MCG/ML injection Inject every week for 4 weeks and then every other week for 2 months.   diazepam (VALIUM) 5 MG tablet Take 5 mg by mouth 3 (three) times daily as needed.   doxepin (SINEQUAN) 10 MG capsule Take 10 mg by mouth at bedtime.   DULoxetine (CYMBALTA) 30 MG capsule Take 60 mg by mouth daily.   DULoxetine (CYMBALTA) 60 MG capsule    ergocalciferol (DRISDOL) 1.25 MG (50000 UT) capsule Take one cap q week   folic acid (FOLVITE) 1 MG tablet Take 1 tablet (1 mg total) by mouth daily.   hydrocortisone (ANUSOL-HC) 2.5 % rectal cream Place 1 Application rectally 2 (two) times daily.   lamoTRIgine (LAMICTAL) 100 MG tablet Take 100 mg by mouth at bedtime.   lipase/protease/amylase (CREON) 36000 UNITS CPEP capsule Take 3 capsules with the first bite of each meal and 2 capsule with the first bite of each snack    Multiple Vitamins-Minerals (MULTIVITAMIN ADULT PO) Take 1 tablet by mouth.   omeprazole (PRILOSEC) 40 MG capsule Take 1 capsule (40 mg total) by mouth daily before breakfast.   oxyCODONE-acetaminophen (PERCOCET) 10-325 MG tablet Take half to one tab po qd prn for acute and severe pain   prazosin (MINIPRESS) 1 MG capsule Take 1 mg by mouth at bedtime.   promethazine (PHENERGAN) 6.25 MG/5ML syrup TAKE  10 ML BY MOUTH EVERY 8 HOURS AS NEEDED FOR NAUSEA AND VOMITING OR  REFRACTORY  NAUSEA/VOMITING   dicyclomine (BENTYL) 20 MG tablet Take 1 tablet (20 mg total) by mouth every 8 (eight) hours. As needed   gabapentin (NEURONTIN) 100 MG capsule Take 2 capsules (200 mg total) by mouth 2 (two) times daily. (Patient not taking: Reported on 11/11/2022)   No facility-administered encounter medications on file as of 12/01/2022.    Surgical History: Past Surgical History:  Procedure Laterality Date   ABDOMINAL SURGERY     BREAST BIOPSY Right 04/27/2020   stereo bx, coil clip, path pending    CHOLECYSTECTOMY     COLONOSCOPY WITH PROPOFOL N/A 09/08/2018   Procedure: COLONOSCOPY WITH PROPOFOL;  Surgeon: Pasty Spillers, MD;  Location: [facility] ENDOSCOPY;  Service: Endoscopy;  Laterality: N/A;   COLONOSCOPY WITH PROPOFOL N/A 08/29/2021   Procedure: COLONOSCOPY WITH PROPOFOL;  Surgeon: Toney Reil, MD;  Location: [facility];  Service: Endoscopy;  Laterality: N/A;   DECUBITUS  ULCER EXCISION     DILATION AND CURETTAGE OF UTERUS     ESOPHAGOGASTRODUODENOSCOPY (EGD) WITH PROPOFOL N/A 09/08/2018   Procedure: ESOPHAGOGASTRODUODENOSCOPY (EGD) WITH PROPOFOL;  Surgeon: Pasty Spillers, MD;  Location: [facility];  Service: Endoscopy;  Laterality: N/A;   ESOPHAGOGASTRODUODENOSCOPY (EGD) WITH PROPOFOL N/A 08/29/2021   Procedure: ESOPHAGOGASTRODUODENOSCOPY (EGD) WITH PROPOFOL;  Surgeon: Toney Reil, MD;  Location: [facility];  Service: Endoscopy;  Laterality: N/A;    ESOPHAGOGASTRODUODENOSCOPY (EGD) WITH PROPOFOL N/A 04/10/2022   Procedure: ESOPHAGOGASTRODUODENOSCOPY (EGD) WITH PROPOFOL;  Surgeon: Toney Reil, MD;  Location: [facility] ENDOSCOPY;  Service: Gastroenterology;  Laterality: N/A;   GASTRIC BYPASS     GASTRIC BYPASS     REVISION GASTRIC RESTRICTIVE PROCEDURE FOR MORBID OBESITY      Medical History: Past Medical History:  Diagnosis Date   Anxiety    Asthma    Bilateral ovarian cysts    Chronic headaches    migraines about 1x/month   Depression    DVT (deep venous thrombosis) (HCC) 07/2014   right upper extremity   Hypothyroidism    Pancreatitis    Pancreatitis    PTSD (post-traumatic stress disorder)    Wears contact lenses     Family History: Family History  Problem Relation Age of Onset   Graves' disease Mother    Hypertension Mother    Hyperlipidemia Mother    Anxiety disorder Father    Prostate cancer Father    Depression Brother    Anxiety disorder Brother    Anxiety disorder Maternal Aunt    Depression Maternal Aunt    Anxiety disorder Paternal Aunt    Depression Paternal Aunt    Breast cancer Paternal Aunt    Anxiety disorder Maternal Uncle    Depression Maternal Uncle    Anxiety disorder Paternal Uncle    Depression Paternal Uncle    Anxiety disorder Maternal Grandfather    Depression Maternal Grandfather    Diabetes Maternal Grandfather    Anxiety disorder Maternal Grandmother    Depression Maternal Grandmother    Breast cancer Maternal Grandmother    Ovarian cancer Maternal Grandmother    Diabetes Maternal Grandmother    Colon cancer Neg Hx     Social History   Socioeconomic History   Marital status: Single    Spouse name: Not on file   Number of children: Not on file   Years of education: Not on file   Highest education level: Not on file  Occupational History   Not on file  Tobacco Use   Smoking status: Some Days    Packs/day: .25    Types: Cigarettes    Start date: 03/26/2005   Smokeless  tobacco: Never   Tobacco comments:    2 cigarettes daily  Vaping Use   Vaping Use: Never used  Substance and Sexual Activity   Alcohol use: No    Alcohol/week: 0.0 standard drinks of alcohol   Drug use: Yes    Frequency: 3.0 times per week    Types: Marijuana    Comment: last used 2 weeks ago   Sexual activity: Not Currently    Birth control/protection: Injection  Other Topics Concern   Not on file  Social History Narrative   Not on file   Social Determinants of Health   Financial Resource Strain: Not on file  Food Insecurity: Not on file  Transportation Needs: Not on file  Physical Activity: Not on file  Stress: Not on file  Social Connections: Not on  file  Intimate Partner Violence: Not on file      Review of Systems  Constitutional:  Negative for chills, fatigue and unexpected weight change.  HENT:  Negative for congestion, postnasal drip, rhinorrhea, sneezing and sore throat.   Eyes:  Negative for redness.  Respiratory:  Negative for cough, chest tightness and shortness of breath.   Cardiovascular:  Negative for chest pain and palpitations.  Gastrointestinal:  Negative for abdominal pain, constipation, diarrhea, nausea and vomiting.  Genitourinary:  Negative for dysuria and frequency.  Musculoskeletal:  Positive for myalgias. Negative for arthralgias, back pain, joint swelling and neck pain.  Skin:  Negative for rash.  Neurological: Negative.  Negative for tremors and numbness.  Hematological:  Negative for adenopathy. Does not bruise/bleed easily.  Psychiatric/Behavioral:  Negative for behavioral problems (Depression), sleep disturbance and suicidal ideas. The patient is not nervous/anxious.     Vital Signs: BP (!) 146/102 Comment: 162/111  Pulse 79   Temp 98.4 F (36.9 C)   Resp 16   Ht 6\' 1"  (1.854 m)   Wt 189 lb 12.8 oz (86.1 kg)   SpO2 98%   BMI 25.04 kg/m    Physical Exam Constitutional:      General: She is not in acute distress.     Appearance: She is well-developed. She is not diaphoretic.  HENT:     Head: Normocephalic and atraumatic.     Mouth/Throat:     Pharynx: No oropharyngeal exudate.  Eyes:     Pupils: Pupils are equal, round, and reactive to light.  Neck:     Thyroid: No thyromegaly.     Vascular: No JVD.     Trachea: No tracheal deviation.  Cardiovascular:     Rate and Rhythm: Normal rate and regular rhythm.     Heart sounds: Normal heart sounds. No murmur heard.    No friction rub. No gallop.  Pulmonary:     Effort: Pulmonary effort is normal. No respiratory distress.     Breath sounds: No wheezing or rales.  Chest:     Chest wall: No tenderness.  Abdominal:     General: Bowel sounds are normal.     Palpations: Abdomen is soft.  Musculoskeletal:        General: Normal range of motion.     Cervical back: Normal range of motion and neck supple.  Lymphadenopathy:     Cervical: No cervical adenopathy.  Skin:    General: Skin is warm and dry.  Neurological:     Mental Status: She is alert and oriented to person, place, and time.     Cranial Nerves: No cranial nerve deficit.  Psychiatric:        Behavior: Behavior normal.        Thought Content: Thought content normal.        Judgment: Judgment normal.        Assessment/Plan: 1. Essential hypertension Start 2.3 mg amlodipine and will titrate up as needed. Will continue to monitor - amLODipine (NORVASC) 2.5 MG tablet; Take 1 tablet (2.5 mg total) by mouth daily.  Dispense: 90 tablet; Refill: 1   General Counseling: Patricia verbalizes understanding of the findings of todays visit and agrees with plan of treatment. I have discussed any further diagnostic evaluation that may be needed or ordered today. We also reviewed her medications today. she has been encouraged to call the office with any questions or concerns that should arise related to todays visit.    No orders of the defined  types were placed in this encounter.   Meds ordered this  encounter  Medications   amLODipine (NORVASC) 2.5 MG tablet    Sig: Take 1 tablet (2.5 mg total) by mouth daily.    Dispense:  90 tablet    Refill:  1    This patient was seen by Lynn Ito, PA-C in collaboration with Dr. Beverely Risen as a part of collaborative care agreement.   Total time spent:30 Minutes Time spent includes review of chart, medications, test results, and follow up plan with the patient.      Dr Lyndon Code Internal medicine

## 2022-12-29 ENCOUNTER — Ambulatory Visit (INDEPENDENT_AMBULATORY_CARE_PROVIDER_SITE_OTHER): Payer: BC Managed Care – PPO | Admitting: Physician Assistant

## 2022-12-29 ENCOUNTER — Encounter: Payer: Self-pay | Admitting: Physician Assistant

## 2022-12-29 VITALS — BP 130/95 | HR 101 | Temp 98.3°F | Resp 16 | Ht 73.0 in | Wt 185.2 lb

## 2022-12-29 DIAGNOSIS — N921 Excessive and frequent menstruation with irregular cycle: Secondary | ICD-10-CM

## 2022-12-29 DIAGNOSIS — I10 Essential (primary) hypertension: Secondary | ICD-10-CM

## 2022-12-29 MED ORDER — AMLODIPINE BESYLATE 10 MG PO TABS
10.0000 mg | ORAL_TABLET | Freq: Every day | ORAL | 1 refills | Status: DC
Start: 2022-12-29 — End: 2023-06-29

## 2022-12-29 NOTE — Progress Notes (Signed)
[facility] [address] [place], [place] [address]  Internal MEDICINE  Office Visit Note  Patient Name: Stacy Pineda  [id number]  [id number]  Date of Service: 01/07/2023  Chief Complaint  Patient presents with   Follow-up   Depression    HPI Pt is here for routine follow up -Is taking 2 tabs amlodipine (5mg ) and top number improved but diastolic still high. Reprots her mom and brother have high Bp and took 2 meds to control it. -Will go ahead and increase amlodipine further and then consider additional med if indicated -Does request OBGYN referral  Current Medication: Outpatient Encounter Medications as of 12/29/2022  Medication Sig   amLODipine (NORVASC) 10 MG tablet Take 1 tablet (10 mg total) by mouth daily.   buPROPion (WELLBUTRIN XL) 300 MG 24 hr tablet Take 300 mg by mouth every morning.   clonazePAM (KLONOPIN) 0.5 MG tablet Take 0.5 mg by mouth 3 (three) times daily.   cyanocobalamin (VITAMIN B12) 1000 MCG/ML injection Inject every week for 4 weeks and then every other week for 2 months.   diazepam (VALIUM) 5 MG tablet Take 5 mg by mouth 3 (three) times daily as needed.   doxepin (SINEQUAN) 10 MG capsule Take 10 mg by mouth at bedtime.   DULoxetine (CYMBALTA) 30 MG capsule Take 60 mg by mouth daily.   DULoxetine (CYMBALTA) 60 MG capsule    ergocalciferol (DRISDOL) 1.25 MG (50000 UT) capsule Take one cap q week   folic acid (FOLVITE) 1 MG tablet Take 1 tablet (1 mg total) by mouth daily.   hydrocortisone (ANUSOL-HC) 2.5 % rectal cream Place 1 Application rectally 2 (two) times daily.   lamoTRIgine (LAMICTAL) 100 MG tablet Take 100 mg by mouth at bedtime.   lipase/protease/amylase (CREON) 36000 UNITS CPEP capsule Take 3 capsules with the first bite of each meal and 2 capsule with the first bite of each snack   Multiple Vitamins-Minerals (MULTIVITAMIN ADULT PO) Take 1 tablet by mouth.   omeprazole (PRILOSEC) 40 MG capsule Take 1 capsule (40 mg total) by  mouth daily before breakfast.   oxyCODONE-acetaminophen (PERCOCET) 10-325 MG tablet Take half to one tab po qd prn for acute and severe pain   prazosin (MINIPRESS) 1 MG capsule Take 1 mg by mouth at bedtime.   promethazine (PHENERGAN) 6.25 MG/5ML syrup TAKE  10 ML BY MOUTH EVERY 8 HOURS AS NEEDED FOR NAUSEA AND VOMITING OR  REFRACTORY  NAUSEA/VOMITING   [DISCONTINUED] amLODipine (NORVASC) 2.5 MG tablet Take 1 tablet (2.5 mg total) by mouth daily.   dicyclomine (BENTYL) 20 MG tablet Take 1 tablet (20 mg total) by mouth every 8 (eight) hours. As needed   gabapentin (NEURONTIN) 100 MG capsule Take 2 capsules (200 mg total) by mouth 2 (two) times daily. (Patient not taking: Reported on 11/11/2022)   No facility-administered encounter medications on file as of 12/29/2022.    Surgical History: Past Surgical History:  Procedure Laterality Date   ABDOMINAL SURGERY     BREAST BIOPSY Right 04/27/2020   stereo bx, coil clip, path pending    CHOLECYSTECTOMY     COLONOSCOPY WITH PROPOFOL N/A 09/08/2018   Procedure: COLONOSCOPY WITH PROPOFOL;  Surgeon: Pasty Spillers, MD;  Location: [facility] ENDOSCOPY;  Service: Endoscopy;  Laterality: N/A;   COLONOSCOPY WITH PROPOFOL N/A 08/29/2021   Procedure: COLONOSCOPY WITH PROPOFOL;  Surgeon: Toney Reil, MD;  Location: [facility];  Service: Endoscopy;  Laterality: N/A;   DECUBITUS ULCER EXCISION  DILATION AND CURETTAGE OF UTERUS     ESOPHAGOGASTRODUODENOSCOPY (EGD) WITH PROPOFOL N/A 09/08/2018   Procedure: ESOPHAGOGASTRODUODENOSCOPY (EGD) WITH PROPOFOL;  Surgeon: Pasty Spillers, MD;  Location: [facility] ENDOSCOPY;  Service: Endoscopy;  Laterality: N/A;   ESOPHAGOGASTRODUODENOSCOPY (EGD) WITH PROPOFOL N/A 08/29/2021   Procedure: ESOPHAGOGASTRODUODENOSCOPY (EGD) WITH PROPOFOL;  Surgeon: Toney Reil, MD;  Location: [facility];  Service: Endoscopy;  Laterality: N/A;   ESOPHAGOGASTRODUODENOSCOPY (EGD) WITH PROPOFOL N/A 04/10/2022    Procedure: ESOPHAGOGASTRODUODENOSCOPY (EGD) WITH PROPOFOL;  Surgeon: Toney Reil, MD;  Location: [facility] ENDOSCOPY;  Service: Gastroenterology;  Laterality: N/A;   GASTRIC BYPASS     GASTRIC BYPASS     REVISION GASTRIC RESTRICTIVE PROCEDURE FOR MORBID OBESITY      Medical History: Past Medical History:  Diagnosis Date   Anxiety    Asthma    Bilateral ovarian cysts    Chronic headaches    migraines about 1x/month   Depression    DVT (deep venous thrombosis) (HCC) 07/2014   right upper extremity   Hypothyroidism    Pancreatitis    Pancreatitis    PTSD (post-traumatic stress disorder)    Wears contact lenses     Family History: Family History  Problem Relation Age of Onset   Graves' disease Mother    Hypertension Mother    Hyperlipidemia Mother    Anxiety disorder Father    Prostate cancer Father    Depression Brother    Anxiety disorder Brother    Anxiety disorder Maternal Aunt    Depression Maternal Aunt    Anxiety disorder Paternal Aunt    Depression Paternal Aunt    Breast cancer Paternal Aunt    Anxiety disorder Maternal Uncle    Depression Maternal Uncle    Anxiety disorder Paternal Uncle    Depression Paternal Uncle    Anxiety disorder Maternal Grandfather    Depression Maternal Grandfather    Diabetes Maternal Grandfather    Anxiety disorder Maternal Grandmother    Depression Maternal Grandmother    Breast cancer Maternal Grandmother    Ovarian cancer Maternal Grandmother    Diabetes Maternal Grandmother    Colon cancer Neg Hx     Social History   Socioeconomic History   Marital status: Single    Spouse name: Not on file   Number of children: Not on file   Years of education: Not on file   Highest education level: Not on file  Occupational History   Not on file  Tobacco Use   Smoking status: Some Days    Packs/day: .25    Types: Cigarettes    Start date: 03/26/2005   Smokeless tobacco: Never   Tobacco comments:    2 cigarettes daily   Vaping Use   Vaping Use: Never used  Substance and Sexual Activity   Alcohol use: No    Alcohol/week: 0.0 standard drinks of alcohol   Drug use: Yes    Frequency: 3.0 times per week    Types: Marijuana    Comment: last used 2 weeks ago   Sexual activity: Not Currently    Birth control/protection: Injection  Other Topics Concern   Not on file  Social History Narrative   Not on file   Social Determinants of Health   Financial Resource Strain: Not on file  Food Insecurity: Not on file  Transportation Needs: Not on file  Physical Activity: Not on file  Stress: Not on file  Social Connections: Not on file  Intimate Partner Violence: Not  on file      Review of Systems  Constitutional:  Negative for chills, fatigue and unexpected weight change.  HENT:  Negative for congestion, postnasal drip, rhinorrhea, sneezing and sore throat.   Eyes:  Negative for redness.  Respiratory:  Negative for cough, chest tightness and shortness of breath.   Cardiovascular:  Negative for chest pain and palpitations.  Gastrointestinal:  Negative for abdominal pain, constipation, diarrhea, nausea and vomiting.  Genitourinary:  Negative for dysuria and frequency.  Musculoskeletal:  Positive for myalgias. Negative for arthralgias, back pain, joint swelling and neck pain.  Skin:  Negative for rash.  Neurological: Negative.  Negative for tremors and numbness.  Hematological:  Negative for adenopathy. Does not bruise/bleed easily.  Psychiatric/Behavioral:  Negative for behavioral problems (Depression), sleep disturbance and suicidal ideas. The patient is not nervous/anxious.     Vital Signs: BP (!) 130/95   Pulse (!) 101   Temp 98.3 F (36.8 C)   Resp 16   Ht 6\' 1"  (1.854 m)   Wt 185 lb 3.2 oz (84 kg)   SpO2 94%   BMI 24.43 kg/m    Physical Exam Constitutional:      General: She is not in acute distress.    Appearance: She is well-developed. She is not diaphoretic.  HENT:     Head:  Normocephalic and atraumatic.     Mouth/Throat:     Pharynx: No oropharyngeal exudate.  Eyes:     Pupils: Pupils are equal, round, and reactive to light.  Neck:     Thyroid: No thyromegaly.     Vascular: No JVD.     Trachea: No tracheal deviation.  Cardiovascular:     Rate and Rhythm: Normal rate and regular rhythm.     Heart sounds: Normal heart sounds. No murmur heard.    No friction rub. No gallop.  Pulmonary:     Effort: Pulmonary effort is normal. No respiratory distress.     Breath sounds: No wheezing or rales.  Chest:     Chest wall: No tenderness.  Abdominal:     General: Bowel sounds are normal.     Palpations: Abdomen is soft.  Musculoskeletal:        General: Normal range of motion.     Cervical back: Normal range of motion and neck supple.  Lymphadenopathy:     Cervical: No cervical adenopathy.  Skin:    General: Skin is warm and dry.  Neurological:     Mental Status: She is alert and oriented to person, place, and time.     Cranial Nerves: No cranial nerve deficit.  Psychiatric:        Behavior: Behavior normal.        Thought Content: Thought content normal.        Judgment: Judgment normal.        Assessment/Plan: 1. Essential hypertension Will increase to 10mg  amlodipine and if not improving will consider adding additional medication. Continue to monitor - amLODipine (NORVASC) 10 MG tablet; Take 1 tablet (10 mg total) by mouth daily.  Dispense: 90 tablet; Refill: 1  2. Menorrhagia with irregular cycle - Ambulatory referral to Obstetrics / Gynecology   General Counseling: Curly Rim understanding of the findings of todays visit and agrees with plan of treatment. I have discussed any further diagnostic evaluation that may be needed or ordered today. We also reviewed her medications today. she has been encouraged to call the office with any questions or concerns that should arise related to  todays visit.    Orders Placed This Encounter   Procedures   Ambulatory referral to Obstetrics / Gynecology    Meds ordered this encounter  Medications   amLODipine (NORVASC) 10 MG tablet    Sig: Take 1 tablet (10 mg total) by mouth daily.    Dispense:  90 tablet    Refill:  1    This patient was seen by Lynn Ito, PA-C in collaboration with Dr. Beverely Risen as a part of collaborative care agreement.   Total time spent:30 Minutes Time spent includes review of chart, medications, test results, and follow up plan with the patient.      Dr Lyndon Code Internal medicine

## 2022-12-30 ENCOUNTER — Telehealth: Payer: Self-pay | Admitting: Physician Assistant

## 2022-12-30 NOTE — Telephone Encounter (Signed)
GYN referral sent via Epic to [facility]

## 2023-01-02 DIAGNOSIS — F4011 Social phobia, generalized: Secondary | ICD-10-CM | POA: Diagnosis not present

## 2023-01-02 DIAGNOSIS — F4001 Agoraphobia with panic disorder: Secondary | ICD-10-CM | POA: Diagnosis not present

## 2023-01-02 DIAGNOSIS — F4312 Post-traumatic stress disorder, chronic: Secondary | ICD-10-CM | POA: Diagnosis not present

## 2023-01-02 DIAGNOSIS — F331 Major depressive disorder, recurrent, moderate: Secondary | ICD-10-CM | POA: Diagnosis not present

## 2023-01-30 DIAGNOSIS — F4312 Post-traumatic stress disorder, chronic: Secondary | ICD-10-CM | POA: Diagnosis not present

## 2023-01-30 DIAGNOSIS — F331 Major depressive disorder, recurrent, moderate: Secondary | ICD-10-CM | POA: Diagnosis not present

## 2023-01-30 DIAGNOSIS — F4011 Social phobia, generalized: Secondary | ICD-10-CM | POA: Diagnosis not present

## 2023-01-30 DIAGNOSIS — F4001 Agoraphobia with panic disorder: Secondary | ICD-10-CM | POA: Diagnosis not present

## 2023-02-02 ENCOUNTER — Ambulatory Visit (INDEPENDENT_AMBULATORY_CARE_PROVIDER_SITE_OTHER): Payer: BC Managed Care – PPO | Admitting: Physician Assistant

## 2023-02-02 ENCOUNTER — Encounter: Payer: Self-pay | Admitting: Physician Assistant

## 2023-02-02 VITALS — BP 130/92 | HR 63 | Temp 97.8°F | Resp 16 | Ht 72.0 in | Wt 185.0 lb

## 2023-02-02 DIAGNOSIS — I10 Essential (primary) hypertension: Secondary | ICD-10-CM

## 2023-02-02 DIAGNOSIS — Z833 Family history of diabetes mellitus: Secondary | ICD-10-CM

## 2023-02-02 DIAGNOSIS — G43909 Migraine, unspecified, not intractable, without status migrainosus: Secondary | ICD-10-CM

## 2023-02-02 MED ORDER — HYDROCHLOROTHIAZIDE 12.5 MG PO TABS
12.5000 mg | ORAL_TABLET | Freq: Every day | ORAL | 3 refills | Status: AC
Start: 2023-02-02 — End: ?

## 2023-02-02 MED ORDER — UBRELVY 100 MG PO TABS
ORAL_TABLET | ORAL | 2 refills | Status: DC
Start: 2023-02-02 — End: 2024-02-01

## 2023-02-02 NOTE — Progress Notes (Signed)
[facility] [address] [place], [place] [address]  Internal MEDICINE  Office Visit Note  Patient Name: Stacy Pineda  [id number]  [id number]  Date of Service: 02/18/2023  Chief Complaint  Patient presents with   Follow-up   Hypertension   Depression    HPI Pt is here for routine follow up for HTN -BP at home has been fluctuating some still, but is improving. Will start low dose hydrochlorothiazide with her amlodipine -Will check CMP, would also like to check A1c due to Fhx of DM -Has been having some migraines again, would like new script for [place] which she has taken before. Has been on preventives previously as well. Will start back with acute medication and monitor  Current Medication: Outpatient Encounter Medications as of 02/02/2023  Medication Sig   amLODipine (NORVASC) 10 MG tablet Take 1 tablet (10 mg total) by mouth daily.   buPROPion (WELLBUTRIN XL) 300 MG 24 hr tablet Take 300 mg by mouth every morning.   cyanocobalamin (VITAMIN B12) 1000 MCG/ML injection Inject every week for 4 weeks and then every other week for 2 months.   diazepam (VALIUM) 5 MG tablet Take 5 mg by mouth 3 (three) times daily as needed.   doxepin (SINEQUAN) 10 MG capsule Take 10 mg by mouth at bedtime.   DULoxetine (CYMBALTA) 30 MG capsule Take 60 mg by mouth daily.   DULoxetine (CYMBALTA) 60 MG capsule    ergocalciferol (DRISDOL) 1.25 MG (50000 UT) capsule Take one cap q week   folic acid (FOLVITE) 1 MG tablet Take 1 tablet (1 mg total) by mouth daily.   hydrochlorothiazide (HYDRODIURIL) 12.5 MG tablet Take 1 tablet (12.5 mg total) by mouth daily.   hydrocortisone (ANUSOL-HC) 2.5 % rectal cream Place 1 Application rectally 2 (two) times daily.   lamoTRIgine (LAMICTAL) 100 MG tablet Take 100 mg by mouth at bedtime.   lipase/protease/amylase (CREON) 36000 UNITS CPEP capsule Take 3 capsules with the first bite of each meal and 2 capsule with the first bite of each snack   Multiple  Vitamins-Minerals (MULTIVITAMIN ADULT PO) Take 1 tablet by mouth.   omeprazole (PRILOSEC) 40 MG capsule Take 1 capsule (40 mg total) by mouth daily before breakfast.   oxyCODONE-acetaminophen (PERCOCET) 10-325 MG tablet Take half to one tab po qd prn for acute and severe pain   prazosin (MINIPRESS) 1 MG capsule Take 1 mg by mouth at bedtime.   promethazine (PHENERGAN) 6.25 MG/5ML syrup TAKE  10 ML BY MOUTH EVERY 8 HOURS AS NEEDED FOR NAUSEA AND VOMITING OR  REFRACTORY  NAUSEA/VOMITING   Ubrogepant (UBRELVY) 100 MG TABS Take 1 tablet by mouth as needed for acute migraine. May take second tablet 2 hours later if needed.   [DISCONTINUED] clonazePAM (KLONOPIN) 0.5 MG tablet Take 0.5 mg by mouth 3 (three) times daily.   dicyclomine (BENTYL) 20 MG tablet Take 1 tablet (20 mg total) by mouth every 8 (eight) hours. As needed   gabapentin (NEURONTIN) 100 MG capsule Take 2 capsules (200 mg total) by mouth 2 (two) times daily. (Patient not taking: Reported on 11/11/2022)   No facility-administered encounter medications on file as of 02/02/2023.    Surgical History: Past Surgical History:  Procedure Laterality Date   ABDOMINAL SURGERY     BREAST BIOPSY Right 04/27/2020   stereo bx, coil clip, path pending    CHOLECYSTECTOMY     COLONOSCOPY WITH PROPOFOL N/A 09/08/2018   Procedure: COLONOSCOPY WITH PROPOFOL;  Surgeon: Pasty Spillers,  MD;  Location: [facility] ENDOSCOPY;  Service: Endoscopy;  Laterality: N/A;   COLONOSCOPY WITH PROPOFOL N/A 08/29/2021   Procedure: COLONOSCOPY WITH PROPOFOL;  Surgeon: Toney Reil, MD;  Location: [facility];  Service: Endoscopy;  Laterality: N/A;   DECUBITUS ULCER EXCISION     DILATION AND CURETTAGE OF UTERUS     ESOPHAGOGASTRODUODENOSCOPY (EGD) WITH PROPOFOL N/A 09/08/2018   Procedure: ESOPHAGOGASTRODUODENOSCOPY (EGD) WITH PROPOFOL;  Surgeon: Pasty Spillers, MD;  Location: [facility];  Service: Endoscopy;  Laterality: N/A;    ESOPHAGOGASTRODUODENOSCOPY (EGD) WITH PROPOFOL N/A 08/29/2021   Procedure: ESOPHAGOGASTRODUODENOSCOPY (EGD) WITH PROPOFOL;  Surgeon: Toney Reil, MD;  Location: [facility];  Service: Endoscopy;  Laterality: N/A;   ESOPHAGOGASTRODUODENOSCOPY (EGD) WITH PROPOFOL N/A 04/10/2022   Procedure: ESOPHAGOGASTRODUODENOSCOPY (EGD) WITH PROPOFOL;  Surgeon: Toney Reil, MD;  Location: [facility] END[facility];  Service: Gastroenterology;  Laterality: N/A;   GASTRIC BYPASS     GASTRIC BYPASS     REVISION GASTRIC RESTRICTIVE PROCEDURE FOR MORBID OBESITY      Medical History: Past Medical History:  Diagnosis Date   Anxiety    Asthma    Bilateral ovarian cysts    Chronic headaches    migraines about 1x/month   Depression    DVT (deep venous thrombosis) (HCC) 07/2014   right upper extremity   Hypothyroidism    Pancreatitis    Pancreatitis    PTSD (post-traumatic stress disorder)    Wears contact lenses     Family History: Family History  Problem Relation Age of Onset   Graves' disease Mother    Hypertension Mother    Hyperlipidemia Mother    Anxiety disorder Father    Prostate cancer Father    Depression Brother    Anxiety disorder Brother    Anxiety disorder Maternal Aunt    Depression Maternal Aunt    Anxiety disorder Paternal Aunt    Depression Paternal Aunt    Breast cancer Paternal Aunt    Anxiety disorder Maternal Uncle    Depression Maternal Uncle    Anxiety disorder Paternal Uncle    Depression Paternal Uncle    Anxiety disorder Maternal Grandfather    Depression Maternal Grandfather    Diabetes Maternal Grandfather    Anxiety disorder Maternal Grandmother    Depression Maternal Grandmother    Breast cancer Maternal Grandmother    Ovarian cancer Maternal Grandmother    Diabetes Maternal Grandmother    Colon cancer Neg Hx     Social History   Socioeconomic History   Marital status: Single    Spouse name: Not on file   Number of children: Not on file    Years of education: Not on file   Highest education level: Not on file  Occupational History   Not on file  Tobacco Use   Smoking status: Some Days    Packs/day: .25    Types: Cigarettes    Start date: 03/26/2005   Smokeless tobacco: Never   Tobacco comments:    2 cigarettes daily  Vaping Use   Vaping Use: Never used  Substance and Sexual Activity   Alcohol use: No    Alcohol/week: 0.0 standard drinks of alcohol   Drug use: Yes    Frequency: 3.0 times per week    Types: Marijuana    Comment: last used 2 weeks ago   Sexual activity: Not Currently    Birth control/protection: Injection  Other Topics Concern   Not on file  Social History Narrative   Not  on file   Social Determinants of Health   Financial Resource Strain: Not on file  Food Insecurity: Not on file  Transportation Needs: Not on file  Physical Activity: Not on file  Stress: Not on file  Social Connections: Not on file  Intimate Partner Violence: Not on file      Review of Systems  Constitutional:  Negative for chills, fatigue and unexpected weight change.  HENT:  Negative for congestion, postnasal drip, rhinorrhea, sneezing and sore throat.   Eyes:  Negative for redness.  Respiratory:  Negative for cough, chest tightness and shortness of breath.   Cardiovascular:  Negative for chest pain and palpitations.  Gastrointestinal:  Negative for abdominal pain, constipation, diarrhea, nausea and vomiting.  Genitourinary:  Negative for dysuria and frequency.  Musculoskeletal:  Positive for myalgias. Negative for arthralgias, back pain, joint swelling and neck pain.  Skin:  Negative for rash.  Neurological:  Positive for headaches. Negative for tremors and numbness.  Hematological:  Negative for adenopathy. Does not bruise/bleed easily.  Psychiatric/Behavioral:  Negative for behavioral problems (Depression), sleep disturbance and suicidal ideas. The patient is not nervous/anxious.     Vital Signs: BP (!) 130/92    Pulse 63   Temp 97.8 F (36.6 C)   Resp 16   Ht 6' (1.829 m)   Wt 185 lb (83.9 kg)   SpO2 99%   BMI 25.09 kg/m    Physical Exam Constitutional:      General: She is not in acute distress.    Appearance: She is well-developed. She is not diaphoretic.  HENT:     Head: Normocephalic and atraumatic.     Mouth/Throat:     Pharynx: No oropharyngeal exudate.  Eyes:     Pupils: Pupils are equal, round, and reactive to light.  Neck:     Thyroid: No thyromegaly.     Vascular: No JVD.     Trachea: No tracheal deviation.  Cardiovascular:     Rate and Rhythm: Normal rate and regular rhythm.     Heart sounds: Normal heart sounds. No murmur heard.    No friction rub. No gallop.  Pulmonary:     Effort: Pulmonary effort is normal. No respiratory distress.     Breath sounds: No wheezing or rales.  Chest:     Chest wall: No tenderness.  Abdominal:     General: Bowel sounds are normal.     Palpations: Abdomen is soft.  Musculoskeletal:        General: Normal range of motion.     Cervical back: Normal range of motion and neck supple.  Lymphadenopathy:     Cervical: No cervical adenopathy.  Skin:    General: Skin is warm and dry.  Neurological:     Mental Status: She is alert and oriented to person, place, and time.     Cranial Nerves: No cranial nerve deficit.  Psychiatric:        Behavior: Behavior normal.        Thought Content: Thought content normal.        Judgment: Judgment normal.        Assessment/Plan: 1. Essential hypertension Will continue amlodipine and add hydrochlorothiazide, will check labs - hydrochlorothiazide (HYDRODIURIL) 12.5 MG tablet; Take 1 tablet (12.5 mg total) by mouth daily.  Dispense: 90 tablet; Refill: 3 - Comprehensive metabolic panel  2. Migraine without status migrainosus, not intractable, unspecified migraine type - Ubrogepant (UBRELVY) 100 MG TABS; Take 1 tablet by mouth as needed for acute  migraine. May take second tablet 2 hours later  if needed.  Dispense: 16 tablet; Refill: 2  3. Family history of diabetes mellitus - Hgb A1C w/o eAG   General Counseling: Caryssa verbalizes understanding of the findings of todays visit and agrees with plan of treatment. I have discussed any further diagnostic evaluation that may be needed or ordered today. We also reviewed her medications today. she has been encouraged to call the office with any questions or concerns that should arise related to todays visit.    Orders Placed This Encounter  Procedures   Comprehensive metabolic panel   Hgb A1C w/o eAG    Meds ordered this encounter  Medications   hydrochlorothiazide (HYDRODIURIL) 12.5 MG tablet    Sig: Take 1 tablet (12.5 mg total) by mouth daily.    Dispense:  90 tablet    Refill:  3   Ubrogepant (UBRELVY) 100 MG TABS    Sig: Take 1 tablet by mouth as needed for acute migraine. May take second tablet 2 hours later if needed.    Dispense:  16 tablet    Refill:  2    This patient was seen by Lynn Ito, PA-C in collaboration with Dr. Beverely Risen as a part of collaborative care agreement.   Total time spent:30 Minutes Time spent includes review of chart, medications, test results, and follow up plan with the patient.      Dr Lyndon Code Internal medicine

## 2023-02-06 ENCOUNTER — Encounter: Payer: Self-pay | Admitting: Certified Nurse Midwife

## 2023-02-06 ENCOUNTER — Ambulatory Visit (INDEPENDENT_AMBULATORY_CARE_PROVIDER_SITE_OTHER): Payer: BC Managed Care – PPO | Admitting: Certified Nurse Midwife

## 2023-02-06 VITALS — BP 130/88 | HR 82 | Wt 181.2 lb

## 2023-02-06 DIAGNOSIS — N920 Excessive and frequent menstruation with regular cycle: Secondary | ICD-10-CM

## 2023-02-06 DIAGNOSIS — Z30013 Encounter for initial prescription of injectable contraceptive: Secondary | ICD-10-CM | POA: Diagnosis not present

## 2023-02-06 DIAGNOSIS — Z3202 Encounter for pregnancy test, result negative: Secondary | ICD-10-CM

## 2023-02-06 LAB — POCT URINE PREGNANCY: Preg Test, Ur: NEGATIVE

## 2023-02-06 MED ORDER — "SYRINGE 25G X 1"" 3 ML MISC": 1.0000 | Freq: Once | 3 refills | Status: AC

## 2023-02-06 MED ORDER — MEDROXYPROGESTERONE ACETATE 150 MG/ML IM SUSP: 150.0000 mg | Freq: Once | INTRAMUSCULAR | 3 refills | Status: DC

## 2023-02-06 NOTE — Progress Notes (Signed)
GYN ENCOUNTER NOTE  Subjective:       Stacy Pineda is a 42 y.o. G12P0010 female is here for gynecologic evaluation of the following issues:  1. Heavy long painful periods, pt states she has had this problem since started having her periods. She at one time was managing with depo injections which worked well for her. She stopped using this because the provider she was seeing left the practice.    Gynecologic History Patient's last menstrual period was 01/10/2023 (exact date). Contraception: none Last Pap: 11/24/2019. Results were: normal/neg HPV Last mammogram: 04/13/2020. Results were: abnormal  Obstetric History OB History  Gravida Para Term Preterm AB Living  1       1    SAB IAB Ectopic Multiple Live Births  1            # Outcome Date GA Lbr Len/2nd Weight Sex Delivery Anes PTL Lv  1 SAB 1999            Past Medical History:  Diagnosis Date   Anxiety    Asthma    Bilateral ovarian cysts    Chronic headaches    migraines about 1x/month   Depression    DVT (deep venous thrombosis) (HCC) 07/2014   right upper extremity   Hypothyroidism    Pancreatitis    Pancreatitis    PTSD (post-traumatic stress disorder)    Wears contact lenses     Past Surgical History:  Procedure Laterality Date   ABDOMINAL SURGERY     BREAST BIOPSY Right 04/27/2020   stereo bx, coil clip, path pending    CHOLECYSTECTOMY     COLONOSCOPY WITH PROPOFOL N/A 09/08/2018   Procedure: COLONOSCOPY WITH PROPOFOL;  Surgeon: Pasty Spillers, MD;  Location: [facility];  Service: Endoscopy;  Laterality: N/A;   COLONOSCOPY WITH PROPOFOL N/A 08/29/2021   Procedure: COLONOSCOPY WITH PROPOFOL;  Surgeon: Toney Reil, MD;  Location: [facility];  Service: Endoscopy;  Laterality: N/A;   DECUBITUS ULCER EXCISION     DILATION AND CURETTAGE OF UTERUS     ESOPHAGOGASTRODUODENOSCOPY (EGD) WITH PROPOFOL N/A 09/08/2018   Procedure: ESOPHAGOGASTRODUODENOSCOPY (EGD) WITH PROPOFOL;  Surgeon:  Pasty Spillers, MD;  Location: [facility];  Service: Endoscopy;  Laterality: N/A;   ESOPHAGOGASTRODUODENOSCOPY (EGD) WITH PROPOFOL N/A 08/29/2021   Procedure: ESOPHAGOGASTRODUODENOSCOPY (EGD) WITH PROPOFOL;  Surgeon: Toney Reil, MD;  Location: [facility];  Service: Endoscopy;  Laterality: N/A;   ESOPHAGOGASTRODUODENOSCOPY (EGD) WITH PROPOFOL N/A 04/10/2022   Procedure: ESOPHAGOGASTRODUODENOSCOPY (EGD) WITH PROPOFOL;  Surgeon: Toney Reil, MD;  Location: [facility];  Service: Gastroenterology;  Laterality: N/A;   GASTRIC BYPASS     GASTRIC BYPASS     REVISION GASTRIC RESTRICTIVE PROCEDURE FOR MORBID OBESITY      Current Outpatient Medications on File Prior to Visit  Medication Sig Dispense Refill   amLODipine (NORVASC) 10 MG tablet Take 1 tablet (10 mg total) by mouth daily. 90 tablet 1   buPROPion (WELLBUTRIN XL) 300 MG 24 hr tablet Take 300 mg by mouth every morning.     clonazePAM (KLONOPIN) 1 MG tablet Take 1 mg by mouth 2 (two) times daily as needed.     cyanocobalamin (VITAMIN B12) 1000 MCG/ML injection Inject every week for 4 weeks and then every other week for 2 months. 4 mL 3   diazepam (VALIUM) 5 MG tablet Take 5 mg by mouth 3 (three) times daily as needed.     doxepin (SINEQUAN) 10 MG capsule  Take 10 mg by mouth at bedtime.     DULoxetine (CYMBALTA) 30 MG capsule Take 60 mg by mouth daily.     DULoxetine (CYMBALTA) 60 MG capsule      ergocalciferol (DRISDOL) 1.25 MG (50000 UT) capsule Take one cap q week 12 capsule 3   folic acid (FOLVITE) 1 MG tablet Take 1 tablet (1 mg total) by mouth daily. 90 tablet 1   hydrochlorothiazide (HYDRODIURIL) 12.5 MG tablet Take 1 tablet (12.5 mg total) by mouth daily. 90 tablet 3   hydrocortisone (ANUSOL-HC) 2.5 % rectal cream Place 1 Application rectally 2 (two) times daily. 30 g 1   lamoTRIgine (LAMICTAL) 100 MG tablet Take 100 mg by mouth at bedtime.     lipase/protease/amylase (CREON) 36000 UNITS CPEP  capsule Take 3 capsules with the first bite of each meal and 2 capsule with the first bite of each snack 330 capsule 5   Multiple Vitamins-Minerals (MULTIVITAMIN ADULT PO) Take 1 tablet by mouth.     omeprazole (PRILOSEC) 40 MG capsule Take 1 capsule (40 mg total) by mouth daily before breakfast. 90 capsule 3   oxyCODONE-acetaminophen (PERCOCET) 10-325 MG tablet Take half to one tab po qd prn for acute and severe pain 30 tablet 0   prazosin (MINIPRESS) 1 MG capsule Take 1 mg by mouth at bedtime.     promethazine (PHENERGAN) 6.25 MG/5ML syrup TAKE  10 ML BY MOUTH EVERY 8 HOURS AS NEEDED FOR NAUSEA AND VOMITING OR  REFRACTORY  NAUSEA/VOMITING 240 mL 2   Ubrogepant (UBRELVY) 100 MG TABS Take 1 tablet by mouth as needed for acute migraine. May take second tablet 2 hours later if needed. 16 tablet 2   Vilazodone HCl (VIIBRYD) 40 MG TABS Take 40 mg by mouth daily.     dicyclomine (BENTYL) 20 MG tablet Take 1 tablet (20 mg total) by mouth every 8 (eight) hours. As needed 120 tablet 1   gabapentin (NEURONTIN) 100 MG capsule Take 2 capsules (200 mg total) by mouth 2 (two) times daily. (Patient not taking: Reported on 11/11/2022) 120 capsule 0   No current facility-administered medications on file prior to visit.    Allergies  Allergen Reactions   Fentanyl Itching   Morphine And Codeine Itching    Itchy.    Vicodin [Hydrocodone-Acetaminophen] Itching    Social History   Socioeconomic History   Marital status: Single    Spouse name: Not on file   Number of children: Not on file   Years of education: Not on file   Highest education level: Not on file  Occupational History   Not on file  Tobacco Use   Smoking status: Some Days    Packs/day: .25    Types: Cigarettes    Start date: 03/26/2005   Smokeless tobacco: Never   Tobacco comments:    2 cigarettes daily  Vaping Use   Vaping Use: Never used  Substance and Sexual Activity   Alcohol use: No    Alcohol/week: 0.0 standard drinks of  alcohol   Drug use: Yes    Frequency: 3.0 times per week    Types: Marijuana    Comment: last used 2 weeks ago   Sexual activity: Not Currently    Birth control/protection: Injection  Other Topics Concern   Not on file  Social History Narrative   Not on file   Social Determinants of Health   Financial Resource Strain: Not on file  Food Insecurity: Not on file  Transportation Needs: Not  on file  Physical Activity: Not on file  Stress: Not on file  Social Connections: Not on file  Intimate Partner Violence: Not on file    Family History  Problem Relation Age of Onset   Graves' disease Mother    Hypertension Mother    Hyperlipidemia Mother    Anxiety disorder Father    Prostate cancer Father    Depression Brother    Anxiety disorder Brother    Anxiety disorder Maternal Aunt    Depression Maternal Aunt    Anxiety disorder Paternal Aunt    Depression Paternal Aunt    Breast cancer Paternal Aunt    Anxiety disorder Maternal Uncle    Depression Maternal Uncle    Anxiety disorder Paternal Uncle    Depression Paternal Uncle    Anxiety disorder Maternal Grandfather    Depression Maternal Grandfather    Diabetes Maternal Grandfather    Anxiety disorder Maternal Grandmother    Depression Maternal Grandmother    Breast cancer Maternal Grandmother    Ovarian cancer Maternal Grandmother    Diabetes Maternal Grandmother    Colon cancer Neg Hx     The following portions of the patient's history were reviewed and updated as appropriate: allergies, current medications, past family history, past medical history, past social history, past surgical history and problem list.  Review of Systems Review of Systems - Negative except as mentioned in HPI Review of Systems - General ROS: negative for - chills, fatigue, fever, hot flashes, malaise or night sweats Hematological and Lymphatic ROS: negative for - bleeding problems or swollen lymph nodes Gastrointestinal ROS: negative for -  abdominal pain, blood in stools, change in bowel habits and nausea/vomiting Musculoskeletal ROS: negative for - joint pain, muscle pain or muscular weakness Genito-Urinary ROS: negative for - change in menstrual cycle, dysmenorrhea, dyspareunia, dysuria, genital discharge, genital ulcers, hematuria, incontinence, irregula, nocturia or pelvic pain. Positive for heavy painful periods   Objective:   BP 130/88   Pulse 82   Wt 181 lb 3.2 oz (82.2 kg)   LMP 01/10/2023 (Exact Date)   BMI 24.58 kg/m  CONSTITUTIONAL: Well-developed, well-nourished female in no acute distress.  HENT:  Normocephalic, atraumatic.  NECK: Normal range of motion, supple, no masses.  Normal thyroid.  SKIN: Skin is warm and dry. No rash noted. Not diaphoretic. No erythema. No pallor. NEUROLGIC: Alert and oriented to person, place, and time. PSYCHIATRIC: Normal mood and affect. Normal behavior. Normal judgment and thought content. CARDIOVASCULAR:Not Examined RESPIRATORY: Not Examined BREASTS: Not Examined ABDOMEN: Soft, non distended; Non tender.  No Organomegaly. PELVIC: deferred,  Will do at annual exam MUSCULOSKELETAL: Normal range of motion. No tenderness.  No cyanosis, clubbing, or edema.     Assessment:   Menorrhagia    Plan:   Pelvic ultrasound to evaluate for possible causes. Pt state she has family history of fibroids. She has history of ovarian cysts. Discussed use of BC to manage cycle. Given her history of smoking and hypertension advised avoidance of estrogen containing BC. She would like to use Depo again. She states her sister is a Engineer, civil (consulting) and she gives her her vitamin B injections. She would like to have her sister administer her depo injections at home. UPT negative today.  Orders placed for depo injections.   Doreene Burke, CNM

## 2023-02-06 NOTE — Patient Instructions (Signed)
Abnormal Uterine Bleeding Abnormal uterine bleeding means bleeding more than normal from your womb (uterus). It can include: Bleeding after sex. Bleeding between monthly (menstrual) periods. Bleeding that is heavier than normal. Monthly periods that last longer than normal. Bleeding after you have stopped having your monthly period (menopause). You should see a doctor for any kind of bleeding that is not normal. Treatment depends on the cause of your bleeding and how much you bleed. Follow these instructions at home: Medicines Take over-the-counter and prescription medicines only as told by your doctor. Ask your doctor about: Taking medicines such as aspirin and ibuprofen. Do not take these medicines unless your doctor tells you to take them. Taking over-the-counter medicines, vitamins, herbs, and supplements. You may be given iron pills. Take them as told by your doctor. Managing constipation If you take iron pills, you may need to take these actions to prevent or treat trouble pooping (constipation): Drink enough fluid to keep your pee (urine) pale yellow. Take over-the-counter or prescription medicines. Eat foods that are high in fiber. These include beans, whole grains, and fresh fruits and vegetables. Limit foods that are high in fat and sugar. These include fried or sweet foods. Activity Change your activity to decrease bleeding if you need to change your sanitary pad more than one time every 2 hours: Lie in bed with your feet raised (elevated). Place a cold pack on your lower belly. Rest as much as you are able until the bleeding stops or slows down. General instructions Do not use tampons, douche, or have sex until your doctor says these things are okay. Change your pads often. Get regular exams. These include: Pelvic exams. Screenings for cancer of the cervix. It is up to you to get the results of any tests that are done. Ask how to get your results when they are  ready. Watch for any changes in your bleeding. For 2 months, write down: When your monthly period starts. When your monthly period ends. When you get any abnormal bleeding from your vagina. What problems you notice. Keep all follow-up visits. Contact a doctor if: The bleeding lasts more than one week. You feel dizzy at times. You feel like you may vomit (nausea). You vomit. You feel light-headed or weak. Your symptoms get worse. Get help right away if: You faint. You have to change pads every hour. You have pain in your belly. You have a fever or chills. You get sweaty or weak. You pass large blood clots from your vagina. These symptoms may be an emergency. Get help right away. Call your local emergency services (911 in the [place] Do not wait to see if the symptoms will go away. Do not drive yourself to the hospital. Summary Abnormal uterine bleeding means bleeding more than normal from your womb (uterus). Any kind of bleeding that is not normal should be checked by a doctor. Treatment depends on the cause of your bleeding and how much you bleed. Get help right away if you faint, you have to change pads every hour, or you pass large blood clots from your vagina. This information is not intended to replace advice given to you by your health care provider. Make sure you discuss any questions you have with your health care provider. Document Revised: 11/27/2020 Document Reviewed: 11/27/2020 [employer] Patient Education  2024 [employer]  

## 2023-02-26 ENCOUNTER — Encounter: Payer: Self-pay | Admitting: Certified Nurse Midwife

## 2023-03-02 ENCOUNTER — Ambulatory Visit: Payer: BC Managed Care – PPO | Admitting: Physician Assistant

## 2023-03-06 ENCOUNTER — Encounter: Payer: Self-pay | Admitting: Oncology

## 2023-03-06 DIAGNOSIS — F331 Major depressive disorder, recurrent, moderate: Secondary | ICD-10-CM | POA: Diagnosis not present

## 2023-03-06 DIAGNOSIS — G4700 Insomnia, unspecified: Secondary | ICD-10-CM | POA: Diagnosis not present

## 2023-03-06 DIAGNOSIS — F4001 Agoraphobia with panic disorder: Secondary | ICD-10-CM | POA: Diagnosis not present

## 2023-03-06 DIAGNOSIS — F4312 Post-traumatic stress disorder, chronic: Secondary | ICD-10-CM | POA: Diagnosis not present

## 2023-03-11 ENCOUNTER — Ambulatory Visit: Payer: BC Managed Care – PPO | Admitting: Certified Nurse Midwife

## 2023-03-16 ENCOUNTER — Other Ambulatory Visit: Payer: Medicare Other

## 2023-03-17 ENCOUNTER — Ambulatory Visit: Payer: Medicare Other | Admitting: Nurse Practitioner

## 2023-03-17 ENCOUNTER — Ambulatory Visit: Payer: Medicare Other

## 2023-03-17 ENCOUNTER — Ambulatory Visit: Payer: Medicare Other | Admitting: Oncology

## 2023-03-27 DIAGNOSIS — I10 Essential (primary) hypertension: Secondary | ICD-10-CM | POA: Diagnosis not present

## 2023-03-27 DIAGNOSIS — Z833 Family history of diabetes mellitus: Secondary | ICD-10-CM | POA: Diagnosis not present

## 2023-03-28 LAB — COMPREHENSIVE METABOLIC PANEL
Chloride: 106 mmol/L (ref 96–106)
Total Protein: 6.6 g/dL (ref 6.0–8.5)

## 2023-03-30 ENCOUNTER — Encounter: Payer: Self-pay | Admitting: Physician Assistant

## 2023-03-30 ENCOUNTER — Ambulatory Visit (INDEPENDENT_AMBULATORY_CARE_PROVIDER_SITE_OTHER): Payer: BC Managed Care – PPO | Admitting: Physician Assistant

## 2023-03-30 VITALS — BP 120/85 | HR 85 | Temp 98.2°F | Resp 16 | Ht 72.0 in | Wt 175.0 lb

## 2023-03-30 DIAGNOSIS — R42 Dizziness and giddiness: Secondary | ICD-10-CM

## 2023-03-30 DIAGNOSIS — R110 Nausea: Secondary | ICD-10-CM | POA: Diagnosis not present

## 2023-03-30 DIAGNOSIS — I10 Essential (primary) hypertension: Secondary | ICD-10-CM | POA: Diagnosis not present

## 2023-03-30 DIAGNOSIS — K8689 Other specified diseases of pancreas: Secondary | ICD-10-CM | POA: Diagnosis not present

## 2023-03-30 NOTE — Progress Notes (Signed)
[facility] [address] [place], [place] [address]  Internal MEDICINE  Office Visit Note  Patient Name: Stacy Pineda  [id number]  [id number]  Date of Service: 04/08/2023  Chief Complaint  Patient presents with   Follow-up   Depression   Hypertension    HPI Pt is here for routine follow up -down 10lbs since last visit. Hx of pancreatitis and comes in flares where she cannot eat as much -Has also fallen 2x since last visit, a little more dizzy. Happened more than a month after hydrochlorothiazide added, thinks more so releated to nausea and inability to keep food down than due to medication. Labs also checked and stable since hydrochlorothiazide added. -A1c is borderline low -has a hard time with her creon, unable to swallow these very well or keep these down.  -She is going to contact her GI provider to discuss further -BP is stable, but monitor closely to ensure it does not contribute to dizziness.  Current Medication: Outpatient Encounter Medications as of 03/30/2023  Medication Sig   amLODipine (NORVASC) 10 MG tablet Take 1 tablet (10 mg total) by mouth daily.   buPROPion (WELLBUTRIN XL) 300 MG 24 hr tablet Take 300 mg by mouth every morning.   clonazePAM (KLONOPIN) 1 MG tablet Take 1 mg by mouth 2 (two) times daily as needed.   cyanocobalamin (VITAMIN B12) 1000 MCG/ML injection Inject every week for 4 weeks and then every other week for 2 months.   diazepam (VALIUM) 5 MG tablet Take 5 mg by mouth 3 (three) times daily as needed.   doxepin (SINEQUAN) 10 MG capsule Take 10 mg by mouth at bedtime.   DULoxetine (CYMBALTA) 30 MG capsule Take 60 mg by mouth daily.   DULoxetine (CYMBALTA) 60 MG capsule    ergocalciferol (DRISDOL) 1.25 MG (50000 UT) capsule Take one cap q week   folic acid (FOLVITE) 1 MG tablet Take 1 tablet (1 mg total) by mouth daily.   hydrochlorothiazide (HYDRODIURIL) 12.5 MG tablet Take 1 tablet (12.5 mg total) by mouth daily.   hydrocortisone  (ANUSOL-HC) 2.5 % rectal cream Place 1 Application rectally 2 (two) times daily.   lamoTRIgine (LAMICTAL) 100 MG tablet Take 100 mg by mouth at bedtime.   lipase/protease/amylase (CREON) 36000 UNITS CPEP capsule Take 3 capsules with the first bite of each meal and 2 capsule with the first bite of each snack   Multiple Vitamins-Minerals (MULTIVITAMIN ADULT PO) Take 1 tablet by mouth.   omeprazole (PRILOSEC) 40 MG capsule Take 1 capsule (40 mg total) by mouth daily before breakfast.   oxyCODONE-acetaminophen (PERCOCET) 10-325 MG tablet Take half to one tab po qd prn for acute and severe pain   prazosin (MINIPRESS) 1 MG capsule Take 1 mg by mouth at bedtime.   promethazine (PHENERGAN) 6.25 MG/5ML syrup TAKE  10 ML BY MOUTH EVERY 8 HOURS AS NEEDED FOR NAUSEA AND VOMITING OR  REFRACTORY  NAUSEA/VOMITING   Ubrogepant (UBRELVY) 100 MG TABS Take 1 tablet by mouth as needed for acute migraine. May take second tablet 2 hours later if needed.   Vilazodone HCl (VIIBRYD) 40 MG TABS Take 40 mg by mouth daily.   dicyclomine (BENTYL) 20 MG tablet Take 1 tablet (20 mg total) by mouth every 8 (eight) hours. As needed   gabapentin (NEURONTIN) 100 MG capsule Take 2 capsules (200 mg total) by mouth 2 (two) times daily. (Patient not taking: Reported on 11/11/2022)   medroxyPROGESTERone (DEPO-PROVERA) 150 MG/ML injection Inject 1 mL (150  mg total) into the muscle once for 1 dose.   No facility-administered encounter medications on file as of 03/30/2023.    Surgical History: Past Surgical History:  Procedure Laterality Date   ABDOMINAL SURGERY     BREAST BIOPSY Right 04/27/2020   stereo bx, coil clip, path pending    CHOLECYSTECTOMY     COLONOSCOPY WITH PROPOFOL N/A 09/08/2018   Procedure: COLONOSCOPY WITH PROPOFOL;  Surgeon: Pasty Spillers, MD;  Location: [facility];  Service: Endoscopy;  Laterality: N/A;   COLONOSCOPY WITH PROPOFOL N/A 08/29/2021   Procedure: COLONOSCOPY WITH PROPOFOL;  Surgeon: Toney Reil, MD;  Location: [facility];  Service: Endoscopy;  Laterality: N/A;   DECUBITUS ULCER EXCISION     DILATION AND CURETTAGE OF UTERUS     ESOPHAGOGASTRODUODENOSCOPY (EGD) WITH PROPOFOL N/A 09/08/2018   Procedure: ESOPHAGOGASTRODUODENOSCOPY (EGD) WITH PROPOFOL;  Surgeon: Pasty Spillers, MD;  Location: [facility];  Service: Endoscopy;  Laterality: N/A;   ESOPHAGOGASTRODUODENOSCOPY (EGD) WITH PROPOFOL N/A 08/29/2021   Procedure: ESOPHAGOGASTRODUODENOSCOPY (EGD) WITH PROPOFOL;  Surgeon: Toney Reil, MD;  Location: [facility];  Service: Endoscopy;  Laterality: N/A;   ESOPHAGOGASTRODUODENOSCOPY (EGD) WITH PROPOFOL N/A 04/10/2022   Procedure: ESOPHAGOGASTRODUODENOSCOPY (EGD) WITH PROPOFOL;  Surgeon: Toney Reil, MD;  Location: [facility];  Service: Gastroenterology;  Laterality: N/A;   GASTRIC BYPASS     GASTRIC BYPASS     REVISION GASTRIC RESTRICTIVE PROCEDURE FOR MORBID OBESITY      Medical History: Past Medical History:  Diagnosis Date   Anxiety    Asthma    Bilateral ovarian cysts    Chronic headaches    migraines about 1x/month   Depression    DVT (deep venous thrombosis) (HCC) 07/2014   right upper extremity   Hypothyroidism    Pancreatitis    Pancreatitis    PTSD (post-traumatic stress disorder)    Wears contact lenses     Family History: Family History  Problem Relation Age of Onset   Graves' disease Mother    Hypertension Mother    Hyperlipidemia Mother    Anxiety disorder Father    Prostate cancer Father    Depression Brother    Anxiety disorder Brother    Anxiety disorder Maternal Aunt    Depression Maternal Aunt    Anxiety disorder Paternal Aunt    Depression Paternal Aunt    Breast cancer Paternal Aunt    Anxiety disorder Maternal Uncle    Depression Maternal Uncle    Anxiety disorder Paternal Uncle    Depression Paternal Uncle    Anxiety disorder Maternal Grandfather    Depression Maternal Grandfather     Diabetes Maternal Grandfather    Anxiety disorder Maternal Grandmother    Depression Maternal Grandmother    Breast cancer Maternal Grandmother    Ovarian cancer Maternal Grandmother    Diabetes Maternal Grandmother    Colon cancer Neg Hx     Social History   Socioeconomic History   Marital status: Single    Spouse name: Not on file   Number of children: Not on file   Years of education: Not on file   Highest education level: Not on file  Occupational History   Not on file  Tobacco Use   Smoking status: Some Days    Current packs/day: 0.25    Average packs/day: 0.3 packs/day for 18.0 years (4.5 ttl pk-yrs)    Types: Cigarettes    Start date: 03/26/2005   Smokeless tobacco: Never   Tobacco  comments:    2 cigarettes daily  Vaping Use   Vaping status: Never Used  Substance and Sexual Activity   Alcohol use: No    Alcohol/week: 0.0 standard drinks of alcohol   Drug use: Yes    Frequency: 3.0 times per week    Types: Marijuana    Comment: last used 2 weeks ago   Sexual activity: Not Currently    Birth control/protection: Injection  Other Topics Concern   Not on file  Social History Narrative   Not on file   Social Determinants of Health   Financial Resource Strain: High Risk (05/06/2019)   Received from [facility], [facility]   Overall Financial Resource Strain (CARDIA)    Difficulty of Paying Living Expenses: Hard  Food Insecurity: Food Insecurity Present (05/06/2019)   Received from [facility], [facility]   Hunger Vital Sign    Worried About Running Out of Food in the Last Year: Never true    Ran Out of Food in the Last Year: Sometimes true  Transportation Needs: No Transportation Needs (05/06/2019)   Received from [facility], [facility]   [facility] - Transportation    Lack of Transportation (Medical): No    Lack of Transportation (Non-Medical): No  Physical Activity: Not on file  Stress: Not on file  Social Connections: Not on  file  Intimate Partner Violence: Not on file      Review of Systems  Constitutional:  Positive for unexpected weight change. Negative for chills and fatigue.  HENT:  Positive for postnasal drip. Negative for congestion, rhinorrhea, sneezing and sore throat.   Eyes:  Negative for redness.  Respiratory:  Negative for cough, chest tightness and shortness of breath.   Cardiovascular:  Negative for chest pain and palpitations.  Gastrointestinal:  Positive for abdominal pain and nausea. Negative for constipation and diarrhea.  Genitourinary:  Negative for dysuria and frequency.  Musculoskeletal:  Negative for arthralgias, back pain, joint swelling and neck pain.  Skin:  Negative for rash.  Neurological:  Positive for dizziness. Negative for tremors and numbness.  Hematological:  Negative for adenopathy. Does not bruise/bleed easily.  Psychiatric/Behavioral:  Negative for behavioral problems (Depression), sleep disturbance and suicidal ideas. The patient is not nervous/anxious.     Vital Signs: BP 120/85   Pulse 85   Temp 98.2 F (36.8 C)   Resp 16   Ht 6' (1.829 m)   Wt 175 lb (79.4 kg)   SpO2 98%   BMI 23.73 kg/m    Physical Exam Vitals and nursing note reviewed.  Constitutional:      General: She is not in acute distress.    Appearance: Normal appearance. She is well-developed. She is not diaphoretic.  HENT:     Head: Normocephalic and atraumatic.     Mouth/Throat:     Pharynx: No oropharyngeal exudate.  Eyes:     Pupils: Pupils are equal, round, and reactive to light.  Neck:     Thyroid: No thyromegaly.     Vascular: No JVD.     Trachea: No tracheal deviation.  Cardiovascular:     Rate and Rhythm: Normal rate and regular rhythm.     Heart sounds: Normal heart sounds. No murmur heard.    No friction rub. No gallop.  Pulmonary:     Effort: Pulmonary effort is normal. No respiratory distress.     Breath sounds: No wheezing or rales.  Chest:     Chest wall:  No  tenderness.  Abdominal:     General: Bowel sounds are normal.     Palpations: Abdomen is soft.  Musculoskeletal:        General: Normal range of motion.     Cervical back: Normal range of motion and neck supple.  Lymphadenopathy:     Cervical: No cervical adenopathy.  Skin:    General: Skin is warm and dry.  Neurological:     Mental Status: She is alert and oriented to person, place, and time.     Cranial Nerves: No cranial nerve deficit.  Psychiatric:        Behavior: Behavior normal.        Thought Content: Thought content normal.        Judgment: Judgment normal.        Assessment/Plan: 1. Essential hypertension Well controlled, will continue to monitor to ensure no lows.  2. Pancreatic insufficiency Will follow up with GI due to inability to take creon regularly leading to more flares and nausea/wt loss  3. Nausea Likely secondary to pancreas, long hx of pancreatitis and has flares of nausea, will follow up with GI. If new or worsening symptoms go to ED.  4. Dizziness Likely secondary to inability to eat much/low sugar and will contact GI. Will try to eat small meals.    General Counseling: jeris paladino understanding of the findings of todays visit and agrees with plan of treatment. I have discussed any further diagnostic evaluation that may be needed or ordered today. We also reviewed her medications today. she has been encouraged to call the office with any questions or concerns that should arise related to todays visit.    No orders of the defined types were placed in this encounter.   No orders of the defined types were placed in this encounter.   This patient was seen by Lynn Ito, PA-C in collaboration with Dr. Beverely Risen as a part of collaborative care agreement.   Total time spent:30 Minutes Time spent includes review of chart, medications, test results, and follow up plan with the patient.      Dr Lyndon Code Internal medicine

## 2023-04-02 DIAGNOSIS — F4001 Agoraphobia with panic disorder: Secondary | ICD-10-CM | POA: Diagnosis not present

## 2023-04-02 DIAGNOSIS — F331 Major depressive disorder, recurrent, moderate: Secondary | ICD-10-CM | POA: Diagnosis not present

## 2023-04-02 DIAGNOSIS — F4011 Social phobia, generalized: Secondary | ICD-10-CM | POA: Diagnosis not present

## 2023-04-02 DIAGNOSIS — F4312 Post-traumatic stress disorder, chronic: Secondary | ICD-10-CM | POA: Diagnosis not present

## 2023-04-14 DIAGNOSIS — F4001 Agoraphobia with panic disorder: Secondary | ICD-10-CM | POA: Diagnosis not present

## 2023-04-14 DIAGNOSIS — F4011 Social phobia, generalized: Secondary | ICD-10-CM | POA: Diagnosis not present

## 2023-04-14 DIAGNOSIS — F4312 Post-traumatic stress disorder, chronic: Secondary | ICD-10-CM | POA: Diagnosis not present

## 2023-04-14 DIAGNOSIS — F331 Major depressive disorder, recurrent, moderate: Secondary | ICD-10-CM | POA: Diagnosis not present

## 2023-04-15 DIAGNOSIS — F4001 Agoraphobia with panic disorder: Secondary | ICD-10-CM | POA: Diagnosis not present

## 2023-04-15 DIAGNOSIS — F4312 Post-traumatic stress disorder, chronic: Secondary | ICD-10-CM | POA: Diagnosis not present

## 2023-04-15 DIAGNOSIS — F331 Major depressive disorder, recurrent, moderate: Secondary | ICD-10-CM | POA: Diagnosis not present

## 2023-04-15 DIAGNOSIS — F4011 Social phobia, generalized: Secondary | ICD-10-CM | POA: Diagnosis not present

## 2023-05-07 ENCOUNTER — Telehealth (INDEPENDENT_AMBULATORY_CARE_PROVIDER_SITE_OTHER): Payer: BC Managed Care – PPO | Admitting: Nurse Practitioner

## 2023-05-07 ENCOUNTER — Encounter: Payer: Self-pay | Admitting: Nurse Practitioner

## 2023-05-07 ENCOUNTER — Telehealth: Payer: Self-pay

## 2023-05-07 VITALS — BP 130/83 | Temp 99.5°F | Resp 16 | Ht 72.0 in | Wt 171.0 lb

## 2023-05-07 DIAGNOSIS — R062 Wheezing: Secondary | ICD-10-CM

## 2023-05-07 DIAGNOSIS — R051 Acute cough: Secondary | ICD-10-CM

## 2023-05-07 DIAGNOSIS — J0110 Acute frontal sinusitis, unspecified: Secondary | ICD-10-CM | POA: Diagnosis not present

## 2023-05-07 DIAGNOSIS — B379 Candidiasis, unspecified: Secondary | ICD-10-CM | POA: Diagnosis not present

## 2023-05-07 MED ORDER — ALBUTEROL SULFATE HFA 108 (90 BASE) MCG/ACT IN AERS
2.0000 | INHALATION_SPRAY | Freq: Four times a day (QID) | RESPIRATORY_TRACT | 0 refills | Status: DC | PRN
Start: 2023-05-07 — End: 2024-06-27

## 2023-05-07 MED ORDER — PROMETHAZINE-DM 6.25-15 MG/5ML PO SYRP: 5.0000 mL | ORAL_SOLUTION | Freq: Four times a day (QID) | ORAL | 0 refills | Status: DC | PRN

## 2023-05-07 MED ORDER — FLUCONAZOLE 150 MG PO TABS
150.0000 mg | ORAL_TABLET | Freq: Once | ORAL | 0 refills | Status: AC
Start: 2023-05-07 — End: 2023-05-07

## 2023-05-07 MED ORDER — HYDROCOD POLI-CHLORPHE POLI ER 10-8 MG/5ML PO SUER
5.0000 mL | Freq: Two times a day (BID) | ORAL | 0 refills | Status: DC | PRN
Start: 2023-05-07 — End: 2023-05-07

## 2023-05-07 MED ORDER — AMOXICILLIN-POT CLAVULANATE 875-125 MG PO TABS
1.0000 | ORAL_TABLET | Freq: Two times a day (BID) | ORAL | 0 refills | Status: AC
Start: 2023-05-07 — End: 2023-05-17

## 2023-05-07 MED ORDER — PREDNISONE 10 MG (21) PO TBPK
ORAL_TABLET | ORAL | 0 refills | Status: DC
Start: 2023-05-07 — End: 2024-02-01

## 2023-05-07 NOTE — Addendum Note (Signed)
Addended by: Sallyanne Kuster on: 05/07/2023 09:35 AM   Modules accepted: Orders

## 2023-05-07 NOTE — Telephone Encounter (Signed)
[facility] phar called they are back order for Tussionex  cough syrup and alyssa sent promethazine-dextromethorphan

## 2023-05-07 NOTE — Progress Notes (Addendum)
[facility] [address] [place], [place] [address]  Internal MEDICINE  Telephone Visit  Patient Name: Stacy Pineda  [id number]  [id number]  Date of Service: 05/07/2023  I connected with the patient at 0815 by telephone and verified the patients identity using two identifiers.   I discussed the limitations, risks, security and privacy concerns of performing an evaluation and management service by telephone and the availability of in person appointments. I also discussed with the patient that there may be a patient responsible charge related to the service.  The patient expressed understanding and agrees to proceed.    Chief Complaint  Patient presents with   Telephone Screen    Cough, head congestion, severe headache, low energy, running low grade fever   Telephone Assessment    HPI Stacy Pineda presents for a telehealth virtual visit for Started 2 weeks ago with head congestion Negative for covid Tried OTC meds got a little better but is getting worse Reports headache, sinus pressure and pain, sore throat, cough,     Current Medication: Outpatient Encounter Medications as of 05/07/2023  Medication Sig   albuterol (VENTOLIN HFA) 108 (90 Base) MCG/ACT inhaler Inhale 2 puffs into the lungs every 6 (six) hours as needed for wheezing or shortness of breath.   amLODipine (NORVASC) 10 MG tablet Take 1 tablet (10 mg total) by mouth daily.   amoxicillin-clavulanate (AUGMENTIN) 875-125 MG tablet Take 1 tablet by mouth 2 (two) times daily for 10 days. Take with food   buPROPion (WELLBUTRIN XL) 300 MG 24 hr tablet Take 300 mg by mouth every morning.   clonazePAM (KLONOPIN) 1 MG tablet Take 1 mg by mouth 2 (two) times daily as needed.   cyanocobalamin (VITAMIN B12) 1000 MCG/ML injection Inject every week for 4 weeks and then every other week for 2 months.   diazepam (VALIUM) 5 MG tablet Take 5 mg by mouth 3 (three) times daily as needed.   doxepin (SINEQUAN) 10 MG capsule Take 10  mg by mouth at bedtime.   DULoxetine (CYMBALTA) 30 MG capsule Take 60 mg by mouth daily.   DULoxetine (CYMBALTA) 60 MG capsule    ergocalciferol (DRISDOL) 1.25 MG (50000 UT) capsule Take one cap q week   fluconazole (DIFLUCAN) 150 MG tablet Take 1 tablet (150 mg total) by mouth once for 1 dose. May take an additional dose after 3 days if still symptomatic.   folic acid (FOLVITE) 1 MG tablet Take 1 tablet (1 mg total) by mouth daily.   hydrochlorothiazide (HYDRODIURIL) 12.5 MG tablet Take 1 tablet (12.5 mg total) by mouth daily.   hydrocortisone (ANUSOL-HC) 2.5 % rectal cream Place 1 Application rectally 2 (two) times daily.   lamoTRIgine (LAMICTAL) 100 MG tablet Take 100 mg by mouth at bedtime.   lipase/protease/amylase (CREON) 36000 UNITS CPEP capsule Take 3 capsules with the first bite of each meal and 2 capsule with the first bite of each snack   Multiple Vitamins-Minerals (MULTIVITAMIN ADULT PO) Take 1 tablet by mouth.   omeprazole (PRILOSEC) 40 MG capsule Take 1 capsule (40 mg total) by mouth daily before breakfast.   oxyCODONE-acetaminophen (PERCOCET) 10-325 MG tablet Take half to one tab po qd prn for acute and severe pain   prazosin (MINIPRESS) 1 MG capsule Take 1 mg by mouth at bedtime.   predniSONE (STERAPRED UNI-PAK 21 TAB) 10 MG (21) TBPK tablet Use as directed for 6 days   promethazine-dextromethorphan (PROMETHAZINE-DM) 6.25-15 MG/5ML syrup Take 5 mLs by mouth 4 (four)  times daily as needed for cough.   Ubrogepant (UBRELVY) 100 MG TABS Take 1 tablet by mouth as needed for acute migraine. May take second tablet 2 hours later if needed.   Vilazodone HCl (VIIBRYD) 40 MG TABS Take 40 mg by mouth daily.   [DISCONTINUED] chlorpheniramine-HYDROcodone (TUSSIONEX) 10-8 MG/5ML Take 5 mLs by mouth every 12 (twelve) hours as needed for cough.   [DISCONTINUED] promethazine (PHENERGAN) 6.25 MG/5ML syrup TAKE  10 ML BY MOUTH EVERY 8 HOURS AS NEEDED FOR NAUSEA AND VOMITING OR  REFRACTORY   NAUSEA/VOMITING   dicyclomine (BENTYL) 20 MG tablet Take 1 tablet (20 mg total) by mouth every 8 (eight) hours. As needed   gabapentin (NEURONTIN) 100 MG capsule Take 2 capsules (200 mg total) by mouth 2 (two) times daily. (Patient not taking: Reported on 11/11/2022)   medroxyPROGESTERone (DEPO-PROVERA) 150 MG/ML injection Inject 1 mL (150 mg total) into the muscle once for 1 dose.   No facility-administered encounter medications on file as of 05/07/2023.    Surgical History: Past Surgical History:  Procedure Laterality Date   ABDOMINAL SURGERY     BREAST BIOPSY Right 04/27/2020   stereo bx, coil clip, path pending    CHOLECYSTECTOMY     COLONOSCOPY WITH PROPOFOL N/A 09/08/2018   Procedure: COLONOSCOPY WITH PROPOFOL;  Surgeon: Pasty Spillers, MD;  Location: [facility] ENDOSCOPY;  Service: Endoscopy;  Laterality: N/A;   COLONOSCOPY WITH PROPOFOL N/A 08/29/2021   Procedure: COLONOSCOPY WITH PROPOFOL;  Surgeon: Toney Reil, MD;  Location: [facility];  Service: Endoscopy;  Laterality: N/A;   DECUBITUS ULCER EXCISION     DILATION AND CURETTAGE OF UTERUS     ESOPHAGOGASTRODUODENOSCOPY (EGD) WITH PROPOFOL N/A 09/08/2018   Procedure: ESOPHAGOGASTRODUODENOSCOPY (EGD) WITH PROPOFOL;  Surgeon: Pasty Spillers, MD;  Location: [facility] ENDOSCOPY;  Service: Endoscopy;  Laterality: N/A;   ESOPHAGOGASTRODUODENOSCOPY (EGD) WITH PROPOFOL N/A 08/29/2021   Procedure: ESOPHAGOGASTRODUODENOSCOPY (EGD) WITH PROPOFOL;  Surgeon: Toney Reil, MD;  Location: [facility];  Service: Endoscopy;  Laterality: N/A;   ESOPHAGOGASTRODUODENOSCOPY (EGD) WITH PROPOFOL N/A 04/10/2022   Procedure: ESOPHAGOGASTRODUODENOSCOPY (EGD) WITH PROPOFOL;  Surgeon: Toney Reil, MD;  Location: [facility];  Service: Gastroenterology;  Laterality: N/A;   GASTRIC BYPASS     GASTRIC BYPASS     REVISION GASTRIC RESTRICTIVE PROCEDURE FOR MORBID OBESITY      Medical History: Past Medical History:   Diagnosis Date   Anxiety    Asthma    Bilateral ovarian cysts    Chronic headaches    migraines about 1x/month   Depression    DVT (deep venous thrombosis) (HCC) 07/2014   right upper extremity   Hypothyroidism    Pancreatitis    Pancreatitis    PTSD (post-traumatic stress disorder)    Wears contact lenses     Family History: Family History  Problem Relation Age of Onset   Graves' disease Mother    Hypertension Mother    Hyperlipidemia Mother    Anxiety disorder Father    Prostate cancer Father    Depression Brother    Anxiety disorder Brother    Anxiety disorder Maternal Aunt    Depression Maternal Aunt    Anxiety disorder Paternal Aunt    Depression Paternal Aunt    Breast cancer Paternal Aunt    Anxiety disorder Maternal Uncle    Depression Maternal Uncle    Anxiety disorder Paternal Uncle    Depression Paternal Uncle    Anxiety disorder Maternal Grandfather  Depression Maternal Grandfather    Diabetes Maternal Grandfather    Anxiety disorder Maternal Grandmother    Depression Maternal Grandmother    Breast cancer Maternal Grandmother    Ovarian cancer Maternal Grandmother    Diabetes Maternal Grandmother    Colon cancer Neg Hx     Social History   Socioeconomic History   Marital status: Single    Spouse name: Not on file   Number of children: Not on file   Years of education: Not on file   Highest education level: Not on file  Occupational History   Not on file  Tobacco Use   Smoking status: Some Days    Current packs/day: 0.25    Average packs/day: 0.3 packs/day for 18.1 years (4.5 ttl pk-yrs)    Types: Cigarettes    Start date: 03/26/2005   Smokeless tobacco: Never   Tobacco comments:    2 cigarettes daily  Vaping Use   Vaping status: Never Used  Substance and Sexual Activity   Alcohol use: No    Alcohol/week: 0.0 standard drinks of alcohol   Drug use: Yes    Frequency: 3.0 times per week    Types: Marijuana    Comment: last used 2  weeks ago   Sexual activity: Not Currently    Birth control/protection: Injection  Other Topics Concern   Not on file  Social History Narrative   Not on file   Social Determinants of Health   Financial Resource Strain: High Risk (05/06/2019)   Received from [facility], [facility]   Overall Financial Resource Strain (CARDIA)    Difficulty of Paying Living Expenses: Hard  Food Insecurity: Food Insecurity Present (05/06/2019)   Received from [facility], [facility]   Hunger Vital Sign    Worried About Running Out of Food in the Last Year: Never true    Ran Out of Food in the Last Year: Sometimes true  Transportation Needs: No Transportation Needs (05/06/2019)   Received from [facility], [facility]   [facility] - Transportation    Lack of Transportation (Medical): No    Lack of Transportation (Non-Medical): No  Physical Activity: Not on file  Stress: Not on file  Social Connections: Not on file  Intimate Partner Violence: Not on file      Review of Systems  Constitutional:  Positive for appetite change, chills, fatigue and fever.  HENT:  Positive for congestion, postnasal drip, rhinorrhea, sinus pressure, sinus pain, sore throat and voice change.   Respiratory:  Positive for cough, chest tightness, shortness of breath and wheezing.   Cardiovascular:  Negative for chest pain and palpitations.  Gastrointestinal: Negative.   Neurological:  Positive for headaches.    Vital Signs: BP 130/83   Temp 99.5 F (37.5 C)   Resp 16   Ht 6' (1.829 m)   Wt 171 lb (77.6 kg)   BMI 23.19 kg/m    Observation/Objective: She is alert and oriented. Voice is hoarse over audio. No acute distress noted.     Assessment/Plan: 1. Acute non-recurrent frontal sinusitis Empiric antibiotic treatment prescribed, take until gone.  - amoxicillin-clavulanate (AUGMENTIN) 875-125 MG tablet; Take 1 tablet by mouth 2 (two) times daily for 10 days. Take with food  Dispense: 20  tablet; Refill: 0  2. Acute cough Cough syrup prescribed for symptom relief.  - chlorpheniramine-HYDROcodone (TUSSIONEX) 10-8 MG/5ML; Take 5 mLs by mouth every 12 (twelve) hours as needed for cough.  Dispense: 140 mL;  Refill: 0  3. Wheezing Prednisone taper and albuterol inhaler prescribed.  - predniSONE (STERAPRED UNI-PAK 21 TAB) 10 MG (21) TBPK tablet; Use as directed for 6 days  Dispense: 21 tablet; Refill: 0 - albuterol (VENTOLIN HFA) 108 (90 Base) MCG/ACT inhaler; Inhale 2 puffs into the lungs every 6 (six) hours as needed for wheezing or shortness of breath.  Dispense: 8 g; Refill: 0  4. Antibiotic-induced yeast infection Fluconazole prescribed due to prone to yeast infection with antibiotic use.  - fluconazole (DIFLUCAN) 150 MG tablet; Take 1 tablet (150 mg total) by mouth once for 1 dose. May take an additional dose after 3 days if still symptomatic.  Dispense: 3 tablet; Refill: 0   General Counseling: Demara verbalizes understanding of the findings of today's phone visit and agrees with plan of treatment. I have discussed any further diagnostic evaluation that may be needed or ordered today. We also reviewed her medications today. she has been encouraged to call the office with any questions or concerns that should arise related to todays visit.  Return if symptoms worsen or fail to improve.   No orders of the defined types were placed in this encounter.   Meds ordered this encounter  Medications   amoxicillin-clavulanate (AUGMENTIN) 875-125 MG tablet    Sig: Take 1 tablet by mouth 2 (two) times daily for 10 days. Take with food    Dispense:  20 tablet    Refill:  0   DISCONTD: chlorpheniramine-HYDROcodone (TUSSIONEX) 10-8 MG/5ML    Sig: Take 5 mLs by mouth every 12 (twelve) hours as needed for cough.    Dispense:  140 mL    Refill:  0   predniSONE (STERAPRED UNI-PAK 21 TAB) 10 MG (21) TBPK tablet    Sig: Use as directed for 6 days    Dispense:  21 tablet    Refill:  0    albuterol (VENTOLIN HFA) 108 (90 Base) MCG/ACT inhaler    Sig: Inhale 2 puffs into the lungs every 6 (six) hours as needed for wheezing or shortness of breath.    Dispense:  8 g    Refill:  0   fluconazole (DIFLUCAN) 150 MG tablet    Sig: Take 1 tablet (150 mg total) by mouth once for 1 dose. May take an additional dose after 3 days if still symptomatic.    Dispense:  3 tablet    Refill:  0   promethazine-dextromethorphan (PROMETHAZINE-DM) 6.25-15 MG/5ML syrup    Sig: Take 5 mLs by mouth 4 (four) times daily as needed for cough.    Dispense:  180 mL    Refill:  0    Time spent:10 Minutes Time spent with patient included reviewing progress notes, labs, imaging studies, and discussing plan for follow up.  [place] Controlled Substance Database was reviewed by me for overdose risk score (ORS) if appropriate.  This patient was seen by Sallyanne Kuster, FNP-C in collaboration with Dr. Beverely Risen as a part of collaborative care agreement.  Rachid Parham R. Tedd Sias, MSN, FNP-C Internal medicine

## 2023-05-13 DIAGNOSIS — G4700 Insomnia, unspecified: Secondary | ICD-10-CM | POA: Diagnosis not present

## 2023-05-13 DIAGNOSIS — F331 Major depressive disorder, recurrent, moderate: Secondary | ICD-10-CM | POA: Diagnosis not present

## 2023-05-13 DIAGNOSIS — F4011 Social phobia, generalized: Secondary | ICD-10-CM | POA: Diagnosis not present

## 2023-05-13 DIAGNOSIS — F4312 Post-traumatic stress disorder, chronic: Secondary | ICD-10-CM | POA: Diagnosis not present

## 2023-06-10 DIAGNOSIS — F4312 Post-traumatic stress disorder, chronic: Secondary | ICD-10-CM | POA: Diagnosis not present

## 2023-06-10 DIAGNOSIS — F4011 Social phobia, generalized: Secondary | ICD-10-CM | POA: Diagnosis not present

## 2023-06-10 DIAGNOSIS — F331 Major depressive disorder, recurrent, moderate: Secondary | ICD-10-CM | POA: Diagnosis not present

## 2023-06-10 DIAGNOSIS — G4700 Insomnia, unspecified: Secondary | ICD-10-CM | POA: Diagnosis not present

## 2023-06-15 DIAGNOSIS — F4312 Post-traumatic stress disorder, chronic: Secondary | ICD-10-CM | POA: Diagnosis not present

## 2023-06-15 DIAGNOSIS — F331 Major depressive disorder, recurrent, moderate: Secondary | ICD-10-CM | POA: Diagnosis not present

## 2023-06-15 DIAGNOSIS — G4700 Insomnia, unspecified: Secondary | ICD-10-CM | POA: Diagnosis not present

## 2023-06-15 DIAGNOSIS — F4011 Social phobia, generalized: Secondary | ICD-10-CM | POA: Diagnosis not present

## 2023-06-27 ENCOUNTER — Other Ambulatory Visit: Payer: Self-pay | Admitting: Physician Assistant

## 2023-06-27 DIAGNOSIS — I10 Essential (primary) hypertension: Secondary | ICD-10-CM

## 2023-06-29 ENCOUNTER — Ambulatory Visit: Payer: BC Managed Care – PPO | Admitting: Physician Assistant

## 2023-07-07 DIAGNOSIS — F331 Major depressive disorder, recurrent, moderate: Secondary | ICD-10-CM | POA: Diagnosis not present

## 2023-07-07 DIAGNOSIS — F4312 Post-traumatic stress disorder, chronic: Secondary | ICD-10-CM | POA: Diagnosis not present

## 2023-07-07 DIAGNOSIS — G4700 Insomnia, unspecified: Secondary | ICD-10-CM | POA: Diagnosis not present

## 2023-07-07 DIAGNOSIS — F4011 Social phobia, generalized: Secondary | ICD-10-CM | POA: Diagnosis not present

## 2023-07-15 DIAGNOSIS — F4011 Social phobia, generalized: Secondary | ICD-10-CM | POA: Diagnosis not present

## 2023-07-15 DIAGNOSIS — F4312 Post-traumatic stress disorder, chronic: Secondary | ICD-10-CM | POA: Diagnosis not present

## 2023-07-15 DIAGNOSIS — F331 Major depressive disorder, recurrent, moderate: Secondary | ICD-10-CM | POA: Diagnosis not present

## 2023-07-15 DIAGNOSIS — F4001 Agoraphobia with panic disorder: Secondary | ICD-10-CM | POA: Diagnosis not present

## 2023-07-27 DIAGNOSIS — F331 Major depressive disorder, recurrent, moderate: Secondary | ICD-10-CM | POA: Diagnosis not present

## 2023-07-27 DIAGNOSIS — F4312 Post-traumatic stress disorder, chronic: Secondary | ICD-10-CM | POA: Diagnosis not present

## 2023-07-27 DIAGNOSIS — F4001 Agoraphobia with panic disorder: Secondary | ICD-10-CM | POA: Diagnosis not present

## 2023-07-27 DIAGNOSIS — F4011 Social phobia, generalized: Secondary | ICD-10-CM | POA: Diagnosis not present

## 2023-08-03 DIAGNOSIS — F331 Major depressive disorder, recurrent, moderate: Secondary | ICD-10-CM | POA: Diagnosis not present

## 2023-08-03 DIAGNOSIS — G4700 Insomnia, unspecified: Secondary | ICD-10-CM | POA: Diagnosis not present

## 2023-08-03 DIAGNOSIS — F4001 Agoraphobia with panic disorder: Secondary | ICD-10-CM | POA: Diagnosis not present

## 2023-08-03 DIAGNOSIS — F4011 Social phobia, generalized: Secondary | ICD-10-CM | POA: Diagnosis not present

## 2023-08-24 DIAGNOSIS — Z79899 Other long term (current) drug therapy: Secondary | ICD-10-CM | POA: Diagnosis not present

## 2023-08-25 ENCOUNTER — Other Ambulatory Visit: Payer: Self-pay | Admitting: Physician Assistant

## 2023-08-25 NOTE — Telephone Encounter (Signed)
 Please review and send

## 2023-08-26 DIAGNOSIS — F4011 Social phobia, generalized: Secondary | ICD-10-CM | POA: Diagnosis not present

## 2023-08-26 DIAGNOSIS — F331 Major depressive disorder, recurrent, moderate: Secondary | ICD-10-CM | POA: Diagnosis not present

## 2023-08-26 DIAGNOSIS — F4001 Agoraphobia with panic disorder: Secondary | ICD-10-CM | POA: Diagnosis not present

## 2023-08-26 DIAGNOSIS — F4312 Post-traumatic stress disorder, chronic: Secondary | ICD-10-CM | POA: Diagnosis not present

## 2023-09-01 DIAGNOSIS — F4312 Post-traumatic stress disorder, chronic: Secondary | ICD-10-CM | POA: Diagnosis not present

## 2023-09-01 DIAGNOSIS — F4011 Social phobia, generalized: Secondary | ICD-10-CM | POA: Diagnosis not present

## 2023-09-01 DIAGNOSIS — F4001 Agoraphobia with panic disorder: Secondary | ICD-10-CM | POA: Diagnosis not present

## 2023-09-01 DIAGNOSIS — F331 Major depressive disorder, recurrent, moderate: Secondary | ICD-10-CM | POA: Diagnosis not present

## 2023-10-05 DIAGNOSIS — F4312 Post-traumatic stress disorder, chronic: Secondary | ICD-10-CM | POA: Diagnosis not present

## 2023-10-05 DIAGNOSIS — F331 Major depressive disorder, recurrent, moderate: Secondary | ICD-10-CM | POA: Diagnosis not present

## 2023-10-05 DIAGNOSIS — F4001 Agoraphobia with panic disorder: Secondary | ICD-10-CM | POA: Diagnosis not present

## 2023-10-05 DIAGNOSIS — F4011 Social phobia, generalized: Secondary | ICD-10-CM | POA: Diagnosis not present

## 2023-10-08 DIAGNOSIS — F4011 Social phobia, generalized: Secondary | ICD-10-CM | POA: Diagnosis not present

## 2023-10-08 DIAGNOSIS — F4312 Post-traumatic stress disorder, chronic: Secondary | ICD-10-CM | POA: Diagnosis not present

## 2023-10-08 DIAGNOSIS — F4001 Agoraphobia with panic disorder: Secondary | ICD-10-CM | POA: Diagnosis not present

## 2023-10-08 DIAGNOSIS — F331 Major depressive disorder, recurrent, moderate: Secondary | ICD-10-CM | POA: Diagnosis not present

## 2023-10-19 DIAGNOSIS — F4312 Post-traumatic stress disorder, chronic: Secondary | ICD-10-CM | POA: Diagnosis not present

## 2023-10-19 DIAGNOSIS — F4011 Social phobia, generalized: Secondary | ICD-10-CM | POA: Diagnosis not present

## 2023-10-19 DIAGNOSIS — F331 Major depressive disorder, recurrent, moderate: Secondary | ICD-10-CM | POA: Diagnosis not present

## 2023-10-19 DIAGNOSIS — F4001 Agoraphobia with panic disorder: Secondary | ICD-10-CM | POA: Diagnosis not present

## 2023-10-30 ENCOUNTER — Encounter: Payer: Self-pay | Admitting: Oncology

## 2023-10-30 ENCOUNTER — Telehealth: Payer: Self-pay | Admitting: Physician Assistant

## 2023-10-30 NOTE — Telephone Encounter (Signed)
 Left vm and sent mychart message to confirm 11/06/23 appointment-Toni

## 2023-11-06 ENCOUNTER — Ambulatory Visit: Payer: BC Managed Care – PPO | Admitting: Physician Assistant

## 2023-11-06 DIAGNOSIS — F4011 Social phobia, generalized: Secondary | ICD-10-CM | POA: Diagnosis not present

## 2023-11-06 DIAGNOSIS — F4312 Post-traumatic stress disorder, chronic: Secondary | ICD-10-CM | POA: Diagnosis not present

## 2023-11-06 DIAGNOSIS — F4001 Agoraphobia with panic disorder: Secondary | ICD-10-CM | POA: Diagnosis not present

## 2023-11-06 DIAGNOSIS — F331 Major depressive disorder, recurrent, moderate: Secondary | ICD-10-CM | POA: Diagnosis not present

## 2023-11-09 DIAGNOSIS — H6691 Otitis media, unspecified, right ear: Secondary | ICD-10-CM | POA: Diagnosis not present

## 2023-11-09 DIAGNOSIS — J029 Acute pharyngitis, unspecified: Secondary | ICD-10-CM | POA: Diagnosis not present

## 2023-11-09 DIAGNOSIS — R051 Acute cough: Secondary | ICD-10-CM | POA: Diagnosis not present

## 2023-11-09 DIAGNOSIS — J069 Acute upper respiratory infection, unspecified: Secondary | ICD-10-CM | POA: Diagnosis not present

## 2023-12-11 DIAGNOSIS — F4011 Social phobia, generalized: Secondary | ICD-10-CM | POA: Diagnosis not present

## 2023-12-11 DIAGNOSIS — F331 Major depressive disorder, recurrent, moderate: Secondary | ICD-10-CM | POA: Diagnosis not present

## 2023-12-11 DIAGNOSIS — F4312 Post-traumatic stress disorder, chronic: Secondary | ICD-10-CM | POA: Diagnosis not present

## 2023-12-11 DIAGNOSIS — F4001 Agoraphobia with panic disorder: Secondary | ICD-10-CM | POA: Diagnosis not present

## 2023-12-24 ENCOUNTER — Other Ambulatory Visit: Payer: Self-pay | Admitting: Physician Assistant

## 2023-12-24 DIAGNOSIS — I10 Essential (primary) hypertension: Secondary | ICD-10-CM

## 2024-01-05 ENCOUNTER — Other Ambulatory Visit: Payer: Self-pay | Admitting: Certified Nurse Midwife

## 2024-01-14 DIAGNOSIS — F4001 Agoraphobia with panic disorder: Secondary | ICD-10-CM | POA: Diagnosis not present

## 2024-01-14 DIAGNOSIS — F4011 Social phobia, generalized: Secondary | ICD-10-CM | POA: Diagnosis not present

## 2024-01-14 DIAGNOSIS — F4312 Post-traumatic stress disorder, chronic: Secondary | ICD-10-CM | POA: Diagnosis not present

## 2024-01-14 DIAGNOSIS — F331 Major depressive disorder, recurrent, moderate: Secondary | ICD-10-CM | POA: Diagnosis not present

## 2024-01-20 ENCOUNTER — Encounter: Payer: Self-pay | Admitting: Oncology

## 2024-01-26 ENCOUNTER — Telehealth: Payer: Self-pay | Admitting: Physician Assistant

## 2024-01-26 ENCOUNTER — Telehealth: Payer: Self-pay

## 2024-01-26 ENCOUNTER — Other Ambulatory Visit: Payer: Self-pay | Admitting: Physician Assistant

## 2024-01-26 DIAGNOSIS — I10 Essential (primary) hypertension: Secondary | ICD-10-CM

## 2024-01-26 NOTE — Telephone Encounter (Signed)
 Mailed appointment letter to patient. Scanned-Stacy Pineda

## 2024-01-26 NOTE — Telephone Encounter (Signed)
 Try to call pt no voicemail setup

## 2024-01-27 ENCOUNTER — Telehealth: Payer: Self-pay | Admitting: Physician Assistant

## 2024-01-27 ENCOUNTER — Other Ambulatory Visit: Payer: Self-pay | Admitting: Physician Assistant

## 2024-01-27 DIAGNOSIS — E559 Vitamin D deficiency, unspecified: Secondary | ICD-10-CM

## 2024-01-27 DIAGNOSIS — R5383 Other fatigue: Secondary | ICD-10-CM

## 2024-01-27 DIAGNOSIS — E782 Mixed hyperlipidemia: Secondary | ICD-10-CM

## 2024-01-27 DIAGNOSIS — E611 Iron deficiency: Secondary | ICD-10-CM

## 2024-01-27 DIAGNOSIS — Z0001 Encounter for general adult medical examination with abnormal findings: Secondary | ICD-10-CM

## 2024-01-27 DIAGNOSIS — E538 Deficiency of other specified B group vitamins: Secondary | ICD-10-CM

## 2024-01-27 DIAGNOSIS — R946 Abnormal results of thyroid function studies: Secondary | ICD-10-CM

## 2024-01-27 NOTE — Telephone Encounter (Signed)
Sent mychart message that labs ordered

## 2024-01-27 NOTE — Telephone Encounter (Signed)
Labs ordered, needs to be fasting.

## 2024-01-27 NOTE — Telephone Encounter (Signed)
 Lvm notifying patient that lab orders have been placed-Toni

## 2024-01-28 ENCOUNTER — Encounter

## 2024-01-29 DIAGNOSIS — E782 Mixed hyperlipidemia: Secondary | ICD-10-CM | POA: Diagnosis not present

## 2024-01-29 DIAGNOSIS — E538 Deficiency of other specified B group vitamins: Secondary | ICD-10-CM | POA: Diagnosis not present

## 2024-01-29 DIAGNOSIS — Z0001 Encounter for general adult medical examination with abnormal findings: Secondary | ICD-10-CM | POA: Diagnosis not present

## 2024-01-29 DIAGNOSIS — R946 Abnormal results of thyroid function studies: Secondary | ICD-10-CM | POA: Diagnosis not present

## 2024-01-29 DIAGNOSIS — E559 Vitamin D deficiency, unspecified: Secondary | ICD-10-CM | POA: Diagnosis not present

## 2024-01-29 DIAGNOSIS — E611 Iron deficiency: Secondary | ICD-10-CM | POA: Diagnosis not present

## 2024-01-30 LAB — CBC WITH DIFFERENTIAL/PLATELET
Basophils Absolute: 0.1 10*3/uL (ref 0.0–0.2)
Basos: 1 %
EOS (ABSOLUTE): 0.1 10*3/uL (ref 0.0–0.4)
Eos: 2 %
Hematocrit: 38.4 % (ref 34.0–46.6)
Hemoglobin: 11.6 g/dL (ref 11.1–15.9)
Immature Grans (Abs): 0 10*3/uL (ref 0.0–0.1)
Immature Granulocytes: 0 %
Lymphocytes Absolute: 2.4 10*3/uL (ref 0.7–3.1)
Lymphs: 34 %
MCH: 24.1 pg — ABNORMAL LOW (ref 26.6–33.0)
MCHC: 30.2 g/dL — ABNORMAL LOW (ref 31.5–35.7)
MCV: 80 fL (ref 79–97)
Monocytes Absolute: 0.3 10*3/uL (ref 0.1–0.9)
Monocytes: 4 %
NRBC: 1 % — ABNORMAL HIGH (ref 0–0)
Neutrophils Absolute: 4 10*3/uL (ref 1.4–7.0)
Neutrophils: 59 %
Platelets: 344 10*3/uL (ref 150–450)
RBC: 4.82 x10E6/uL (ref 3.77–5.28)
RDW: 19.5 % — ABNORMAL HIGH (ref 11.7–15.4)
WBC: 6.9 10*3/uL (ref 3.4–10.8)

## 2024-01-30 LAB — COMPREHENSIVE METABOLIC PANEL WITH GFR
ALT: 19 IU/L (ref 0–32)
AST: 23 IU/L (ref 0–40)
Albumin: 4.6 g/dL (ref 3.9–4.9)
Alkaline Phosphatase: 85 IU/L (ref 44–121)
BUN/Creatinine Ratio: 16 (ref 9–23)
BUN: 12 mg/dL (ref 6–24)
Bilirubin Total: 0.8 mg/dL (ref 0.0–1.2)
CO2: 19 mmol/L — ABNORMAL LOW (ref 20–29)
Calcium: 9.6 mg/dL (ref 8.7–10.2)
Chloride: 105 mmol/L (ref 96–106)
Creatinine, Ser: 0.74 mg/dL (ref 0.57–1.00)
Globulin, Total: 2.4 g/dL (ref 1.5–4.5)
Glucose: 73 mg/dL (ref 70–99)
Potassium: 3.4 mmol/L — ABNORMAL LOW (ref 3.5–5.2)
Sodium: 142 mmol/L (ref 134–144)
Total Protein: 7 g/dL (ref 6.0–8.5)
eGFR: 102 mL/min/{1.73_m2} (ref >59)

## 2024-01-30 LAB — LIPID PANEL WITH LDL/HDL RATIO
Cholesterol, Total: 149 mg/dL (ref 100–199)
HDL: 78 mg/dL (ref >39)
LDL Chol Calc (NIH): 57 mg/dL (ref 0–99)
LDL/HDL Ratio: 0.7 ratio (ref 0.0–3.2)
Triglycerides: 70 mg/dL (ref 0–149)
VLDL Cholesterol Cal: 14 mg/dL (ref 5–40)

## 2024-01-30 LAB — IRON,TIBC AND FERRITIN PANEL
Ferritin: 61 ng/mL (ref 15–150)
Iron Saturation: 29 % (ref 15–55)
Iron: 110 ug/dL (ref 27–159)
Total Iron Binding Capacity: 384 ug/dL (ref 250–450)
UIBC: 274 ug/dL (ref 131–425)

## 2024-01-30 LAB — B12 AND FOLATE PANEL
Folate: 2.9 ng/mL — ABNORMAL LOW (ref >3.0)
Vitamin B-12: 256 pg/mL (ref 232–1245)

## 2024-01-30 LAB — TSH+FREE T4
Free T4: 0.9 ng/dL (ref 0.82–1.77)
TSH: 0.492 u[IU]/mL (ref 0.450–4.500)

## 2024-01-30 LAB — VITAMIN D 25 HYDROXY (VIT D DEFICIENCY, FRACTURES): Vit D, 25-Hydroxy: 10.1 ng/mL — ABNORMAL LOW (ref 30.0–100.0)

## 2024-02-01 ENCOUNTER — Encounter: Payer: Self-pay | Admitting: Physician Assistant

## 2024-02-01 ENCOUNTER — Ambulatory Visit (INDEPENDENT_AMBULATORY_CARE_PROVIDER_SITE_OTHER): Admitting: Physician Assistant

## 2024-02-01 VITALS — BP 125/82 | HR 84 | Temp 98.3°F | Resp 16 | Ht 72.0 in | Wt 180.4 lb

## 2024-02-01 DIAGNOSIS — E876 Hypokalemia: Secondary | ICD-10-CM

## 2024-02-01 DIAGNOSIS — E538 Deficiency of other specified B group vitamins: Secondary | ICD-10-CM

## 2024-02-01 DIAGNOSIS — Z0000 Encounter for general adult medical examination without abnormal findings: Secondary | ICD-10-CM

## 2024-02-01 DIAGNOSIS — G43909 Migraine, unspecified, not intractable, without status migrainosus: Secondary | ICD-10-CM

## 2024-02-01 DIAGNOSIS — E559 Vitamin D deficiency, unspecified: Secondary | ICD-10-CM | POA: Diagnosis not present

## 2024-02-01 DIAGNOSIS — I10 Essential (primary) hypertension: Secondary | ICD-10-CM

## 2024-02-01 DIAGNOSIS — R928 Other abnormal and inconclusive findings on diagnostic imaging of breast: Secondary | ICD-10-CM

## 2024-02-01 DIAGNOSIS — Z87898 Personal history of other specified conditions: Secondary | ICD-10-CM

## 2024-02-01 MED ORDER — ONDANSETRON 4 MG PO TBDP: 4.0000 mg | ORAL_TABLET | Freq: Three times a day (TID) | ORAL | 0 refills | Status: AC | PRN

## 2024-02-01 MED ORDER — ERGOCALCIFEROL 1.25 MG (50000 UT) PO CAPS: ORAL_CAPSULE | ORAL | 3 refills | Status: DC

## 2024-02-01 MED ORDER — PROMETHAZINE HCL 6.25 MG/5ML PO SOLN: 12.5000 mg | Freq: Three times a day (TID) | ORAL | 1 refills | Status: AC | PRN

## 2024-02-01 MED ORDER — POTASSIUM CHLORIDE ER 8 MEQ PO TBCR
EXTENDED_RELEASE_TABLET | ORAL | 2 refills | Status: DC
Start: 2024-02-01 — End: 2024-06-27

## 2024-02-01 MED ORDER — FOLIC ACID 1 MG PO TABS: 1.0000 mg | ORAL_TABLET | Freq: Every day | ORAL | 1 refills | Status: DC

## 2024-02-01 NOTE — Progress Notes (Signed)
 [facility] [address] [place], [place] [address]  Internal MEDICINE  Office Visit Note  Patient Name: Stacy Pineda  [id number]  [id number]  Date of Service: 02/02/2024  Chief Complaint  Patient presents with   Medicare Wellness   Depression   Medication Refill   Smoking Cessation Screening    Wants to stop smoking   Referral    Diagnostic Mammogram needed   Migraine   Sinus Problem    Has dried mucus in nose making it hard to breathe   Leg Pain    Leg cramps at night   Fatigue   Dysphagia    Having issues with swallowing pills   Panic Attack    Having issues with panic attacks, not wanting any Benzos    HPI Trenee presents for an annual well visit  -unfortunately has not been in office in 10 months Well-appearing 43 y.o. female Routine CRC screening:UTD due in 2028 Routine mammogram: overdue and states she tried to call to schedule and was told she needs to do a diagnostic mammogram per last abnormal findings in 2021 Pap smear: UTD Labs: iron  in normal range now, potassium low and will supplement--likely cause of leg cramping and will recheck, vit D low--drisdol , B12 low normal and will restart injections, folate low and will restart supplement Other concerns: -psych every 4 weeks, some panic attacks, but doesn't want to take benzos. Will discuss with psych further. Could consider propranolol  to help with some anxiety -unable to get ubrelvy  from pharmacy, will try for nurtec. Gets nausea, light and sound sensitivity with her migraines. Ongoing for last 15 years, but seem to be worse. Given ubrelvy , nurtec, and zavzpret samples to try. Discussed how to take these and not to combine any within 24 hours. Discussed nurtec can be used as acute or preventive option and will see how she does with samples first before sending. -can't take topamax  due to brain fog. Has taken effexor  before for MDD but not migraines, but does not want to interfere with her mental health  meds right now.  -has not seen neurology in 20 years and would benefit from evaluation -imitrex didn't help in past -continues to have chronic GI issues with pancreatic insufficiency, usually does well with phenergan  liquid, however if really nauseous she is unable to swallow and would like to try dissolving zofran . Will not combine. -BP stable     02/01/2024    2:28 PM 11/03/2022   11:12 AM 10/31/2021   10:15 AM  MMSE - Mini Mental State Exam  Orientation to time 5 5 5   Orientation to Place 5 5 5   Registration 3 3 3   Attention/ Calculation 5 5 5   Recall 3 3 3   Language- name 2 objects 2 2 2   Language- repeat 1 1 1   Language- follow 3 step command 3 3 3   Language- read & follow direction 1 1 1   Write a sentence 1 1 1   Copy design 1 1 1   Total score 30 30 30     Functional Status Survey: Is the patient deaf or have difficulty hearing?: No Does the patient have difficulty seeing, even when wearing glasses/contacts?: No Does the patient have difficulty concentrating, remembering, or making decisions?: No Does the patient have difficulty walking or climbing stairs?: No Does the patient have difficulty dressing or bathing?: No Does the patient have difficulty doing errands alone such as visiting a doctor's office or shopping?: No     04/10/2022    8:50  AM 11/03/2022   11:11 AM 12/01/2022    2:44 PM 02/02/2023    3:42 PM 02/01/2024    2:27 PM  Fall Risk  Falls in the past year?  0 0 1 1  Was there an injury with Fall?  0  0 0  Fall Risk Category Calculator  0  1 2  (RETIRED) Patient Fall Risk Level Low fall risk       Patient at Risk for Falls Due to  No Fall Risks     Fall risk Follow up  Falls evaluation completed        Data saved with a previous flowsheet row definition       02/19/2024    1:27 PM  Depression screen PHQ 2/9  Decreased Interest 3  Down, Depressed, Hopeless 3  PHQ - 2 Score 6  Altered sleeping 3  Tired, decreased energy 3  Change in appetite 3  Feeling  bad or failure about yourself  3  Trouble concentrating 3  Moving slowly or fidgety/restless 0  Suicidal thoughts 2  PHQ-9 Score 23  Difficult doing work/chores Somewhat difficult       12/14/2020    2:22 PM  GAD 7 : Generalized Anxiety Score  Nervous, Anxious, on Edge 3  Control/stop worrying 3  Worry too much - different things 3  Trouble relaxing 3  Restless 1  Easily annoyed or irritable 1  Afraid - awful might happen 3  Total GAD 7 Score 17  Anxiety Difficulty Very difficult      Current Medication: Outpatient Encounter Medications as of 02/01/2024  Medication Sig   albuterol  (VENTOLIN  HFA) 108 (90 Base) MCG/ACT inhaler Inhale 2 puffs into the lungs every 6 (six) hours as needed for wheezing or shortness of breath. (Patient not taking: Reported on 02/19/2024)   Brexpiprazole (REXULTI) 3 MG TABS Take 3 mg by mouth.   dicyclomine  (BENTYL ) 20 MG tablet Take 1 tablet (20 mg total) by mouth every 8 (eight) hours. As needed   hydrochlorothiazide  (HYDRODIURIL ) 12.5 MG tablet Take 1 tablet by mouth once daily   hydrocortisone  (ANUSOL -HC) 2.5 % rectal cream Place 1 Application rectally 2 (two) times daily.   Multiple Vitamins-Minerals (MULTIVITAMIN ADULT PO) Take 1 tablet by mouth.   omeprazole  (PRILOSEC) 40 MG capsule Take 1 capsule (40 mg total) by mouth daily before breakfast.   ondansetron  (ZOFRAN -ODT) 4 MG disintegrating tablet Take 1 tablet (4 mg total) by mouth every 8 (eight) hours as needed for nausea or vomiting.   potassium chloride  (KLOR-CON ) 8 MEQ tablet Take 1 tablet by mouth MWF   Suvorexant (BELSOMRA) 5 MG TABS Take 5 mg by mouth daily.   [DISCONTINUED] amLODipine  (NORVASC ) 10 MG tablet Take 1 tablet by mouth once daily   [DISCONTINUED] buPROPion (WELLBUTRIN XL) 300 MG 24 hr tablet Take 300 mg by mouth every morning.   [DISCONTINUED] clonazePAM  (KLONOPIN ) 1 MG tablet Take 1 mg by mouth 2 (two) times daily as needed.   [DISCONTINUED] cyanocobalamin  (VITAMIN B12) 1000  MCG/ML injection Inject every week for 4 weeks and then every other week for 2 months.   [DISCONTINUED] diazepam (VALIUM) 5 MG tablet Take 5 mg by mouth 3 (three) times daily as needed.   [DISCONTINUED] doxepin (SINEQUAN) 10 MG capsule Take 10 mg by mouth at bedtime.   [DISCONTINUED] DULoxetine (CYMBALTA) 30 MG capsule Take 60 mg by mouth daily.   [DISCONTINUED] DULoxetine (CYMBALTA) 60 MG capsule    [DISCONTINUED] ergocalciferol  (DRISDOL ) 1.25 MG (50000 UT)  capsule Take one cap q week   [DISCONTINUED] folic acid  (FOLVITE ) 1 MG tablet Take 1 tablet (1 mg total) by mouth daily.   [DISCONTINUED] gabapentin  (NEURONTIN ) 100 MG capsule Take 2 capsules (200 mg total) by mouth 2 (two) times daily.   [DISCONTINUED] lamoTRIgine (LAMICTAL) 100 MG tablet Take 100 mg by mouth at bedtime.   [DISCONTINUED] lipase/protease/amylase (CREON ) 36000 UNITS CPEP capsule Take 3 capsules with the first bite of each meal and 2 capsule with the first bite of each snack   [DISCONTINUED] medroxyPROGESTERone  (DEPO-PROVERA ) 150 MG/ML injection Inject 1 mL (150 mg total) into the muscle once for 1 dose.   [DISCONTINUED] oxyCODONE -acetaminophen  (PERCOCET) 10-325 MG tablet Take half to one tab po qd prn for acute and severe pain   [DISCONTINUED] prazosin  (MINIPRESS ) 1 MG capsule Take 5 mg by mouth at bedtime.   [DISCONTINUED] predniSONE  (STERAPRED UNI-PAK 21 TAB) 10 MG (21) TBPK tablet Use as directed for 6 days   [DISCONTINUED] promethazine  (PHENERGAN ) 6.25 MG/5ML solution TAKE  10 ML BY MOUTH EVERY 8 HOURS AS NEEDED FOR NAUSEA AND VOMITING OR  REFRACTORY  NAUSEA/VOMITING   [DISCONTINUED] promethazine -dextromethorphan (PROMETHAZINE -DM) 6.25-15 MG/5ML syrup Take 5 mLs by mouth 4 (four) times daily as needed for cough.   [DISCONTINUED] Ubrogepant  (UBRELVY ) 100 MG TABS Take 1 tablet by mouth as needed for acute migraine. May take second tablet 2 hours later if needed.   [DISCONTINUED] Vilazodone HCl (VIIBRYD) 40 MG TABS Take 40  mg by mouth daily.   cyanocobalamin  (VITAMIN B12) 1000 MCG/ML injection Inject every week for 4 weeks and then every other week for 2 months.   ergocalciferol  (DRISDOL ) 1.25 MG (50000 UT) capsule Take one cap q week   folic acid  (FOLVITE ) 1 MG tablet Take 1 tablet (1 mg total) by mouth daily.   promethazine  (PHENERGAN ) 6.25 MG/5ML solution Take 10 mLs (12.5 mg total) by mouth every 8 (eight) hours as needed for nausea or vomiting. TAKE  10 ML BY MOUTH EVERY 8 HOURS AS NEEDED FOR NAUSEA AND VOMITING OR  REFRACTORY  NAUSEA/VOMITING   No facility-administered encounter medications on file as of 02/01/2024.    Surgical History: Past Surgical History:  Procedure Laterality Date   ABDOMINAL SURGERY     BREAST BIOPSY Right 04/27/2020   stereo bx, coil clip, path pending    CHOLECYSTECTOMY     COLONOSCOPY WITH PROPOFOL  N/A 09/08/2018   Procedure: COLONOSCOPY WITH PROPOFOL ;  Surgeon: Janalyn Keene NOVAK, MD;  Location: [facility];  Service: Endoscopy;  Laterality: N/A;   COLONOSCOPY WITH PROPOFOL  N/A 08/29/2021   Procedure: COLONOSCOPY WITH PROPOFOL ;  Surgeon: Unk Corinn Skiff, MD;  Location: [facility];  Service: Endoscopy;  Laterality: N/A;   DECUBITUS ULCER EXCISION     DILATION AND CURETTAGE OF UTERUS     ESOPHAGOGASTRODUODENOSCOPY (EGD) WITH PROPOFOL  N/A 09/08/2018   Procedure: ESOPHAGOGASTRODUODENOSCOPY (EGD) WITH PROPOFOL ;  Surgeon: Janalyn Keene NOVAK, MD;  Location: [facility];  Service: Endoscopy;  Laterality: N/A;   ESOPHAGOGASTRODUODENOSCOPY (EGD) WITH PROPOFOL  N/A 08/29/2021   Procedure: ESOPHAGOGASTRODUODENOSCOPY (EGD) WITH PROPOFOL ;  Surgeon: Unk Corinn Skiff, MD;  Location: [facility];  Service: Endoscopy;  Laterality: N/A;   ESOPHAGOGASTRODUODENOSCOPY (EGD) WITH PROPOFOL  N/A 04/10/2022   Procedure: ESOPHAGOGASTRODUODENOSCOPY (EGD) WITH PROPOFOL ;  Surgeon: Unk Corinn Skiff, MD;  Location: [facility];  Service: Gastroenterology;  Laterality:  N/A;   GASTRIC BYPASS     GASTRIC BYPASS     REVISION GASTRIC RESTRICTIVE PROCEDURE FOR MORBID OBESITY  Medical History: Past Medical History:  Diagnosis Date   Anxiety    Asthma    Bilateral ovarian cysts    Chronic headaches    migraines about 1x/month   Depression    DVT (deep venous thrombosis) (HCC) 07/2014   right upper extremity   Hypothyroidism    Pancreatitis    Pancreatitis    PTSD (post-traumatic stress disorder)    Wears contact lenses     Family History: Family History  Problem Relation Age of Onset   Graves' disease Mother    Hypertension Mother    Hyperlipidemia Mother    Anxiety disorder Father    Prostate cancer Father    Depression Brother    Anxiety disorder Brother    Anxiety disorder Maternal Aunt    Depression Maternal Aunt    Anxiety disorder Paternal Aunt    Depression Paternal Aunt    Breast cancer Paternal Aunt    Anxiety disorder Maternal Uncle    Depression Maternal Uncle    Anxiety disorder Paternal Uncle    Depression Paternal Uncle    Anxiety disorder Maternal Grandfather    Depression Maternal Grandfather    Diabetes Maternal Grandfather    Anxiety disorder Maternal Grandmother    Depression Maternal Grandmother    Breast cancer Maternal Grandmother    Ovarian cancer Maternal Grandmother    Diabetes Maternal Grandmother    Colon cancer Neg Hx     Social History   Socioeconomic History   Marital status: Single    Spouse name: Not on file   Number of children: Not on file   Years of education: Not on file   Highest education level: Not on file  Occupational History   Not on file  Tobacco Use   Smoking status: Some Days    Current packs/day: 0.25    Average packs/day: 0.3 packs/day for 18.9 years (4.7 ttl pk-yrs)    Types: Cigarettes    Start date: 03/26/2005   Smokeless tobacco: Never   Tobacco comments:    2 cigarettes daily  Vaping Use   Vaping status: Never Used  Substance and Sexual Activity   Alcohol  use: No    Alcohol/week: 0.0 standard drinks of alcohol   Drug use: Yes    Frequency: 3.0 times per week    Types: Marijuana    Comment: last used 2 weeks ago   Sexual activity: Not Currently    Birth control/protection: Injection  Other Topics Concern   Not on file  Social History Narrative   Not on file   Social Drivers of Health   Financial Resource Strain: High Risk (05/06/2019)   Received from [facility]   Overall Financial Resource Strain (CARDIA)    Difficulty of Paying Living Expenses: Hard  Food Insecurity: Food Insecurity Present (05/06/2019)   Received from [facility]   Hunger Vital Sign    Within the past 12 months, you worried that your food would run out before you got the money to buy more.: Never true    Within the past 12 months, the food you bought just didn't last and you didn't have money to get more.: Sometimes true  Transportation Needs: No Transportation Needs (05/06/2019)   Received from [facility]   PRAPARE - Transportation    Lack of Transportation (Medical): No    Lack of Transportation (Non-Medical): No  Physical Activity: Not on file  Stress: Not on file  Social Connections: Not on file  Intimate Partner  Violence: Not on file      Review of Systems  Constitutional:  Negative for chills and fatigue.  HENT:  Positive for postnasal drip. Negative for congestion, rhinorrhea, sneezing and sore throat.   Eyes:  Negative for redness.  Respiratory:  Negative for cough, chest tightness and shortness of breath.   Cardiovascular:  Negative for chest pain and palpitations.  Gastrointestinal:  Positive for abdominal pain and nausea. Negative for constipation and diarrhea.  Genitourinary:  Negative for dysuria and frequency.  Musculoskeletal:  Negative for arthralgias, back pain, joint swelling and neck pain.  Skin:  Negative for rash.  Neurological:  Positive for dizziness and headaches. Negative for tremors and numbness.  Hematological:   Negative for adenopathy. Does not bruise/bleed easily.  Psychiatric/Behavioral:  Negative for behavioral problems (Depression), sleep disturbance and suicidal ideas. The patient is nervous/anxious.     Vital Signs: BP 125/82   Pulse 84   Temp 98.3 F (36.8 C)   Resp 16   Ht 6' (1.829 m)   Wt 180 lb 6.4 oz (81.8 kg)   SpO2 97%   BMI 24.47 kg/m    Physical Exam     Assessment/Plan: 1. Encounter for annual wellness exam in Medicare patient (Primary) Review performed, labs reviewed, mammogram ordered  2. Essential hypertension Stable, continue current medication  3. Hypokalemia Will supplement and update labs - potassium chloride  (KLOR-CON ) 8 MEQ tablet; Take 1 tablet by mouth MWF  Dispense: 30 tablet; Refill: 2 - Potassium  4. Vitamin D  deficiency - ergocalciferol  (DRISDOL ) 1.25 MG (50000 UT) capsule; Take one cap q week  Dispense: 12 capsule; Refill: 3  5. Migraine without status migrainosus, not intractable, unspecified migraine type Given samples of Nurtec, Ubrelvy , Zavzpret to try and discussed not combining any within 24 hours.  Will also refer back to neurology due to worsening of migraines recently - Ambulatory referral to Neurology  6. Other abnormal and inconclusive findings on diagnostic imaging of breast - MM 3D DIAGNOSTIC MAMMOGRAM BILATERAL BREAST; Future  7. Folic acid  deficiency - folic acid  (FOLVITE ) 1 MG tablet; Take 1 tablet (1 mg total) by mouth daily.  Dispense: 90 tablet; Refill: 1  8. B12 deficiency - cyanocobalamin  (VITAMIN B12) 1000 MCG/ML injection; Inject 1ML every week for 4 weeks and then every other week for 2 months.  Dispense: 4 mL; Refill: 3      General Counseling: Babs verbalizes understanding of the findings of todays visit and agrees with plan of treatment. I have discussed any further diagnostic evaluation that may be needed or ordered today. We also reviewed her medications today. she has been encouraged to call the office with  any questions or concerns that should arise related to todays visit.    Orders Placed This Encounter  Procedures   Potassium   Ambulatory referral to Neurology    Meds ordered this encounter  Medications   potassium chloride  (KLOR-CON ) 8 MEQ tablet    Sig: Take 1 tablet by mouth MWF    Dispense:  30 tablet    Refill:  2   ergocalciferol  (DRISDOL ) 1.25 MG (50000 UT) capsule    Sig: Take one cap q week    Dispense:  12 capsule    Refill:  3   folic acid  (FOLVITE ) 1 MG tablet    Sig: Take 1 tablet (1 mg total) by mouth daily.    Dispense:  90 tablet    Refill:  1   promethazine  (PHENERGAN ) 6.25 MG/5ML solution  Sig: Take 10 mLs (12.5 mg total) by mouth every 8 (eight) hours as needed for nausea or vomiting. TAKE  10 ML BY MOUTH EVERY 8 HOURS AS NEEDED FOR NAUSEA AND VOMITING OR  REFRACTORY  NAUSEA/VOMITING    Dispense:  473 mL    Refill:  1   ondansetron  (ZOFRAN -ODT) 4 MG disintegrating tablet    Sig: Take 1 tablet (4 mg total) by mouth every 8 (eight) hours as needed for nausea or vomiting.    Dispense:  20 tablet    Refill:  0   cyanocobalamin  (VITAMIN B12) 1000 MCG/ML injection    Sig: Inject 1ML every week for 4 weeks and then every other week for 2 months.    Dispense:  4 mL    Refill:  3    Return in about 4 weeks (around 02/29/2024) for lab review.   Total time spent:40 Minutes Time spent includes review of chart, medications, test results, and follow up plan with the patient.   [place] Controlled Substance Database was reviewed by me.  This patient was seen by Tinnie Pro, PA-C in collaboration with Dr. Sigrid Bathe as a part of collaborative care agreement.  Tinnie Pro, PA-C Internal medicine

## 2024-02-02 ENCOUNTER — Telehealth: Payer: Self-pay | Admitting: Physician Assistant

## 2024-02-02 MED ORDER — CYANOCOBALAMIN 1000 MCG/ML IJ SOLN: INTRAMUSCULAR | 3 refills | Status: DC

## 2024-02-02 NOTE — Telephone Encounter (Signed)
 Awaiting 02/01/24 office notes for Neurology referral-Toni

## 2024-02-04 ENCOUNTER — Encounter

## 2024-02-15 ENCOUNTER — Other Ambulatory Visit: Payer: Self-pay

## 2024-02-15 DIAGNOSIS — I10 Essential (primary) hypertension: Secondary | ICD-10-CM

## 2024-02-15 MED ORDER — AMLODIPINE BESYLATE 10 MG PO TABS: 10.0000 mg | ORAL_TABLET | Freq: Every day | ORAL | 1 refills | Status: DC

## 2024-02-17 DIAGNOSIS — F4011 Social phobia, generalized: Secondary | ICD-10-CM | POA: Diagnosis not present

## 2024-02-17 DIAGNOSIS — F331 Major depressive disorder, recurrent, moderate: Secondary | ICD-10-CM | POA: Diagnosis not present

## 2024-02-17 DIAGNOSIS — F4001 Agoraphobia with panic disorder: Secondary | ICD-10-CM | POA: Diagnosis not present

## 2024-02-17 DIAGNOSIS — F4312 Post-traumatic stress disorder, chronic: Secondary | ICD-10-CM | POA: Diagnosis not present

## 2024-02-19 ENCOUNTER — Other Ambulatory Visit: Payer: Self-pay | Admitting: Physician Assistant

## 2024-02-19 ENCOUNTER — Ambulatory Visit: Admission: RE | Admit: 2024-02-19 | Source: Ambulatory Visit

## 2024-02-19 ENCOUNTER — Ambulatory Visit (INDEPENDENT_AMBULATORY_CARE_PROVIDER_SITE_OTHER): Admitting: Certified Nurse Midwife

## 2024-02-19 ENCOUNTER — Other Ambulatory Visit (HOSPITAL_COMMUNITY)
Admission: RE | Admit: 2024-02-19 | Discharge: 2024-02-19 | Disposition: A | Discharge status: Home / Self Care | Source: Ambulatory Visit | Attending: Certified Nurse Midwife | Admitting: Certified Nurse Midwife

## 2024-02-19 ENCOUNTER — Encounter: Payer: Self-pay | Admitting: Certified Nurse Midwife

## 2024-02-19 ENCOUNTER — Encounter: Payer: Self-pay | Admitting: Oncology

## 2024-02-19 ENCOUNTER — Ambulatory Visit
Admission: RE | Admit: 2024-02-19 | Discharge: 2024-02-19 | Disposition: A | Discharge status: Home / Self Care | Source: Ambulatory Visit | Attending: Physician Assistant | Admitting: Physician Assistant

## 2024-02-19 VITALS — BP 137/94 | HR 96 | Ht 72.0 in | Wt 173.5 lb

## 2024-02-19 DIAGNOSIS — Z87898 Personal history of other specified conditions: Secondary | ICD-10-CM | POA: Diagnosis not present

## 2024-02-19 DIAGNOSIS — R921 Mammographic calcification found on diagnostic imaging of breast: Secondary | ICD-10-CM

## 2024-02-19 DIAGNOSIS — Z124 Encounter for screening for malignant neoplasm of cervix: Secondary | ICD-10-CM | POA: Diagnosis not present

## 2024-02-19 DIAGNOSIS — Z1151 Encounter for screening for human papillomavirus (HPV): Secondary | ICD-10-CM | POA: Diagnosis not present

## 2024-02-19 DIAGNOSIS — R928 Other abnormal and inconclusive findings on diagnostic imaging of breast: Secondary | ICD-10-CM | POA: Diagnosis not present

## 2024-02-19 DIAGNOSIS — Z8489 Family history of other specified conditions: Secondary | ICD-10-CM

## 2024-02-19 DIAGNOSIS — N6452 Nipple discharge: Secondary | ICD-10-CM

## 2024-02-19 DIAGNOSIS — Z30013 Encounter for initial prescription of injectable contraceptive: Secondary | ICD-10-CM | POA: Diagnosis not present

## 2024-02-19 DIAGNOSIS — Z01419 Encounter for gynecological examination (general) (routine) without abnormal findings: Secondary | ICD-10-CM

## 2024-02-19 DIAGNOSIS — Z113 Encounter for screening for infections with a predominantly sexual mode of transmission: Secondary | ICD-10-CM | POA: Insufficient documentation

## 2024-02-19 DIAGNOSIS — Z1272 Encounter for screening for malignant neoplasm of vagina: Secondary | ICD-10-CM | POA: Diagnosis not present

## 2024-02-19 DIAGNOSIS — N921 Excessive and frequent menstruation with irregular cycle: Secondary | ICD-10-CM | POA: Diagnosis not present

## 2024-02-19 DIAGNOSIS — Z114 Encounter for screening for human immunodeficiency virus [HIV]: Secondary | ICD-10-CM

## 2024-02-19 MED ORDER — MEDROXYPROGESTERONE ACETATE 150 MG/ML IM SUSP: 150.0000 mg | INTRAMUSCULAR | 3 refills | Status: DC

## 2024-02-19 MED ORDER — MEDROXYPROGESTERONE ACETATE 150 MG/ML IM SUSP
150.0000 mg | Freq: Once | INTRAMUSCULAR | Status: AC
  Administered 2024-02-19: 150 mg via INTRAMUSCULAR

## 2024-02-19 NOTE — Progress Notes (Unsigned)
 Last Annual: 02/19/2024 Date last pap: 11/24/2019. Last Depo-Provera : Initial Dose. Side Effects if any: N/A. Serum HCG indicated? None. Depo-Provera  150 mg IM given by: Camelia Fetters, CMA. Next appointment due Sept 26. - Oct. 10

## 2024-02-19 NOTE — Patient Instructions (Signed)

## 2024-02-19 NOTE — Progress Notes (Unsigned)
 ANNUAL EXAM Patient name: Stacy Pineda MRN [id number]  Date of birth: 01/27/1981 Chief Complaint:   Gynecologic Exam (Break through bleeding with some cramping on the left side, ultrasound was never done last year because of scheduling would like another order for ultrasound.)  History of Present Illness:   Stacy Pineda is a 43 y.o. G62P0010 African-American female being seen today for a routine annual exam.  Current complaints: irregular bleeding-dark brown with foul odor, has happened over last few months, received last depo in January.  No LMP recorded. Patient has had an injection.   Upstream - 02/19/24 1326       Pregnancy Intention Screening   Does the patient want to become pregnant in the next year? No    Does the patient's partner want to become pregnant in the next year? No    Would the patient like to discuss contraceptive options today? Yes      Contraception Wrap Up   Current Method Hormonal Injection   Depo   End Method Hormonal Injection    Contraception Counseling Provided No         The pregnancy intention screening data noted above was reviewed. Potential methods of contraception were discussed. The patient elected to proceed with Hormonal Injection.      Component Value Date/Time   DIAGPAP  11/24/2019 1109    - Negative for intraepithelial lesion or malignancy (NILM)   HPVHIGH Negative 11/24/2019 1109   ADEQPAP  11/24/2019 1109    Satisfactory for evaluation; transformation zone component ABSENT.       Last pap 2021. Results were: NILM w/ HRHPV negative. H/O abnormal pap: no Last mammogram: scheduled today; 2021. Results were: abnormal had biopsy & clip placement. Family h/o breast cancer: yes   Last colonoscopy: 2023. Results were: normal. Family h/o colorectal cancer: no     02/19/2024    1:27 PM 05/30/2021    3:05 PM 12/14/2020    2:21 PM 10/22/2020    8:40 AM 04/17/2020    9:13 AM  Depression screen PHQ 2/9  Decreased Interest 3 3 3 1 2   Down,  Depressed, Hopeless 3 3 2  0 3  PHQ - 2 Score 6 6 5 1 5   Altered sleeping 3 3 3     Tired, decreased energy 3 3 3     Change in appetite 3 3 3     Feeling bad or failure about yourself  3 3 2     Trouble concentrating 3 3 2     Moving slowly or fidgety/restless 0 3 0    Suicidal thoughts 2 3 0    PHQ-9 Score 23 27 18     Difficult doing work/chores  Extremely dIfficult Very difficult          12/14/2020    2:22 PM  GAD 7 : Generalized Anxiety Score  Nervous, Anxious, on Edge 3  Control/stop worrying 3  Worry too much - different things 3  Trouble relaxing 3  Restless 1  Easily annoyed or irritable 1  Afraid - awful might happen 3  Total GAD 7 Score 17  Anxiety Difficulty Very difficult     Review of Systems:   Pertinent items are noted in HPI Denies any headaches, blurred vision, fatigue, shortness of breath, chest pain, abdominal pain, abnormal vaginal discharge/itching/odor/irritation, problems with periods, bowel movements, urination, or intercourse unless otherwise stated above. Pertinent History Reviewed:  Reviewed past medical,surgical, social and family history.  Reviewed problem list, medications and allergies. Physical Assessment:  Vitals:   02/19/24 1323  Weight: 173 lb 8 oz (78.7 kg)  Height: 6' (1.829 m)  Body mass index is 23.53 kg/m.       Physical Exam   No results found for this or any previous visit (from the past 24 hours).  Assessment & Plan:  1. Encounter for well woman exam (Primary)  2. Breakthrough bleeding on depo provera  - US  PELVIC COMPLETE WITH TRANSVAGINAL; Future  3. Family history of uterine fibroid - US  PELVIC COMPLETE WITH TRANSVAGINAL; Future   Mammogram: appointment today at 3pm Colonoscopy: per GI, or sooner if problems  Orders Placed This Encounter  Procedures  . US  PELVIC COMPLETE WITH TRANSVAGINAL    Meds:  Meds ordered this encounter  Medications  . medroxyPROGESTERone  (DEPO-PROVERA ) injection 150 mg  .  medroxyPROGESTERone  (DEPO-PROVERA ) 150 MG/ML injection    Sig: Inject 1 mL (150 mg total) into the muscle every 3 (three) months.    Dispense:  1 mL    Refill:  3    Follow-up: Return in 1 year (on 02/18/2025), or schedule pelvic ultrasound, for Annual exam.  Harlene LITTIE Cisco, CNM 02/19/2024 1:38 PM

## 2024-02-20 LAB — HEPATITIS C ANTIBODY: Hep C Virus Ab: NONREACTIVE

## 2024-02-20 LAB — RPR: RPR Ser Ql: NONREACTIVE

## 2024-02-20 LAB — HIV ANTIBODY (ROUTINE TESTING W REFLEX): HIV Screen 4th Generation wRfx: NONREACTIVE

## 2024-02-21 ENCOUNTER — Ambulatory Visit: Payer: Self-pay | Admitting: Certified Nurse Midwife

## 2024-02-22 DIAGNOSIS — R92333 Mammographic heterogeneous density, bilateral breasts: Secondary | ICD-10-CM | POA: Diagnosis not present

## 2024-02-22 DIAGNOSIS — R928 Other abnormal and inconclusive findings on diagnostic imaging of breast: Secondary | ICD-10-CM | POA: Diagnosis not present

## 2024-02-23 ENCOUNTER — Telehealth: Payer: Self-pay | Admitting: Physician Assistant

## 2024-02-23 LAB — CERVICOVAGINAL ANCILLARY ONLY
Bacterial Vaginitis (gardnerella): POSITIVE — AB
Candida Glabrata: NEGATIVE
Candida Vaginitis: NEGATIVE
Chlamydia: NEGATIVE
Comment: NEGATIVE
Comment: NEGATIVE
Comment: NEGATIVE
Comment: NEGATIVE
Comment: NEGATIVE
Comment: NORMAL
Neisseria Gonorrhea: NEGATIVE
Trichomonas: NEGATIVE

## 2024-02-23 MED ORDER — METRONIDAZOLE 500 MG PO TABS: 500.0000 mg | ORAL_TABLET | Freq: Two times a day (BID) | ORAL | 0 refills | Status: AC

## 2024-02-23 NOTE — Telephone Encounter (Signed)
 Neurology referral sent via [facility] to [facility].  Lvm notifying patient. Gave telephone# [phone]-Toni

## 2024-02-25 LAB — CYTOLOGY - PAP
Comment: NEGATIVE
Diagnosis: NEGATIVE
High risk HPV: NEGATIVE

## 2024-02-26 ENCOUNTER — Encounter: Payer: Self-pay | Admitting: Oncology

## 2024-02-26 DIAGNOSIS — E876 Hypokalemia: Secondary | ICD-10-CM | POA: Diagnosis not present

## 2024-02-27 LAB — POTASSIUM: Potassium: 3.8 mmol/L (ref 3.5–5.2)

## 2024-02-29 ENCOUNTER — Encounter

## 2024-02-29 ENCOUNTER — Ambulatory Visit (INDEPENDENT_AMBULATORY_CARE_PROVIDER_SITE_OTHER): Admitting: Physician Assistant

## 2024-02-29 ENCOUNTER — Other Ambulatory Visit

## 2024-02-29 ENCOUNTER — Encounter: Payer: Self-pay | Admitting: Physician Assistant

## 2024-02-29 ENCOUNTER — Ambulatory Visit
Admission: RE | Admit: 2024-02-29 | Discharge: 2024-02-29 | Disposition: A | Discharge status: Home / Self Care | Source: Ambulatory Visit | Attending: Physician Assistant | Admitting: Physician Assistant

## 2024-02-29 VITALS — BP 126/80 | HR 80 | Temp 98.4°F | Resp 16 | Ht 72.0 in | Wt 170.0 lb

## 2024-02-29 DIAGNOSIS — N6452 Nipple discharge: Secondary | ICD-10-CM | POA: Insufficient documentation

## 2024-02-29 DIAGNOSIS — R928 Other abnormal and inconclusive findings on diagnostic imaging of breast: Secondary | ICD-10-CM | POA: Insufficient documentation

## 2024-02-29 DIAGNOSIS — F17218 Nicotine dependence, cigarettes, with other nicotine-induced disorders: Secondary | ICD-10-CM | POA: Diagnosis not present

## 2024-02-29 DIAGNOSIS — E876 Hypokalemia: Secondary | ICD-10-CM

## 2024-02-29 DIAGNOSIS — I10 Essential (primary) hypertension: Secondary | ICD-10-CM | POA: Diagnosis not present

## 2024-02-29 DIAGNOSIS — Z87898 Personal history of other specified conditions: Secondary | ICD-10-CM | POA: Insufficient documentation

## 2024-02-29 DIAGNOSIS — G43909 Migraine, unspecified, not intractable, without status migrainosus: Secondary | ICD-10-CM

## 2024-02-29 MED ORDER — NURTEC 75 MG PO TBDP: 75.0000 mg | ORAL_TABLET | Freq: Every day | ORAL | 2 refills | Status: DC | PRN

## 2024-02-29 MED ORDER — HYDROCHLOROTHIAZIDE 12.5 MG PO TABS: 12.5000 mg | ORAL_TABLET | Freq: Every day | ORAL | 3 refills | Status: AC

## 2024-02-29 MED ORDER — VARENICLINE TARTRATE (STARTER) 0.5 MG X 11 & 1 MG X 42 PO TBPK: ORAL_TABLET | ORAL | 0 refills | Status: DC

## 2024-02-29 NOTE — Progress Notes (Signed)
 [facility] [address] [place], [place] [address]  Internal MEDICINE  Office Visit Note  Patient Name: Stacy Pineda  [id number]  [id number]  Date of Service: 03/01/2024  Chief Complaint  Patient presents with   Follow-up    Review Labs   Depression   Medication Refill    Hydrochlorothiazide  90-day supply, discuss Chantix     HPI Pt is here for routine follow up -Taking potassium MWF and potassium now normal, still some leg cramps. Mustard helps -Bp well controlled -tried nurtec sample and this helped with acute migraine, and will send for script. Still awaiting neurology -weight will fluctuate up and down due to flares with pancreatic insufficiency and nausea arise -does want to start on chantix  to help with smoking cessation, reviewed S/E that can occur with this and she would like to move forward  -did have US  of breast before appt today, no results yet  Current Medication: Outpatient Encounter Medications as of 02/29/2024  Medication Sig   albuterol  (VENTOLIN  HFA) 108 (90 Base) MCG/ACT inhaler Inhale 2 puffs into the lungs every 6 (six) hours as needed for wheezing or shortness of breath.   ALPRAZolam  (XANAX ) 0.5 MG tablet Take 0.5 mg by mouth 3 (three) times daily.   amLODipine  (NORVASC ) 10 MG tablet Take 1 tablet (10 mg total) by mouth daily.   AUVELITY 45-105 MG TBCR Take 1 tablet by mouth every morning.   Baclofen 5 MG TABS Take 1 tablet by mouth daily as needed.   Brexpiprazole (REXULTI) 3 MG TABS Take 3 mg by mouth.   cyanocobalamin  (VITAMIN B12) 1000 MCG/ML injection Inject every week for 4 weeks and then every other week for 2 months.   dicyclomine  (BENTYL ) 20 MG tablet Take 1 tablet (20 mg total) by mouth every 8 (eight) hours. As needed   ergocalciferol  (DRISDOL ) 1.25 MG (50000 UT) capsule Take one cap q week   folic acid  (FOLVITE ) 1 MG tablet Take 1 tablet (1 mg total) by mouth daily.   hydrocortisone  (ANUSOL -HC) 2.5 % rectal cream Place 1  Application rectally 2 (two) times daily.   lamoTRIgine (LAMICTAL) 200 MG tablet Take 200 mg by mouth at bedtime.   lisdexamfetamine (VYVANSE) 30 MG capsule Take 30 mg by mouth every morning.   medroxyPROGESTERone  (DEPO-PROVERA ) 150 MG/ML injection Inject 1 mL (150 mg total) into the muscle every 3 (three) months.   metroNIDAZOLE  (FLAGYL ) 500 MG tablet Take 1 tablet (500 mg total) by mouth 2 (two) times daily for 7 days.   Multiple Vitamins-Minerals (MULTIVITAMIN ADULT PO) Take 1 tablet by mouth.   omeprazole  (PRILOSEC) 40 MG capsule Take 1 capsule (40 mg total) by mouth daily before breakfast.   ondansetron  (ZOFRAN -ODT) 4 MG disintegrating tablet Take 1 tablet (4 mg total) by mouth every 8 (eight) hours as needed for nausea or vomiting.   potassium chloride  (KLOR-CON ) 8 MEQ tablet Take 1 tablet by mouth MWF   prazosin  (MINIPRESS ) 5 MG capsule Take 5 mg by mouth at bedtime.   promethazine  (PHENERGAN ) 6.25 MG/5ML solution Take 10 mLs (12.5 mg total) by mouth every 8 (eight) hours as needed for nausea or vomiting. TAKE  10 ML BY MOUTH EVERY 8 HOURS AS NEEDED FOR NAUSEA AND VOMITING OR  REFRACTORY  NAUSEA/VOMITING   Rimegepant Sulfate (NURTEC) 75 MG TBDP Take 1 tablet (75 mg total) by mouth daily as needed.   Suvorexant (BELSOMRA) 5 MG TABS Take 5 mg by mouth daily.   TRINTELLIX 20 MG TABS tablet Take  20 mg by mouth daily.   Varenicline  Tartrate, Starter, (CHANTIX  STARTING MONTH PAK) 0.5 MG X 11 & 1 MG X 42 TBPK Use as directed   [DISCONTINUED] hydrochlorothiazide  (HYDRODIURIL ) 12.5 MG tablet Take 1 tablet by mouth once daily   hydrochlorothiazide  (HYDRODIURIL ) 12.5 MG tablet Take 1 tablet (12.5 mg total) by mouth daily.   No facility-administered encounter medications on file as of 02/29/2024.    Surgical History: Past Surgical History:  Procedure Laterality Date   ABDOMINAL SURGERY     BREAST BIOPSY Right 04/27/2020   stereo bx, coil clip, path pending    CHOLECYSTECTOMY     COLONOSCOPY  WITH PROPOFOL  N/A 09/08/2018   Procedure: COLONOSCOPY WITH PROPOFOL ;  Surgeon: Janalyn Keene NOVAK, MD;  Location: [facility];  Service: Endoscopy;  Laterality: N/A;   COLONOSCOPY WITH PROPOFOL  N/A 08/29/2021   Procedure: COLONOSCOPY WITH PROPOFOL ;  Surgeon: Unk Corinn Skiff, MD;  Location[facility];  Service: Endoscopy;  Laterality: N/A;   DECUBITUS ULCER EXCISION     DILATION AND CURETTAGE OF UTERUS     ESOPHAGOGASTRODUODENOSCOPY (EGD) WITH PROPOFOL  N/A 09/08/2018   Procedure: ESOPHAGOGASTRODUODENOSCOPY (EGD) WITH PROPOFOL ;  Surgeon: Janalyn Keene NOVAK, MD;  Location: [facility];  Service: Endoscopy;  Laterality: N/A;   ESOPHAGOGASTRODUODENOSCOPY (EGD) WITH PROPOFOL  N/A 08/29/2021   Procedure: ESOPHAGOGASTRODUODENOSCOPY (EGD) WITH PROPOFOL ;  Surgeon: Unk Corinn Skiff, MD;  Location: [facility];  Service: Endoscopy;  Laterality: N/A;   ESOPHAGOGASTRODUODENOSCOPY (EGD) WITH PROPOFOL  N/A 04/10/2022   Procedure: ESOPHAGOGASTRODUODENOSCOPY (EGD) WITH PROPOFOL ;  Surgeon: Unk Corinn Skiff, MD;  Location: [facility];  Service: Gastroenterology;  Laterality: N/A;   GASTRIC BYPASS     GASTRIC BYPASS     REVISION GASTRIC RESTRICTIVE PROCEDURE FOR MORBID OBESITY      Medical History: Past Medical History:  Diagnosis Date   Anxiety    Asthma    Bilateral ovarian cysts    Chronic headaches    migraines about 1x/month   Depression    DVT (deep venous thrombosis) (HCC) 07/2014   right upper extremity   Hypothyroidism    Pancreatitis    Pancreatitis    PTSD (post-traumatic stress disorder)    Wears contact lenses     Family History: Family History  Problem Relation Age of Onset   Graves' disease Mother    Hypertension Mother    Hyperlipidemia Mother    Anxiety disorder Father    Prostate cancer Father    Depression Brother    Anxiety disorder Brother    Anxiety disorder Maternal Aunt    Depression Maternal Aunt    Anxiety disorder Paternal Aunt     Depression Paternal Aunt    Breast cancer Paternal Aunt    Anxiety disorder Maternal Uncle    Depression Maternal Uncle    Anxiety disorder Paternal Uncle    Depression Paternal Uncle    Anxiety disorder Maternal Grandfather    Depression Maternal Grandfather    Diabetes Maternal Grandfather    Anxiety disorder Maternal Grandmother    Depression Maternal Grandmother    Breast cancer Maternal Grandmother    Ovarian cancer Maternal Grandmother    Diabetes Maternal Grandmother    Colon cancer Neg Hx     Social History   Socioeconomic History   Marital status: Single    Spouse name: Not on file   Number of children: Not on file   Years of education: Not on file   Highest education level: Not on file  Occupational History   Not  on file  Tobacco Use   Smoking status: Some Days    Current packs/day: 0.25    Average packs/day: 0.3 packs/day for 18.9 years (4.7 ttl pk-yrs)    Types: Cigarettes    Start date: 03/26/2005   Smokeless tobacco: Never   Tobacco comments:    2 cigarettes daily  Vaping Use   Vaping status: Never Used  Substance and Sexual Activity   Alcohol use: No    Alcohol/week: 0.0 standard drinks of alcohol   Drug use: Yes    Frequency: 3.0 times per week    Types: Marijuana    Comment: last used 2 weeks ago   Sexual activity: Not Currently    Birth control/protection: Injection  Other Topics Concern   Not on file  Social History Narrative   Not on file   Social Drivers of Health   Financial Resource Strain: High Risk (05/06/2019)   Received from [facility]   Overall Financial Resource Strain (CARDIA)    Difficulty of Paying Living Expenses: Hard  Food Insecurity: Food Insecurity Present (05/06/2019)   Received from [facility]   Hunger Vital Sign    Within the past 12 months, you worried that your food would run out before you got the money to buy more.: Never true    Within the past 12 months, the food you bought just didn't last and you  didn't have money to get more.: Sometimes true  Transportation Needs: No Transportation Needs (05/06/2019)   Received from [facility]   PRAPARE - Transportation    Lack of Transportation (Medical): No    Lack of Transportation (Non-Medical): No  Physical Activity: Not on file  Stress: Not on file  Social Connections: Not on file  Intimate Partner Violence: Not on file      Review of Systems  Constitutional:  Negative for chills and fatigue.  HENT:  Positive for postnasal drip. Negative for congestion, rhinorrhea, sneezing and sore throat.   Eyes:  Negative for redness.  Respiratory:  Negative for cough, chest tightness and shortness of breath.   Cardiovascular:  Negative for chest pain and palpitations.  Gastrointestinal:  Positive for abdominal pain and nausea. Negative for constipation and diarrhea.  Genitourinary:  Negative for dysuria and frequency.  Musculoskeletal:  Negative for arthralgias, back pain, joint swelling and neck pain.  Skin:  Negative for rash.  Neurological:  Negative for tremors and numbness.  Hematological:  Negative for adenopathy. Does not bruise/bleed easily.  Psychiatric/Behavioral:  Negative for behavioral problems (Depression), sleep disturbance and suicidal ideas. The patient is nervous/anxious.     Vital Signs: BP 126/80   Pulse 80   Temp 98.4 F (36.9 C)   Resp 16   Ht 6' (1.829 m)   Wt 170 lb (77.1 kg)   SpO2 100%   BMI 23.06 kg/m    Physical Exam Vitals and nursing note reviewed.  Constitutional:      General: She is not in acute distress.    Appearance: Normal appearance. She is well-developed. She is not diaphoretic.  HENT:     Head: Normocephalic and atraumatic.  Eyes:     Extraocular Movements: Extraocular movements intact.  Neck:     Thyroid : No thyromegaly.     Vascular: No JVD.     Trachea: No tracheal deviation.  Cardiovascular:     Rate and Rhythm: Normal rate and regular rhythm.     Heart sounds: Normal heart  sounds. No murmur heard.  No friction rub. No gallop.  Pulmonary:     Effort: Pulmonary effort is normal. No respiratory distress.     Breath sounds: No wheezing or rales.  Musculoskeletal:        General: Normal range of motion.  Skin:    General: Skin is warm and dry.  Neurological:     Mental Status: She is alert and oriented to person, place, and time.  Psychiatric:        Behavior: Behavior normal.        Thought Content: Thought content normal.        Judgment: Judgment normal.        Assessment/Plan: 1. Essential hypertension Stable, continue current medication - hydrochlorothiazide  (HYDRODIURIL ) 12.5 MG tablet; Take 1 tablet (12.5 mg total) by mouth daily.  Dispense: 90 tablet; Refill: 3  2. Cigarette nicotine  dependence with other nicotine -induced disorder (Primary) Will start on chantix  to help with smoking cessation, pt aware of possible S/E - Varenicline  Tartrate, Starter, (CHANTIX  STARTING MONTH PAK) 0.5 MG X 11 & 1 MG X 42 TBPK; Use as directed  Dispense: 53 each; Refill: 0  3. Migraine without status migrainosus, not intractable, unspecified migraine type - Rimegepant Sulfate (NURTEC) 75 MG TBDP; Take 1 tablet (75 mg total) by mouth daily as needed.  Dispense: 16 tablet; Refill: 2  4. Hypokalemia Improved, continue potassium supplement MWF as before   General Counseling: Arthi verbalizes understanding of the findings of todays visit and agrees with plan of treatment. I have discussed any further diagnostic evaluation that may be needed or ordered today. We also reviewed her medications today. she has been encouraged to call the office with any questions or concerns that should arise related to todays visit.    No orders of the defined types were placed in this encounter.   Meds ordered this encounter  Medications   hydrochlorothiazide  (HYDRODIURIL ) 12.5 MG tablet    Sig: Take 1 tablet (12.5 mg total) by mouth daily.    Dispense:  90 tablet    Refill:   3   Varenicline  Tartrate, Starter, (CHANTIX  STARTING MONTH PAK) 0.5 MG X 11 & 1 MG X 42 TBPK    Sig: Use as directed    Dispense:  53 each    Refill:  0   Rimegepant Sulfate (NURTEC) 75 MG TBDP    Sig: Take 1 tablet (75 mg total) by mouth daily as needed.    Dispense:  16 tablet    Refill:  2    This patient was seen by Tinnie Pro, PA-C in collaboration with Dr. Sigrid Bathe as a part of collaborative care agreement.   Total time spent:30 Minutes Time spent includes review of chart, medications, test results, and follow up plan with the patient.      Dr Fozia M Khan Internal medicine

## 2024-03-02 ENCOUNTER — Other Ambulatory Visit

## 2024-03-11 DIAGNOSIS — F331 Major depressive disorder, recurrent, moderate: Secondary | ICD-10-CM | POA: Diagnosis not present

## 2024-03-11 DIAGNOSIS — F4001 Agoraphobia with panic disorder: Secondary | ICD-10-CM | POA: Diagnosis not present

## 2024-03-11 DIAGNOSIS — F4312 Post-traumatic stress disorder, chronic: Secondary | ICD-10-CM | POA: Diagnosis not present

## 2024-03-11 DIAGNOSIS — F4011 Social phobia, generalized: Secondary | ICD-10-CM | POA: Diagnosis not present

## 2024-03-14 ENCOUNTER — Other Ambulatory Visit: Payer: Self-pay | Admitting: Certified Nurse Midwife

## 2024-03-14 ENCOUNTER — Ambulatory Visit (INDEPENDENT_AMBULATORY_CARE_PROVIDER_SITE_OTHER)

## 2024-03-14 DIAGNOSIS — Z8489 Family history of other specified conditions: Secondary | ICD-10-CM | POA: Diagnosis not present

## 2024-03-14 DIAGNOSIS — N921 Excessive and frequent menstruation with irregular cycle: Secondary | ICD-10-CM

## 2024-03-14 DIAGNOSIS — Z124 Encounter for screening for malignant neoplasm of cervix: Secondary | ICD-10-CM

## 2024-03-14 DIAGNOSIS — Z1151 Encounter for screening for human papillomavirus (HPV): Secondary | ICD-10-CM

## 2024-03-14 DIAGNOSIS — Z114 Encounter for screening for human immunodeficiency virus [HIV]: Secondary | ICD-10-CM

## 2024-03-14 DIAGNOSIS — Z01419 Encounter for gynecological examination (general) (routine) without abnormal findings: Secondary | ICD-10-CM

## 2024-03-14 DIAGNOSIS — Z30013 Encounter for initial prescription of injectable contraceptive: Secondary | ICD-10-CM

## 2024-03-14 DIAGNOSIS — Z113 Encounter for screening for infections with a predominantly sexual mode of transmission: Secondary | ICD-10-CM

## 2024-03-18 ENCOUNTER — Telehealth: Payer: Self-pay

## 2024-03-18 NOTE — Telephone Encounter (Signed)
 Patient is calling about her ultrasound results. Ultrasound was done on 03/14/24. Janit is out of the office. Patient is anxious to hear about results. Could you please call her and discuss results with her.

## 2024-03-21 ENCOUNTER — Telehealth: Payer: Self-pay

## 2024-03-21 NOTE — Telephone Encounter (Signed)
 Returned pt call. She wanted someone to discuss her ultrasound report. I left a voice message explaining that when Harlene returns in office and sees the results she will call and discuss results.

## 2024-03-25 ENCOUNTER — Telehealth: Payer: Self-pay | Admitting: Physician Assistant

## 2024-03-25 NOTE — Telephone Encounter (Signed)
 Neurology appointment 05/17/2024 @ [facility]

## 2024-03-31 ENCOUNTER — Encounter: Payer: Self-pay | Admitting: Certified Nurse Midwife

## 2024-04-12 ENCOUNTER — Other Ambulatory Visit: Payer: Self-pay | Admitting: Physician Assistant

## 2024-04-12 ENCOUNTER — Telehealth: Payer: Self-pay

## 2024-04-12 DIAGNOSIS — F17218 Nicotine dependence, cigarettes, with other nicotine-induced disorders: Secondary | ICD-10-CM

## 2024-04-12 MED ORDER — VARENICLINE TARTRATE 1 MG PO TABS
1.0000 mg | ORAL_TABLET | Freq: Two times a day (BID) | ORAL | 1 refills | Status: DC
Start: 2024-04-12 — End: 2024-06-27

## 2024-04-12 NOTE — Telephone Encounter (Signed)
 [name] that we sent chantix 

## 2024-04-13 ENCOUNTER — Other Ambulatory Visit: Payer: Self-pay | Admitting: Certified Nurse Midwife

## 2024-04-13 DIAGNOSIS — F4011 Social phobia, generalized: Secondary | ICD-10-CM | POA: Diagnosis not present

## 2024-04-13 DIAGNOSIS — F4001 Agoraphobia with panic disorder: Secondary | ICD-10-CM | POA: Diagnosis not present

## 2024-04-13 DIAGNOSIS — F50819 Binge eating disorder, unspecified: Secondary | ICD-10-CM | POA: Diagnosis not present

## 2024-04-13 DIAGNOSIS — F331 Major depressive disorder, recurrent, moderate: Secondary | ICD-10-CM | POA: Diagnosis not present

## 2024-04-13 MED ORDER — LORAZEPAM 1 MG PO TABS
1.0000 mg | ORAL_TABLET | Freq: Once | ORAL | 0 refills | Status: DC | PRN
Start: 2024-04-13 — End: 2024-04-19

## 2024-04-13 NOTE — Progress Notes (Signed)
 One time pre-procedure dose of ativan  prescribed for endometrial biopsy 9/9.

## 2024-04-18 NOTE — Progress Notes (Unsigned)
 McDonough, Tinnie POUR, PA-C   No chief complaint on file.   HPI:      Ms. Stacy Pineda is a 43 y.o. G1P0010 whose LMP was No LMP recorded. Patient has had an injection., presents today for *** Hx of DVT   Patient Active Problem List   Diagnosis Date Noted   Pylorus ulcer    Erosive esophagitis    Removal of staples    History of DVT (deep vein thrombosis) 09/02/2019   Encounter for Depo-Provera  contraception 07/23/2019   Jejunostomy tube present (HCC) 05/31/2019   Encounter for long-term (current) use of medications 05/02/2019   Acquired hypothyroidism 03/25/2019   B12 deficiency 02/23/2019   Vitamin D  deficiency 02/23/2019   Gastroenteritis presumed infectious 01/26/2019   Migraine without status migrainosus, not intractable 01/26/2019   Acute recurrent pansinusitis 01/03/2019   Breakthrough bleeding on depo provera  10/29/2018   Family history of uterine fibroid 10/29/2018   History of ovarian cyst 10/29/2018   Esophageal dysphagia    Nausea and vomiting    Internal hemorrhoid    Diarrhea    Rectal bleeding    Intractable migraine without aura and with status migrainosus 10/27/2016   Nausea vomiting and diarrhea 10/25/2016   MDD (major depressive disorder), recurrent episode, moderate (HCC) 06/23/2016   PTSD (post-traumatic stress disorder) 06/23/2016   GAD (generalized anxiety disorder) 06/23/2016   Flatulence, eructation and gas pain 01/29/2016   Adiposity 01/29/2016   Encounter for other preprocedural examination 01/29/2016   Chronic pancreatitis (HCC) 09/06/2015   Headache 09/06/2015   Asthma, mild intermittent 09/06/2015   Iron  deficiency anemia 03/29/2015   Deep vein thrombosis (HCC) 07/20/2014   Deep vein thrombosis (DVT) (HCC) 07/20/2014   Disorder of pancreatic duct 02/13/2014   Chronic abdominal pain 02/13/2014   Chronic diarrhea 02/13/2014   Anastomotic ulcer 04/29/2012   Anxiety state 04/06/2012   H/O gastrostomy 04/06/2012   History of  gastrostomy 04/06/2012   Abdominal pain, epigastric 02/18/2012   Acute inflammation of the pancreas 12/23/2011   H/O surgical procedure 10/17/2011   History of surgical procedure 10/17/2011   Bacterial pneumonia 06/19/2011   Clinical depression 02/18/2011   Disease of female genital organs 02/18/2011   Essential (primary) hypertension 02/18/2011   Family history of cardiovascular disease 02/18/2011   Family history of diabetes mellitus 02/18/2011   Family history of breast cancer 02/18/2011   Morbidity or mortality, unknown cause 02/18/2011   Compulsive tobacco user syndrome 02/18/2011   Current tobacco use 02/18/2011   Illness 02/18/2011    Past Surgical History:  Procedure Laterality Date   ABDOMINAL SURGERY     BREAST BIOPSY Right 04/27/2020   stereo bx, coil clip, path pending    CHOLECYSTECTOMY     COLONOSCOPY WITH PROPOFOL  N/A 09/08/2018   Procedure: COLONOSCOPY WITH PROPOFOL ;  Surgeon: Janalyn Keene NOVAK, MD;  Location: [facility];  Service: Endoscopy;  Laterality: N/A;   COLONOSCOPY WITH PROPOFOL  N/A 08/29/2021   Procedure: COLONOSCOPY WITH PROPOFOL ;  Surgeon: Unk Corinn Skiff, MD;  Location: [facility];  Service: Endoscopy;  Laterality: N/A;   DECUBITUS ULCER EXCISION     DILATION AND CURETTAGE OF UTERUS     ESOPHAGOGASTRODUODENOSCOPY (EGD) WITH PROPOFOL  N/A 09/08/2018   Procedure: ESOPHAGOGASTRODUODENOSCOPY (EGD) WITH PROPOFOL ;  Surgeon: Janalyn Keene NOVAK, MD;  Location: [facility];  Service: Endoscopy;  Laterality: N/A;   ESOPHAGOGASTRODUODENOSCOPY (EGD) WITH PROPOFOL  N/A 08/29/2021   Procedure: ESOPHAGOGASTRODUODENOSCOPY (EGD) WITH PROPOFOL ;  Surgeon: Unk Corinn Skiff, MD;  Location: [facility];  Service: Endoscopy;  Laterality: N/A;   ESOPHAGOGASTRODUODENOSCOPY (EGD) WITH PROPOFOL  N/A 04/10/2022   Procedure: ESOPHAGOGASTRODUODENOSCOPY (EGD) WITH PROPOFOL ;  Surgeon: Unk Corinn Skiff, MD;  Location: [facility];  Service:  Gastroenterology;  Laterality: N/A;   GASTRIC BYPASS     GASTRIC BYPASS     REVISION GASTRIC RESTRICTIVE PROCEDURE FOR MORBID OBESITY      Family History  Problem Relation Age of Onset   Graves' disease Mother    Hypertension Mother    Hyperlipidemia Mother    Anxiety disorder Father    Prostate cancer Father    Depression Brother    Anxiety disorder Brother    Anxiety disorder Maternal Aunt    Depression Maternal Aunt    Anxiety disorder Paternal Aunt    Depression Paternal Aunt    Breast cancer Paternal Aunt    Anxiety disorder Maternal Uncle    Depression Maternal Uncle    Anxiety disorder Paternal Uncle    Depression Paternal Uncle    Anxiety disorder Maternal Grandfather    Depression Maternal Grandfather    Diabetes Maternal Grandfather    Anxiety disorder Maternal Grandmother    Depression Maternal Grandmother    Breast cancer Maternal Grandmother    Ovarian cancer Maternal Grandmother    Diabetes Maternal Grandmother    Colon cancer Neg Hx     Social History   Socioeconomic History   Marital status: Single    Spouse name: Not on file   Number of children: Not on file   Years of education: Not on file   Highest education level: Not on file  Occupational History   Not on file  Tobacco Use   Smoking status: Some Days    Current packs/day: 0.25    Average packs/day: 0.3 packs/day for 19.1 years (4.8 ttl pk-yrs)    Types: Cigarettes    Start date: 03/26/2005   Smokeless tobacco: Never   Tobacco comments:    2 cigarettes daily  Vaping Use   Vaping status: Never Used  Substance and Sexual Activity   Alcohol use: No    Alcohol/week: 0.0 standard drinks of alcohol   Drug use: Yes    Frequency: 3.0 times per week    Types: Marijuana    Comment: last used 2 weeks ago   Sexual activity: Not Currently    Birth control/protection: Injection  Other Topics Concern   Not on file  Social History Narrative   Not on file   Social Drivers of Health    Financial Resource Strain: High Risk (05/06/2019)   Received from [facility]   Overall Financial Resource Strain (CARDIA)    Difficulty of Paying Living Expenses: Hard  Food Insecurity: Food Insecurity Present (05/06/2019)   Received from [facility]   Hunger Vital Sign    Within the past 12 months, you worried that your food would run out before you got the money to buy more.: Never true    Within the past 12 months, the food you bought just didn't last and you didn't have money to get more.: Sometimes true  Transportation Needs: No Transportation Needs (05/06/2019)   Received from [facility]   PRAPARE - Transportation    Lack of Transportation (Medical): No    Lack of Transportation (Non-Medical): No  Physical Activity: Not on file  Stress: Not on file  Social Connections: Not on file  Intimate Partner Violence: Not on file    Outpatient Medications Prior to Visit  Medication  Sig Dispense Refill   LORazepam  (ATIVAN ) 1 MG tablet Take 1 tablet (1 mg total) by mouth once as needed for up to 1 dose for anxiety (before procedure). 1 tablet 0   albuterol  (VENTOLIN  HFA) 108 (90 Base) MCG/ACT inhaler Inhale 2 puffs into the lungs every 6 (six) hours as needed for wheezing or shortness of breath. 8 g 0   ALPRAZolam  (XANAX ) 0.5 MG tablet Take 0.5 mg by mouth 3 (three) times daily.     amLODipine  (NORVASC ) 10 MG tablet Take 1 tablet (10 mg total) by mouth daily. 90 tablet 1   AUVELITY 45-105 MG TBCR Take 1 tablet by mouth every morning.     Baclofen 5 MG TABS Take 1 tablet by mouth daily as needed.     Brexpiprazole (REXULTI) 3 MG TABS Take 3 mg by mouth.     cyanocobalamin  (VITAMIN B12) 1000 MCG/ML injection Inject every week for 4 weeks and then every other week for 2 months. 4 mL 3   dicyclomine  (BENTYL ) 20 MG tablet Take 1 tablet (20 mg total) by mouth every 8 (eight) hours. As needed 120 tablet 1   ergocalciferol  (DRISDOL ) 1.25 MG (50000 UT) capsule Take one cap q week  12 capsule 3   folic acid  (FOLVITE ) 1 MG tablet Take 1 tablet (1 mg total) by mouth daily. 90 tablet 1   hydrochlorothiazide  (HYDRODIURIL ) 12.5 MG tablet Take 1 tablet (12.5 mg total) by mouth daily. 90 tablet 3   hydrocortisone  (ANUSOL -HC) 2.5 % rectal cream Place 1 Application rectally 2 (two) times daily. 30 g 1   lamoTRIgine (LAMICTAL) 200 MG tablet Take 200 mg by mouth at bedtime.     lisdexamfetamine (VYVANSE) 30 MG capsule Take 30 mg by mouth every morning.     medroxyPROGESTERone  (DEPO-PROVERA ) 150 MG/ML injection Inject 1 mL (150 mg total) into the muscle every 3 (three) months. 1 mL 3   Multiple Vitamins-Minerals (MULTIVITAMIN ADULT PO) Take 1 tablet by mouth.     omeprazole  (PRILOSEC) 40 MG capsule Take 1 capsule (40 mg total) by mouth daily before breakfast. 90 capsule 3   ondansetron  (ZOFRAN -ODT) 4 MG disintegrating tablet Take 1 tablet (4 mg total) by mouth every 8 (eight) hours as needed for nausea or vomiting. 20 tablet 0   potassium chloride  (KLOR-CON ) 8 MEQ tablet Take 1 tablet by mouth MWF 30 tablet 2   prazosin  (MINIPRESS ) 5 MG capsule Take 5 mg by mouth at bedtime.     promethazine  (PHENERGAN ) 6.25 MG/5ML solution Take 10 mLs (12.5 mg total) by mouth every 8 (eight) hours as needed for nausea or vomiting. TAKE  10 ML BY MOUTH EVERY 8 HOURS AS NEEDED FOR NAUSEA AND VOMITING OR  REFRACTORY  NAUSEA/VOMITING 473 mL 1   Rimegepant Sulfate (NURTEC) 75 MG TBDP Take 1 tablet (75 mg total) by mouth daily as needed. 16 tablet 2   Suvorexant (BELSOMRA) 5 MG TABS Take 5 mg by mouth daily.     TRINTELLIX 20 MG TABS tablet Take 20 mg by mouth daily.     varenicline  (CHANTIX  CONTINUING MONTH PAK) 1 MG tablet Take 1 tablet (1 mg total) by mouth 2 (two) times daily. 60 tablet 1   No facility-administered medications prior to visit.      ROS:  Review of Systems BREAST: No symptoms   OBJECTIVE:   Vitals:  There were no vitals taken for this visit.  Physical Exam  Results: No  results found for this or any previous visit (  from the past 24 hours).   Assessment/Plan: No diagnosis found.    No orders of the defined types were placed in this encounter.     No follow-ups on file.  Silvia Markuson B. Oryan Winterton, PA-C 04/18/2024 4:52 PM

## 2024-04-19 ENCOUNTER — Encounter: Payer: Self-pay | Admitting: Obstetrics and Gynecology

## 2024-04-19 ENCOUNTER — Ambulatory Visit (INDEPENDENT_AMBULATORY_CARE_PROVIDER_SITE_OTHER): Admitting: Obstetrics and Gynecology

## 2024-04-19 VITALS — BP 116/78 | HR 96 | Ht 72.0 in | Wt 170.0 lb

## 2024-04-19 DIAGNOSIS — N921 Excessive and frequent menstruation with irregular cycle: Secondary | ICD-10-CM

## 2024-04-19 DIAGNOSIS — N939 Abnormal uterine and vaginal bleeding, unspecified: Secondary | ICD-10-CM

## 2024-04-19 DIAGNOSIS — R102 Pelvic and perineal pain: Secondary | ICD-10-CM

## 2024-04-19 MED ORDER — NORETHINDRONE 0.35 MG PO TABS: 1.0000 | ORAL_TABLET | Freq: Every day | ORAL | 0 refills | Status: DC

## 2024-04-19 NOTE — Patient Instructions (Signed)
 I value your feedback and you entrusting [place] with your care. If you get a [place] patient survey, I would appreciate you taking the time to let us know about your experience today. Thank you! ? ? ?

## 2024-05-11 ENCOUNTER — Encounter: Payer: Self-pay | Admitting: Oncology

## 2024-05-12 ENCOUNTER — Encounter (INDEPENDENT_AMBULATORY_CARE_PROVIDER_SITE_OTHER): Payer: Self-pay

## 2024-05-13 ENCOUNTER — Ambulatory Visit
Admission: RE | Admit: 2024-05-13 | Discharge: 2024-05-13 | Disposition: A | Discharge status: Home / Self Care | Source: Ambulatory Visit | Attending: Obstetrics and Gynecology | Admitting: Obstetrics and Gynecology

## 2024-05-13 DIAGNOSIS — N939 Abnormal uterine and vaginal bleeding, unspecified: Secondary | ICD-10-CM | POA: Insufficient documentation

## 2024-05-13 DIAGNOSIS — G8929 Other chronic pain: Secondary | ICD-10-CM | POA: Diagnosis not present

## 2024-05-13 DIAGNOSIS — R102 Pelvic and perineal pain unspecified side: Secondary | ICD-10-CM | POA: Insufficient documentation

## 2024-05-13 MED ORDER — GADOBUTROL 1 MMOL/ML IV SOLN
7.0000 mL | Freq: Once | INTRAVENOUS | Status: AC | PRN
  Administered 2024-05-13: 7 mL via INTRAVENOUS

## 2024-05-16 ENCOUNTER — Ambulatory Visit: Payer: Self-pay | Admitting: Obstetrics and Gynecology

## 2024-05-17 ENCOUNTER — Other Ambulatory Visit: Payer: Self-pay | Admitting: Physician Assistant

## 2024-05-17 DIAGNOSIS — E538 Deficiency of other specified B group vitamins: Secondary | ICD-10-CM

## 2024-05-19 DIAGNOSIS — R110 Nausea: Secondary | ICD-10-CM | POA: Diagnosis not present

## 2024-05-19 DIAGNOSIS — H53149 Visual discomfort, unspecified: Secondary | ICD-10-CM | POA: Diagnosis not present

## 2024-05-19 DIAGNOSIS — G43709 Chronic migraine without aura, not intractable, without status migrainosus: Secondary | ICD-10-CM | POA: Diagnosis not present

## 2024-05-24 ENCOUNTER — Other Ambulatory Visit: Payer: Self-pay | Admitting: Physician Assistant

## 2024-05-24 DIAGNOSIS — R519 Headache, unspecified: Secondary | ICD-10-CM

## 2024-05-27 ENCOUNTER — Ambulatory Visit
Admission: RE | Admit: 2024-05-27 | Discharge: 2024-05-27 | Disposition: A | Source: Ambulatory Visit | Attending: Physician Assistant | Admitting: Physician Assistant

## 2024-05-27 DIAGNOSIS — R519 Headache, unspecified: Secondary | ICD-10-CM | POA: Insufficient documentation

## 2024-05-27 DIAGNOSIS — J322 Chronic ethmoidal sinusitis: Secondary | ICD-10-CM | POA: Diagnosis not present

## 2024-05-27 DIAGNOSIS — J341 Cyst and mucocele of nose and nasal sinus: Secondary | ICD-10-CM | POA: Diagnosis not present

## 2024-05-30 ENCOUNTER — Ambulatory Visit: Admitting: Physician Assistant

## 2024-06-23 DIAGNOSIS — F331 Major depressive disorder, recurrent, moderate: Secondary | ICD-10-CM | POA: Diagnosis not present

## 2024-06-23 DIAGNOSIS — F410 Panic disorder [episodic paroxysmal anxiety] without agoraphobia: Secondary | ICD-10-CM | POA: Diagnosis not present

## 2024-06-23 DIAGNOSIS — F4312 Post-traumatic stress disorder, chronic: Secondary | ICD-10-CM | POA: Diagnosis not present

## 2024-06-27 ENCOUNTER — Encounter: Payer: Self-pay | Admitting: Physician Assistant

## 2024-06-27 ENCOUNTER — Ambulatory Visit (INDEPENDENT_AMBULATORY_CARE_PROVIDER_SITE_OTHER): Admitting: Physician Assistant

## 2024-06-27 VITALS — BP 122/75 | HR 82 | Temp 98.0°F | Resp 16 | Ht 72.0 in | Wt 162.0 lb

## 2024-06-27 DIAGNOSIS — N6452 Nipple discharge: Secondary | ICD-10-CM | POA: Diagnosis not present

## 2024-06-27 DIAGNOSIS — J349 Unspecified disorder of nose and nasal sinuses: Secondary | ICD-10-CM | POA: Diagnosis not present

## 2024-06-27 DIAGNOSIS — E876 Hypokalemia: Secondary | ICD-10-CM | POA: Diagnosis not present

## 2024-06-27 DIAGNOSIS — J341 Cyst and mucocele of nose and nasal sinus: Secondary | ICD-10-CM | POA: Diagnosis not present

## 2024-06-27 DIAGNOSIS — I10 Essential (primary) hypertension: Secondary | ICD-10-CM | POA: Diagnosis not present

## 2024-06-27 DIAGNOSIS — G43909 Migraine, unspecified, not intractable, without status migrainosus: Secondary | ICD-10-CM

## 2024-06-27 DIAGNOSIS — R112 Nausea with vomiting, unspecified: Secondary | ICD-10-CM | POA: Diagnosis not present

## 2024-06-27 DIAGNOSIS — K8689 Other specified diseases of pancreas: Secondary | ICD-10-CM | POA: Diagnosis not present

## 2024-06-27 DIAGNOSIS — R634 Abnormal weight loss: Secondary | ICD-10-CM | POA: Diagnosis not present

## 2024-06-27 MED ORDER — POTASSIUM CHLORIDE ER 8 MEQ PO TBCR: EXTENDED_RELEASE_TABLET | ORAL | 2 refills | Status: AC

## 2024-06-27 MED ORDER — NURTEC 75 MG PO TBDP: ORAL_TABLET | ORAL | 3 refills | Status: AC

## 2024-06-27 NOTE — Progress Notes (Signed)
 [facility] [address] [place], [place] [address]  Internal MEDICINE  Office Visit Note  Patient Name: Stacy Pineda  [id number]  [id number]  Date of Service: 06/27/2024  Chief Complaint  Patient presents with   Follow-up    HPI Pt is here for routine follow up -BP controlled -migraines still problematic, neurology did MRI. Gave her eletriptan and within felt off. States she couldn't get nurtec or ubrelvy . They also tried to get an injection approved too.  -MRI showed paranasal sinus disease and mucous rentention and she does have trouble with sinuses, will refer to ENT -still having cramps, may try magnesium -would like to have GI re-eval at [facility] now for her chronic pancreatic insufficiency that leads to N/V, abdominal pain and unintentional weight loss -Diagnostic mammogram and US  done due to nipple discharge. MRI recommended and pt would like to move forward with this  Current Medication: Outpatient Encounter Medications as of 06/27/2024  Medication Sig   ALPRAZolam  (XANAX ) 0.5 MG tablet Take 0.5 mg by mouth 3 (three) times daily.   amLODipine  (NORVASC ) 10 MG tablet Take 1 tablet (10 mg total) by mouth daily.   Baclofen 5 MG TABS Take 1 tablet by mouth daily as needed.   cyanocobalamin  (VITAMIN B12) 1000 MCG/ML injection INJECT 1ML EVERY 4 WEEKS   hydrochlorothiazide  (HYDRODIURIL ) 12.5 MG tablet Take 1 tablet (12.5 mg total) by mouth daily.   lamoTRIgine (LAMICTAL) 200 MG tablet Take 200 mg by mouth at bedtime.   lisdexamfetamine (VYVANSE) 70 MG capsule Take 70 mg by mouth every morning.   medroxyPROGESTERone  Acetate 150 MG/ML SUSY Inject into the muscle.   Multiple Vitamins-Minerals (MULTIVITAMIN ADULT PO) Take 1 tablet by mouth.   omeprazole  (PRILOSEC) 40 MG capsule Take 1 capsule (40 mg total) by mouth daily before breakfast.   ondansetron  (ZOFRAN -ODT) 4 MG disintegrating tablet Take 1 tablet (4 mg total) by mouth every 8 (eight) hours as needed for  nausea or vomiting.   prazosin  (MINIPRESS ) 5 MG capsule Take 5 mg by mouth at bedtime.   promethazine  (PHENERGAN ) 6.25 MG/5ML solution Take 10 mLs (12.5 mg total) by mouth every 8 (eight) hours as needed for nausea or vomiting. TAKE  10 ML BY MOUTH EVERY 8 HOURS AS NEEDED FOR NAUSEA AND VOMITING OR  REFRACTORY  NAUSEA/VOMITING   Rimegepant Sulfate (NURTEC) 75 MG TBDP Take 1 tablet by mouth every other day for migraine prevention   TRINTELLIX 20 MG TABS tablet Take 20 mg by mouth daily.   [DISCONTINUED] albuterol  (VENTOLIN  HFA) 108 (90 Base) MCG/ACT inhaler Inhale 2 puffs into the lungs every 6 (six) hours as needed for wheezing or shortness of breath.   [DISCONTINUED] AUVELITY 45-105 MG TBCR Take 1 tablet by mouth every morning.   [DISCONTINUED] BELSOMRA 10 MG TABS Take 1 tablet by mouth at bedtime.   [DISCONTINUED] dicyclomine  (BENTYL ) 20 MG tablet Take 1 tablet (20 mg total) by mouth every 8 (eight) hours. As needed   [DISCONTINUED] ergocalciferol  (DRISDOL ) 1.25 MG (50000 UT) capsule Take one cap q week   [DISCONTINUED] folic acid  (FOLVITE ) 1 MG tablet Take 1 tablet (1 mg total) by mouth daily.   [DISCONTINUED] hydrocortisone  (ANUSOL -HC) 2.5 % rectal cream Place 1 Application rectally 2 (two) times daily.   [DISCONTINUED] lisdexamfetamine (VYVANSE) 50 MG capsule Take 50 mg by mouth every morning.   [DISCONTINUED] norethindrone  (MICRONOR ) 0.35 MG tablet Take 1 tablet (0.35 mg total) by mouth daily.   [DISCONTINUED] potassium chloride  (KLOR-CON ) 8 MEQ tablet Take  1 tablet by mouth MWF   [DISCONTINUED] REXULTI 2 MG TABS tablet Take 2 mg by mouth daily.   [DISCONTINUED] Rimegepant Sulfate (NURTEC) 75 MG TBDP Take 1 tablet (75 mg total) by mouth daily as needed.   [DISCONTINUED] varenicline  (CHANTIX  CONTINUING MONTH PAK) 1 MG tablet Take 1 tablet (1 mg total) by mouth 2 (two) times daily.   potassium chloride  (KLOR-CON ) 8 MEQ tablet Take 1 tablet by mouth MWF   No facility-administered encounter  medications on file as of 06/27/2024.    Surgical History: Past Surgical History:  Procedure Laterality Date   ABDOMINAL SURGERY     BREAST BIOPSY Right 04/27/2020   stereo bx, coil clip, path pending    CHOLECYSTECTOMY     COLONOSCOPY WITH PROPOFOL  N/A 09/08/2018   Procedure: COLONOSCOPY WITH PROPOFOL ;  Surgeon: Janalyn Keene NOVAK, MD;  Location: [facility] END[facility];  Service: Endoscopy;  Laterality: N/A;   COLONOSCOPY WITH PROPOFOL  N/A 08/29/2021   Procedure: COLONOSCOPY WITH PROPOFOL ;  Surgeon: Unk Corinn Skiff, MD;  Location: [facility];  Service: Endoscopy;  Laterality: N/A;   DECUBITUS ULCER EXCISION     DILATION AND CURETTAGE OF UTERUS     ESOPHAGOGASTRODUODENOSCOPY (EGD) WITH PROPOFOL  N/A 09/08/2018   Procedure: ESOPHAGOGASTRODUODENOSCOPY (EGD) WITH PROPOFOL ;  Surgeon: Janalyn Keene NOVAK, MD;  Location: [facility] END[facility];  Service: Endoscopy;  Laterality: N/A;   ESOPHAGOGASTRODUODENOSCOPY (EGD) WITH PROPOFOL  N/A 08/29/2021   Procedure: ESOPHAGOGASTRODUODENOSCOPY (EGD) WITH PROPOFOL ;  Surgeon: Unk Corinn Skiff, MD;  Location: [facility];  Service: Endoscopy;  Laterality: N/A;   ESOPHAGOGASTRODUODENOSCOPY (EGD) WITH PROPOFOL  N/A 04/10/2022   Procedure: ESOPHAGOGASTRODUODENOSCOPY (EGD) WITH PROPOFOL ;  Surgeon: Unk Corinn Skiff, MD;  Location: [facility] END[facility];  Service: Gastroenterology;  Laterality: N/A;   GASTRIC BYPASS     GASTRIC BYPASS     REVISION GASTRIC RESTRICTIVE PROCEDURE FOR MORBID OBESITY      Medical History: Past Medical History:  Diagnosis Date   Anxiety    Asthma    Bilateral ovarian cysts    Chronic headaches    migraines about 1x/month   Depression    DVT (deep venous thrombosis) (HCC) 07/2014   right upper extremity   Hypothyroidism    Pancreatitis    Pancreatitis    PTSD (post-traumatic stress disorder)    Wears contact lenses     Family History: Family History  Problem Relation Age of Onset   Graves' disease Mother     Hypertension Mother    Hyperlipidemia Mother    Anxiety disorder Father    Prostate cancer Father    Depression Brother    Anxiety disorder Brother    Anxiety disorder Maternal Aunt    Depression Maternal Aunt    Anxiety disorder Paternal Aunt    Depression Paternal Aunt    Breast cancer Paternal Aunt    Anxiety disorder Maternal Uncle    Depression Maternal Uncle    Anxiety disorder Paternal Uncle    Depression Paternal Uncle    Anxiety disorder Maternal Grandfather    Depression Maternal Grandfather    Diabetes Maternal Grandfather    Anxiety disorder Maternal Grandmother    Depression Maternal Grandmother    Breast cancer Maternal Grandmother    Ovarian cancer Maternal Grandmother    Diabetes Maternal Grandmother    Colon cancer Neg Hx     Social History   Socioeconomic History   Marital status: Single    Spouse name: Not on file   Number of children: Not on file   Years of  education: Not on file   Highest education level: Not on file  Occupational History   Not on file  Tobacco Use   Smoking status: Some Days    Current packs/day: 0.25    Average packs/day: 0.3 packs/day for 19.3 years (4.8 ttl pk-yrs)    Types: Cigarettes    Start date: 03/26/2005   Smokeless tobacco: Never   Tobacco comments:    2 cigarettes daily  Vaping Use   Vaping status: Never Used  Substance and Sexual Activity   Alcohol use: No    Alcohol/week: 0.0 standard drinks of alcohol   Drug use: Yes    Frequency: 3.0 times per week    Types: Marijuana    Comment: last used 2 weeks ago   Sexual activity: Not Currently    Birth control/protection: Injection  Other Topics Concern   Not on file  Social History Narrative   Not on file   Social Drivers of Health   Financial Resource Strain: High Risk (05/19/2024)   Received from [facility]   Overall Financial Resource Strain (CARDIA)    Difficulty of Paying Living Expenses: Hard  Food Insecurity: Food Insecurity  Present (05/19/2024)   Received from [facility]   Hunger Vital Sign    Within the past 12 months, you worried that your food would run out before you got the money to buy more.: Sometimes true    Within the past 12 months, the food you bought just didn't last and you didn't have money to get more.: Sometimes true  Transportation Needs: No Transportation Needs (05/19/2024)   Received from [facility] - Transportation    In the past 12 months, has lack of transportation kept you from medical appointments or from getting medications?: No    Lack of Transportation (Non-Medical): No  Physical Activity: Not on file  Stress: Not on file  Social Connections: Not on file  Intimate Partner Violence: Not on file      Review of Systems  Constitutional:  Negative for chills and fatigue.  HENT:  Positive for postnasal drip. Negative for rhinorrhea, sneezing and sore throat.   Eyes:  Negative for redness.  Respiratory:  Negative for cough, chest tightness and shortness of breath.   Cardiovascular:  Negative for chest pain and palpitations.  Gastrointestinal:  Positive for abdominal pain and nausea. Negative for constipation and diarrhea.  Genitourinary:  Negative for dysuria and frequency.  Musculoskeletal:  Negative for arthralgias, back pain, joint swelling and neck pain.  Skin:  Negative for rash.  Neurological:  Positive for headaches. Negative for tremors and numbness.  Hematological:  Negative for adenopathy. Does not bruise/bleed easily.  Psychiatric/Behavioral:  Negative for behavioral problems (Depression), sleep disturbance and suicidal ideas. The patient is nervous/anxious.     Vital Signs: BP 122/75   Pulse 82   Temp 98 F (36.7 C)   Resp 16   Ht 6' (1.829 m)   Wt 162 lb (73.5 kg)   SpO2 100%   BMI 21.97 kg/m    Physical Exam Vitals and nursing note reviewed.  Constitutional:      General: She is not in acute distress.     Appearance: Normal appearance. She is well-developed. She is not diaphoretic.  HENT:     Head: Normocephalic and atraumatic.  Eyes:     Extraocular Movements: Extraocular movements intact.  Neck:     Thyroid : No thyromegaly.     Vascular: No  JVD.     Trachea: No tracheal deviation.  Cardiovascular:     Rate and Rhythm: Normal rate and regular rhythm.     Heart sounds: Normal heart sounds. No murmur heard.    No friction rub. No gallop.  Pulmonary:     Effort: Pulmonary effort is normal. No respiratory distress.     Breath sounds: No wheezing or rales.  Musculoskeletal:        General: Normal range of motion.  Skin:    General: Skin is warm and dry.  Neurological:     Mental Status: She is alert and oriented to person, place, and time.  Psychiatric:        Behavior: Behavior normal.        Thought Content: Thought content normal.        Judgment: Judgment normal.        Assessment/Plan: 1. Migraine without status migrainosus, not intractable, unspecified migraine type (Primary) Given samples of ubrelvy  and nurtec, will try sending nurtec to take as preventive. Patient is aware of how to take this medication and not to combine within 24 hours. Patient will still follow up with neurology - Rimegepant Sulfate (NURTEC) 75 MG TBDP; Take 1 tablet by mouth every other day for migraine prevention  Dispense: 30 tablet; Refill: 3  2. Hypokalemia - potassium chloride  (KLOR-CON ) 8 MEQ tablet; Take 1 tablet by mouth MWF  Dispense: 30 tablet; Refill: 2  3. Mucous cyst of nasal sinus Will refer to ENT - Ambulatory referral to ENT  4. Paranasal sinus disease - Ambulatory referral to ENT  5. Pancreatic insufficiency Patient would like refer for re-evaluation of chronic problem - Ambulatory referral to Gastroenterology  6. Weight loss, abnormal Patient would like refer for re-evaluation of chronic problem - Ambulatory referral to Gastroenterology  7. Nausea and vomiting,  unspecified vomiting type Patient would like refer for re-evaluation of chronic problem - Ambulatory referral to Gastroenterology  8. Nipple discharge in female Diagnostic mammogram and US  did not reveal any findings to explain discharge and recommended MRI - MR BREAST BILATERAL W WO CONTRAST INC CAD; Future  9. Essential hypertension Stable, continue current medications   General Counseling: Chae verbalizes understanding of the findings of todays visit and agrees with plan of treatment. I have discussed any further diagnostic evaluation that may be needed or ordered today. We also reviewed her medications today. she has been encouraged to call the office with any questions or concerns that should arise related to todays visit.    Orders Placed This Encounter  Procedures   MR BREAST BILATERAL W WO CONTRAST INC CAD   Ambulatory referral to ENT   Ambulatory referral to Gastroenterology    Meds ordered this encounter  Medications   Rimegepant Sulfate (NURTEC) 75 MG TBDP    Sig: Take 1 tablet by mouth every other day for migraine prevention    Dispense:  30 tablet    Refill:  3   potassium chloride  (KLOR-CON ) 8 MEQ tablet    Sig: Take 1 tablet by mouth MWF    Dispense:  30 tablet    Refill:  2    This patient was seen by Tinnie Pro, PA-C in collaboration with Dr. Sigrid Bathe as a part of collaborative care agreement.   Total time spent:35 Minutes Time spent includes review of chart, medications, test results, and follow up plan with the patient.      Dr Fozia M Khan Internal medicine

## 2024-07-05 ENCOUNTER — Telehealth: Payer: Self-pay | Admitting: Physician Assistant

## 2024-07-05 NOTE — Telephone Encounter (Signed)
 Awaiting 06/27/24 office notes for GI and Otolaryngology referral-Toni

## 2024-07-12 ENCOUNTER — Telehealth: Payer: Self-pay | Admitting: Physician Assistant

## 2024-07-12 NOTE — Telephone Encounter (Addendum)
 Otolaryngology referral sent via [facility] to [facility]. Notified patient. Gave patient telephone # [phone]   Gastroenterology referral faxed to [facility] per patient request; [phone]

## 2024-07-20 DIAGNOSIS — F410 Panic disorder [episodic paroxysmal anxiety] without agoraphobia: Secondary | ICD-10-CM | POA: Diagnosis not present

## 2024-07-20 DIAGNOSIS — F4312 Post-traumatic stress disorder, chronic: Secondary | ICD-10-CM | POA: Diagnosis not present

## 2024-07-20 DIAGNOSIS — F331 Major depressive disorder, recurrent, moderate: Secondary | ICD-10-CM | POA: Diagnosis not present

## 2024-07-21 DIAGNOSIS — F331 Major depressive disorder, recurrent, moderate: Secondary | ICD-10-CM | POA: Diagnosis not present

## 2024-07-21 DIAGNOSIS — F4001 Agoraphobia with panic disorder: Secondary | ICD-10-CM | POA: Diagnosis not present

## 2024-07-22 ENCOUNTER — Telehealth: Payer: Self-pay | Admitting: Physician Assistant

## 2024-07-22 NOTE — Telephone Encounter (Signed)
 Otolaryngology appointment 08/17/2024 with [facility]

## 2024-08-10 DIAGNOSIS — F331 Major depressive disorder, recurrent, moderate: Secondary | ICD-10-CM | POA: Diagnosis not present

## 2024-08-10 DIAGNOSIS — F410 Panic disorder [episodic paroxysmal anxiety] without agoraphobia: Secondary | ICD-10-CM | POA: Diagnosis not present

## 2024-08-16 ENCOUNTER — Encounter: Payer: Self-pay | Admitting: Oncology

## 2024-08-18 ENCOUNTER — Other Ambulatory Visit: Payer: Self-pay | Admitting: Physician Assistant

## 2024-08-18 DIAGNOSIS — I10 Essential (primary) hypertension: Secondary | ICD-10-CM

## 2025-02-02 ENCOUNTER — Ambulatory Visit: Admitting: Physician Assistant
